# Patient Record
Sex: Male | Born: 1945
Health system: Southern US, Community
[De-identification: ages and names within clinical notes are randomized; demographics above are authoritative.]

## PROBLEM LIST (undated history)

## (undated) DIAGNOSIS — M1990 Unspecified osteoarthritis, unspecified site: Secondary | ICD-10-CM

## (undated) DIAGNOSIS — N183 Chronic kidney disease, stage 3 unspecified: Secondary | ICD-10-CM

## (undated) DIAGNOSIS — G709 Myoneural disorder, unspecified: Secondary | ICD-10-CM

## (undated) DIAGNOSIS — I639 Cerebral infarction, unspecified: Secondary | ICD-10-CM

## (undated) DIAGNOSIS — I2510 Atherosclerotic heart disease of native coronary artery without angina pectoris: Secondary | ICD-10-CM

## (undated) DIAGNOSIS — I219 Acute myocardial infarction, unspecified: Secondary | ICD-10-CM

## (undated) HISTORY — DX: Chronic kidney disease, stage 3 unspecified: N18.30

## (undated) HISTORY — DX: Atherosclerotic heart disease of native coronary artery without angina pectoris: I25.10

## (undated) HISTORY — DX: Cerebral infarction, unspecified: I63.9

---

## 2007-07-15 ENCOUNTER — Emergency Department (HOSPITAL_COMMUNITY): Admission: EM | Admit: 2007-07-15 | Discharge: 2007-07-15 | Payer: Self-pay | Admitting: Emergency Medicine

## 2007-07-16 ENCOUNTER — Emergency Department (HOSPITAL_COMMUNITY): Admission: EM | Admit: 2007-07-16 | Discharge: 2007-07-16 | Payer: Self-pay | Admitting: Emergency Medicine

## 2010-01-15 ENCOUNTER — Emergency Department (HOSPITAL_BASED_OUTPATIENT_CLINIC_OR_DEPARTMENT_OTHER): Admission: EM | Admit: 2010-01-15 | Discharge: 2010-01-15 | Payer: Self-pay | Admitting: Emergency Medicine

## 2011-06-25 ENCOUNTER — Emergency Department (HOSPITAL_COMMUNITY)
Admission: EM | Admit: 2011-06-25 | Discharge: 2011-06-25 | Disposition: A | Discharge status: Home / Self Care | Payer: BC Managed Care – PPO | Attending: Emergency Medicine | Admitting: Emergency Medicine

## 2011-06-25 ENCOUNTER — Emergency Department (HOSPITAL_COMMUNITY): Payer: BC Managed Care – PPO

## 2011-06-25 DIAGNOSIS — R630 Anorexia: Secondary | ICD-10-CM | POA: Insufficient documentation

## 2011-06-25 DIAGNOSIS — R6883 Chills (without fever): Secondary | ICD-10-CM | POA: Insufficient documentation

## 2011-06-25 DIAGNOSIS — N23 Unspecified renal colic: Secondary | ICD-10-CM | POA: Insufficient documentation

## 2011-06-25 DIAGNOSIS — R1032 Left lower quadrant pain: Secondary | ICD-10-CM | POA: Insufficient documentation

## 2011-06-25 DIAGNOSIS — R112 Nausea with vomiting, unspecified: Secondary | ICD-10-CM | POA: Insufficient documentation

## 2011-06-25 DIAGNOSIS — N1330 Unspecified hydronephrosis: Secondary | ICD-10-CM | POA: Insufficient documentation

## 2011-06-25 DIAGNOSIS — M549 Dorsalgia, unspecified: Secondary | ICD-10-CM | POA: Insufficient documentation

## 2011-06-25 LAB — POCT I-STAT, CHEM 8
BUN: 26 mg/dL — ABNORMAL HIGH (ref 6–23)
Glucose, Bld: 121 mg/dL — ABNORMAL HIGH (ref 70–99)
HCT: 44 % (ref 39.0–52.0)
Hemoglobin: 15 g/dL (ref 13.0–17.0)
Potassium: 4.3 mEq/L (ref 3.5–5.1)
Sodium: 137 mEq/L (ref 135–145)

## 2011-06-25 LAB — DIFFERENTIAL
Basophils Absolute: 0 10*3/uL (ref 0.0–0.1)
Eosinophils Absolute: 0 10*3/uL (ref 0.0–0.7)
Lymphs Abs: 1 10*3/uL (ref 0.7–4.0)
Monocytes Absolute: 1.1 10*3/uL — ABNORMAL HIGH (ref 0.1–1.0)
Monocytes Relative: 9 % (ref 3–12)

## 2011-06-25 LAB — CBC
HCT: 41.8 % (ref 39.0–52.0)
Hemoglobin: 14.7 g/dL (ref 13.0–17.0)
MCV: 85.5 fL (ref 78.0–100.0)
Platelets: 164 10*3/uL (ref 150–400)
RDW: 12.5 % (ref 11.5–15.5)
WBC: 11.6 10*3/uL — ABNORMAL HIGH (ref 4.0–10.5)

## 2011-06-25 LAB — URINALYSIS, ROUTINE W REFLEX MICROSCOPIC
Glucose, UA: NEGATIVE mg/dL
Ketones, ur: NEGATIVE mg/dL
Protein, ur: 30 mg/dL — AB
Specific Gravity, Urine: 1.016 (ref 1.005–1.030)
pH: 5.5 (ref 5.0–8.0)

## 2011-06-25 LAB — URINE MICROSCOPIC-ADD ON

## 2011-07-11 LAB — CULTURE, BLOOD (ROUTINE X 2): Culture: NO GROWTH

## 2011-07-11 LAB — DIFFERENTIAL
Basophils Absolute: 0
Basophils Relative: 0
Eosinophils Absolute: 0
Eosinophils Relative: 0
Monocytes Relative: 11
Neutro Abs: 11.1 — ABNORMAL HIGH

## 2011-07-11 LAB — COMPREHENSIVE METABOLIC PANEL
ALT: 19
AST: 19
Albumin: 3.5
CO2: 28
Chloride: 97
Creatinine, Ser: 1.22
GFR calc non Af Amer: >60
Glucose, Bld: 156 — ABNORMAL HIGH

## 2011-07-11 LAB — CBC
HCT: 42.3
MCHC: 34.3
WBC: 13.8 — ABNORMAL HIGH

## 2011-07-12 LAB — DIFFERENTIAL
Basophils Absolute: 0
Basophils Relative: 0
Eosinophils Absolute: 0.1
Eosinophils Relative: 1
Lymphocytes Relative: 7 — ABNORMAL LOW
Lymphs Abs: 0.8
Monocytes Absolute: 1 — ABNORMAL HIGH
Monocytes Relative: 9
Neutrophils Relative %: 84 — ABNORMAL HIGH

## 2011-07-12 LAB — CBC
HCT: 46.4
Hemoglobin: 15.9
RBC: 5.36
RDW: 12.5
WBC: 11.2 — ABNORMAL HIGH

## 2017-09-16 ENCOUNTER — Ambulatory Visit (INDEPENDENT_AMBULATORY_CARE_PROVIDER_SITE_OTHER): Payer: BLUE CROSS/BLUE SHIELD | Admitting: Family Medicine

## 2017-09-16 ENCOUNTER — Encounter: Payer: Self-pay | Admitting: Family Medicine

## 2017-09-16 VITALS — BP 128/80 | Temp 97.7°F | Ht 67.0 in | Wt 174.4 lb

## 2017-09-16 DIAGNOSIS — Z23 Encounter for immunization: Secondary | ICD-10-CM | POA: Diagnosis not present

## 2017-09-16 DIAGNOSIS — G540 Brachial plexus disorders: Secondary | ICD-10-CM

## 2017-09-16 NOTE — Progress Notes (Signed)
Subjective:  Patient ID: Ian Ramos, male    DOB: July 04, 1946  Age: 71 y.o. MRN: [id number]  CC: Establish Care   HPI Ian Ramos presents for 1 mos ho weakness in his left hand. "I cannot tell which way my fingers are going and my hand gets cold. All of my fingers are cold."  Patient feels numbness and tingling in all fingers of his left hand.  Patient is right-hand dominant.  He denies an injury to his neck shoulder arm or hand.  He denies neck or shoulder pain.  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  He does heavy work in both places.  He is accompanied by his girlfriend.  He has not seen a doctor in many years.  Chart review shows that he had a forniceal rupture many years ago that was documented by CT scan.  History of renal lithiasis.  History Jama has no past medical history on Ramos.   He has no past surgical history on Ramos.   His family history is not on Ramos.He reports that  has never smoked. His smokeless tobacco use includes chew. He reports that he does not drink alcohol or use drugs.  No outpatient medications prior to visit.   No facility-administered medications prior to visit.     ROS Review of Systems  Constitutional: Negative for chills, fatigue and fever.  HENT: Negative.   Eyes: Negative for photophobia and visual disturbance.  Respiratory: Negative for cough, chest tightness and shortness of breath.   Cardiovascular: Negative for chest pain and palpitations.  Gastrointestinal: Negative.   Endocrine: Negative for polyphagia and polyuria.  Genitourinary: Negative for decreased urine volume, frequency and hematuria.  Musculoskeletal: Negative for arthralgias, back pain, myalgias, neck pain and neck stiffness.  Skin: Negative.  Negative for rash.  Neurological: Positive for weakness and numbness. Negative for dizziness, facial asymmetry, light-headedness and headaches.    Hematological: Does not bruise/bleed easily.    Objective:  BP 128/80 (BP Location: Left Arm, Patient Position: Sitting, Cuff Size: Normal)   Temp 97.7 F (36.5 C) (Oral)   Ht 5\' 7"  (1.702 m)   Wt 174 lb 6 oz (79.1 kg)   SpO2 100%   BMI 27.31 kg/m   Physical Exam  Constitutional: He is oriented to person, place, and time. He appears well-developed and well-nourished. No distress.  HENT:  Head: Normocephalic and atraumatic.  Right Ear: External ear normal.  Left Ear: External ear normal.  Mouth/Throat: Oropharynx is clear and moist.  Eyes: Conjunctivae and EOM are normal. Pupils are equal, round, and reactive to light. Right eye exhibits no discharge. Left eye exhibits no discharge. No scleral icterus.  Neck: Neck supple. No JVD present. No tracheal deviation present. No thyromegaly present.  Cardiovascular: Normal rate, regular rhythm and normal heart sounds.  Pulses:      Radial pulses are 2+ on the left side.  Pulmonary/Chest: Effort normal and breath sounds normal. No stridor.  Musculoskeletal:       Right shoulder: Normal.       Left shoulder: He exhibits decreased range of motion. He exhibits no tenderness, no bony tenderness, no swelling and no deformity.       Cervical back: He exhibits normal range of motion, no tenderness, no bony tenderness and no deformity.       Arms:      Left hand: Decreased strength  noted. He exhibits finger abduction, thumb/finger opposition and wrist extension trouble.  Lymphadenopathy:    He has no cervical adenopathy.  Neurological: He is alert and oriented to person, place, and time.  Skin: Skin is warm and dry. He is not diaphoretic.  Psychiatric: He has a normal mood and affect. His behavior is normal.      Assessment & Plan:   Ian Ramos for establish care.  Diagnoses and all orders for this visit:  Need for influenza vaccination -     Flu vaccine HIGH DOSE PF  Need for pneumococcal vaccination -     Pneumococcal  polysaccharide vaccine 23-valent greater than or equal to 2yo subcutaneous/IM  Brachial plexopathy -     Ambulatory referral to Sports Medicine   Ian Ramos.  No orders of the defined types were placed in this encounter.    Follow-up: No Follow-up on Ramos.  Ian SaxWilliam Alfred Kremer, MD

## 2017-09-20 ENCOUNTER — Ambulatory Visit (INDEPENDENT_AMBULATORY_CARE_PROVIDER_SITE_OTHER): Payer: BLUE CROSS/BLUE SHIELD | Admitting: Family Medicine

## 2017-09-20 ENCOUNTER — Encounter: Payer: Self-pay | Admitting: Family Medicine

## 2017-09-20 ENCOUNTER — Ambulatory Visit (INDEPENDENT_AMBULATORY_CARE_PROVIDER_SITE_OTHER): Payer: BLUE CROSS/BLUE SHIELD

## 2017-09-20 VITALS — BP 128/72 | HR 66 | Temp 98.3°F | Ht 67.0 in | Wt 174.0 lb

## 2017-09-20 DIAGNOSIS — R29898 Other symptoms and signs involving the musculoskeletal system: Secondary | ICD-10-CM | POA: Diagnosis not present

## 2017-09-20 DIAGNOSIS — M19012 Primary osteoarthritis, left shoulder: Secondary | ICD-10-CM | POA: Diagnosis not present

## 2017-09-20 MED ORDER — PREDNISONE 5 MG PO TABS: ORAL_TABLET | ORAL | 0 refills | Status: DC

## 2017-09-20 NOTE — Assessment & Plan Note (Signed)
Unclear if he has brachial plexus problem or more central location. Doesn't appear to be a ganglion cyst anywhere on [place]. He has no pain and had no injury as a source. Rotator cuff is intact on [place]. Thoracic outlet syndrome would be odd but possible.  - XRay  - prednisone  - nerve conduction study  - if unrevealing then would consider either an MRI of cervical spine or of the brachial plexus  - may need to consider ESR, CRP, HIV, lyme titers, a1c

## 2017-09-20 NOTE — Patient Instructions (Signed)
Please see how the prednisone and let me know how it does for you  I will see what the nerve conduction study shows and we'll go from there.

## 2017-09-20 NOTE — Progress Notes (Signed)
Ian Ramos - 71 y.o. male MRN [id number]  Date of birth: 15-Dec-1945  SUBJECTIVE:  Including CC & ROS.  Chief Complaint  Patient presents with  . Left hand weakness    Ian Ramos is a 71 y.o. male that is presenting with left hand and arm weakness. Has been ongoing for two months. Denies previous injury or surgeries to the area. Admits to decrease range of motion in his arm. Has been having difficulties grasping objects in his hand. His symptoms have seemed to stay the same as of late. 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 There is no pain. His symptoms started with an abnormal sensation in his first finger and thumb where he had weakness with his pinching grasp. Denies any family history of autoimmune disease. Does not take any medications regularly. He cannot abduct or flex his arm. He denies any neck problems. No trouble breathing. No chest surgeries.    Review of Systems  Constitutional: Negative for fever.  Respiratory: Negative for shortness of breath.   Cardiovascular: Negative for chest pain.  Musculoskeletal: Negative for gait problem and myalgias.  Skin: Negative for color change.  Neurological: Positive for weakness and numbness.  Hematological: Negative for adenopathy.  Psychiatric/Behavioral: Negative for agitation.    HISTORY: Past Medical, Surgical, Social, and Family History Reviewed & Updated per EMR.   Pertinent Historical Findings include:  No past medical history on file.  No past surgical history on file.  No Known Allergies  No family history on file.   Social History   Socioeconomic History  . Marital status: Divorced    Spouse name: Not on file  . Number of children: Not on file  . Years of education: Not on file  . Highest education level: Not on file  Social Needs  . Financial resource strain: Not on file  . Food insecurity - worry: Not on file  . Food insecurity - inability: Not on  file  . Transportation needs - medical: Not on file  . Transportation needs - non-medical: Not on file  Occupational History  . Not on file  Tobacco Use  . Smoking status: Never Smoker  . Smokeless tobacco: Current User    Types: Chew  Substance and Sexual Activity  . Alcohol use: No    Frequency: Never  . Drug use: No  . Sexual activity: Not on file  Other Topics Concern  . Not on file  Social History Narrative  . Not on file     PHYSICAL EXAM:  VS: BP 128/72 (BP Location: Right Arm, Patient Position: Sitting, Cuff Size: Normal)   Pulse 66   Temp 98.3 F (36.8 C) (Oral)   Ht _0  (1.702 m)   Wt 174 lb (78.9 kg)   SpO2 100%   BMI 27.25 kg/m  Physical Exam Gen: NAD, alert, cooperative with exam, well-appearing ENT: normal lips, normal nasal mucosa,  Eye: normal EOM, normal conjunctiva and lids CV:  no edema, +2 pedal pulses   Resp: no accessory muscle use, non-labored,  GI: no masses or tenderness, no hernia  Skin: no rashes, no areas of induration  Neuro: normal tone, normal sensation to touch Psych:  normal insight, alert and oriented MSK:  Left shoulder:  Unable to actively abduct or flex the shoulder past 30 degrees Passively able to achieve full abduction and flexion  Normal ER  Weakness with IR and ER to resistance  Unable to  hold his arm at 90 degrees to test rotator cuff  No area that is tender to palpation  Weakness with grip strength and pincer grasp No signs of atrophy  Normal elbow ROM  Normal strength to resistance with elbow flexion and extension   Limited ultrasound: left shoulder:  No signs of a ganglion in the suprascapular notch  No abnormal mass observed in the axilla  Normal supraspinatus  Subscapularis with calcification but no tear   Summary: chronic changes of humeral head but calcification of subscap  Ultrasound and interpretation by Clearance Coots, MD          ASSESSMENT & PLAN:   Left arm weakness Unclear if he has  brachial plexus problem or more central location. Doesn't appear to be a ganglion cyst anywhere on [place]. He has no pain and had no injury as a source. Rotator cuff is intact on [place]. Thoracic outlet syndrome would be odd but possible.  - XRay  - prednisone  - nerve conduction study  - if unrevealing then would consider either an MRI of cervical spine or of the brachial plexus  - may need to consider ESR, CRP, HIV, lyme titers, a1c

## 2017-09-23 ENCOUNTER — Telehealth: Payer: Self-pay | Admitting: Family Medicine

## 2017-09-23 NOTE — Telephone Encounter (Signed)
Left VM for patient. If he calls back please have him speak with a nurse/CMA and inform that his xray results are normal.   If any questions then please take the best time and phone number to call and I will try to call him back.   Myra Rude, MD [facility] 09/23/2017, 8:39 AM

## 2017-10-03 ENCOUNTER — Telehealth: Payer: Self-pay | Admitting: Family Medicine

## 2017-10-03 NOTE — Telephone Encounter (Signed)
Copied from CRM #[id number]. Topic: Referral - Status >> Oct 03, 2017 11:41 AM Stephannie Li, NT wrote: Reason for CRM: Patient called to follow for status of referral ,they waiting for a return call from Diane in regards , please advise, [phone]  >> Oct 03, 2017  1:14 PM Marcha Solders D wrote: Returned call and informed patient referral was sent to [facility] and is being reviewed by there office. Informed they will call patient directly to schedule appointment. I also gave phone number for [facility].

## 2017-10-10 ENCOUNTER — Ambulatory Visit (INDEPENDENT_AMBULATORY_CARE_PROVIDER_SITE_OTHER): Payer: BLUE CROSS/BLUE SHIELD | Admitting: Diagnostic Neuroimaging

## 2017-10-10 ENCOUNTER — Encounter (INDEPENDENT_AMBULATORY_CARE_PROVIDER_SITE_OTHER): Payer: BLUE CROSS/BLUE SHIELD | Admitting: Diagnostic Neuroimaging

## 2017-10-10 DIAGNOSIS — R29898 Other symptoms and signs involving the musculoskeletal system: Secondary | ICD-10-CM

## 2017-10-10 DIAGNOSIS — Z0289 Encounter for other administrative examinations: Secondary | ICD-10-CM

## 2017-10-11 NOTE — Procedures (Signed)
[facility]  NCS (NERVE CONDUCTION STUDY) WITH EMG (ELECTROMYOGRAPHY) REPORT   STUDY DATE: 10/10/17 PATIENT NAME: Ian Ramos DOB: Feb 18, 1946 MRN: [id number]  ORDERING CLINICIAN: Daphene Jaeger, MD  TECHNOLOGIST: Charlesetta Ivory ELECTROMYOGRAPHER: Glenford Bayley. Penumalli, MD  CLINICAL INFORMATION: 72 year old male with left arm weakness and left hand numbness for past 3 months.  Patient notes weakness in raising his arm overhead.  He is also having left grip weakness.  Also numbness and tingling in his fingertips.  Neurologic exam notable for weakness in left deltoid 3/5, left triceps 3/5, left hand finger abduction 3/5, with fasciculations in left triceps.  Upper extremity reflexes are 2+ except 3+ at left triceps.  Lower extremity reflexes 1+.  Sensory exam notable for slightly decreased sensation in left hand.  Evaluate for brachial plexopathy, cervical radiculopathy or peripheral neuropathy.   FINDINGS: NERVE CONDUCTION STUDY: Left median motor response has prolonged distal latency (6.0 ms), decreased amplitude, normal conduction velocity.  Right median motor response has prolonged distal latency (7.2 ms) decreased amplitude, slightly slow conduction velocity.  Right ulnar motor response and F wave latency normal.  Left ulnar motor response notable for normal distal latency, mildly decreased amplitude, slow conduction velocity with stimulation above the elbow (43 m/s) normal conduction velocity with stim elation below the elbow (55 m/s).  Right median sensory response has prolonged peak latency and decreased amplitude.  Left median sensory response could not be obtained.  Bilateral ulnar sensory responses are normal.   NEEDLE ELECTROMYOGRAPHY:  Needle examination of left upper extremity notable for left deltoid and left infraspinatus muscles have no abnormal spontaneous activity at rest and decreased recruitment of large motor units on exertion.  Left cervical  paraspinal muscles, left biceps, left triceps, left flexor carpi radialis, left first dorsal interosseous and left supraspinatus are unremarkable.   IMPRESSION:   Abnormal study demonstrating: 1.  Chronic denervation in left deltoid and left infraspinatus muscles.  Remainder of nerve conduction study and needle EMG did not further localize.  Could be related to underlying cervical radiculopathy (C5, C6) or upper trunk brachial plexopathy.  Consider follow-up imaging with MRI of cervical spine and brachial plexus. 2.  Bilateral median neuropathies at the wrist consistent with bilateral carpal tunnel syndrome.  This is worse on the left compared to right side.    INTERPRETING PHYSICIAN:  Suanne Marker, MD Certified in Neurology, Neurophysiology and Neuroimaging  [facility] [address] [place], [place] [address] [phone]  [facility]    Nerve / Sites Muscle Latency Ref. Amplitude Ref. Rel Amp Segments Distance Velocity Ref. Area    ms ms mV mV %  cm m/s m/s mVms  R Median - APB     Wrist APB 7.2 ?4.4 2.0 ?4.0 100 Wrist - APB 7   8.0     Upper arm APB 11.4  1.9  91 Upper arm - Wrist 20 47 ?49 7.1  L Median - APB     Wrist APB 6.0 ?4.4 2.1 ?4.0 100 Wrist - APB 7   7.6     Upper arm APB 9.9  2.0  94.1 Upper arm - Wrist 20 51 ?49 7.3  R Ulnar - ADM     Wrist ADM 3.0 ?3.3 7.3 ?6.0 100 Wrist - ADM 7   17.5     B.Elbow ADM 5.6  6.7  91.5 B.Elbow - Wrist 16 60 ?49 16.7     A.Elbow ADM 7.9  6.7  100 A.Elbow - B.Elbow  12 52 ?49 17.5         A.Elbow - Wrist      L Ulnar - ADM     Wrist ADM 2.7 ?3.3 5.6 ?6.0 100 Wrist - ADM 7   10.9     B.Elbow ADM 6.0  5.8  104 B.Elbow - Wrist 18 55 ?49 14.7     A.Elbow ADM 8.3  4.9  84.2 A.Elbow - B.Elbow 10 43 ?49 13.6         A.Elbow - Wrist                 SNC    Nerve / Sites Rec. Site Peak Lat Ref.  Amp Ref. Segments Distance    ms ms V V  cm  R Median - Orthodromic (Dig II, Mid palm)     Dig II Wrist 4.8 ?3.4 3  ?10 Dig II - Wrist 13  L Median - Orthodromic (Dig II, Mid palm)     Dig II Wrist NR ?3.4 NR ?10 Dig II - Wrist 13  R Ulnar - Orthodromic, (Dig V, Mid palm)     Dig V Wrist 2.7 ?3.1 6 ?5 Dig V - Wrist 11  L Ulnar - Orthodromic, (Dig V, Mid palm)     Dig V Wrist 2.3 ?3.1 10 ?5 Dig V - Wrist 34              F  Wave    Nerve F Lat Ref.   ms ms  R Ulnar - ADM 28.6 ?32.0  L Ulnar - ADM 30.3 ?32.0         EMG full       EMG Summary Table    Spontaneous MUAP Recruitment  Muscle IA Fib PSW Fasc Other Amp Dur. Poly Pattern  L. Biceps brachii Normal None None None _______ Normal Normal Normal Normal  L. Triceps brachii Normal None None None _______ Normal Normal Normal Normal  L. Flexor carpi radialis Normal None None None _______ Normal Normal Normal Normal  L. First dorsal interosseous Normal None None None _______ Normal Normal Normal Normal  L. Deltoid Normal None None None _______ Increased Normal Normal Reduced  L. Supraspinatus Normal None None None _______ Normal Normal Normal Normal  L. Infraspinatus Normal None None None _______ Increased Normal Normal Reduced  L. Cervical paraspinals Normal None None None _______ Normal Normal Normal Normal

## 2017-10-14 ENCOUNTER — Telehealth: Payer: Self-pay | Admitting: Family Medicine

## 2017-10-14 NOTE — Telephone Encounter (Signed)
Left VM for patient. If he calls back please have him speak with a nurse/CMA and inform that his EMG suggested there could be a problem in his neck or at the brachial plexus. We could get an MRI to help with this.   If any questions then please take the best time and phone number to call and I will try to call him back.   Myra Rude, MD [facility] 10/14/2017, 8:46 AM

## 2017-10-14 NOTE — Telephone Encounter (Signed)
-----   Message from Suanne Marker, MD sent at 10/11/2017  4:53 PM EST ----- Thank you for referring this interesting and pleasant patient.  I have included my electrodiagnostic report and please feel free to contact me with any questions or concerns.  Sincerely,  Suanne Marker, MD 10/11/2017, 4:53 PM Certified in Neurology, Neurophysiology and Neuroimaging  [facility] [address] [place], [place] [address] [phone]

## 2017-10-17 NOTE — Telephone Encounter (Addendum)
Caller name: Melida Quitter Relation to pt: spouse  Call back number: [phone]  Reason for call:  Wife checking on the status of MRI orders, please advise location and when orders are placed.

## 2017-10-22 ENCOUNTER — Other Ambulatory Visit: Payer: Self-pay | Admitting: Family Medicine

## 2017-10-22 NOTE — Telephone Encounter (Signed)
Spoke with patient's wife about EMG results and MRI. Will order a MRI brachial plexus with and without to evaluate for possible thoracic outlet vs brachial plexus plexopathy.   Myra Rude, MD [facility] 10/22/2017, 5:23 PM

## 2017-10-22 NOTE — Telephone Encounter (Signed)
Wife of pt called to check the status of the mri, pt's arm is worsening, contact to advise if needed, call back [phone]

## 2017-10-23 ENCOUNTER — Other Ambulatory Visit: Payer: Self-pay | Admitting: Family Medicine

## 2017-10-23 DIAGNOSIS — G540 Brachial plexus disorders: Secondary | ICD-10-CM

## 2017-11-07 ENCOUNTER — Telehealth: Payer: Self-pay | Admitting: Family Medicine

## 2017-11-07 NOTE — Telephone Encounter (Signed)
Ian Ramos called request to know more information CT chest. She wants to know why CT chest was order when the pt is having shoulder and neck issue. Please call Ian Ramos back. [phone]

## 2017-11-07 NOTE — Telephone Encounter (Signed)
Spoke with patient's wife, notified the reason for the MRI and location. Verbalized understanding.

## 2017-11-14 ENCOUNTER — Telehealth: Payer: Self-pay | Admitting: Family Medicine

## 2017-11-14 ENCOUNTER — Ambulatory Visit
Admission: RE | Admit: 2017-11-14 | Discharge: 2017-11-14 | Disposition: A | Discharge status: Home / Self Care | Payer: BLUE CROSS/BLUE SHIELD | Source: Ambulatory Visit | Attending: Family Medicine | Admitting: Family Medicine

## 2017-11-14 DIAGNOSIS — G540 Brachial plexus disorders: Secondary | ICD-10-CM

## 2017-11-14 NOTE — Telephone Encounter (Signed)
Copied from CRM #[id number]. Topic: Quick Communication - See Telephone Encounter >> Nov 14, 2017  4:39 PM Arlyss Gandy, NT wrote: CRM for notification. See Telephone encounter for: Pt went to have MRI done and was unable to do so do to being so claustrophobic with the thing on his face and he felt as if he couldn't breathe. The MRI imaging place told them to contact Dr. Jordan Likes to see if he could order something for him to take prior to the MRI to help. Please advise. Pt states that the doctor may speak with Kendal Hymen.   11/14/17.

## 2017-11-18 MED ORDER — DIAZEPAM 5 MG PO TABS: ORAL_TABLET | ORAL | 0 refills | Status: DC

## 2017-11-18 NOTE — Telephone Encounter (Signed)
Spoke with patient's wife about his inability to tolerate the MRI. We will try Valium for this. If unable to tolerate with Valium the knee may need to be scheduled in an open MRI.  Myra Rude, MD [facility] 11/18/2017, 10:28 AM

## 2017-11-19 ENCOUNTER — Other Ambulatory Visit: Payer: Self-pay | Admitting: Family Medicine

## 2017-11-19 DIAGNOSIS — G540 Brachial plexus disorders: Secondary | ICD-10-CM

## 2017-11-24 ENCOUNTER — Other Ambulatory Visit: Payer: BLUE CROSS/BLUE SHIELD

## 2017-11-24 ENCOUNTER — Ambulatory Visit
Admission: RE | Admit: 2017-11-24 | Discharge: 2017-11-24 | Disposition: A | Discharge status: Home / Self Care | Payer: BLUE CROSS/BLUE SHIELD | Source: Ambulatory Visit | Attending: Family Medicine | Admitting: Family Medicine

## 2017-11-24 DIAGNOSIS — G540 Brachial plexus disorders: Secondary | ICD-10-CM

## 2017-11-29 ENCOUNTER — Telehealth: Payer: Self-pay | Admitting: *Deleted

## 2017-11-29 DIAGNOSIS — G540 Brachial plexus disorders: Secondary | ICD-10-CM

## 2017-11-29 NOTE — Telephone Encounter (Signed)
Copied from CRM #[id number]. Topic: Quick Communication - See Telephone Encounter >> Nov 14, 2017  4:39 PM Arlyss Gandy, NT wrote: CRM for notification. See Telephone encounter for: Pt went to have MRI done and was unable to do so do to being so claustrophobic with the thing on his face and he felt as if he couldn't breathe. The MRI imaging place told them to contact Dr. Jordan Likes to see if he could order something for him to take prior to the MRI to help. Please advise. Pt states that the doctor may speak with Kendal Hymen.   11/14/17. >> Nov 28, 2017  5:05 PM Floria Raveling A wrote: Pt wife called in and said that pt was give meds to help with MRI but they did not do anything.  Pt was unable to do the MRI again on the 11/24/2017.  She wants to know what is the next step to get this MRI done  Best number [phone]

## 2017-12-02 NOTE — Telephone Encounter (Signed)
Spoke with patient's wife about not being able to complete an MRI. Tried valium and was unsuccessful. Will refer to neurosurgery and they can work up if they believe his symptoms are more neck related or brachial plexus in origin.   Myra Rude, MD [facility] 12/02/2017, 1:44 PM

## 2017-12-06 NOTE — Telephone Encounter (Signed)
°  Pt. Called about referral. Asked to re-send to [facility].  Wife is wanting to get appt. Scheduled.

## 2017-12-09 ENCOUNTER — Telehealth: Payer: Self-pay | Admitting: Family Medicine

## 2017-12-09 NOTE — Telephone Encounter (Signed)
I returned call to [facility]. I was informed per there provider patient needs to have MRI before proceeding with referral. I informed her that patient has tried on several occasions to have MRI done and was unable to have it done even with medication. I was informed patient may need to have it done at [facility] under sedation. I have no knowledge of this being done for a MRI. Please advise.

## 2017-12-09 NOTE — Telephone Encounter (Signed)
Copied from CRM [id number]. Topic: General - Other >> Dec 09, 2017  3:23 PM Gerrianne Scale wrote: Reason for CRM: Rene Kocher from [facility] [phone] calling stating that she need Ian Ramos to give her a call about a referral she states that the Doctor want patient to have a MRI of cervical spine she asked that when you call press 0 and ask for her

## 2017-12-12 NOTE — Telephone Encounter (Signed)
Received phone call from [facility], calling to find out the status about MRI. Dr. Jordan Likes has agreed to see patient, but is requesting patient get MRI done before coming into office. Please advise.

## 2017-12-13 NOTE — Telephone Encounter (Signed)
Spoke with patient's wife about next course of action. Will order MRI cervical spine without contrast and an MRI chest with and without contrast with focus on the brachial plexus. He will need to be sedated for this to be accomplished.   Myra Rude, MD [facility] 12/13/2017, 3:26 PM

## 2017-12-16 ENCOUNTER — Other Ambulatory Visit: Payer: Self-pay | Admitting: *Deleted

## 2017-12-16 ENCOUNTER — Other Ambulatory Visit: Payer: Self-pay | Admitting: Family Medicine

## 2017-12-16 DIAGNOSIS — M5412 Radiculopathy, cervical region: Secondary | ICD-10-CM

## 2017-12-16 DIAGNOSIS — G540 Brachial plexus disorders: Secondary | ICD-10-CM

## 2017-12-17 ENCOUNTER — Telehealth: Payer: Self-pay

## 2017-12-17 NOTE — Telephone Encounter (Signed)
MW-[id number] see message below/thx dmf  Copied from CRM [id number]. Topic: Inquiry >> Dec 17, 2017  3:58 PM Alexander Bergeron B wrote: Reason for CRM: Rene Kocher from [facility] called for [facility] contact [phone] option [phone] ask for [facility]

## 2017-12-18 ENCOUNTER — Telehealth: Payer: Self-pay

## 2017-12-18 NOTE — Telephone Encounter (Signed)
Patient will have to come in and see Dr. Doreene Burke in order for the form to be filled out for his MRI. Please help him to get on the schedule.   Thank you!

## 2017-12-18 NOTE — Telephone Encounter (Signed)
I call and left message on [facility] voicemail to return my call.

## 2017-12-19 NOTE — Telephone Encounter (Signed)
I called and left a generic message on voicemail to call office and schedule appointment with Dr. Doreene Burke per Dr. Doreene Burke.

## 2017-12-19 NOTE — Telephone Encounter (Signed)
Ian Ramos returned my call on 12/18/17. Ian Ramos informed that MRI with sedation has been scheduled and she informed me the appointment to see Dr. Jordan Likes has been moved to a later date so will have the results of the MRI. Patient aware of new appointment date and time per [facility].

## 2017-12-20 ENCOUNTER — Telehealth: Payer: Self-pay

## 2017-12-20 NOTE — Telephone Encounter (Signed)
Copied from CRM [id number]. Topic: Quick Communication - Office Called Patient >> Dec 19, 2017  1:35 PM Marcha Solders D wrote: I called and left a generic message on voicemail to call office and schedule appointment with Dr. Doreene Burke per Dr. Doreene Burke. *PEC - Patient needs to schedule appointment to see Dr. Doreene Burke to complete form received for his MRI that is scheduled. Okay to give patient this information and schedule appointment to see Dr.Kremer.   >> Dec 20, 2017 12:15 PM Alexander Bergeron B wrote: Pt's wife called to speak w/ Mardella Layman, pt is aware of yesterdays message they will call to schedule appt w/ Doreene Burke but are getting other things in order, contact pt to advise if needed, pt is wanting to speak w/ Mardella Layman

## 2017-12-20 NOTE — Telephone Encounter (Signed)
See other phone note. Patient is aware he needs to make an appointment with Dr. Doreene Burke to get clearance form filled out for his MRI. They are trying to get other things in order before making this appointment.

## 2017-12-23 MED ORDER — DIAZEPAM 5 MG PO TABS: ORAL_TABLET | ORAL | 0 refills | Status: DC

## 2017-12-23 NOTE — Telephone Encounter (Signed)
Spoke to pt, due to insurance reasons we are going to cancel the MRI @ [facility] so the pt will not need to schedule an appt with Dr. Doreene Burke.  Pt is going to schedule an appt at [facility].

## 2017-12-23 NOTE — Telephone Encounter (Signed)
Will send in valium to try obtaining an MRI without having to be put to sleep.   Myra Rude, MD [facility] 12/23/2017, 4:39 PM

## 2017-12-23 NOTE — Addendum Note (Signed)
Addended by: Myra Rude on: 12/23/2017 04:42 PM   Modules accepted: Orders

## 2017-12-31 ENCOUNTER — Telehealth: Payer: Self-pay | Admitting: Family Medicine

## 2017-12-31 DIAGNOSIS — G540 Brachial plexus disorders: Secondary | ICD-10-CM

## 2017-12-31 NOTE — Telephone Encounter (Signed)
Copied from CRM [id number]. Topic: Referral - Request >> Dec 30, 2017  2:22 PM Crist Infante wrote: Reason for CRM: Carollee Herter with [facility] requesting the order for the MR CERVICAL SPINE WO CONTRAST Be sent to them.  They received the mr chest , but not this one. Please fax the order to [phone]  Placed by Dr Beecher Mcardle called to check up on order - pt has appt tomorrow.  Please call [phone] ext [phone]

## 2018-01-01 ENCOUNTER — Encounter: Payer: Self-pay | Admitting: Family Medicine

## 2018-01-01 ENCOUNTER — Telehealth: Payer: Self-pay | Admitting: Family Medicine

## 2018-01-01 NOTE — Addendum Note (Signed)
Addended by: Milas Kocher A on: 01/01/2018 02:45 PM   Modules accepted: Orders

## 2018-01-01 NOTE — Telephone Encounter (Signed)
rec'd call from Radiology with a critical report for Dr. Jordan Likes.  Requested to have the Radiologist speak with Dr. Jordan Likes.    Call placed to [facility], spoke with Flow Coordinator.  Was advised that Dr. Ashley Royalty will speak with Radiologist re: critical result.

## 2018-01-01 NOTE — Telephone Encounter (Addendum)
Fax resent to [facility] yesterday.  Spoke with Kirt Boys at [facility] needed new order MRI Cervical W WO order faxed [phone]

## 2018-01-02 ENCOUNTER — Telehealth: Payer: Self-pay | Admitting: Family Medicine

## 2018-01-02 NOTE — Telephone Encounter (Signed)
Spoke with patient's girlfriend. Informed her of the MRI results. They have an appt scheduled next week for neurosurgery.   Myra Rude, MD [facility] Primary Care & Sports Medicine 01/02/2018, 1:38 PM

## 2018-01-07 ENCOUNTER — Ambulatory Visit: Admit: 2018-01-07 | Payer: BLUE CROSS/BLUE SHIELD

## 2018-01-07 ENCOUNTER — Other Ambulatory Visit (HOSPITAL_COMMUNITY): Payer: BLUE CROSS/BLUE SHIELD

## 2018-01-07 ENCOUNTER — Ambulatory Visit (HOSPITAL_COMMUNITY): Payer: BLUE CROSS/BLUE SHIELD

## 2018-01-07 SURGERY — MRI WITH ANESTHESIA
Anesthesia: General

## 2018-05-06 ENCOUNTER — Other Ambulatory Visit: Payer: Self-pay | Admitting: Neurosurgery

## 2018-05-06 DIAGNOSIS — G959 Disease of spinal cord, unspecified: Secondary | ICD-10-CM | POA: Diagnosis not present

## 2018-05-15 NOTE — Pre-Procedure Instructions (Signed)
Ian Ramos  05/15/2018      [id number] [place], [place] - [address] AT [facility] [address] [place] [address] Phone: [phone] Fax: [phone]    Your first procedure is scheduled on Aug. 23, 2019 from 1:20PM-4:45PM              Your second procedure is scheduled on Aug. 26, 2019 from 8:00AM-11:15AM  Report to [facility] Admitting Entrance "A" at 11:30AM on Aug. 23  Call this number if you have problems the morning of surgery:  [phone]   Remember:  Do not eat or drink after midnight on Aug. 22nd    Take these medicines the morning of surgery with A SIP OF WATER: If needed Diazepam (VALIUM)   As of today, stop taking all Other Aspirin Products, Vitamins, Fish oils, and Herbal medications. Also stop all NSAIDS i.e. Advil, Ibuprofen, Motrin, Aleve, Anaprox, Naproxen, BC, Goody Powders, and all Supplements.    Do not wear jewelry.  Do not wear lotions, powders, colognes, or deodorant.  Do not shave 48 hours prior to surgery.  Men may shave face.  Do not bring valuables to the hospital.  [facility] is not responsible for any belongings or valuables.  Contacts, dentures or bridgework may not be worn into surgery.  Leave your suitcase in the car.  After surgery it may be brought to your room.  For patients admitted to the hospital, discharge time will be determined by your treatment team.  Patients discharged the day of surgery will not be allowed to drive home.   Special instructions:   [place]- Preparing For Surgery  Before surgery, you can play an important role. Because skin is not sterile, your skin needs to be as free of germs as possible. You can reduce the number of germs on your skin by washing with CHG (chlorahexidine gluconate) Soap before surgery.  CHG is an antiseptic cleaner which kills germs and bonds with the skin to continue killing germs even after washing.    Oral Hygiene  is also important to reduce your risk of infection.  Remember - BRUSH YOUR TEETH THE MORNING OF SURGERY WITH YOUR REGULAR TOOTHPASTE  Please do not use if you have an allergy to CHG or antibacterial soaps. If your skin becomes reddened/irritated stop using the CHG.  Do not shave (including legs and underarms) for at least 48 hours prior to first CHG shower. It is OK to shave your face.  Please follow these instructions carefully.   1. Shower the NIGHT BEFORE SURGERY and the MORNING OF SURGERY with CHG.   2. If you chose to wash your hair, wash your hair first as usual with your normal shampoo.  3. After you shampoo, rinse your hair and body thoroughly to remove the shampoo.  4. Use CHG as you would any other liquid soap. You can apply CHG directly to the skin and wash gently with a scrungie or a clean washcloth.   5. Apply the CHG Soap to your body ONLY FROM THE NECK DOWN.  Do not use on open wounds or open sores. Avoid contact with your eyes, ears, mouth and genitals (private parts). Wash Face and genitals (private parts)  with your normal soap.  6. Wash thoroughly, paying special attention to the area where your surgery will be performed.  7. Thoroughly rinse your body with warm water from the neck down.  8.  DO NOT shower/wash with your normal soap after using and rinsing off the CHG Soap.  9. Pat yourself dry with a CLEAN TOWEL.  10. Wear CLEAN PAJAMAS to bed the night before surgery, wear comfortable clothes the morning of surgery  11. Place CLEAN SHEETS on your bed the night of your first shower and DO NOT SLEEP WITH PETS.  Day of Surgery:  Do not apply any deodorants/lotions.  Please wear clean clothes to the hospital/surgery center.   Remember to brush your teeth WITH YOUR REGULAR TOOTHPASTE.  Please read over the following fact sheets that you were given. Pain Booklet, Coughing and Deep Breathing, MRSA Information and Surgical Site Infection Prevention

## 2018-05-16 ENCOUNTER — Encounter (HOSPITAL_COMMUNITY): Payer: Self-pay

## 2018-05-16 ENCOUNTER — Inpatient Hospital Stay (HOSPITAL_COMMUNITY)
Admission: RE | Admit: 2018-05-16 | Discharge: 2018-05-16 | Disposition: A | Discharge status: Home / Self Care | Payer: BLUE CROSS/BLUE SHIELD | Source: Ambulatory Visit

## 2018-05-16 ENCOUNTER — Encounter (HOSPITAL_COMMUNITY)
Admission: RE | Admit: 2018-05-16 | Discharge: 2018-05-16 | Disposition: A | Discharge status: Home / Self Care | Payer: BLUE CROSS/BLUE SHIELD | Source: Ambulatory Visit | Attending: Neurosurgery | Admitting: Neurosurgery

## 2018-05-16 DIAGNOSIS — M5412 Radiculopathy, cervical region: Secondary | ICD-10-CM | POA: Diagnosis not present

## 2018-05-16 DIAGNOSIS — Z01818 Encounter for other preprocedural examination: Secondary | ICD-10-CM | POA: Diagnosis not present

## 2018-05-16 HISTORY — DX: Unspecified osteoarthritis, unspecified site: M19.90

## 2018-05-16 HISTORY — DX: Myoneural disorder, unspecified: G70.9

## 2018-05-16 LAB — BASIC METABOLIC PANEL
ANION GAP: 9 (ref 5–15)
BUN: 22 mg/dL (ref 8–23)
CO2: 22 mmol/L (ref 22–32)
Calcium: 9.1 mg/dL (ref 8.9–10.3)
Chloride: 108 mmol/L (ref 98–111)
Creatinine, Ser: 1.65 mg/dL — ABNORMAL HIGH (ref 0.61–1.24)
GFR calc Af Amer: 46 mL/min — ABNORMAL LOW (ref >60)
GFR calc non Af Amer: 40 mL/min — ABNORMAL LOW (ref >60)
GLUCOSE: 110 mg/dL — AB (ref 70–99)
Potassium: 3.8 mmol/L (ref 3.5–5.1)
Sodium: 139 mmol/L (ref 135–145)

## 2018-05-16 LAB — TYPE AND SCREEN
ABO/RH(D): A POS
ANTIBODY SCREEN: NEGATIVE

## 2018-05-16 LAB — CBC WITH DIFFERENTIAL/PLATELET
ABS IMMATURE GRANULOCYTES: 0 10*3/uL (ref 0.0–0.1)
Basophils Absolute: 0 10*3/uL (ref 0.0–0.1)
Basophils Relative: 1 %
Eosinophils Absolute: 0.2 10*3/uL (ref 0.0–0.7)
Eosinophils Relative: 3 %
HCT: 40.9 % (ref 39.0–52.0)
Hemoglobin: 13.6 g/dL (ref 13.0–17.0)
Immature Granulocytes: 0 %
Lymphocytes Relative: 23 %
Lymphs Abs: 1.5 10*3/uL (ref 0.7–4.0)
MCH: 29.8 pg (ref 26.0–34.0)
MCHC: 33.3 g/dL (ref 30.0–36.0)
MCV: 89.5 fL (ref 78.0–100.0)
MONO ABS: 0.5 10*3/uL (ref 0.1–1.0)
MONOS PCT: 8 %
NEUTROS ABS: 4.1 10*3/uL (ref 1.7–7.7)
Neutrophils Relative %: 65 %
Platelets: 164 10*3/uL (ref 150–400)
RBC: 4.57 MIL/uL (ref 4.22–5.81)
RDW: 12.4 % (ref 11.5–15.5)
WBC: 6.4 10*3/uL (ref 4.0–10.5)

## 2018-05-16 LAB — ABO/RH: ABO/RH(D): A POS

## 2018-05-16 LAB — SURGICAL PCR SCREEN
MRSA, PCR: NEGATIVE
Staphylococcus aureus: NEGATIVE

## 2018-05-16 NOTE — Progress Notes (Signed)
PCP: Nadene Rubins, MD  Cardiologist:  EKG:  Stress test: denies  ECHO: denies  Cardiac Cath: denies  Chest x-ray: pt enies past year, no recent respiratory complications

## 2018-05-16 NOTE — Progress Notes (Signed)
Signed          PCP: Nadene Rubins, MD  Cardiologist: pt denies  EKG: pt denies past year  Stress test: denies  ECHO: denies  Cardiac Cath: denies  Chest x-ray: pt denies past year, no recent respiratory complications

## 2018-05-23 ENCOUNTER — Encounter (HOSPITAL_COMMUNITY): Payer: Self-pay | Admitting: *Deleted

## 2018-05-23 ENCOUNTER — Inpatient Hospital Stay (HOSPITAL_COMMUNITY): Payer: BLUE CROSS/BLUE SHIELD | Admitting: Certified Registered Nurse Anesthetist

## 2018-05-23 ENCOUNTER — Inpatient Hospital Stay (HOSPITAL_COMMUNITY): Payer: BLUE CROSS/BLUE SHIELD

## 2018-05-23 ENCOUNTER — Other Ambulatory Visit: Payer: Self-pay

## 2018-05-23 ENCOUNTER — Inpatient Hospital Stay (HOSPITAL_COMMUNITY)
Admission: RE | Disposition: A | Discharge status: Home / Self Care | Payer: Self-pay | Source: Home / Self Care | Attending: Neurosurgery

## 2018-05-23 ENCOUNTER — Inpatient Hospital Stay (HOSPITAL_COMMUNITY)
Admission: RE | Admit: 2018-05-23 | Discharge: 2018-05-25 | DRG: 472 | Disposition: A | Discharge status: Home / Self Care | Payer: BLUE CROSS/BLUE SHIELD | Attending: Neurosurgery | Admitting: Neurosurgery

## 2018-05-23 DIAGNOSIS — F17220 Nicotine dependence, chewing tobacco, uncomplicated: Secondary | ICD-10-CM | POA: Diagnosis present

## 2018-05-23 DIAGNOSIS — M50221 Other cervical disc displacement at C4-C5 level: Secondary | ICD-10-CM | POA: Diagnosis present

## 2018-05-23 DIAGNOSIS — G959 Disease of spinal cord, unspecified: Secondary | ICD-10-CM | POA: Diagnosis not present

## 2018-05-23 DIAGNOSIS — Z981 Arthrodesis status: Secondary | ICD-10-CM

## 2018-05-23 DIAGNOSIS — M4802 Spinal stenosis, cervical region: Secondary | ICD-10-CM | POA: Diagnosis present

## 2018-05-23 DIAGNOSIS — G992 Myelopathy in diseases classified elsewhere: Secondary | ICD-10-CM | POA: Diagnosis present

## 2018-05-23 DIAGNOSIS — R531 Weakness: Secondary | ICD-10-CM | POA: Diagnosis not present

## 2018-05-23 HISTORY — PX: POSTERIOR CERVICAL FUSION/FORAMINOTOMY: SHX5038

## 2018-05-23 SURGERY — POSTERIOR CERVICAL FUSION/FORAMINOTOMY LEVEL 5
Anesthesia: General

## 2018-05-23 MED ORDER — BISACODYL 10 MG RE SUPP: 10.0000 mg | Freq: Every day | RECTAL | Status: DC | PRN

## 2018-05-23 MED ORDER — FLEET ENEMA 7-19 GM/118ML RE ENEM: 1.0000 | ENEMA | Freq: Once | RECTAL | Status: DC | PRN

## 2018-05-23 MED ORDER — PHENOL 1.4 % MT LIQD: 1.0000 | OROMUCOSAL | Status: DC | PRN

## 2018-05-23 MED ORDER — ONDANSETRON HCL 4 MG/2ML IJ SOLN
INTRAMUSCULAR | Status: AC
  Filled 2018-05-23: qty 2

## 2018-05-23 MED ORDER — SODIUM CHLORIDE 0.9 % IV SOLN
INTRAVENOUS | Status: DC | PRN
  Administered 2018-05-23: 20 ug/min via INTRAVENOUS

## 2018-05-23 MED ORDER — 0.9 % SODIUM CHLORIDE (POUR BTL) OPTIME
TOPICAL | Status: DC | PRN
  Administered 2018-05-23: 1000 mL

## 2018-05-23 MED ORDER — POLYETHYLENE GLYCOL 3350 17 G PO PACK: 17.0000 g | PACK | Freq: Every day | ORAL | Status: DC | PRN

## 2018-05-23 MED ORDER — DEXAMETHASONE SODIUM PHOSPHATE 10 MG/ML IJ SOLN
10.0000 mg | INTRAMUSCULAR | Status: AC
  Administered 2018-05-23: 10 mg via INTRAVENOUS

## 2018-05-23 MED ORDER — ONDANSETRON HCL 4 MG/2ML IJ SOLN: 4.0000 mg | Freq: Once | INTRAMUSCULAR | Status: DC | PRN

## 2018-05-23 MED ORDER — FENTANYL CITRATE (PF) 100 MCG/2ML IJ SOLN
25.0000 ug | INTRAMUSCULAR | Status: DC | PRN
  Administered 2018-05-23 (×2): 50 ug via INTRAVENOUS

## 2018-05-23 MED ORDER — MIDAZOLAM HCL 2 MG/2ML IJ SOLN
INTRAMUSCULAR | Status: AC
  Filled 2018-05-23: qty 2

## 2018-05-23 MED ORDER — OXYCODONE HCL 5 MG PO TABS
10.0000 mg | ORAL_TABLET | ORAL | Status: DC | PRN
  Administered 2018-05-23 (×3): 10 mg via ORAL
  Filled 2018-05-23 (×2): qty 2

## 2018-05-23 MED ORDER — CEFAZOLIN SODIUM-DEXTROSE 2-4 GM/100ML-% IV SOLN
INTRAVENOUS | Status: AC
Start: 2018-05-23 — End: 2018-05-23
  Filled 2018-05-23: qty 100

## 2018-05-23 MED ORDER — ACETAMINOPHEN 325 MG PO TABS: 650.0000 mg | ORAL_TABLET | ORAL | Status: DC | PRN

## 2018-05-23 MED ORDER — ONDANSETRON HCL 4 MG PO TABS: 4.0000 mg | ORAL_TABLET | Freq: Four times a day (QID) | ORAL | Status: DC | PRN

## 2018-05-23 MED ORDER — DEXAMETHASONE 4 MG PO TABS
4.0000 mg | ORAL_TABLET | Freq: Four times a day (QID) | ORAL | Status: DC
  Administered 2018-05-23 – 2018-05-25 (×6): 4 mg via ORAL
  Filled 2018-05-23 (×7): qty 1

## 2018-05-23 MED ORDER — VANCOMYCIN HCL 1000 MG IV SOLR
INTRAVENOUS | Status: AC
  Filled 2018-05-23: qty 1000

## 2018-05-23 MED ORDER — PHENYLEPHRINE 40 MCG/ML (10ML) SYRINGE FOR IV PUSH (FOR BLOOD PRESSURE SUPPORT)
PREFILLED_SYRINGE | INTRAVENOUS | Status: AC
  Filled 2018-05-23: qty 10

## 2018-05-23 MED ORDER — BUPIVACAINE HCL (PF) 0.25 % IJ SOLN
INTRAMUSCULAR | Status: DC | PRN
  Administered 2018-05-23: 20 mL

## 2018-05-23 MED ORDER — CEFAZOLIN SODIUM-DEXTROSE 2-4 GM/100ML-% IV SOLN
2.0000 g | INTRAVENOUS | Status: AC
  Administered 2018-05-23: 2 g via INTRAVENOUS

## 2018-05-23 MED ORDER — FENTANYL CITRATE (PF) 250 MCG/5ML IJ SOLN
INTRAMUSCULAR | Status: DC | PRN
  Administered 2018-05-23 (×3): 50 ug via INTRAVENOUS
  Administered 2018-05-23: 100 ug via INTRAVENOUS
  Administered 2018-05-23 (×4): 50 ug via INTRAVENOUS

## 2018-05-23 MED ORDER — FENTANYL CITRATE (PF) 100 MCG/2ML IJ SOLN
INTRAMUSCULAR | Status: AC
  Filled 2018-05-23: qty 2

## 2018-05-23 MED ORDER — HYDROMORPHONE HCL 1 MG/ML IJ SOLN
1.0000 mg | INTRAMUSCULAR | Status: DC | PRN
  Administered 2018-05-23 – 2018-05-24 (×2): 1 mg via INTRAVENOUS
  Filled 2018-05-23 (×2): qty 1

## 2018-05-23 MED ORDER — FENTANYL CITRATE (PF) 250 MCG/5ML IJ SOLN
INTRAMUSCULAR | Status: AC
  Filled 2018-05-23: qty 5

## 2018-05-23 MED ORDER — BUPIVACAINE HCL (PF) 0.25 % IJ SOLN
INTRAMUSCULAR | Status: AC
  Filled 2018-05-23: qty 30

## 2018-05-23 MED ORDER — ROCURONIUM BROMIDE 50 MG/5ML IV SOSY
PREFILLED_SYRINGE | INTRAVENOUS | Status: AC
  Filled 2018-05-23: qty 5

## 2018-05-23 MED ORDER — SODIUM CHLORIDE 0.9 % IV SOLN: 250.0000 mL | INTRAVENOUS | Status: DC

## 2018-05-23 MED ORDER — LIDOCAINE 2% (20 MG/ML) 5 ML SYRINGE
INTRAMUSCULAR | Status: DC | PRN
  Administered 2018-05-23: 80 mg via INTRAVENOUS
  Administered 2018-05-23: 60 mg via INTRAVENOUS

## 2018-05-23 MED ORDER — MENTHOL 3 MG MT LOZG: 1.0000 | LOZENGE | OROMUCOSAL | Status: DC | PRN

## 2018-05-23 MED ORDER — DEXAMETHASONE SODIUM PHOSPHATE 4 MG/ML IJ SOLN
4.0000 mg | Freq: Four times a day (QID) | INTRAMUSCULAR | Status: DC
  Administered 2018-05-23 – 2018-05-24 (×2): 4 mg via INTRAVENOUS
  Filled 2018-05-23 (×2): qty 1

## 2018-05-23 MED ORDER — DIAZEPAM 5 MG PO TABS
5.0000 mg | ORAL_TABLET | Freq: Four times a day (QID) | ORAL | Status: DC | PRN
  Administered 2018-05-23: 10 mg via ORAL
  Filled 2018-05-23: qty 2

## 2018-05-23 MED ORDER — ONDANSETRON HCL 4 MG/2ML IJ SOLN
INTRAMUSCULAR | Status: DC | PRN
  Administered 2018-05-23: 4 mg via INTRAVENOUS

## 2018-05-23 MED ORDER — VANCOMYCIN HCL 1000 MG IV SOLR
INTRAVENOUS | Status: DC | PRN
  Administered 2018-05-23: 1000 mg via TOPICAL

## 2018-05-23 MED ORDER — ROCURONIUM BROMIDE 10 MG/ML (PF) SYRINGE
PREFILLED_SYRINGE | INTRAVENOUS | Status: DC | PRN
  Administered 2018-05-23 (×2): 20 mg via INTRAVENOUS
  Administered 2018-05-23: 30 mg via INTRAVENOUS
  Administered 2018-05-23: 50 mg via INTRAVENOUS

## 2018-05-23 MED ORDER — SODIUM CHLORIDE 0.9% FLUSH: 3.0000 mL | INTRAVENOUS | Status: DC | PRN

## 2018-05-23 MED ORDER — SODIUM CHLORIDE 0.9 % IV SOLN
INTRAVENOUS | Status: DC | PRN
  Administered 2018-05-23: 13:00:00

## 2018-05-23 MED ORDER — LACTATED RINGERS IV SOLN
INTRAVENOUS | Status: DC
  Administered 2018-05-23 (×3): via INTRAVENOUS

## 2018-05-23 MED ORDER — CHLORHEXIDINE GLUCONATE CLOTH 2 % EX PADS: 6.0000 | MEDICATED_PAD | Freq: Once | CUTANEOUS | Status: DC

## 2018-05-23 MED ORDER — OXYCODONE HCL 5 MG/5ML PO SOLN: 5.0000 mg | Freq: Once | ORAL | Status: AC | PRN

## 2018-05-23 MED ORDER — LIDOCAINE 2% (20 MG/ML) 5 ML SYRINGE
INTRAMUSCULAR | Status: AC
  Filled 2018-05-23: qty 5

## 2018-05-23 MED ORDER — CEFAZOLIN SODIUM-DEXTROSE 1-4 GM/50ML-% IV SOLN
1.0000 g | Freq: Three times a day (TID) | INTRAVENOUS | Status: AC
  Administered 2018-05-23 – 2018-05-24 (×2): 1 g via INTRAVENOUS
  Filled 2018-05-23 (×3): qty 50

## 2018-05-23 MED ORDER — ONDANSETRON HCL 4 MG/2ML IJ SOLN: 4.0000 mg | Freq: Four times a day (QID) | INTRAMUSCULAR | Status: DC | PRN

## 2018-05-23 MED ORDER — BACITRACIN ZINC 500 UNIT/GM EX OINT
TOPICAL_OINTMENT | CUTANEOUS | Status: DC | PRN
  Administered 2018-05-23: 1 via TOPICAL

## 2018-05-23 MED ORDER — OXYCODONE HCL 5 MG PO TABS
5.0000 mg | ORAL_TABLET | Freq: Once | ORAL | Status: AC | PRN
  Administered 2018-05-23: 5 mg via ORAL

## 2018-05-23 MED ORDER — DEXAMETHASONE SODIUM PHOSPHATE 10 MG/ML IJ SOLN
INTRAMUSCULAR | Status: AC
  Filled 2018-05-23: qty 1

## 2018-05-23 MED ORDER — SUGAMMADEX SODIUM 200 MG/2ML IV SOLN
INTRAVENOUS | Status: DC | PRN
  Administered 2018-05-23 (×2): 200 mg via INTRAVENOUS

## 2018-05-23 MED ORDER — MIDAZOLAM HCL 2 MG/2ML IJ SOLN
INTRAMUSCULAR | Status: DC | PRN
  Administered 2018-05-23: 2 mg via INTRAVENOUS

## 2018-05-23 MED ORDER — THROMBIN (RECOMBINANT) 20000 UNITS EX SOLR
CUTANEOUS | Status: AC
  Filled 2018-05-23: qty 20000

## 2018-05-23 MED ORDER — ACETAMINOPHEN 650 MG RE SUPP: 650.0000 mg | RECTAL | Status: DC | PRN

## 2018-05-23 MED ORDER — THROMBIN 20000 UNITS EX SOLR
CUTANEOUS | Status: DC | PRN
  Administered 2018-05-23: 13:00:00 via TOPICAL

## 2018-05-23 MED ORDER — BACITRACIN ZINC 500 UNIT/GM EX OINT
TOPICAL_OINTMENT | CUTANEOUS | Status: AC
  Filled 2018-05-23: qty 28.35

## 2018-05-23 MED ORDER — PROPOFOL 10 MG/ML IV BOLUS
INTRAVENOUS | Status: DC | PRN
  Administered 2018-05-23: 180 mg via INTRAVENOUS

## 2018-05-23 MED ORDER — HYDROCODONE-ACETAMINOPHEN 10-325 MG PO TABS
1.0000 | ORAL_TABLET | ORAL | Status: DC | PRN
  Administered 2018-05-24 – 2018-05-25 (×4): 1 via ORAL
  Filled 2018-05-23 (×5): qty 1

## 2018-05-23 MED ORDER — HEMOSTATIC AGENTS (NO CHARGE) OPTIME
TOPICAL | Status: DC | PRN
  Administered 2018-05-23 (×2): 1 via TOPICAL

## 2018-05-23 MED ORDER — SODIUM CHLORIDE 0.9% FLUSH
3.0000 mL | Freq: Two times a day (BID) | INTRAVENOUS | Status: DC
  Administered 2018-05-23 – 2018-05-24 (×2): 3 mL via INTRAVENOUS

## 2018-05-23 MED ORDER — PROPOFOL 10 MG/ML IV BOLUS
INTRAVENOUS | Status: AC
  Filled 2018-05-23: qty 20

## 2018-05-23 MED ORDER — OXYCODONE HCL 5 MG PO TABS
ORAL_TABLET | ORAL | Status: AC
  Filled 2018-05-23: qty 3

## 2018-05-23 MED ORDER — PHENYLEPHRINE 40 MCG/ML (10ML) SYRINGE FOR IV PUSH (FOR BLOOD PRESSURE SUPPORT)
PREFILLED_SYRINGE | INTRAVENOUS | Status: DC | PRN
  Administered 2018-05-23 (×2): 80 ug via INTRAVENOUS

## 2018-05-23 SURGICAL SUPPLY — 61 items
BAG DECANTER FOR FLEXI CONT (MISCELLANEOUS) ×3 IMPLANT
BENZOIN TINCTURE PRP APPL 2/3 (GAUZE/BANDAGES/DRESSINGS) ×3 IMPLANT
BIT DRILL 2.4 (BIT) ×2 IMPLANT
BIT DRILL 2.4MM (BIT) ×1
BLADE CLIPPER SURG (BLADE) ×3 IMPLANT
BUR MATCHSTICK NEURO 3.0 LAGG (BURR) ×3 IMPLANT
CANISTER SUCT 3000ML PPV (MISCELLANEOUS) ×3 IMPLANT
CARTRIDGE OIL MAESTRO DRILL (MISCELLANEOUS) ×1 IMPLANT
CLOSURE WOUND 1/2 X4 (GAUZE/BANDAGES/DRESSINGS) ×1
CONT SPEC 4OZ CLIKSEAL STRL BL (MISCELLANEOUS) ×3 IMPLANT
DERMABOND ADVANCED (GAUZE/BANDAGES/DRESSINGS) ×2
DERMABOND ADVANCED .7 DNX12 (GAUZE/BANDAGES/DRESSINGS) ×1 IMPLANT
DIFFUSER DRILL AIR PNEUMATIC (MISCELLANEOUS) ×3 IMPLANT
DRAPE C-ARM 42X72 X-RAY (DRAPES) ×6 IMPLANT
DRAPE LAPAROTOMY 100X72 PEDS (DRAPES) ×3 IMPLANT
DRSG OPSITE POSTOP 4X8 (GAUZE/BANDAGES/DRESSINGS) ×3 IMPLANT
DURAPREP 26ML APPLICATOR (WOUND CARE) ×3 IMPLANT
ELECT REM PT RETURN 9FT ADLT (ELECTROSURGICAL) ×3
ELECTRODE REM PT RTRN 9FT ADLT (ELECTROSURGICAL) ×1 IMPLANT
EVACUATOR 1/8 PVC DRAIN (DRAIN) ×3 IMPLANT
GAUZE 4X4 16PLY RFD (DISPOSABLE) IMPLANT
GAUZE SPONGE 4X4 12PLY STRL (GAUZE/BANDAGES/DRESSINGS) ×3 IMPLANT
GLOVE ECLIPSE 9.0 STRL (GLOVE) ×3 IMPLANT
GLOVE EXAM NITRILE LRG STRL (GLOVE) IMPLANT
GLOVE EXAM NITRILE XL STR (GLOVE) IMPLANT
GLOVE EXAM NITRILE XS STR PU (GLOVE) IMPLANT
GOWN STRL REUS W/ TWL LRG LVL3 (GOWN DISPOSABLE) ×1 IMPLANT
GOWN STRL REUS W/ TWL XL LVL3 (GOWN DISPOSABLE) ×2 IMPLANT
GOWN STRL REUS W/TWL 2XL LVL3 (GOWN DISPOSABLE) IMPLANT
GOWN STRL REUS W/TWL LRG LVL3 (GOWN DISPOSABLE) ×2
GOWN STRL REUS W/TWL XL LVL3 (GOWN DISPOSABLE) ×4
KIT BASIN OR (CUSTOM PROCEDURE TRAY) ×3 IMPLANT
KIT TURNOVER KIT B (KITS) ×3 IMPLANT
NEEDLE HYPO 22GX1.5 SAFETY (NEEDLE) ×3 IMPLANT
NEEDLE SPNL 22GX3.5 QUINCKE BK (NEEDLE) ×3 IMPLANT
NS IRRIG 1000ML POUR BTL (IV SOLUTION) ×3 IMPLANT
OIL CARTRIDGE MAESTRO DRILL (MISCELLANEOUS) ×3
PACK LAMINECTOMY NEURO (CUSTOM PROCEDURE TRAY) ×3 IMPLANT
PAD ARMBOARD 7.5X6 YLW CONV (MISCELLANEOUS) ×9 IMPLANT
PIN MAYFIELD SKULL DISP (PIN) ×3 IMPLANT
ROD PRE-CUT 3.5X90 (Rod) ×6 IMPLANT
SCREW MULT AX 28X3.5XMA NS LF (Screw) ×1 IMPLANT
SCREW MULTI AXIAL 3.5X16MM (Screw) ×24 IMPLANT
SCREW MULTI AXIAL 3.5X18MM (Screw) ×6 IMPLANT
SCREW MULTI AXIAL 3.5X26 (Screw) ×3 IMPLANT
SCREW MULTI AXIAL 3.5X28 (Screw) ×2 IMPLANT
SCREW SET 3600315 STANDARD (Screw) ×36 IMPLANT
SCREW SET 3600315 STD (Screw) ×12 IMPLANT
SPONGE LAP 4X18 RFD (DISPOSABLE) IMPLANT
SPONGE SURGIFOAM ABS GEL 100 (HEMOSTASIS) ×3 IMPLANT
STRIP CLOSURE SKIN 1/2X4 (GAUZE/BANDAGES/DRESSINGS) ×2 IMPLANT
SURGIFLO W/THROMBIN 8M KIT (HEMOSTASIS) ×3 IMPLANT
SUT VIC AB 0 CT1 18XCR BRD8 (SUTURE) ×2 IMPLANT
SUT VIC AB 0 CT1 8-18 (SUTURE) ×4
SUT VIC AB 2-0 CT1 18 (SUTURE) ×6 IMPLANT
SUT VIC AB 3-0 SH 8-18 (SUTURE) ×6 IMPLANT
TAPE CLOTH 4X10 WHT NS (GAUZE/BANDAGES/DRESSINGS) ×3 IMPLANT
TOWEL GREEN STERILE (TOWEL DISPOSABLE) ×3 IMPLANT
TOWEL GREEN STERILE FF (TOWEL DISPOSABLE) ×3 IMPLANT
TRAY FOLEY MTR SLVR 16FR STAT (SET/KITS/TRAYS/PACK) ×3 IMPLANT
WATER STERILE IRR 1000ML POUR (IV SOLUTION) ×3 IMPLANT

## 2018-05-23 NOTE — Brief Op Note (Signed)
05/23/2018  4:39 PM  PATIENT:  Ian Ramos  72 y.o. male  PRE-OPERATIVE DIAGNOSIS:  Myelopathy  POST-OPERATIVE DIAGNOSIS:  myelopathy  PROCEDURE:  Procedure(s): Posterior Cervical Fusion with lateral mass fixation - C1 - C6 with laminectomy (N/A)  SURGEON:  Surgeon(s) and Role:    * Julio Sicks, MD - Primary    * Donalee Citrin, MD - Assisting  PHYSICIAN ASSISTANT:   ASSISTANTS:    ANESTHESIA:   general  EBL:  300 mL   BLOOD ADMINISTERED:none  DRAINS: (med) Hemovact drain(s) in the epidural space with  Suction Open   LOCAL MEDICATIONS USED:  MARCAINE     SPECIMEN:  No Specimen  DISPOSITION OF SPECIMEN:  N/A  COUNTS:  YES  TOURNIQUET:  * No tourniquets in log *  DICTATION: .Dragon Dictation  PLAN OF CARE: Admit to inpatient   PATIENT DISPOSITION:  PACU - hemodynamically stable.   Delay start of Pharmacological VTE agent (>24hrs) due to surgical blood loss or risk of bleeding: yes

## 2018-05-23 NOTE — Transfer of Care (Signed)
Immediate Anesthesia Transfer of Care Note  Patient: Ian Ramos  Procedure(s) Performed: Posterior Cervical Fusion with lateral mass fixation - C1 - C6 with laminectomy (N/A )  Patient Location: PACU  Anesthesia Type:General  Level of Consciousness: awake, alert  and oriented  Airway & Oxygen Therapy: Patient Spontanous Breathing and Patient connected to face mask oxygen  Post-op Assessment: Report given to RN, Post -op Vital signs reviewed and stable and Patient moving all extremities X 4  Post vital signs: Reviewed and stable  Last Vitals:  Vitals Value Taken Time  BP 147/85 05/23/2018  4:54 PM  Temp    Pulse 77 05/23/2018  4:58 PM  Resp 22 05/23/2018  4:58 PM  SpO2 97 % 05/23/2018  4:58 PM  Vitals shown include unvalidated device data.  Last Pain:  Vitals:   05/23/18 1219  TempSrc:   PainSc: 0-No pain      Patients Stated Pain Goal: 3 (05/23/18 1219)  Complications: No apparent anesthesia complications

## 2018-05-23 NOTE — Op Note (Signed)
Date of procedure: 05/23/2018  Date of dictation: Same  Service: Neurosurgery  Preoperative diagnosis: Severe cervical stenosis with myelopathy  Postoperative diagnosis: Same  Procedure Name: C1-C2-C3 C4-C5-C6 decompressive laminectomy  C1-C6 posterior cervical fusion with segmental lateral mass instrumentation and local autograft.    Surgeon:Mailin Coglianese A.Carleta Woodrow, M.D.  Asst. Surgeon: Wynetta Emery  Anesthesia: General  Indication: 72 year old male with profound bilateral upper extremity numbness paresthesias weakness and spasticity left greater than right.  Work-up demonstrates evidence of critical cervical stenosis starting at the C1-2 level extending down to C6.  Patient presents now for posterior cervical decompression and fusion in hopes of improving his symptoms.  Operative note: After induction of anesthesia, patient carefully turned prone on the bolsters with his head fixed in a neutral head position in the [place] pin headrest.  Patient's posterior cervical region prepped and draped sterilely.  Incision made from his occipital region down to C7.  Subperiosteal dissection then performed bilaterally.  Retractor placed.  Decompressive laminectomies then performed using [facility] a high-speed drill to remove the entire lamina of C1-C2 C3-C4-C5 and C6.  Proximal foraminotomies performed on the left at C4-5.  At the C1-2 level the C2 nerve roots were dissected free.  Working in the axilla of the C2 nerve root I was able to enter the bony pannus behind the body of C2.  I then dissected this free using curettes and nerve hooks and removed a great deal of ventral compressive tissue using micropituitary's without having to retract or compress the thecal sac.  At this point I was happy with the ventral decompression was achieved.  Lateral masses of C1 were dissected free.  Pilot holes were made.  Drill was then passed under fluoroscopic guidance.  The drill hole was probed and found to be  solidly within the bone.  Medtronic 28 mm Infinity screws were placed bilaterally at C1.  Pedicle screws were placed at C2 bilaterally entering the vertebral body of C2.  Lateral mass fixation was then performed at C3-C4-C5 and C6 by first drilling a pilot hole just inferior and medial to the mid position of the articular mass.  The pilot hole was then extended in a cephalad lateral direction.  Each pilot hole was probed and found to be solidly within bone.  49mm Medtronic Infinity lateral mass screws were then placed at C3, C4, C5 and C6.  Fluoroscopic images reveal good position of the hardware at the proper operative levels.  Short segment titanium rod placed over the screw heads from C1-C6.  Locking caps placed of the screws were locking caps and engaged.  Lateral articular masses and facet joints were then decorticated using high-speed drill.  Morselized autograft was packed posterior laterally.  Gelfoam was placed over the laminectomy defect.  A medium Hemovac drain was left in the epidural space.  Wounds and closed in layers with Vicryl sutures.  Steri-Strips and sterile dressing were applied.  No apparent complications.  Patient tolerated the procedure well and he returns to the recovery room postop.

## 2018-05-23 NOTE — Progress Notes (Signed)
Reluctant to take pain meds despite looking very uncomfortable, yet does not outright refuse them. Currently look comfortable, has eyes closed, resting on bed. Is less diaphoretic than earlier .

## 2018-05-23 NOTE — H&P (Signed)
  Ian Ramos is an 72 y.o. male.   Chief Complaint: Weakness HPI: 72 year old male with progressive bilateral upper extremity weakness sensory loss and increasing lower extremity spasticity and weakness.  Work-up demonstrates evidence of critical multilevel cervical stenosis with spinal cord signal change.  Patient with evidence of an arthritic pannus at the C2 level with marked ventral compression of his upper cervical canal.  Patient also with a significant broad-based disc herniation at C4-5 with spinal cord signal abnormality at this level and foraminal stenosis left greater than right.  Patient with other severe areas of stenosis in the cervical spine.  Patient with significant bilateral upper extremity weakness with worsened left shoulder weakness.  Patient with difficulty with fine motor tasks and dexterity in both hands.  Patient with spastic gait.  Still continent of urine and stool.  Past Medical History:  Diagnosis Date  . Arthritis   . Neuromuscular disorder (HCC)     No past surgical history on file.  No family history on file. Social History:  reports that he has never smoked. His smokeless tobacco use includes chew. He reports that he does not drink alcohol or use drugs.  Allergies: No Known Allergies  No medications prior to admission.    No results found for this or any previous visit (from the past 48 hour(s)). No results found.  Pertinent items noted in HPI and remainder of comprehensive ROS otherwise negative.  There were no vitals taken for this visit.  Patient is awake and alert.  He is oriented and appropriate.  Cranial nerve function is intact.  Speech is fluent.  Judgment and insight are intact.  Examination neurologically finds his upper extremity strength to be abnormal with 2/5 left deltoid strength.  3/5 left bicep strength.  4/5 left brachioradialis and wrist extensor strength.  Patient with 4/5 triceps strength bilaterally.  4/5 grip strength  bilaterally.  Intrinsics 3/5 in his left hand 4/5 in his right hand.  Lower extremity strength 4/5 with spasticity.  Gait abnormal.  Examination of his sensory function reveals decreased sensation bilateral upper and lower extremities left worse than right.  Reflexes are hyperactive.  Hoffmann's responses are present in both hands.  Toes are upgoing to plantar stimulation.  Examination of the head ears eyes and throat is unremarkable.  Neck is stiff.  Airway is midline.  Carotid pulses are normal.  Examination of chest and abdomen are benign.  Extremities are free from injury deformity. Assessment/Plan Severe cervical stenosis with myelopathy.  Plan see [id number] decompressive laminectomy with posterior lateral fusion utilizing local autograft and segmental lateral mass screw fixation.  Risks and benefits of been explained.  Patient wishes to proceed.  Originally we would plan to follow this posterior operation with an anterior decompression as well.  However the insurance company has denied coverage of the anterior procedure so this will be not performed during this hospitalization and we will follow the patient over time to see whether this is necessary as well.  Ian Ramos A Ian Ramos 05/23/2018, 10:58 AM

## 2018-05-23 NOTE — Anesthesia Postprocedure Evaluation (Signed)
Anesthesia Post Note  Patient: Waymon T Scogin  Procedure(s) Performed: Posterior Cervical Fusion with lateral mass fixation - C1 - C6 with laminectomy (N/A )     Patient location during evaluation: PACU Anesthesia Type: General Level of consciousness: awake and alert Pain management: pain level controlled Vital Signs Assessment: post-procedure vital signs reviewed and stable Respiratory status: spontaneous breathing, nonlabored ventilation, respiratory function stable and patient connected to nasal cannula oxygen Cardiovascular status: blood pressure returned to baseline and stable Postop Assessment: no apparent nausea or vomiting Anesthetic complications: no    Last Vitals:  Vitals:   05/23/18 1740 05/23/18 1755  BP: (!) 163/71 (!) 173/78  Pulse: (!) 57 61  Resp: 13 (!) 25  Temp:    SpO2: 100% 100%    Last Pain:  Vitals:   05/23/18 1817  TempSrc:   PainSc: 10-Worst pain ever                 Emiliana Blaize COKER

## 2018-05-23 NOTE — Anesthesia Preprocedure Evaluation (Addendum)
Anesthesia Evaluation  Patient identified by MRN, date of birth, ID band Patient awake    Reviewed: Allergy & Precautions, NPO status , Patient's Chart, lab work & pertinent test results  History of Anesthesia Complications Negative for: history of anesthetic complications  Airway Mallampati: II  TM Distance: >3 FB Neck ROM: Limited    Dental  (+) Partial Upper, Partial Lower, Dental Advisory Given, Poor Dentition   Pulmonary neg pulmonary ROS,    breath sounds clear to auscultation       Cardiovascular (-) anginanegative cardio ROS   Rhythm:Regular Rate:Normal     Neuro/Psych  Brachial plexopathy - left arm weakness, right hand numbness   Neuromuscular disease negative psych ROS   GI/Hepatic negative GI ROS, Neg liver ROS,   Endo/Other  negative endocrine ROS  Renal/GU Renal InsufficiencyRenal disease  negative genitourinary   Musculoskeletal  (+) Arthritis ,  Gout    Abdominal   Peds  Hematology negative hematology ROS (+)   Anesthesia Other Findings   Reproductive/Obstetrics                            Anesthesia Physical Anesthesia Plan  ASA: II  Anesthesia Plan: General   Post-op Pain Management:    Induction: Intravenous  PONV Risk Score and Plan: 2 and Treatment may vary due to age or medical condition, Ondansetron and Dexamethasone  Airway Management Planned: Oral ETT and Video Laryngoscope Planned  Additional Equipment: None  Intra-op Plan:   Post-operative Plan: Extubation in OR  Informed Consent: I have reviewed the patients History and Physical, chart, labs and discussed the procedure including the risks, benefits and alternatives for the proposed anesthesia with the patient or authorized representative who has indicated his/her understanding and acceptance.   Dental advisory given  Plan Discussed with: CRNA and Anesthesiologist  Anesthesia Plan Comments:         Anesthesia Quick Evaluation

## 2018-05-23 NOTE — Anesthesia Procedure Notes (Signed)
Procedure Name: Intubation Date/Time: 05/23/2018 12:43 PM Performed by: Samara Deist, CRNA Pre-anesthesia Checklist: Patient identified, Emergency Drugs available, Suction available and Patient being monitored Patient Re-evaluated:Patient Re-evaluated prior to induction Oxygen Delivery Method: Circle System Utilized Preoxygenation: Pre-oxygenation with 100% oxygen Induction Type: IV induction Ventilation: Two handed mask ventilation required and Oral airway inserted - appropriate to patient size Laryngoscope Size: Glidescope and 4 Grade View: Grade I Tube type: Oral Tube size: 7.5 mm Number of attempts: 1 Airway Equipment and Method: Stylet,  Oral airway and Video-laryngoscopy Placement Confirmation: ETT inserted through vocal cords under direct vision,  positive ETCO2 and breath sounds checked- equal and bilateral Secured at: 23 cm Tube secured with: Tape Dental Injury: Teeth and Oropharynx as per pre-operative assessment  Difficulty Due To: Difficult Airway-  due to neck instability

## 2018-05-24 ENCOUNTER — Other Ambulatory Visit: Payer: Self-pay

## 2018-05-24 NOTE — Progress Notes (Signed)
Subjective: The patient is alert and pleasant.  His neck is appropriately sore.  He has not urinated yet.  He has ambulated.  His wife is at the bedside.  Objective: Vital signs in last 24 hours: Temp:  [97.6 F (36.4 C)-99.1 F (37.3 C)] 97.7 F (36.5 C) (08/24 0752) Pulse Rate:  [57-79] 65 (08/24 0752) Resp:  [11-25] 18 (08/24 0752) BP: (121-173)/(67-87) 124/76 (08/24 0752) SpO2:  [95 %-100 %] 98 % (08/24 0346) Weight:  [79.4 kg] 79.4 kg (08/23 1212) Estimated body mass index is 27.41 kg/m as calculated from the following:   Height as of this encounter: 5\' 7"  (1.702 m).   Weight as of this encounter: 79.4 kg.   Intake/Output from previous day: 08/23 0701 - 08/24 0700 In: 2675 [P.O.:200; I.V.:2400] Out: 1095 [Urine:650; Drains:145; Blood:300] Intake/Output this shift: No intake/output data recorded.  Physical exam the patient is alert and pleasant.  He is moving all 4 extremities.  Lab Results: No results for input(s): WBC, HGB, HCT, PLT in the last 72 hours. BMET No results for input(s): NA, K, CL, CO2, GLUCOSE, BUN, CREATININE, CALCIUM in the last 72 hours.  Studies/Results: Dg Cervical Spine 2-3 Views  Result Date: 05/23/2018 CLINICAL DATA:  C1-C6 decompression and fusion. EXAM: CERVICAL SPINE - 2-3 VIEW; DG C-ARM 61-120 MIN COMPARISON:  MRI 01/01/2018 FINDINGS: C-arm images show posterior fusion with lateral mass screws and posterior rods from C1 through C6. No radiographically detectable complication on this limited imaging. IMPRESSION: Posterior fusion from C1 through C6. Electronically Signed   By: Paulina Fusi M.D.   On: 05/23/2018 16:26   Dg C-arm 1-60 Min  Result Date: 05/23/2018 CLINICAL DATA:  C1-C6 decompression and fusion. EXAM: CERVICAL SPINE - 2-3 VIEW; DG C-ARM 61-120 MIN COMPARISON:  MRI 01/01/2018 FINDINGS: C-arm images show posterior fusion with lateral mass screws and posterior rods from C1 through C6. No radiographically detectable complication on this  limited imaging. IMPRESSION: Posterior fusion from C1 through C6. Electronically Signed   By: Paulina Fusi M.D.   On: 05/23/2018 16:26    Assessment/Plan: Postop day #1: We will discontinue his Hemovac.  He may go home tomorrow.  LOS: 1 day     Cristi Loron 05/24/2018, 8:56 AM

## 2018-05-24 NOTE — Progress Notes (Signed)
Foley catheter discontinued.

## 2018-05-24 NOTE — Evaluation (Signed)
Occupational Therapy Evaluation Patient Details Name: Ian Ramos MRN: [id number] DOB: [birth year] Today's Date: 05/24/2018    History of Present Illness C1-C2-C3 C4-C5-C6 decompressive laminectomy and posterior fusion. PHMx: OA and  gout   Clinical Impression   This 72 yo male admitted and underwent above presents to acute OT with decreased balance, decreased use of LUE, decreased sensation in Bil hands all affecting his safety and independence with basic ADLs. He will benefit from acute OT with follow up HHOT.    Follow Up Recommendations  Home health OT;Supervision/Assistance - 24 hour    Equipment Recommendations  None recommended by OT       Precautions / Restrictions Precautions Precautions: Fall;Cervical Required Braces or Orthoses: Cervical Brace Cervical Brace: At all times;Soft collar Restrictions Weight Bearing Restrictions: No      Mobility Bed Mobility Overal bed mobility: Needs Assistance Bed Mobility: Rolling;Sidelying to Sit Rolling: Min guard Sidelying to sit: Min guard       General bed mobility comments: VCs for technique  Transfers Overall transfer level: Needs assistance Equipment used: Rolling walker (2 wheeled) Transfers: Sit to/from Stand Sit to Stand: Min assist              Balance Overall balance assessment: Needs assistance Sitting-balance support: No upper extremity supported;Feet supported Sitting balance-Leahy Scale: Good     Standing balance support: Bilateral upper extremity supported;During functional activity Standing balance-Leahy Scale: Poor                             ADL either performed or assessed with clinical judgement   ADL Overall ADL's : Needs assistance/impaired Eating/Feeding: Set up;Sitting   Grooming: Set up;Supervision/safety;Sitting   Upper Body Bathing: Supervision/ safety;Set up;Sitting   Lower Body Bathing: Minimal assistance;Sit to/from stand   Upper Body Dressing : Minimal  assistance;Sitting   Lower Body Dressing: Minimal assistance;Sit to/from stand   Toilet Transfer: Minimal assistance;Ambulation;RW Toilet Transfer Details (indicate cue type and reason): bed>door>recliner Toileting- Clothing Manipulation and Hygiene: Minimal assistance;Sit to/from stand               Vision Patient Visual Report: No change from baseline              Pertinent Vitals/Pain Pain Assessment: 0-10 Pain Score: 5  Pain Location: neck Pain Descriptors / Indicators: Aching;Sore Pain Intervention(s): Limited activity within patient's tolerance;Monitored during session;Repositioned     Hand Dominance Right   Extremity/Trunk Assessment Upper Extremity Assessment Upper Extremity Assessment: RUE deficits/detail;LUE deficits/detail RUE Deficits / Details: tingling/numbness in digits 4 and 5: "I have to scribble my name", cannot button buttons RUE Coordination: decreased fine motor LUE Deficits / Details: tingling and numbness throught out hand, decreased AROM of shoulder (shoulder hikes and internally rotates when trying shoulder flexion) LUE Coordination: decreased fine motor;decreased gross motor           Communication Communication Communication: No difficulties   Cognition Arousal/Alertness: Awake/alert Behavior During Therapy: WFL for tasks assessed/performed Overall Cognitive Status: Within Functional Limits for tasks assessed                                        Exercises Other Exercises Other Exercises: Educated pt on use of RUE to A LUE with shoulder flexion not higher than 90 degrees (5 times a day 10 reps each)  Home Living Family/patient expects to be discharged to:: Private residence Living Arrangements: Alone Available Help at Discharge: Family;Available PRN/intermittently Type of Home: House Home Access: Stairs to enter [employer] of Steps: 2/1 no rail or 4 rails   Home Layout: One level      Bathroom Shower/Tub: IT trainer: Standard Bathroom Accessibility: No              Prior Functioning/Environment Level of Independence: Independent        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        OT Problem List: Decreased strength;Decreased range of motion;Impaired balance (sitting and/or standing);Pain;Impaired UE functional use      OT Treatment/Interventions: Self-care/ADL training;Balance training;Patient/family education;Therapeutic activities;Therapeutic exercise    OT Goals(Current goals can be found in the care plan section) Acute Rehab OT Goals Patient Stated Goal: to go home OT Goal Formulation: With patient Time For Goal Achievement: 06/07/18 Potential to Achieve Goals: Good  OT Frequency: Min 2X/week   Barriers to D/C: Decreased caregiver support             AM-PAC PT "6 Clicks" Daily Activity     Outcome Measure Help from another person eating meals?: A Little Help from another person taking care of personal grooming?: A Little Help from another person toileting, which includes using toliet, bedpan, or urinal?: A Little Help from another person bathing (including washing, rinsing, drying)?: A Little Help from another person to put on and taking off regular upper body clothing?: A Little Help from another person to put on and taking off regular lower body clothing?: A Little 6 Click Score: 18   End of Session Equipment Utilized During Treatment: Gait belt;Rolling walker;Cervical collar Nurse Communication: (cleared with RN to not have alarm in chair with pt)  Activity Tolerance: Patient tolerated treatment well Patient left: in chair;with call bell/phone within reach  OT Visit Diagnosis: Unsteadiness on feet (R26.81);Other abnormalities of gait and mobility (R26.89);Pain Pain - part of body: (neck)                Time: 8527-7824 OT Time Calculation (min): 41 min Charges:  OT General Charges $OT Visit: 1 Visit OT  Evaluation $OT Eval Moderate Complexity: 1 Mod OT Treatments $Self Care/Home Management : 8-22 mins $Therapeutic Exercise: 8-22 mins  Ignacia Palma, OTR/L [phone] 05/24/2018

## 2018-05-24 NOTE — Evaluation (Signed)
Physical Therapy Evaluation Patient Details Name: Ian Ramos MRN: [id number] DOB: [birth year] Today's Date: 05/24/2018   History of Present Illness  C1-C2-C3 C4-C5-C6 decompressive laminectomy and posterior fusion. PHMx: OA and  gout  Clinical Impression  Patient is s/p above surgery resulting in functional limitations due to the deficits listed below (see PT Problem List). PTA, pt independent at home with stairs to enter. Upon eval pt not wearing brace reports he doesn't like it. reviewed precautions. Ambulating unit min guard, stairs min guard could benefit from more focus on them for safe home entry. Safer with RW at this time, unsteady without UE support currently.  Patient will benefit from skilled PT to increase their independence and safety with mobility to allow discharge to the venue listed below.       Follow Up Recommendations Home health PT    Equipment Recommendations  Rolling walker with 5" wheels    Recommendations for Other Services       Precautions / Restrictions Precautions Precautions: Fall;Cervical Precaution Comments: reviewed precautions  Required Braces or Orthoses: Cervical Brace Cervical Brace: At all times;Soft collar Restrictions Weight Bearing Restrictions: No      Mobility  Bed Mobility Overal bed mobility: Needs Assistance Bed Mobility: Rolling;Sidelying to Sit Rolling: Min guard Sidelying to sit: Min guard          Transfers Overall transfer level: Needs assistance Equipment used: Rolling walker (2 wheeled) Transfers: Sit to/from Stand Sit to Stand: Min guard            Ambulation/Gait Ambulation/Gait assistance: Min Emergency planning/management officer (Feet): 250 Feet Assistive device: None;Rolling walker (2 wheeled) Gait Pattern/deviations: Step-to pattern;Step-through pattern Gait velocity: decreased   General Gait Details: pt ambulating hallway with and without AD, much more safe and stable with RW at this time. pt with  unsteadiness without.   Stairs Stairs: Yes Stairs assistance: Min guard Stair Management: One rail Left;One rail Right Number of Stairs: 6 General stair comments: cued patient to use stronger leg as his R knee is painful, step to pattern. min guard for safety could benefit from further training with stairs  Wheelchair Mobility    Modified Rankin (Stroke Patients Only)       Balance Overall balance assessment: Needs assistance Sitting-balance support: No upper extremity supported;Feet supported Sitting balance-Leahy Scale: Good     Standing balance support: Bilateral upper extremity supported;During functional activity Standing balance-Leahy Scale: Poor                               Pertinent Vitals/Pain Pain Assessment: Faces Faces Pain Scale: Hurts little more Pain Location: neck Pain Descriptors / Indicators: Aching;Sore Pain Intervention(s): Limited activity within patient's tolerance;Monitored during session;Premedicated before session    Home Living Family/patient expects to be discharged to:: Private residence Living Arrangements: Alone Available Help at Discharge: Family;Available PRN/intermittently Type of Home: House Home Access: Stairs to enter Entrance Stairs-Rails: Right;Can reach both Entrance Stairs-Number of Steps: 2/1 no rail or 4 rails Home Layout: One level Home Equipment: Walker - 2 wheels      Prior Function Level of Independence: Independent         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     Hand Dominance   Dominant Hand: Right    Extremity/Trunk Assessment   Upper Extremity Assessment Upper Extremity Assessment: Defer to OT evaluation    Lower Extremity Assessment Lower Extremity Assessment: Overall WFL for tasks assessed  Communication   Communication: No difficulties  Cognition Arousal/Alertness: Awake/alert Behavior During Therapy: WFL for tasks assessed/performed Overall Cognitive Status: Within Functional  Limits for tasks assessed                                        General Comments      Exercises     Assessment/Plan    PT Assessment Patient needs continued PT services  PT Problem List Decreased strength;Decreased activity tolerance;Decreased range of motion;Decreased mobility;Decreased balance;Decreased knowledge of precautions       PT Treatment Interventions DME instruction;Gait training;Stair training;Functional mobility training;Therapeutic activities;Therapeutic exercise;Balance training    PT Goals (Current goals can be found in the Care Plan section)  Acute Rehab PT Goals Patient Stated Goal: to go home PT Goal Formulation: With patient Time For Goal Achievement: 05/31/18 Potential to Achieve Goals: Good    Frequency Min 4X/week   Barriers to discharge        Co-evaluation               AM-PAC PT "6 Clicks" Daily Activity  Outcome Measure Difficulty turning over in bed (including adjusting bedclothes, sheets and blankets)?: A Little Difficulty moving from lying on back to sitting on the side of the bed? : A Little Difficulty sitting down on and standing up from a chair with arms (e.g., wheelchair, bedside commode, etc,.)?: A Little Help needed moving to and from a bed to chair (including a wheelchair)?: A Little Help needed walking in hospital room?: A Little Help needed climbing 3-5 steps with a railing? : A Little 6 Click Score: 18    End of Session Equipment Utilized During Treatment: Gait belt Activity Tolerance: Patient tolerated treatment well Patient left: in bed;with call bell/phone within reach Nurse Communication: Mobility status PT Visit Diagnosis: Unsteadiness on feet (R26.81);Difficulty in walking, not elsewhere classified (R26.2);Other abnormalities of gait and mobility (R26.89)    Time: 9480-1655 PT Time Calculation (min) (ACUTE ONLY): 25 min   Charges:   PT Evaluation $PT Eval Low Complexity: 1 Low PT  Treatments $Gait Training: 8-22 mins        Etta Grandchild, PT, DPT Acute Rehab Services Pager: [phone]   Etta Grandchild 05/24/2018, 5:05 PM

## 2018-05-25 NOTE — Progress Notes (Signed)
Patient ID: Ian Ramos, male   DOB: 24-Feb-1946, 72 y.o.   MRN: [id number] Planes appropriate neck soreness. He has met a lap around the hall. He is 2 out of 5 strength in the left shoulder and 4 minus in the right shoulder but otherwise seems to have fairly good strength except for weakness in the hand grips. Dressing is somewhat saturated. Collar in place. Working with therapy. Like to go home but we'll see how he is doing with therapy today

## 2018-05-25 NOTE — Progress Notes (Signed)
Occupational Therapy Treatment Patient Details Name: Ian Ramos MRN: [id number] DOB: June 23, 1946 Today's Date: 05/25/2018    History of present illness C1-C2-C3 C4-C5-C6 decompressive laminectomy and posterior fusion. PHMx: OA and  gout   OT comments  Patient progressing as expected.  Educated on use of button hook/zipper pull for dressing tasks, initially requires hand over hand support to complete fading to supervision but appears hesitant to use.  Educated on and provided HEP/putty for hand strength/coordination.  Demonstrates transfers and mobility using RW with supervision, continued education on precautions and safety.  Patient will have support of spouse at discharge, educated on supervision for mobility and ADLs; pt and spouse agreeable.  Will continue to follow while admitted, dc plan remains appropriate.      Follow Up Recommendations  Home health OT;Supervision/Assistance - 24 hour    Equipment Recommendations  None recommended by OT    Recommendations for Other Services      Precautions / Restrictions Precautions Precautions: Fall;Cervical Precaution Comments: reviewed precautions  Required Braces or Orthoses: Cervical Brace Cervical Brace: At all times;Soft collar Restrictions Weight Bearing Restrictions: No       Mobility Bed Mobility               General bed mobility comments: seated OOB in recliner   Transfers Overall transfer level: Needs assistance Equipment used: Rolling walker (2 wheeled) Transfers: Sit to/from Stand Sit to Stand: Supervision         General transfer comment: supervision for safety    Balance Overall balance assessment: Needs assistance Sitting-balance support: No upper extremity supported;Feet supported Sitting balance-Leahy Scale: Good     Standing balance support: During functional activity;No upper extremity supported Standing balance-Leahy Scale: Fair                             ADL either  performed or assessed with clinical judgement   ADL Overall ADL's : Needs assistance/impaired     Grooming: Supervision/safety;Standing;Wash/dry hands           Upper Body Dressing : Minimal assistance;Sitting;Cueing for compensatory techniques;With adaptive equipment Upper Body Dressing Details (indicate cue type and reason): educated patient and spouse on button hook and technique for UB dressing with cervical precautions and brace      Toilet Transfer: Supervision/safety;Ambulation;RW(simulated in room) Toilet Transfer Details (indicate cue type and reason): cueing for safety and use of RW          Functional mobility during ADLs: Supervision/safety;Rolling walker General ADL Comments: patient with limited acceptance to modified techniques and use of button hook for dressing, able to complete after education      Vision       Perception     Praxis      Cognition Arousal/Alertness: Awake/alert Behavior During Therapy: Flat affect Overall Cognitive Status: Within Functional Limits for tasks assessed Area of Impairment: Problem solving                             Problem Solving: Slow processing;Requires verbal cues          Exercises Other Exercises Other Exercises: educated and provided HEP/ tan theraputty for exericses: completed gross grasp, rolling, pinch, pinch and pull, flattening    Shoulder Instructions       General Comments spouse present and supportive     Pertinent Vitals/ Pain       Pain Assessment: Faces Faces  Pain Scale: Hurts a little bit Pain Location: neck Pain Descriptors / Indicators: Aching;Sore Pain Intervention(s): Monitored during session  Home Living                                          Prior Functioning/Environment              Frequency  Min 2X/week        Progress Toward Goals  OT Goals(current goals can now be found in the care plan section)  Progress towards OT goals:  Progressing toward goals  Acute Rehab OT Goals Patient Stated Goal: to go home OT Goal Formulation: With patient Time For Goal Achievement: 06/07/18 Potential to Achieve Goals: Good  Plan Discharge plan remains appropriate;Frequency remains appropriate    Co-evaluation                 AM-PAC PT "6 Clicks" Daily Activity     Outcome Measure   Help from another person eating meals?: None Help from another person taking care of personal grooming?: A Little Help from another person toileting, which includes using toliet, bedpan, or urinal?: A Little Help from another person bathing (including washing, rinsing, drying)?: A Little Help from another person to put on and taking off regular upper body clothing?: A Little Help from another person to put on and taking off regular lower body clothing?: A Little 6 Click Score: 19    End of Session Equipment Utilized During Treatment: Rolling walker;Cervical collar  OT Visit Diagnosis: Unsteadiness on feet (R26.81);Other abnormalities of gait and mobility (R26.89);Pain Pain - part of body: (neck)   Activity Tolerance Patient tolerated treatment well   Patient Left in chair;with call bell/phone within reach;with family/visitor present   Nurse Communication Mobility status        Time: 1610-9604 OT Time Calculation (min): 25 min  Charges: OT General Charges $OT Visit: 1 Visit OT Treatments $Self Care/Home Management : 8-22 mins $Therapeutic Exercise: 8-22 mins  Chancy Milroy, OTR/L  Pager [phone]    Chancy Milroy 05/25/2018, 10:46 AM

## 2018-05-25 NOTE — Care Management (Signed)
Spoke with pt and family regarding DME needs.  Pt declines 3n1 but will need RW. Reggie with [facility] to deliver to room prior to d/c.

## 2018-05-25 NOTE — Progress Notes (Signed)
Physical Therapy Treatment Patient Details Name: Ian Ramos MRN: [id number] DOB: 10/05/1945 Today's Date: 05/25/2018    History of Present Illness C1-C2-C3 C4-C5-C6 decompressive laminectomy and posterior fusion. PHMx: OA and  gout    PT Comments    Patient seen for activity progression, mobilizing fairly well overall, no physical assist required this session. Patient ambulated in hall and performed stair negotiation. Educated patient on car transfer and mobility expectations for home. Patient and wife receptive. Anticipate patient will be safe for d/c home with wife.   Follow Up Recommendations  Supervision for mobility/OOB     Equipment Recommendations  Rolling walker with 5" wheels    Recommendations for Other Services       Precautions / Restrictions Precautions Precautions: Fall;Cervical Precaution Comments: reviewed precautions  Required Braces or Orthoses: Cervical Brace Cervical Brace: At all times;Soft collar Restrictions Weight Bearing Restrictions: No    Mobility  Bed Mobility                  Transfers Overall transfer level: Needs assistance Equipment used: Rolling walker (2 wheeled) Transfers: Sit to/from Stand Sit to Stand: Supervision         General transfer comment: no physical assist required  Ambulation/Gait Ambulation/Gait assistance: Supervision Gait Distance (Feet): 310 Feet Assistive device: None;Rolling walker (2 wheeled) Gait Pattern/deviations: Step-to pattern;Step-through pattern Gait velocity: decreased   General Gait Details: ambulated in hall with RW, in room without devices.    Stairs Stairs: Yes Stairs assistance: Supervision Stair Management: One rail Left;One rail Right Number of Stairs: 4 General stair comments: Vcs for negotiation and technique   Wheelchair Mobility    Modified Rankin (Stroke Patients Only)       Balance Overall balance assessment: Needs assistance Sitting-balance support: No  upper extremity supported;Feet supported Sitting balance-Leahy Scale: Good     Standing balance support: During functional activity Standing balance-Leahy Scale: Fair                              Cognition Arousal/Alertness: Awake/alert Behavior During Therapy: Flat affect Overall Cognitive Status: Within Functional Limits for tasks assessed Area of Impairment: Problem solving                             Problem Solving: Slow processing;Requires verbal cues        Exercises Other Exercises Other Exercises: educated on car transfer, precautions an dmobility expectations    General Comments        Pertinent Vitals/Pain Pain Assessment: Faces Faces Pain Scale: Hurts a little bit Pain Location: neck Pain Descriptors / Indicators: Aching;Sore Pain Intervention(s): Monitored during session    Home Living                      Prior Function            PT Goals (current goals can now be found in the care plan section) Acute Rehab PT Goals Patient Stated Goal: to go home PT Goal Formulation: With patient Time For Goal Achievement: 05/31/18 Potential to Achieve Goals: Good Progress towards PT goals: Progressing toward goals    Frequency    Min 4X/week      PT Plan Current plan remains appropriate    Co-evaluation              AM-PAC PT "6 Clicks" Daily Activity  Outcome Measure  Difficulty turning over in bed (including adjusting bedclothes, sheets and blankets)?: A Little Difficulty moving from lying on back to sitting on the side of the bed? : A Little Difficulty sitting down on and standing up from a chair with arms (e.g., wheelchair, bedside commode, etc,.)?: A Little Help needed moving to and from a bed to chair (including a wheelchair)?: A Little Help needed walking in hospital room?: A Little Help needed climbing 3-5 steps with a railing? : A Little 6 Click Score: 18    End of Session Equipment Utilized During  Treatment: Gait belt Activity Tolerance: Patient tolerated treatment well Patient left: in chair;with call bell/phone within reach;with family/visitor present Nurse Communication: Mobility status PT Visit Diagnosis: Unsteadiness on feet (R26.81);Difficulty in walking, not elsewhere classified (R26.2);Other abnormalities of gait and mobility (R26.89)     Time: 6962-9528 PT Time Calculation (min) (ACUTE ONLY): 16 min  Charges:  $Gait Training: 8-22 mins                     Charlotte Crumb, PT DPT  Board Certified Neurologic Specialist [phone]    [name] 05/25/2018, 10:13 AM

## 2018-05-25 NOTE — Discharge Summary (Signed)
Physician Discharge Summary  Patient ID: Ian Ramos MRN: [id number] DOB/AGE: January 16, 1946 72 y.o.  Admit date: 05/23/2018 Discharge date: 05/25/2018  Admission Diagnoses: Severe cervical stenosis with myelopathy     Discharge Diagnoses: same   Discharged Condition:good  Hospital Course: The patient was admitted on 05/23/2018 and taken to the operating room where the patient underwent C1-C6 decompressive laminectomy. The patient tolerated the procedure well and was taken to the recovery room and then to the floor in stable condition. The hospital course was routine. There were no complications. The wound remained clean dry and intact. Pt had appropriate neck soreness. No complaints of arm pain or new N/T/W. The patient remained afebrile with stable vital signs, and tolerated a regular diet. The patient continued to increase activities, and pain was well controlled with oral pain medications.   Consults: none  Significant Diagnostic Studies:  Results for orders placed or performed during the hospital encounter of 05/16/18  Surgical pcr screen  Result Value Ref Range   MRSA, PCR NEGATIVE NEGATIVE   Staphylococcus aureus NEGATIVE NEGATIVE  Basic metabolic panel  Result Value Ref Range   Sodium 139 135 - 145 mmol/L   Potassium 3.8 3.5 - 5.1 mmol/L   Chloride 108 98 - 111 mmol/L   CO2 22 22 - 32 mmol/L   Glucose, Bld 110 (H) 70 - 99 mg/dL   BUN 22 8 - 23 mg/dL   Creatinine, Ser 6.96 (H) 0.61 - 1.24 mg/dL   Calcium 9.1 8.9 - 29.5 mg/dL   GFR calc non Af Amer 40 (L) >60 mL/min   GFR calc Af Amer 46 (L) >60 mL/min   Anion gap 9 5 - 15  CBC WITH DIFFERENTIAL  Result Value Ref Range   WBC 6.4 4.0 - 10.5 K/uL   RBC 4.57 4.22 - 5.81 MIL/uL   Hemoglobin 13.6 13.0 - 17.0 g/dL   HCT 28.4 13.2 - 44.0 %   MCV 89.5 78.0 - 100.0 fL   MCH 29.8 26.0 - 34.0 pg   MCHC 33.3 30.0 - 36.0 g/dL   RDW 10.2 72.5 - 36.6 %   Platelets 164 150 - 400 K/uL   Neutrophils Relative % 65 %   Neutro  Abs 4.1 1.7 - 7.7 K/uL   Lymphocytes Relative 23 %   Lymphs Abs 1.5 0.7 - 4.0 K/uL   Monocytes Relative 8 %   Monocytes Absolute 0.5 0.1 - 1.0 K/uL   Eosinophils Relative 3 %   Eosinophils Absolute 0.2 0.0 - 0.7 K/uL   Basophils Relative 1 %   Basophils Absolute 0.0 0.0 - 0.1 K/uL   Immature Granulocytes 0 %   Abs Immature Granulocytes 0.0 0.0 - 0.1 K/uL  Type and screen [facility]  Result Value Ref Range   ABO/RH(D) A POS    Antibody Screen NEG    Sample Expiration 05/30/2018    Extend sample reason      NO TRANSFUSIONS OR PREGNANCY IN THE PAST 3 MONTHS Performed at [facility], [address], [place], [place] [address]   ABO/Rh  Result Value Ref Range   ABO/RH(D)      A POS Performed at [facility], [address] [place], [place] [address]     Dg Cervical Spine 2-3 Views  Result Date: 05/23/2018 CLINICAL DATA:  C1-C6 decompression and fusion. EXAM: CERVICAL SPINE - 2-3 VIEW; DG C-ARM 61-120 MIN COMPARISON:  MRI 01/01/2018 FINDINGS: C-arm images show posterior fusion with lateral mass  screws and posterior rods from C1 through C6. No radiographically detectable complication on this limited imaging. IMPRESSION: Posterior fusion from C1 through C6. Electronically Signed   By: Paulina Fusi M.D.   On: 05/23/2018 16:26   Dg C-arm 1-60 Min  Result Date: 05/23/2018 CLINICAL DATA:  C1-C6 decompression and fusion. EXAM: CERVICAL SPINE - 2-3 VIEW; DG C-ARM 61-120 MIN COMPARISON:  MRI 01/01/2018 FINDINGS: C-arm images show posterior fusion with lateral mass screws and posterior rods from C1 through C6. No radiographically detectable complication on this limited imaging. IMPRESSION: Posterior fusion from C1 through C6. Electronically Signed   By: Paulina Fusi M.D.   On: 05/23/2018 16:26    Antibiotics:  Anti-infectives (From admission, onward)   Start     Dose/Rate Route Frequency Ordered Stop   05/24/18 0600  ceFAZolin (ANCEF) IVPB 2g/100 mL premix      2 g 200 mL/hr over 30 Minutes Intravenous On call to O.R. 05/23/18 1222 05/23/18 1248   05/23/18 1900  ceFAZolin (ANCEF) IVPB 1 g/50 mL premix     1 g 100 mL/hr over 30 Minutes Intravenous Every 8 hours 05/23/18 1817 05/24/18 0349   05/23/18 1545  vancomycin (VANCOCIN) powder  Status:  Discontinued       As needed 05/23/18 1545 05/23/18 1736   05/23/18 1312  bacitracin 50,000 Units in sodium chloride 0.9 % 500 mL irrigation  Status:  Discontinued       As needed 05/23/18 1312 05/23/18 1736   05/23/18 1225  ceFAZolin (ANCEF) 2-4 GM/100ML-% IVPB    Note to Pharmacy:  [facility] [employer]   : cabinet override      05/23/18 1225 05/23/18 1248      Discharge Exam: Blood pressure (!) 148/67, pulse 75, temperature 98.1 F (36.7 C), temperature source Oral, resp. rate 19, height 5\' 7"  (1.702 m), weight 79.4 kg, SpO2 95 %. Neuro intact with some left deltoid weakness Ambulating and voiding well  Discharge Medications:   Allergies as of 05/25/2018   No Known Allergies     Medication List    STOP taking these medications   ibuprofen 200 MG tablet Commonly known as:  ADVIL,MOTRIN     TAKE these medications   diazepam 5 MG tablet Commonly known as:  VALIUM Take 2 pills 30 minutes prior to procedure. May repeat one time if needed. What changed:    how much to take  how to take this  when to take this  additional instructions            Durable Medical Equipment  (From admission, onward)         Start     Ordered   05/25/18 1356  For home use only DME Dan Humphreys  [facility])  Once    Question:  Patient needs a walker to treat with the following condition  Answer:  H/O cervical spine surgery   05/25/18 1355   05/23/18 1817  DME Walker rolling  Once    Question:  Patient needs a walker to treat with the following condition  Answer:  Cervical myelopathy (HCC)   05/23/18 1817   05/23/18 1817  DME 3 n 1  Once     05/23/18 1817          Disposition: home   Final Dx: C1-C6  decompressive laminectomy  Discharge Instructions    Call MD for:  difficulty breathing, headache or visual disturbances   Complete by:  As directed    Call MD for:  hives  Complete by:  As directed    Call MD for:  persistant dizziness or light-headedness   Complete by:  As directed    Call MD for:  persistant nausea and vomiting   Complete by:  As directed    Call MD for:  redness, tenderness, or signs of infection (pain, swelling, redness, odor or green/yellow discharge around incision site)   Complete by:  As directed    Call MD for:  severe uncontrolled pain   Complete by:  As directed    Call MD for:  temperature >100.4   Complete by:  As directed    Diet - low sodium heart healthy   Complete by:  As directed    Driving Restrictions   Complete by:  As directed    No driving 2 weeks   Increase activity slowly   Complete by:  As directed    Lifting restrictions   Complete by:  As directed    Nothing heavier than 8 lbs   Remove dressing in 48 hours   Complete by:  As directed       Follow-up Information    Julio Sicks, MD. Schedule an appointment as soon as possible for a visit in 2 week(s).   Specialty:  Neurosurgery Contact information: [address] [place] [address] [phone]            Signed: Tiana Loft [facility] 05/25/2018, 1:56 PM

## 2018-05-25 NOTE — Discharge Instructions (Signed)
Laminectomy, Care After °This sheet gives you information about how to care for yourself after your procedure. Your health care provider may also give you more specific instructions. If you have problems or questions, contact your health care provider. °What can I expect after the procedure? °After the procedure, it is common to have: °· Some pain around your incision area. °· Muscle tightening (spasms) across the back. ° °Follow these instructions at home: °Incision care °· Follow instructions from your health care provider about how to take care of your incision area. Make sure you: °? Wash your hands with soap and water before and after you apply medicine to the area or change your bandage (dressing). If soap and water are not available, use hand sanitizer. °? Change your dressing as told by your health care provider. °? Leave stitches (sutures), skin glue, or adhesive strips in place. These skin closures may need to stay in place for 2 weeks or longer. If adhesive strip edges start to loosen and curl up, you may trim the loose edges. Do not remove adhesive strips completely unless your health care provider tells you to do that. °· Check your incision area every day for signs of infection. Check for: °? More redness, swelling, or pain. °? More fluid or blood. °? Warmth. °? Pus or a bad smell. °Medicines °· Take over-the-counter and prescription medicines only as told by your health care provider. °· If you were prescribed an antibiotic medicine, use it as told by your health care provider. Do not stop using the antibiotic even if you start to feel better. °Bathing °· Do not take baths, swim, or use a hot tub for 2 weeks, or until your incision has healed completely. °· If your health care provider approves, you may take showers after your dressing has been removed. °Activity °· Return to your normal activities as told by your health care provider. Ask your health care provider what activities are safe for  you. °· Avoid bending or twisting at your waist. Always bend at your knees. °· Do not sit for more than 20-30 minutes at a time. Lie down or walk between periods of sitting. °· Do not lift anything that is heavier than 10 lb (4.5 kg) or the limit that your health care provider tells you, until he or she says that it is safe. °· Do not drive for 2 weeks after your procedure or for as long as your health care provider tells you. °· Do not drive or use heavy machinery while taking prescription pain medicine. °General instructions °· To prevent or treat constipation while you are taking prescription pain medicine, your health care provider may recommend that you: °? Drink enough fluid to keep your urine clear or pale yellow. °? Take over-the-counter or prescription medicines. °? Eat foods that are high in fiber, such as fresh fruits and vegetables, whole grains, and beans. °? Limit foods that are high in fat and processed sugars, such as fried and sweet foods. °· Do breathing exercises as told. °· Keep all follow-up visits as told by your health care provider. This is important. °Contact a health care provider if: °· You have more redness, swelling, or pain around your incision area. °· Your incision feels warm to the touch. °· You are not able to return to activities or do exercises as told by your health care provider. °Get help right away if: °· You have: °? More fluid or blood coming from your incision area. °? Pus or   a bad smell coming from your incision area. °? Chills or a fever. °? Episodes of dizziness or fainting while standing. °· You develop a rash. °· You develop shortness of breath or you have difficulty breathing. °· You cannot control when you urinate or have a bowel movement. °· You become weak. °· You are not able to use your legs. °Summary °· After the procedure, it is common to have some pain around your incision area. You may also have muscle tightening (spasms) across the back. °· Follow  instructions from your health care provider about how to care for your incision. °· Do not lift anything that is heavier than 10 lb (4.5 kg) or the limit that your health care provider tells you, until he or she says that it is safe. °· Contact your health care provider if you have more redness, swelling, or pain around your incision area or if your incision feels warm to the touch. These can be signs of infection. °This information is not intended to replace advice given to you by your health care provider. Make sure you discuss any questions you have with your health care provider. °Document Released: 04/06/2005 Document Revised: 05/03/2016 Document Reviewed: 03/04/2016 °Elsevier Interactive Patient Education © 2018 [employer] ° °

## 2018-05-25 NOTE — Progress Notes (Signed)
Pt obtained a small skin tear to the right hand while ambulating to the bathroom. Dressing applied. Jorge Ny

## 2018-05-25 NOTE — Progress Notes (Signed)
Nsg Discharge Note  Admit Date:  05/23/2018 Discharge date: 05/25/2018   Jackquline Denmark Pellecchia to be D/C'd Home per MD order.  AVS completed.  Copy for chart, and copy for patient signed, and dated. Patient/caregiver able to verbalize understanding.  Discharge Medication: Allergies as of 05/25/2018   No Known Allergies     Medication List    STOP taking these medications   ibuprofen 200 MG tablet Commonly known as:  ADVIL,MOTRIN     TAKE these medications   diazepam 5 MG tablet Commonly known as:  VALIUM Take 2 pills 30 minutes prior to procedure. May repeat one time if needed. What changed:    how much to take  how to take this  when to take this  additional instructions            Durable Medical Equipment  (From admission, onward)         Start     Ordered   05/25/18 1356  For home use only DME Dan Humphreys  [facility])  Once    Question:  Patient needs a walker to treat with the following condition  Answer:  H/O cervical spine surgery   05/25/18 1355   05/23/18 1817  DME Walker rolling  Once    Question:  Patient needs a walker to treat with the following condition  Answer:  Cervical myelopathy (HCC)   05/23/18 1817   05/23/18 1817  DME 3 n 1  Once     05/23/18 1817          Discharge Assessment: Vitals:   05/25/18 0742 05/25/18 1400  BP: (!) 148/67 130/64  Pulse: 75 63  Resp: 19 18  Temp: 98.1 F (36.7 C) 98.1 F (36.7 C)  SpO2: 95% 99%   Skin clean, dry and intact without evidence of skin break down, no evidence of skin tears noted. IV catheter discontinued intact. Site without signs and symptoms of complications - no redness or edema noted at insertion site, patient denies c/o pain - only slight tenderness at site.  Dressing with slight pressure applied.  D/c Instructions-Education: Discharge instructions given to patient/family with verbalized understanding. D/c education completed with patient/family including follow up instructions, medication  list, d/c activities limitations if indicated, with other d/c instructions as indicated by MD - patient able to verbalize understanding, all questions fully answered. Patient instructed to return to ED, call 911, or call MD for any changes in condition.  Patient escorted via WC, and D/C home via private auto.  Adair Laundry, RN 05/25/2018 3:45 PM

## 2018-05-26 ENCOUNTER — Encounter (HOSPITAL_COMMUNITY): Payer: Self-pay

## 2018-05-26 ENCOUNTER — Encounter (HOSPITAL_COMMUNITY): Payer: Self-pay | Admitting: *Deleted

## 2018-05-26 ENCOUNTER — Emergency Department (HOSPITAL_COMMUNITY)
Admission: EM | Admit: 2018-05-26 | Discharge: 2018-05-26 | Disposition: A | Discharge status: Home / Self Care | Payer: BLUE CROSS/BLUE SHIELD | Attending: Emergency Medicine | Admitting: Emergency Medicine

## 2018-05-26 ENCOUNTER — Emergency Department (HOSPITAL_COMMUNITY): Payer: BLUE CROSS/BLUE SHIELD

## 2018-05-26 ENCOUNTER — Other Ambulatory Visit: Payer: Self-pay

## 2018-05-26 ENCOUNTER — Inpatient Hospital Stay (HOSPITAL_COMMUNITY): Admit: 2018-05-26 | Payer: BLUE CROSS/BLUE SHIELD | Admitting: Neurosurgery

## 2018-05-26 DIAGNOSIS — T8131XA Disruption of external operation (surgical) wound, not elsewhere classified, initial encounter: Secondary | ICD-10-CM | POA: Diagnosis not present

## 2018-05-26 DIAGNOSIS — L24A9 Irritant contact dermatitis due friction or contact with other specified body fluids: Secondary | ICD-10-CM

## 2018-05-26 DIAGNOSIS — Z4801 Encounter for change or removal of surgical wound dressing: Secondary | ICD-10-CM | POA: Diagnosis present

## 2018-05-26 DIAGNOSIS — R05 Cough: Secondary | ICD-10-CM

## 2018-05-26 DIAGNOSIS — T8189XA Other complications of procedures, not elsewhere classified, initial encounter: Secondary | ICD-10-CM | POA: Diagnosis not present

## 2018-05-26 DIAGNOSIS — R531 Weakness: Secondary | ICD-10-CM | POA: Insufficient documentation

## 2018-05-26 DIAGNOSIS — T148XXA Other injury of unspecified body region, initial encounter: Secondary | ICD-10-CM

## 2018-05-26 DIAGNOSIS — R059 Cough, unspecified: Secondary | ICD-10-CM

## 2018-05-26 DIAGNOSIS — Y69 Unspecified misadventure during surgical and medical care: Secondary | ICD-10-CM | POA: Insufficient documentation

## 2018-05-26 LAB — BASIC METABOLIC PANEL
ANION GAP: 9 (ref 5–15)
BUN: 31 mg/dL — ABNORMAL HIGH (ref 8–23)
CHLORIDE: 103 mmol/L (ref 98–111)
CO2: 26 mmol/L (ref 22–32)
CREATININE: 1.47 mg/dL — AB (ref 0.61–1.24)
Calcium: 8.6 mg/dL — ABNORMAL LOW (ref 8.9–10.3)
GFR calc Af Amer: 53 mL/min — ABNORMAL LOW (ref >60)
GFR calc non Af Amer: 46 mL/min — ABNORMAL LOW (ref >60)
GLUCOSE: 123 mg/dL — AB (ref 70–99)
Potassium: 4.4 mmol/L (ref 3.5–5.1)
Sodium: 138 mmol/L (ref 135–145)

## 2018-05-26 LAB — CBC WITH DIFFERENTIAL/PLATELET
Abs Immature Granulocytes: 0.1 10*3/uL (ref 0.0–0.1)
Basophils Absolute: 0 10*3/uL (ref 0.0–0.1)
Basophils Relative: 0 %
EOS ABS: 0 10*3/uL (ref 0.0–0.7)
EOS PCT: 0 %
HEMATOCRIT: 28.5 % — AB (ref 39.0–52.0)
HEMOGLOBIN: 9.3 g/dL — AB (ref 13.0–17.0)
Immature Granulocytes: 1 %
LYMPHS PCT: 9 %
Lymphs Abs: 1 10*3/uL (ref 0.7–4.0)
MCH: 29.7 pg (ref 26.0–34.0)
MCHC: 32.6 g/dL (ref 30.0–36.0)
MCV: 91.1 fL (ref 78.0–100.0)
MONO ABS: 0.8 10*3/uL (ref 0.1–1.0)
MONOS PCT: 7 %
Neutro Abs: 9.7 10*3/uL — ABNORMAL HIGH (ref 1.7–7.7)
Neutrophils Relative %: 83 %
Platelets: 187 10*3/uL (ref 150–400)
RBC: 3.13 MIL/uL — ABNORMAL LOW (ref 4.22–5.81)
RDW: 11.9 % (ref 11.5–15.5)
WBC: 11.6 10*3/uL — ABNORMAL HIGH (ref 4.0–10.5)

## 2018-05-26 LAB — URINALYSIS, ROUTINE W REFLEX MICROSCOPIC
BILIRUBIN URINE: NEGATIVE
GLUCOSE, UA: NEGATIVE mg/dL
HGB URINE DIPSTICK: NEGATIVE
Ketones, ur: NEGATIVE mg/dL
Leukocytes, UA: NEGATIVE
Nitrite: NEGATIVE
PH: 6 (ref 5.0–8.0)
Protein, ur: NEGATIVE mg/dL
SPECIFIC GRAVITY, URINE: 1.013 (ref 1.005–1.030)

## 2018-05-26 SURGERY — ANTERIOR CERVICAL DECOMPRESSION/DISCECTOMY FUSION 4 LEVELS
Anesthesia: General

## 2018-05-26 MED ORDER — HYDROCODONE-ACETAMINOPHEN 5-325 MG PO TABS
1.0000 | ORAL_TABLET | Freq: Once | ORAL | Status: AC
  Administered 2018-05-26: 1 via ORAL
  Filled 2018-05-26: qty 1

## 2018-05-26 MED ORDER — THROMBIN 20000 UNITS EX SOLR
CUTANEOUS | Status: DC | PRN
  Administered 2018-05-26: 16:00:00 via TOPICAL

## 2018-05-26 MED ORDER — HYDROCODONE-ACETAMINOPHEN 5-325 MG PO TABS
1.0000 | ORAL_TABLET | Freq: Once | ORAL | Status: DC
  Filled 2018-05-26: qty 1

## 2018-05-26 MED FILL — Thrombin (Recombinant) For Soln 20000 Unit: CUTANEOUS | Qty: 1 | Status: AC

## 2018-05-26 NOTE — ED Provider Notes (Signed)
[facility] EMERGENCY DEPARTMENT Provider Note   CSN: [id number] Arrival date & time: 05/26/18  9562     History   Chief Complaint Chief Complaint  Patient presents with  . Post-op Problem    HPI Ian Ramos is a 72 y.o. male.  The history is provided by the patient and medical records. No language interpreter was used.  Wound Check  This is a new problem. The current episode started more than 2 days ago. The problem occurs constantly. The problem has not changed since onset.Pertinent negatives include no chest pain, no abdominal pain, no headaches and no shortness of breath. Nothing aggravates the symptoms. Nothing relieves the symptoms. He has tried nothing for the symptoms. The treatment provided no relief.    Past Medical History:  Diagnosis Date  . Arthritis   . Neuromuscular disorder [facility])     Patient Active Problem List   Diagnosis Date Noted  . Status post cervical spinal fusion 05/23/2018  . Cervical myelopathy (HCC) 05/23/2018  . Left arm weakness 09/20/2017  . Brachial plexopathy 09/16/2017  . Need for pneumococcal vaccination 09/16/2017  . Need for influenza vaccination 09/16/2017    History reviewed. No pertinent surgical history.      Home Medications    Prior to Admission medications   Medication Sig Start Date End Date Taking? Authorizing Provider  diazepam (VALIUM) 5 MG tablet Take 2 pills 30 minutes prior to procedure. May repeat one time if needed. Patient taking differently: Take 10 mg by mouth See admin instructions. Take 10 mg by mouth 30 minutes prior to procedure. May repeat one time if needed. 12/23/17   Myra Rude, MD    Family History No family history on file.  Social History Social History   Tobacco Use  . Smoking status: Never Smoker  . Smokeless tobacco: Current User    Types: Chew  Substance Use Topics  . Alcohol use: No    Frequency: Never  . Drug use: No     Allergies   Patient has  no known allergies.   Review of Systems Review of Systems  Constitutional: Positive for fatigue. Negative for chills, diaphoresis and fever.  HENT: Negative for congestion.   Eyes: Negative for photophobia and visual disturbance.  Respiratory: Positive for cough. Negative for chest tightness, shortness of breath and wheezing.   Cardiovascular: Negative for chest pain and palpitations.  Gastrointestinal: Negative for abdominal pain, constipation, diarrhea, nausea and vomiting.  Genitourinary: Negative for flank pain and frequency.  Musculoskeletal: Positive for neck pain (unchanged). Negative for back pain and neck stiffness.  Skin: Positive for wound.  Neurological: Negative for dizziness, seizures, speech difficulty, weakness, light-headedness, numbness and headaches.  Psychiatric/Behavioral: Negative for agitation.  All other systems reviewed and are negative.    Physical Exam Updated Vital Signs BP 118/65 (BP Location: Right Arm)   Pulse (!) 104   Temp 98.5 F (36.9 C) (Oral)   Resp 20   SpO2 99%   Physical Exam  Constitutional: He is oriented to person, place, and time. He appears well-developed and well-nourished. No distress.  HENT:  Head: Normocephalic and atraumatic.  Nose: Nose normal.  Mouth/Throat: Oropharynx is clear and moist. No oropharyngeal exudate.  Eyes: Pupils are equal, round, and reactive to light. Conjunctivae and EOM are normal.  Neck: Neck supple. No spinous process tenderness and no muscular tenderness present. No neck rigidity. Normal range of motion present.    Cardiovascular: Normal rate and regular rhythm.  No  murmur heard. Pulmonary/Chest: Effort normal and breath sounds normal. No respiratory distress. He has no wheezes. He has no rales. He exhibits no tenderness.  Abdominal: Soft. There is no tenderness. There is no guarding.  Musculoskeletal: He exhibits no edema.  Neurological: He is alert and oriented to person, place, and time. No  cranial nerve deficit or sensory deficit. He exhibits normal muscle tone. Coordination normal.  Skin: Skin is warm and dry. Capillary refill takes less than 2 seconds. He is not diaphoretic. No erythema. No pallor.  Psychiatric: He has a normal mood and affect.  Nursing note and vitals reviewed.    ED Treatments / Results  Labs (all labs ordered are listed, but only abnormal results are displayed) Labs Reviewed  URINALYSIS, ROUTINE W REFLEX MICROSCOPIC - Abnormal; Notable for the following components:      Result Value   Color, Urine STRAW (*)    All other components within normal limits  CBC WITH DIFFERENTIAL/PLATELET - Abnormal; Notable for the following components:   WBC 11.6 (*)    RBC 3.13 (*)    Hemoglobin 9.3 (*)    HCT 28.5 (*)    Neutro Abs 9.7 (*)    All other components within normal limits  BASIC METABOLIC PANEL - Abnormal; Notable for the following components:   Glucose, Bld 123 (*)    BUN 31 (*)    Creatinine, Ser 1.47 (*)    Calcium 8.6 (*)    GFR calc non Af Amer 46 (*)    GFR calc Af Amer 53 (*)    All other components within normal limits  URINE CULTURE    EKG None  Radiology Dg Chest 2 View  Result Date: 05/26/2018 CLINICAL DATA:  72 year old male with weakness and cough. Status post recent cervical spine surgery. EXAM: CHEST - 2 VIEW COMPARISON:  None. FINDINGS: Cardiac and mediastinal contours are within normal limits. Atherosclerotic calcifications are present within the transverse aorta. Mild linear opacity in the lingula most consistent with atelectasis. No evidence of pulmonary edema, pleural effusion or pneumothorax. No suspicious pulmonary nodule or mass. Degenerative changes are present in both shoulders including the acromioclavicular joints and glenohumeral joints. High-riding humeral heads bilaterally with decreased subacromial space consistent with chronic rotator cuff injuries. No acute osseous abnormality. IMPRESSION: 1. Linear opacity in the  lingula most consistent with atelectasis. 2.  Aortic Atherosclerosis (ICD10-170.0) 3. Bilateral degenerative changes in the glenohumeral and acromioclavicular joints. Electronically Signed   By: Malachy Moan M.D.   On: 05/26/2018 08:01    Procedures Procedures (including critical care time)  Medications Ordered in ED Medications  HYDROcodone-acetaminophen (NORCO/VICODIN) 5-325 MG per tablet 1 tablet (1 tablet Oral Given 05/26/18 1031)     Initial Impression / Assessment and Plan / ED Course  I have reviewed the triage vital signs and the nursing notes.  Pertinent labs & imaging results that were available during my care of the patient were reviewed by me and considered in my medical decision making (see chart for details).     FITZGERALD DUNNE is a 72 y.o. male with a past medical history significant for arthritis and recent cervical spine surgery who presents with concerns about his wound.  He reports that he was discharged yesterday after having surgery 4 days ago on his cervical spine.  Appears he had C1-C6 fused.  He says that after leaving the hospital he has been doing fairly well with pain taking his hydrocodone.  He says that last evening the wound  started draining.  He says that it was draining a serosanguineous type fluid.  He reports that after waking up this morning it continued to drain and his bed was soaked as well as his wound.  The family expressed concern that the wound might be reopening and brought him to the ED.  Patient does report feeling fatigued, having a dry cough, and feeling general malaise.  He denies headaches, vision changes, nausea, vomiting, or new numbness, tingling, or weakness of extremities.  He denies any other complaints.  Family reports that the patient's wound was not supposed to be changed until tomorrow.  On my initial inspection, the bandage was peeling up on the bottom and was soaked with serosanguineous drainage.  I peeled up the open area and  saw his Steri-Strips were soaked and not holding well.  There did not appear to be purulence but there was serosanguineous drainage coming down his back.  Patient's lungs were clear and chest was nontender.  Patient's abdomen was nontender.  Neck and surgical site were nontender.  Patient had symmetric grip strength, normal sensation in arms and legs.  He denied bowel or bladder incontinence or other complaints.  Clinically I suspect the patient's wound is having mild dehiscence causing his severe sinus drainage.  With his reported cough and malaise will obtain chest x-ray, urinalysis, and screening blood work.  We will touch base with neurosurgery to come have them inspect his wound.  If they feel wound is reassuring, will likely discharge patient for outpatient follow-up.  7:58 AM Spoke with neurosurgery who is going to get back with me.  They say there is going to come evaluate the wound or have [place] redressed that and see him in clinic in several days.  Anticipate following up on their recommendations and his brief work-up to look for postoperative infection.  Given the lack of neck tenderness, fever, or purulence, low suspicion for site infection.  8:13 AM Neurosurgery came to see the patient and felt it was healing well.  They suspect there was postoperative fluid draining out.  They requested the wound redressed and they will see him in clinic.  Patient was pleased with this evaluation report but he does want to stay and get the urinalysis and blood work.  The chest x-ray does not show pneumonia but there is some mild atelectasis.  Anticipate discharge after redressing and diagnostic testing.  Patient's laboratory testing showed worsened anemia from prior, suspect this is due to his recent surgery.  Urinalysis did not show infection.  X-ray did not show pneumonia.  Wound was redressed and patient was given return precautions all instructions.  Patient had no other questions or concerns and was  discharged in good condition with understanding of plan of care.  Final Clinical Impressions(s) / ED Diagnoses   Final diagnoses:  Wound drainage  Cough    ED Discharge Orders    None      Clinical Impression: 1. Wound drainage   2. Cough     Disposition: Discharge  Condition: Good  I have discussed the results, Dx and Tx plan with the pt(& family if present). He/she/they expressed understanding and agree(s) with the plan. Discharge instructions discussed at great length. Strict return precautions discussed and pt &/or family have verbalized understanding of the instructions. No further questions at time of discharge.    Discharge Medication List as of 05/26/2018 10:15 AM      Follow Up: [facility] EMERGENCY DEPARTMENT [address]  [id number] mc [place] [address] [phone]    your neurosurgeon        Tegeler, Canary Brim, MD 05/26/18 [phone]

## 2018-05-26 NOTE — Progress Notes (Signed)
Patient 3 days status post posterior cervical decompression and fusion.  Patient presents today with complaints of wound drainage.  No fever.  No evidence of CSF leakage.  Wound drainage serosanguineous in nature.  Motor and sensory exam are stable with continued left-sided shoulder abduction weakness but overall improvement of his strength in both distal upper extremities and bilateral legs.  I see no evidence of wound infection or more serious problem.  Patient's wound should be redressed.  I would recommend him going home on prophylactic oral antibiotics for 1 week Keflex would be fine.  I will plan on seeing him back next week in the office.

## 2018-05-26 NOTE — ED Notes (Signed)
Dressing to midline cervical area changed; wound edges approximated; steri-strips intact with serosanguinous drainage noted; telfa applied then 4x4s placed; secured with tegraderm  - pt tolerated procedure well

## 2018-05-26 NOTE — Discharge Instructions (Signed)
Your wound was felt to be appropriate after the neurosurgery evaluation.  We will redressed the wound.  Your urinalysis did not show infection and the labs showed anemia likely due to your surgery.  Please follow-up with your primary doctor and your neurosurgeon as directed.  Please watch for infections.  If any symptoms change or worsen, please return to the nearest emergency department.

## 2018-05-26 NOTE — ED Triage Notes (Signed)
Pt had cervical surgery on Friday, reports having surgery on 6 of his 7 nerves in his neck. Was discharged yesterday afternoon. This morning pt reports increased drainage through the bandage that was placed at discharge. Bloody drainage noted through bandange and on pts shirt. Also reports ringing in his ears that has started since this morning

## 2018-05-26 NOTE — ED Notes (Signed)
Pt is unable to provide sample of urine at this time. Pt stated, "I don't know if I can do that." Urinal was left at bedside. Pt is aware that urine sample is needed. Will try again later.

## 2018-05-27 ENCOUNTER — Encounter (HOSPITAL_COMMUNITY): Payer: Self-pay | Admitting: Neurosurgery

## 2018-05-27 LAB — URINE CULTURE: CULTURE: NO GROWTH

## 2018-05-28 ENCOUNTER — Other Ambulatory Visit: Payer: Self-pay

## 2018-05-28 ENCOUNTER — Emergency Department (HOSPITAL_COMMUNITY): Payer: BLUE CROSS/BLUE SHIELD

## 2018-05-28 ENCOUNTER — Encounter (HOSPITAL_COMMUNITY): Payer: Self-pay

## 2018-05-28 ENCOUNTER — Inpatient Hospital Stay (HOSPITAL_COMMUNITY)
Admission: EM | Admit: 2018-05-28 | Discharge: 2018-06-19 | DRG: 286 | Disposition: A | Discharge status: Rehabilitation Facility | Payer: BLUE CROSS/BLUE SHIELD | Attending: Internal Medicine | Admitting: Internal Medicine

## 2018-05-28 DIAGNOSIS — F17220 Nicotine dependence, chewing tobacco, uncomplicated: Secondary | ICD-10-CM | POA: Diagnosis present

## 2018-05-28 DIAGNOSIS — R29810 Facial weakness: Secondary | ICD-10-CM | POA: Diagnosis not present

## 2018-05-28 DIAGNOSIS — G9340 Encephalopathy, unspecified: Secondary | ICD-10-CM

## 2018-05-28 DIAGNOSIS — I10 Essential (primary) hypertension: Secondary | ICD-10-CM

## 2018-05-28 DIAGNOSIS — K5909 Other constipation: Secondary | ICD-10-CM | POA: Diagnosis present

## 2018-05-28 DIAGNOSIS — I2510 Atherosclerotic heart disease of native coronary artery without angina pectoris: Principal | ICD-10-CM | POA: Diagnosis present

## 2018-05-28 DIAGNOSIS — J9601 Acute respiratory failure with hypoxia: Secondary | ICD-10-CM | POA: Diagnosis not present

## 2018-05-28 DIAGNOSIS — E875 Hyperkalemia: Secondary | ICD-10-CM | POA: Diagnosis present

## 2018-05-28 DIAGNOSIS — M25512 Pain in left shoulder: Secondary | ICD-10-CM | POA: Diagnosis present

## 2018-05-28 DIAGNOSIS — R29711 NIHSS score 11: Secondary | ICD-10-CM | POA: Diagnosis not present

## 2018-05-28 DIAGNOSIS — G992 Myelopathy in diseases classified elsewhere: Secondary | ICD-10-CM | POA: Diagnosis present

## 2018-05-28 DIAGNOSIS — K228 Other specified diseases of esophagus: Secondary | ICD-10-CM

## 2018-05-28 DIAGNOSIS — R079 Chest pain, unspecified: Secondary | ICD-10-CM | POA: Diagnosis present

## 2018-05-28 DIAGNOSIS — Z4659 Encounter for fitting and adjustment of other gastrointestinal appliance and device: Secondary | ICD-10-CM

## 2018-05-28 DIAGNOSIS — R0789 Other chest pain: Secondary | ICD-10-CM | POA: Diagnosis not present

## 2018-05-28 DIAGNOSIS — Z72820 Sleep deprivation: Secondary | ICD-10-CM

## 2018-05-28 DIAGNOSIS — I5023 Acute on chronic systolic (congestive) heart failure: Secondary | ICD-10-CM | POA: Diagnosis present

## 2018-05-28 DIAGNOSIS — Z79899 Other long term (current) drug therapy: Secondary | ICD-10-CM

## 2018-05-28 DIAGNOSIS — N183 Chronic kidney disease, stage 3 (moderate): Secondary | ICD-10-CM | POA: Diagnosis present

## 2018-05-28 DIAGNOSIS — D638 Anemia in other chronic diseases classified elsewhere: Secondary | ICD-10-CM | POA: Diagnosis present

## 2018-05-28 DIAGNOSIS — Z9289 Personal history of other medical treatment: Secondary | ICD-10-CM

## 2018-05-28 DIAGNOSIS — Z981 Arthrodesis status: Secondary | ICD-10-CM

## 2018-05-28 DIAGNOSIS — E785 Hyperlipidemia, unspecified: Secondary | ICD-10-CM | POA: Diagnosis present

## 2018-05-28 DIAGNOSIS — G9341 Metabolic encephalopathy: Secondary | ICD-10-CM | POA: Diagnosis not present

## 2018-05-28 DIAGNOSIS — Z452 Encounter for adjustment and management of vascular access device: Secondary | ICD-10-CM

## 2018-05-28 DIAGNOSIS — K209 Esophagitis, unspecified without bleeding: Secondary | ICD-10-CM

## 2018-05-28 DIAGNOSIS — I440 Atrioventricular block, first degree: Secondary | ICD-10-CM | POA: Diagnosis present

## 2018-05-28 DIAGNOSIS — R52 Pain, unspecified: Secondary | ICD-10-CM | POA: Diagnosis present

## 2018-05-28 DIAGNOSIS — R072 Precordial pain: Secondary | ICD-10-CM | POA: Diagnosis not present

## 2018-05-28 DIAGNOSIS — I6389 Other cerebral infarction: Secondary | ICD-10-CM

## 2018-05-28 DIAGNOSIS — I5021 Acute systolic (congestive) heart failure: Secondary | ICD-10-CM

## 2018-05-28 DIAGNOSIS — G959 Disease of spinal cord, unspecified: Secondary | ICD-10-CM | POA: Diagnosis present

## 2018-05-28 DIAGNOSIS — I2583 Coronary atherosclerosis due to lipid rich plaque: Secondary | ICD-10-CM | POA: Diagnosis present

## 2018-05-28 DIAGNOSIS — M4802 Spinal stenosis, cervical region: Secondary | ICD-10-CM | POA: Diagnosis present

## 2018-05-28 DIAGNOSIS — R4701 Aphasia: Secondary | ICD-10-CM | POA: Diagnosis not present

## 2018-05-28 DIAGNOSIS — R451 Restlessness and agitation: Secondary | ICD-10-CM | POA: Diagnosis not present

## 2018-05-28 DIAGNOSIS — Z8249 Family history of ischemic heart disease and other diseases of the circulatory system: Secondary | ICD-10-CM

## 2018-05-28 DIAGNOSIS — R339 Retention of urine, unspecified: Secondary | ICD-10-CM | POA: Diagnosis present

## 2018-05-28 DIAGNOSIS — K224 Dyskinesia of esophagus: Secondary | ICD-10-CM | POA: Diagnosis present

## 2018-05-28 DIAGNOSIS — I63233 Cerebral infarction due to unspecified occlusion or stenosis of bilateral carotid arteries: Secondary | ICD-10-CM | POA: Diagnosis not present

## 2018-05-28 DIAGNOSIS — D649 Anemia, unspecified: Secondary | ICD-10-CM

## 2018-05-28 DIAGNOSIS — I447 Left bundle-branch block, unspecified: Secondary | ICD-10-CM | POA: Diagnosis not present

## 2018-05-28 DIAGNOSIS — N179 Acute kidney failure, unspecified: Secondary | ICD-10-CM | POA: Diagnosis present

## 2018-05-28 DIAGNOSIS — I420 Dilated cardiomyopathy: Secondary | ICD-10-CM | POA: Diagnosis present

## 2018-05-28 DIAGNOSIS — Z9911 Dependence on respirator [ventilator] status: Secondary | ICD-10-CM

## 2018-05-28 DIAGNOSIS — J9600 Acute respiratory failure, unspecified whether with hypoxia or hypercapnia: Secondary | ICD-10-CM

## 2018-05-28 DIAGNOSIS — N1832 Chronic kidney disease, stage 3b: Secondary | ICD-10-CM | POA: Diagnosis present

## 2018-05-28 DIAGNOSIS — K2289 Other specified disease of esophagus: Secondary | ICD-10-CM

## 2018-05-28 DIAGNOSIS — R34 Anuria and oliguria: Secondary | ICD-10-CM | POA: Diagnosis not present

## 2018-05-28 DIAGNOSIS — I130 Hypertensive heart and chronic kidney disease with heart failure and stage 1 through stage 4 chronic kidney disease, or unspecified chronic kidney disease: Secondary | ICD-10-CM | POA: Diagnosis present

## 2018-05-28 DIAGNOSIS — I959 Hypotension, unspecified: Secondary | ICD-10-CM | POA: Diagnosis not present

## 2018-05-28 DIAGNOSIS — E041 Nontoxic single thyroid nodule: Secondary | ICD-10-CM | POA: Diagnosis present

## 2018-05-28 LAB — BASIC METABOLIC PANEL
Anion gap: 9 (ref 5–15)
BUN: 26 mg/dL — ABNORMAL HIGH (ref 8–23)
CHLORIDE: 101 mmol/L (ref 98–111)
CO2: 27 mmol/L (ref 22–32)
Calcium: 8.8 mg/dL — ABNORMAL LOW (ref 8.9–10.3)
Creatinine, Ser: 1.39 mg/dL — ABNORMAL HIGH (ref 0.61–1.24)
GFR calc non Af Amer: 49 mL/min — ABNORMAL LOW (ref >60)
GFR, EST AFRICAN AMERICAN: 56 mL/min — AB (ref >60)
Glucose, Bld: 126 mg/dL — ABNORMAL HIGH (ref 70–99)
POTASSIUM: 4.4 mmol/L (ref 3.5–5.1)
SODIUM: 137 mmol/L (ref 135–145)

## 2018-05-28 LAB — CBC
HEMATOCRIT: 31.4 % — AB (ref 39.0–52.0)
Hemoglobin: 10.3 g/dL — ABNORMAL LOW (ref 13.0–17.0)
MCH: 29.3 pg (ref 26.0–34.0)
MCHC: 32.8 g/dL (ref 30.0–36.0)
MCV: 89.5 fL (ref 78.0–100.0)
Platelets: 224 10*3/uL (ref 150–400)
RBC: 3.51 MIL/uL — AB (ref 4.22–5.81)
RDW: 11.7 % (ref 11.5–15.5)
WBC: 10.5 10*3/uL (ref 4.0–10.5)

## 2018-05-28 LAB — I-STAT TROPONIN, ED
TROPONIN I, POC: 0 ng/mL (ref 0.00–0.08)
Troponin i, poc: 0.04 ng/mL (ref 0.00–0.08)

## 2018-05-28 LAB — TROPONIN I: Troponin I: <0.03 ng/mL (ref <0.03)

## 2018-05-28 MED ORDER — GI COCKTAIL ~~LOC~~: 30.0000 mL | Freq: Three times a day (TID) | ORAL | Status: DC | PRN

## 2018-05-28 MED ORDER — MORPHINE SULFATE (PF) 2 MG/ML IV SOLN: 2.0000 mg | INTRAVENOUS | Status: DC | PRN

## 2018-05-28 MED ORDER — SODIUM CHLORIDE 0.9 % IV SOLN
INTRAVENOUS | Status: AC
  Administered 2018-05-28: 23:00:00 via INTRAVENOUS

## 2018-05-28 MED ORDER — FAMOTIDINE IN NACL 20-0.9 MG/50ML-% IV SOLN
20.0000 mg | Freq: Two times a day (BID) | INTRAVENOUS | Status: DC
  Administered 2018-05-29: 20 mg via INTRAVENOUS
  Filled 2018-05-28: qty 50

## 2018-05-28 MED ORDER — ASPIRIN EC 81 MG PO TBEC
81.0000 mg | DELAYED_RELEASE_TABLET | Freq: Every day | ORAL | Status: DC
  Administered 2018-05-29 – 2018-06-09 (×12): 81 mg via ORAL
  Filled 2018-05-28 (×13): qty 1

## 2018-05-28 MED ORDER — OXYCODONE-ACETAMINOPHEN 5-325 MG PO TABS
1.0000 | ORAL_TABLET | Freq: Four times a day (QID) | ORAL | Status: DC | PRN
  Administered 2018-05-29 – 2018-06-05 (×12): 1 via ORAL
  Filled 2018-05-28 (×14): qty 1

## 2018-05-28 MED ORDER — FAMOTIDINE IN NACL 20-0.9 MG/50ML-% IV SOLN
20.0000 mg | Freq: Once | INTRAVENOUS | Status: AC
  Administered 2018-05-28: 20 mg via INTRAVENOUS
  Filled 2018-05-28: qty 50

## 2018-05-28 MED ORDER — DEXAMETHASONE SODIUM PHOSPHATE 10 MG/ML IJ SOLN
10.0000 mg | Freq: Once | INTRAMUSCULAR | Status: AC
  Administered 2018-05-28: 10 mg via INTRAVENOUS
  Filled 2018-05-28: qty 1

## 2018-05-28 MED ORDER — IOPAMIDOL (ISOVUE-370) INJECTION 76%
INTRAVENOUS | Status: AC
  Filled 2018-05-28: qty 100

## 2018-05-28 MED ORDER — ONDANSETRON HCL 4 MG/2ML IJ SOLN
4.0000 mg | Freq: Four times a day (QID) | INTRAMUSCULAR | Status: DC | PRN
  Administered 2018-06-08: 4 mg via INTRAVENOUS
  Filled 2018-05-28: qty 2

## 2018-05-28 MED ORDER — SENNA 8.6 MG PO TABS
1.0000 | ORAL_TABLET | Freq: Every day | ORAL | Status: DC | PRN
  Administered 2018-05-31 – 2018-06-02 (×3): 8.6 mg via ORAL
  Filled 2018-05-28 (×3): qty 1

## 2018-05-28 MED ORDER — NITROGLYCERIN 0.4 MG SL SUBL: 0.4000 mg | SUBLINGUAL_TABLET | SUBLINGUAL | Status: DC | PRN

## 2018-05-28 MED ORDER — ALPRAZOLAM 0.25 MG PO TABS
0.2500 mg | ORAL_TABLET | Freq: Two times a day (BID) | ORAL | Status: DC | PRN
  Administered 2018-05-31 – 2018-06-08 (×2): 0.25 mg via ORAL
  Filled 2018-05-28 (×2): qty 1

## 2018-05-28 MED ORDER — FENTANYL CITRATE (PF) 100 MCG/2ML IJ SOLN: 50.0000 ug | Freq: Once | INTRAMUSCULAR | Status: DC

## 2018-05-28 MED ORDER — ONDANSETRON HCL 4 MG/2ML IJ SOLN
4.0000 mg | Freq: Once | INTRAMUSCULAR | Status: AC
  Administered 2018-05-28: 4 mg via INTRAVENOUS
  Filled 2018-05-28: qty 2

## 2018-05-28 MED ORDER — ZOLPIDEM TARTRATE 5 MG PO TABS: 5.0000 mg | ORAL_TABLET | Freq: Every evening | ORAL | Status: DC | PRN

## 2018-05-28 MED ORDER — ACETAMINOPHEN 325 MG PO TABS
650.0000 mg | ORAL_TABLET | ORAL | Status: DC | PRN
  Administered 2018-05-31 – 2018-06-07 (×2): 650 mg via ORAL
  Filled 2018-05-28 (×3): qty 2

## 2018-05-28 MED ORDER — FENTANYL CITRATE (PF) 100 MCG/2ML IJ SOLN
100.0000 ug | Freq: Once | INTRAMUSCULAR | Status: AC
  Administered 2018-05-28: 100 ug via INTRAVENOUS
  Filled 2018-05-28: qty 2

## 2018-05-28 MED ORDER — IOPAMIDOL (ISOVUE-370) INJECTION 76%
100.0000 mL | Freq: Once | INTRAVENOUS | Status: AC | PRN
  Administered 2018-05-28: 100 mL via INTRAVENOUS

## 2018-05-28 MED ORDER — MORPHINE SULFATE (PF) 2 MG/ML IV SOLN
2.0000 mg | INTRAVENOUS | Status: DC | PRN
  Administered 2018-05-29 – 2018-06-04 (×8): 2 mg via INTRAVENOUS
  Filled 2018-05-28 (×10): qty 1

## 2018-05-28 MED ORDER — MORPHINE SULFATE (PF) 4 MG/ML IV SOLN
4.0000 mg | Freq: Once | INTRAVENOUS | Status: AC
  Administered 2018-05-28: 4 mg via INTRAVENOUS
  Filled 2018-05-28: qty 1

## 2018-05-28 MED ORDER — BISACODYL 5 MG PO TBEC
5.0000 mg | DELAYED_RELEASE_TABLET | Freq: Every day | ORAL | Status: DC | PRN
  Administered 2018-06-01 – 2018-06-02 (×2): 5 mg via ORAL
  Filled 2018-05-28 (×2): qty 1

## 2018-05-28 MED ORDER — SODIUM CHLORIDE 0.9 % IV BOLUS
1000.0000 mL | Freq: Once | INTRAVENOUS | Status: AC
  Administered 2018-05-28: 1000 mL via INTRAVENOUS

## 2018-05-28 MED ORDER — OXYCODONE-ACETAMINOPHEN 5-325 MG PO TABS
2.0000 | ORAL_TABLET | Freq: Once | ORAL | Status: AC
  Administered 2018-05-28: 2 via ORAL
  Filled 2018-05-28: qty 2

## 2018-05-28 NOTE — ED Notes (Signed)
Attempted to have patient sit up in bed by himself ;pt unable to sit up in bed without assistance ; pt also states " I cant do it without my chest pain getting worse "

## 2018-05-28 NOTE — ED Provider Notes (Signed)
[facility] EMERGENCY DEPARTMENT Provider Note   CSN: [id number] Arrival date & time: 05/28/18  1030     History   Chief Complaint Chief Complaint  Patient presents with  . Chest Pain    HPI Ian Ramos is a 72 y.o. male.  Who presents the emergency department with chief complaint of chest pain.  This is a 72 year old man who is status post C1-C6 posterior cervical fusion surgery for myelopathy/left brachial plexopathy by Dr. Lelon Perla on 05/23/2018.  The patient was seen back in the ER on 8/26 with concern of drainage from his incision site.  Patient also has reported malaise and dry cough.  He was evaluated in the ER and chest x-ray was negative for acute abnormality.  His wound was evaluated by the ER clinician as well as Dr. Lelon Perla and appeared well healing.  Patient has been doing well at home otherwise and taking hydrocodone.  He reports that last night he began having some pain between his shoulder blades in the upper back which was moderate but tolerable.  This morning when he awoke around 4 AM he started having severe pain in the front of his chest.  He took a hydrocodone without relief of his symptoms.  He states that his pain became worse and he was unable to get out of the bed so his sister is been helping care for him called 911 and he was transported.  He denies pain down his arms, diaphoresis, shortness of breath, nausea or vomiting.  He states that this pain is completely different from any of the pain he has had associated with his neck or neck surgery.  He denies difficulty swallowing, fevers or upper extremity weakness.  He has no known past medical history but does not particularly see physicians on a regular basis.  He denies unilateral leg swelling, hemoptysis.  He states that his pain is better when he lies down and on the left side and worse when he tries to sit up.  He has significant difficulty finding a position of comfort.  HPI  Past  Medical History:  Diagnosis Date  . Arthritis   . Neuromuscular disorder [facility])     Patient Active Problem List   Diagnosis Date Noted  . Status post cervical spinal fusion 05/23/2018  . Cervical myelopathy (HCC) 05/23/2018  . Left arm weakness 09/20/2017  . Brachial plexopathy 09/16/2017  . Need for pneumococcal vaccination 09/16/2017  . Need for influenza vaccination 09/16/2017    Past Surgical History:  Procedure Laterality Date  . POSTERIOR CERVICAL FUSION/FORAMINOTOMY N/A 05/23/2018   Procedure: Posterior Cervical Fusion with lateral mass fixation - C1 - C6 with laminectomy;  Surgeon: Julio Sicks, MD;  Location: [facility] OR;  Service: Neurosurgery;  Laterality: N/A;        Home Medications    Prior to Admission medications   Medication Sig Start Date End Date Taking? Authorizing Provider  diazepam (VALIUM) 5 MG tablet Take 2 pills 30 minutes prior to procedure. May repeat one time if needed. Patient not taking: Reported on 05/26/2018 12/23/17   Myra Rude, MD  HYDROcodone-acetaminophen [facility]) 10-325 MG tablet Take 1 tablet by mouth every 6 (six) hours as needed for moderate pain.    [provider]    Family History No family history on file.  Social History Social History   Tobacco Use  . Smoking status: Never Smoker  . Smokeless tobacco: Current User    Types: Chew  Substance Use  Topics  . Alcohol use: No    Frequency: Never  . Drug use: No     Allergies   Patient has no known allergies.   Review of Systems Review of Systems  Ten systems reviewed and are negative for acute change, except as noted in the HPI.   Physical Exam Updated Vital Signs BP (!) 170/96   Pulse (!) 59   Temp 97.8 F (36.6 C) (Oral)   Resp 19   Ht 5\' 7"  (1.702 m)   Wt 79.4 kg   SpO2 98%   BMI 27.41 kg/m   Physical Exam  Constitutional: He is oriented to person, place, and time. He appears well-developed and well-nourished. No distress.  Patient appears  uncomfortable and irritable.  He is squirming around on the bed and rolling back and forth.  HENT:  Head: Normocephalic and atraumatic.  Eyes: Pupils are equal, round, and reactive to light. Conjunctivae and EOM are normal. No scleral icterus.  Neck: Normal range of motion. Neck supple.  Patient's neck examined in soft cervical collar.  There is obvious serosanguineous discharge around the border of the wound.  The cervical soft collar also has some serosanguineous drainage staining the top.  Cardiovascular: Normal rate, regular rhythm and normal heart sounds.  Pulmonary/Chest: Effort normal and breath sounds normal. No respiratory distress. He exhibits no tenderness.  Abdominal: Soft. He exhibits no distension. There is no tenderness.  Musculoskeletal: He exhibits no edema.  Neurological: He is alert and oriented to person, place, and time.  Bilateral equal grip strengths  Skin: Skin is warm and dry. He is not diaphoretic.  Psychiatric: His behavior is normal.  Nursing note and vitals reviewed.    ED Treatments / Results  Labs (all labs ordered are listed, but only abnormal results are displayed) Labs Reviewed - No data to display  EKG EKG Interpretation  Date/Time:  Wednesday May 28 2018 10:30:02 EDT Ventricular Rate:  93 PR Interval:    QRS Duration: 146 QT Interval:  408 QTC Calculation: 508 R Axis:   37 Text Interpretation:  Sinus rhythm Left bundle branch block Artifact Abnormal ekg Confirmed by Gerhard Munch [phone]) on 05/28/2018 10:32:54 AM   Radiology No results found.  Procedures Procedures (including critical care time)  Medications Ordered in ED Medications - No data to display   Initial Impression / Assessment and Plan / ED Course  I have reviewed the triage vital signs and the nursing notes.  Pertinent labs & imaging results that were available during my care of the patient were reviewed by me and considered in my medical decision making (see chart  for details).  Clinical Course as of Jun 01 2243  Wed May 28, 2018  1215 Patient's EKG shows left bundle branch block.  No previous EKG to compare.  Do not feel that we need to call a STEMI currently as his pain is positional and will obtain a troponin.   [AH]  1243 Patient states that currently he is completely pain free both in his chest neck   [AH]  1352 Patient reevaluated by Nurse . Rating his pain at 8/10 but only with movement.   [AH]  1614 Went to CT and had worsening of his chest pain after movement which he now describes as 10 out of 10.  Pain medicine administered.  Repeat EKG again shows left bundle branch block with no changes.  His i-STAT troponin is actually lower than it was earlier today I doubt this is ACS or  dissection.  The patient was just brought back from CT scan of the neck and began complaining again of 10 out of 10 pain in his chest out after the movement associated with getting the CT of his neck and changing positions.  He denies shortness of breath and I have very low suspicion for pulmonary embolus.   [AH]  1716 Patient CT chest negative for acute abnormality except for some esophagitis.  I reviewed the CT scans personally with Dr. Dutch Quint.  We have no significant findings.  Dr. Dutch Quint suggest that her pain control and Decadron.  He may also go home with a course of oral steroids.  I discussed findings with the patient and his sister who is at bedside states that "I cannot take him home like this."  She says that his care has been extremely difficult because he is in significant pain whenever he tries to get up and his can hardly get to the bathroom.  Patient may need skilled nursing facility placement.  I discussed the case with Lincoln Brigham.  The patient will be given oral pain medication.  A dose of IV Decadron.  Will reevaluate to see if things have improved and patient can be discharged successfully.  Otherwise patient may need SNF placement with case management.   [AH]      Clinical Course User Index [AH] Arthor Captain, PA-C      Final Clinical Impressions(s) / ED Diagnoses   Final diagnoses:  Precordial pain    ED Discharge Orders    None       Arthor Captain, PA-C 05/31/18 2244    Gerhard Munch, MD 06/03/18 1911

## 2018-05-28 NOTE — ED Provider Notes (Signed)
Patient care assumed [phone].  Pt here s/p cervical laminectomy with chest pain.    Pain severe in nature, limits movement despite multiple meds.  LBBB on EKG - no prior for comparison.  CT chest neg for PE or dissection but does demonstrate cardiomegaly as well as esophagitis.  Given ongoing pain, unable to control sxs Medicine consulted for admission.     Tilden Fossa, MD 05/29/18 Marlyne Beards

## 2018-05-28 NOTE — ED Triage Notes (Signed)
Pt Biba for c.o centralized non radiating CP that began last night ; pt states that the CP hurts more when he is sitting than when he is laying down ;  pt denies any SOB ; pt recently had spinal surgery last week ; pt received 324 of asa prior to arrival ;152/80 , hr 70 96%RA cbg 138

## 2018-05-28 NOTE — H&P (Signed)
History and Physical    Ian Ramos ZOX:[id number] DOB: [birth year] DOA: 05/28/2018  PCP: Mliss Sax, MD   Patient coming from: Home  Chief Complaint: Chest pain  HPI: Ian Ramos is a 72 y.o. male with medical history significant for chronic kidney disease stage III and cervical stenosis with myelopathy status post cervical laminectomy on 05/23/2018, now presenting to the emergency department for evaluation of chest pain.  Patient reports that he was experiencing pain in the central chest upon waking this morning, much worse when he tried to sit up, worse with any movement, and not associated with shortness of breath, diaphoresis, or nausea.  Pain is been constant and localized to the central chest.  He has not experienced these symptoms previously.  Denies swelling or tenderness in the lower extremities.  Denies fevers or chills.  ED Course: Upon arrival to the ED, patient is found to be afebrile, saturating well on room air, slightly bradycardic, and with stable blood pressure.  EKG features a sinus rhythm with LBBB and no prior available for comparison.  Chest x-ray is negative for acute cardiopulmonary disease.  CTA chest is concerning for circumferential esophageal wall thickening.  Cervical spine CT is notable for postoperative fluid collection.  Chemistry panel features a creatinine 1.39, similar to priors.  CBC is notable for an improved postoperative anemia with hemoglobin 10.3 today.  Troponin is negative x2 and decreasing.  ED physician discussed the CT findings with neurosurgery and there is no intervention required and the current pain is unlikely to be related to the surgery.  Patient was given Decadron, Percocet, morphine, fentanyl, Pepcid, and 1 L of normal saline in the ED.  He continues to complain of severe pain in the central chest at rest and will be observed for ongoing evaluation and management of this.  Review of Systems:  All other systems reviewed and  apart from HPI, are negative.  Past Medical History:  Diagnosis Date  . Arthritis   . Neuromuscular disorder [facility])     Past Surgical History:  Procedure Laterality Date  . POSTERIOR CERVICAL FUSION/FORAMINOTOMY N/A 05/23/2018   Procedure: Posterior Cervical Fusion with lateral mass fixation - C1 - C6 with laminectomy;  Surgeon: Julio Sicks, MD;  Location: [facility] OR;  Service: Neurosurgery;  Laterality: N/A;     reports that he has never smoked. His smokeless tobacco use includes chew. He reports that he does not drink alcohol or use drugs.  No Known Allergies  Family History  Problem Relation Age of Onset  . Hypertension Other      Prior to Admission medications   Medication Sig Start Date End Date Taking? Authorizing Provider  HYDROcodone-acetaminophen (NORCO) 10-325 MG tablet Take 1 tablet by mouth every 6 (six) hours as needed for moderate pain.   Yes [provider]  ibuprofen (ADVIL,MOTRIN) 200 MG tablet Take 400 mg by mouth every 6 (six) hours as needed for moderate pain.   Yes [provider]    Physical Exam: Vitals:   05/28/18 1830 05/28/18 1930 05/28/18 2000 05/28/18 2015  BP: (!) 117/53 114/63 111/77 (!) 116/58  Pulse: 74 (!) 56 (!) 58 60  Resp: 18 16 18 16   Temp:      TempSrc:      SpO2: 97% 94% 92% 100%  Weight:      Height:          Constitutional: NAD, appears uncomfortable Eyes: PERTLA, lids and conjunctivae normal ENMT: Mucous membranes are moist. Posterior pharynx  clear of any exudate or lesions.   Neck: non-tender, no masses  Respiratory: clear to auscultation bilaterally, no wheezing, no crackles. Normal respiratory effort.   Cardiovascular: S1 & S2 heard, regular rate and rhythm. No extremity edema.   Abdomen: No distension, no tenderness, soft. Bowel sounds normal.  Musculoskeletal: no clubbing / cyanosis. No joint deformity upper and lower extremities.    Skin: no significant rashes, lesions, ulcers. Warm, dry,  well-perfused. Neurologic: CN 2-12 grossly intact. Patellar DTR normal. Moving all extremities.  Psychiatric:  Alert and oriented x 3. Calm, cooperative.     Labs on Admission: I have personally reviewed following labs and imaging studies  CBC: Recent Labs  Lab 05/26/18 0808 05/28/18 1037  WBC 11.6* 10.5  NEUTROABS 9.7*  --   HGB 9.3* 10.3*  HCT 28.5* 31.4*  MCV 91.1 89.5  PLT 187 224   Basic Metabolic Panel: Recent Labs  Lab 05/26/18 0808 05/28/18 1037  NA 138 137  K 4.4 4.4  CL 103 101  CO2 26 27  GLUCOSE 123* 126*  BUN 31* 26*  CREATININE 1.47* 1.39*  CALCIUM 8.6* 8.8*   GFR: Estimated Creatinine Clearance: 47.8 mL/min (A) (by C-G formula based on SCr of 1.39 mg/dL (H)). Liver Function Tests: No results for input(s): AST, ALT, ALKPHOS, BILITOT, PROT, ALBUMIN in the last 168 hours. No results for input(s): LIPASE, AMYLASE in the last 168 hours. No results for input(s): AMMONIA in the last 168 hours. Coagulation Profile: No results for input(s): INR, PROTIME in the last 168 hours. Cardiac Enzymes: No results for input(s): CKTOTAL, CKMB, CKMBINDEX, TROPONINI in the last 168 hours. BNP (last 3 results) No results for input(s): PROBNP in the last 8760 hours. HbA1C: No results for input(s): HGBA1C in the last 72 hours. CBG: No results for input(s): GLUCAP in the last 168 hours. Lipid Profile: No results for input(s): CHOL, HDL, LDLCALC, TRIG, CHOLHDL, LDLDIRECT in the last 72 hours. Thyroid Function Tests: No results for input(s): TSH, T4TOTAL, FREET4, T3FREE, THYROIDAB in the last 72 hours. Anemia Panel: No results for input(s): VITAMINB12, FOLATE, FERRITIN, TIBC, IRON, RETICCTPCT in the last 72 hours. Urine analysis:    Component Value Date/Time   COLORURINE STRAW (A) 05/26/2018 0724   APPEARANCEUR CLEAR 05/26/2018 0724   LABSPEC 1.013 05/26/2018 0724   PHURINE 6.0 05/26/2018 0724   GLUCOSEU NEGATIVE 05/26/2018 0724   HGBUR NEGATIVE 05/26/2018 0724    BILIRUBINUR NEGATIVE 05/26/2018 0724   KETONESUR NEGATIVE 05/26/2018 0724   PROTEINUR NEGATIVE 05/26/2018 0724   UROBILINOGEN 0.2 06/25/2011 1027   NITRITE NEGATIVE 05/26/2018 0724   LEUKOCYTESUR NEGATIVE 05/26/2018 0724   Sepsis Labs: @LABRCNTIP (procalcitonin:4,lacticidven:4) ) Recent Results (from the past 240 hour(s))  Urine culture     Status: None   Collection Time: 05/26/18  9:25 AM  Result Value Ref Range Status   Specimen Description URINE, CLEAN CATCH  Final   Special Requests NONE  Final   Culture   Final    NO GROWTH Performed at [facility], [address], [place], [place] [address]    Report Status 05/27/2018 FINAL  Final     Radiological Exams on Admission: Dg Chest 2 View  Result Date: 05/28/2018 CLINICAL DATA:  Chest pain and weakness for a few days. The patient underwent cervical fusion 05/23/2018. EXAM: CHEST - 2 VIEW COMPARISON:  PA and lateral chest 05/26/2018. FINDINGS: Lung volumes are low with minimal atelectasis in the left base. No pneumothorax or pleural effusion. Heart size is normal.  No acute or focal bony abnormality. Degenerative disease about the shoulders noted. IMPRESSION: No acute disease. Electronically Signed   By: Drusilla Kanner M.D.   On: 05/28/2018 11:57   Ct Cervical Spine Wo Contrast  Result Date: 05/28/2018 CLINICAL DATA:  Chest pain.  Recent cervical spine surgery. EXAM: CT CERVICAL SPINE WITHOUT CONTRAST TECHNIQUE: Multidetector CT imaging of the cervical spine was performed without intravenous contrast. Multiplanar CT image reconstructions were also generated. COMPARISON:  MRI cervical spine January 01, 2018 FINDINGS: ALIGNMENT: Straightened cervical lordosis.  No malalignment. SKULL BASE AND VERTEBRAE: Vertebral bodies intact. Interval C1 through C6 laminectomies. Intact well-seated lateral mass screws. The RIGHT C3, C4, C5 and C6 screws are intra-articular. The LEFT C3 through C6 facet screws are intra-articular, the LEFT facet screw  extends into the foramen transversarium. The LEFT C6 screw extends into the cephalad aspect of the foramen transversarium. Intact hardware. Non incorporated posterior bone graft material. Stable moderate to severe disc height loss C3-4 through T1-2 with endplate sclerosis and marginal spurring compatible with degenerative discs. Stable geographic subcentimeter cyst and posterior odontoid process without cortical disruption. 1 cm calcified pannus posterior to the odontoid process seen with CPPD without destructive bony changes. SOFT TISSUES AND SPINAL CANAL: And gas within the surgical bed and ventral epidural space at C1-2 consistent with recent surgery. Small prevertebral effusion is likely postoperative. Mild calcific atherosclerosis aortic arch. DISC LEVELS: Canal obscured by hardware streak artifact. Neural foraminal narrowing all cervical levels varying from moderate to severe. UPPER CHEST: Lung apices are clear. OTHER: None. IMPRESSION: 1. Status post recent C1 through C6 laminectomies and posterior instrumentation. Multilevel intra-articular facet screws, LEFT C2 screw in foramen transversarium. 2. Postoperative fluid collection and gas consistent with recent surgery/seroma, pseudomeningocele not excluded. If there is clinical concern for infection, consider sampling. 3. No fracture deformity or malalignment. Aortic Atherosclerosis (ICD10-I70.0). Electronically Signed   By: Awilda Metro M.D.   On: 05/28/2018 16:32   Ct Angio Chest Aorta W And/or Wo Contrast  Result Date: 05/28/2018 CLINICAL DATA:  Centralized non radiating chest pain beginning last evening. Recent spinal surgery. Evaluate for pulmonary embolism. EXAM: CT ANGIOGRAPHY CHEST WITH CONTRAST TECHNIQUE: Multidetector CT imaging of the chest was performed using the standard protocol during bolus administration of intravenous contrast. Multiplanar CT image reconstructions and MIPs were obtained to evaluate the vascular anatomy. CONTRAST:   ISOVUE-370 IOPAMIDOL (ISOVUE-370) INJECTION 76% COMPARISON:  Chest radiograph - 05/28/2018 FINDINGS: Vascular Findings: There is adequate opacification of the pulmonary arterial system with the main pulmonary artery measuring 1,251 Hounsfield units. There are no discrete filling defects within the pulmonary arterial tree to suggest pulmonary embolism. Enlarged caliber of the main pulmonary artery measuring 35 mm in diameter. Cardiomegaly.  No pericardial effusion. Minimal amount of atherosclerotic plaque within a normal caliber thoracic aorta. Conventional configuration of the aortic arch. The branch vessels of the aortic arch appear patent throughout their imaged course. Review of the MIP images confirms the above findings. ---------------------------------------------------------------------------------- Nonvascular Findings: Mediastinum/Lymph Nodes: Potential mild circumferential wall thickening of the esophagus (representative image 46, series 6), potentially accentuated due to underdistention though conceivably esophagitis could have a similar appearance. No bulky mediastinal, hilar or axillary lymphadenopathy. Lungs/Pleura: Minimal dependent subpleural ground-glass atelectasis. No discrete focal airspace opacities. No pleural effusion or pneumothorax. The central pulmonary airways appear widely patent. No discrete pulmonary nodules. Upper abdomen: Limited early arterial phase evaluation of the upper abdomen demonstrates mild thickening of the left adrenal gland without discrete nodule. Musculoskeletal: Regional soft  tissues appear normal. There is a punctate (approximately 0.6 cm) hypoattenuating nodule within the inferior medial aspect the left lobe of the thyroid (image 23, series 6), of doubtful clinical concern. IMPRESSION: 1. Suspected circumferential esophageal wall thickening, potentially accentuated due to underdistention though potentially and esophagitis could have a similar appearance.  Clinical correlation is advised. 2. Otherwise, no explanation for patient's centralized non radiating chest pain. Specifically, no evidence of pulmonary embolism. 3. Cardiomegaly with enlargement of the caliber the main pulmonary artery, nonspecific though could be seen in the setting of pulmonary arterial hypertension. Further evaluation cardiac echo could be performed as indicated. 4. Aortic Atherosclerosis (ICD10-I70.0). Electronically Signed   By: Simonne Come M.D.   On: 05/28/2018 17:04    EKG: Independently reviewed. Sinus rhythm, LBBB, no priors available.   Assessment/Plan   1. Chest pain; LBBB  - Presents with one day of chest pain at rest, worse with movement, and is found to have LBBB with no prior EKG's available  - Troponin 0.04 -> 0.00  - CTA notable for esophageal thickening  - Treated with ASA 324 prior to arrival and given fentanyl, morphine, and Percocet in ED but continues to complain of pain  - GI-etiology favored based on atypical sxs and CTA findings  - Continue cardiac monitoring, check a third troponin, trial Pepcid and GI cocktail, continue supportive care   2. Cervical stenosis with myelopathy  - Status-post C1-C6 decompressive laminectomy  - CT c-spine with postoperative fluid collection, reviewed by NSG and no intervention needed and unlikely to be source of pain    3. CKD III  - SCr is 1.39 on admission, similar to priors  - Renally-dose medications    4. Normocytic anemia  - Hgb is 10.3 on admission, improved from 9.3 on 8/26  - No active bleeding  - Likely d/t recent surgery and improving    DVT prophylaxis: SCD's  Code Status: Full  Family Communication: Discussed with patient  Consults called: ED physician discussed with neurosurgery Admission status: Observation    Briscoe Deutscher, MD [facility] Pager [phone]  If 7PM-7AM, please contact night-coverage [email or web address] Password [facility]  05/28/2018, 8:32 PM

## 2018-05-29 ENCOUNTER — Ambulatory Visit (HOSPITAL_BASED_OUTPATIENT_CLINIC_OR_DEPARTMENT_OTHER): Payer: BLUE CROSS/BLUE SHIELD

## 2018-05-29 DIAGNOSIS — R9389 Abnormal findings on diagnostic imaging of other specified body structures: Secondary | ICD-10-CM | POA: Diagnosis not present

## 2018-05-29 DIAGNOSIS — R0789 Other chest pain: Secondary | ICD-10-CM

## 2018-05-29 DIAGNOSIS — K228 Other specified diseases of esophagus: Secondary | ICD-10-CM | POA: Diagnosis not present

## 2018-05-29 DIAGNOSIS — R52 Pain, unspecified: Secondary | ICD-10-CM | POA: Diagnosis not present

## 2018-05-29 DIAGNOSIS — K209 Esophagitis, unspecified: Secondary | ICD-10-CM

## 2018-05-29 DIAGNOSIS — I340 Nonrheumatic mitral (valve) insufficiency: Secondary | ICD-10-CM | POA: Diagnosis not present

## 2018-05-29 DIAGNOSIS — G959 Disease of spinal cord, unspecified: Secondary | ICD-10-CM | POA: Diagnosis not present

## 2018-05-29 DIAGNOSIS — R072 Precordial pain: Secondary | ICD-10-CM | POA: Diagnosis not present

## 2018-05-29 DIAGNOSIS — N183 Chronic kidney disease, stage 3 (moderate): Secondary | ICD-10-CM | POA: Diagnosis not present

## 2018-05-29 LAB — BASIC METABOLIC PANEL
Anion gap: 8 (ref 5–15)
BUN: 27 mg/dL — AB (ref 8–23)
CALCIUM: 8.2 mg/dL — AB (ref 8.9–10.3)
CHLORIDE: 104 mmol/L (ref 98–111)
CO2: 24 mmol/L (ref 22–32)
CREATININE: 1.42 mg/dL — AB (ref 0.61–1.24)
GFR calc non Af Amer: 47 mL/min — ABNORMAL LOW (ref >60)
GFR, EST AFRICAN AMERICAN: 55 mL/min — AB (ref >60)
GLUCOSE: 133 mg/dL — AB (ref 70–99)
Potassium: 4.7 mmol/L (ref 3.5–5.1)
Sodium: 136 mmol/L (ref 135–145)

## 2018-05-29 LAB — ECHOCARDIOGRAM COMPLETE
Height: 67.5 in
Weight: 2684.32 oz

## 2018-05-29 LAB — TROPONIN I

## 2018-05-29 MED ORDER — PANTOPRAZOLE SODIUM 40 MG PO TBEC
40.0000 mg | DELAYED_RELEASE_TABLET | Freq: Two times a day (BID) | ORAL | Status: DC
  Administered 2018-05-29 – 2018-06-09 (×23): 40 mg via ORAL
  Filled 2018-05-29 (×24): qty 1

## 2018-05-29 MED ORDER — PERFLUTREN LIPID MICROSPHERE
1.0000 mL | INTRAVENOUS | Status: AC | PRN
  Administered 2018-05-29: 2 mL via INTRAVENOUS
  Filled 2018-05-29: qty 10

## 2018-05-29 NOTE — Plan of Care (Signed)
  Problem: Education: Goal: Knowledge of General Education information will improve Description Including pain rating scale, medication(s)/side effects and non-pharmacologic comfort measures Outcome: Progressing   Problem: Health Behavior/Discharge Planning: Goal: Ability to manage health-related needs will improve Outcome: Progressing   Problem: Clinical Measurements: Goal: Will remain free from infection Outcome: Progressing Goal: Respiratory complications will improve Outcome: Progressing Goal: Cardiovascular complication will be avoided Outcome: Progressing   Problem: Nutrition: Goal: Adequate nutrition will be maintained Outcome: Progressing   

## 2018-05-29 NOTE — Progress Notes (Signed)
PROGRESS NOTE    Ian Ramos  NFA:[id number] DOB: 09/12/1946 DOA: 05/28/2018 PCP: Mliss Sax, MD   Brief Narrative:  Ian Ramos is a 72 y.o. male with medical history significant for chronic kidney disease stage III and cervical stenosis with myelopathy status post cervical laminectomy on 05/23/2018, now presenting to the emergency department for evaluation of chest pain.  the chest pain is substernal, associated with change in the position, worse when sitting up and better laying down. He also reports left shoulder pain and unable to lift the left shoulder from pain.    Assessment & Plan:   Principal Problem:   Intractable pain Active Problems:   Cervical myelopathy (HCC)   Chest pain   CKD (chronic kidney disease), stage III (HCC)   Esophageal thickening   LBBB (left bundle branch block)   Postoperative anemia   Atypical chest pain:  Unclear etiology. Differential include ACS, vs esophageal spasm vs musculoskeletal pain vs pleuritis.   ACS ruled out with negative troponins.  EKG shows LBBB, unclear if this is new or old. No ischemic changes. Overnight telemetry does not show any ischemic changes.  Echocardiogram is ordered and pending.  CTA shows circumferential thickening of the esophagus.  Pt reports using up to 6 tab of ibuprofen before the cervical laminectomy.  GI consulted for recommendations.   Cervical myelopathy:  S/p laminectomy on 8/23 . Has a neck collar.   ED Discussed with Dr Jordan Likes, recommended the chest pain not related to the surgery.    Stage 3 CKD: Outpatient follow up with PCP.   Mild normocytic anemia: Hemoglobin stable around 10.  Continue to monitor.    DVT prophylaxis:scd's Code Status: full code.  Family Communication: sister at bedside.  Disposition Plan: pending clinical improvement.   Consultants:   None.   Procedures:CTA of the chest.  Antimicrobials: none.   Subjective: Pt reports no pain while laying  still and hurts only when standing or sitting up.    Objective: Vitals:   05/28/18 2056 05/28/18 2359 05/29/18 0425 05/29/18 0732  BP: 138/69 138/70 (!) 150/77 133/77  Pulse: (!) 58 (!) 59 65 (!) 53  Resp: 14 16 20 18   Temp: 98.5 F (36.9 C) 99.1 F (37.3 C) 97.9 F (36.6 C) 98.3 F (36.8 C)  TempSrc: Oral Oral Oral Oral  SpO2: 96% 99% 99% 98%  Weight: 76.1 kg     Height: 5' 7.5" (1.715 m)       Intake/Output Summary (Last 24 hours) at 05/29/2018 1057 Last data filed at 05/29/2018 0800 Gross per 24 hour  Intake 1333.27 ml  Output 285 ml  Net 1048.27 ml   Filed Weights   05/28/18 1035 05/28/18 2056  Weight: 79.4 kg 76.1 kg    Examination:  General exam: Appears calm and comfortable  Respiratory system: Clear to auscultation. Respiratory effort normal. Cardiovascular system: S1 & S2 heard, RRR. No JVD, Gastrointestinal system: Abdomen is nondistended, soft and nontender. No organomegaly or masses felt. Normal bowel sounds heard. Central nervous system: Alert and oriented. No focal neurological deficits. Extremities: no pedal edema, reports unable to lift left arm beyond above the shoulder secondary to pain.  Skin: No rashes, lesions or ulcers Psychiatry:Mood & affect appropriate.     Data Reviewed: I have personally reviewed following labs and imaging studies  CBC: Recent Labs  Lab 05/26/18 0808 05/28/18 1037  WBC 11.6* 10.5  NEUTROABS 9.7*  --   HGB 9.3* 10.3*  HCT 28.5* 31.4*  MCV  91.1 89.5  PLT 187 224   Basic Metabolic Panel: Recent Labs  Lab 05/26/18 0808 05/28/18 1037  NA 138 137  K 4.4 4.4  CL 103 101  CO2 26 27  GLUCOSE 123* 126*  BUN 31* 26*  CREATININE 1.47* 1.39*  CALCIUM 8.6* 8.8*   GFR: Estimated Creatinine Clearance: 45.1 mL/min (A) (by C-G formula based on SCr of 1.39 mg/dL (H)). Liver Function Tests: No results for input(s): AST, ALT, ALKPHOS, BILITOT, PROT, ALBUMIN in the last 168 hours. No results for input(s): LIPASE, AMYLASE  in the last 168 hours. No results for input(s): AMMONIA in the last 168 hours. Coagulation Profile: No results for input(s): INR, PROTIME in the last 168 hours. Cardiac Enzymes: Recent Labs  Lab 05/28/18 2207  TROPONINI <0.03   BNP (last 3 results) No results for input(s): PROBNP in the last 8760 hours. HbA1C: No results for input(s): HGBA1C in the last 72 hours. CBG: No results for input(s): GLUCAP in the last 168 hours. Lipid Profile: No results for input(s): CHOL, HDL, LDLCALC, TRIG, CHOLHDL, LDLDIRECT in the last 72 hours. Thyroid Function Tests: No results for input(s): TSH, T4TOTAL, FREET4, T3FREE, THYROIDAB in the last 72 hours. Anemia Panel: No results for input(s): VITAMINB12, FOLATE, FERRITIN, TIBC, IRON, RETICCTPCT in the last 72 hours. Sepsis Labs: No results for input(s): PROCALCITON, LATICACIDVEN in the last 168 hours.  Recent Results (from the past 240 hour(s))  Urine culture     Status: None   Collection Time: 05/26/18  9:25 AM  Result Value Ref Range Status   Specimen Description URINE, CLEAN CATCH  Final   Special Requests NONE  Final   Culture   Final    NO GROWTH Performed at [facility], [address] [place], [place] [address]    Report Status 05/27/2018 FINAL  Final         Radiology Studies: Dg Chest 2 View  Result Date: 05/28/2018 CLINICAL DATA:  Chest pain and weakness for a few days. The patient underwent cervical fusion 05/23/2018. EXAM: CHEST - 2 VIEW COMPARISON:  PA and lateral chest 05/26/2018. FINDINGS: Lung volumes are low with minimal atelectasis in the left base. No pneumothorax or pleural effusion. Heart size is normal. No acute or focal bony abnormality. Degenerative disease about the shoulders noted. IMPRESSION: No acute disease. Electronically Signed   By: Drusilla Kanner M.D.   On: 05/28/2018 11:57   Ct Cervical Spine Wo Contrast  Result Date: 05/28/2018 CLINICAL DATA:  Chest pain.  Recent cervical spine surgery. EXAM:  CT CERVICAL SPINE WITHOUT CONTRAST TECHNIQUE: Multidetector CT imaging of the cervical spine was performed without intravenous contrast. Multiplanar CT image reconstructions were also generated. COMPARISON:  MRI cervical spine January 01, 2018 FINDINGS: ALIGNMENT: Straightened cervical lordosis.  No malalignment. SKULL BASE AND VERTEBRAE: Vertebral bodies intact. Interval C1 through C6 laminectomies. Intact well-seated lateral mass screws. The RIGHT C3, C4, C5 and C6 screws are intra-articular. The LEFT C3 through C6 facet screws are intra-articular, the LEFT facet screw extends into the foramen transversarium. The LEFT C6 screw extends into the cephalad aspect of the foramen transversarium. Intact hardware. Non incorporated posterior bone graft material. Stable moderate to severe disc height loss C3-4 through T1-2 with endplate sclerosis and marginal spurring compatible with degenerative discs. Stable geographic subcentimeter cyst and posterior odontoid process without cortical disruption. 1 cm calcified pannus posterior to the odontoid process seen with CPPD without destructive bony changes. SOFT TISSUES AND SPINAL CANAL: And gas within the  surgical bed and ventral epidural space at C1-2 consistent with recent surgery. Small prevertebral effusion is likely postoperative. Mild calcific atherosclerosis aortic arch. DISC LEVELS: Canal obscured by hardware streak artifact. Neural foraminal narrowing all cervical levels varying from moderate to severe. UPPER CHEST: Lung apices are clear. OTHER: None. IMPRESSION: 1. Status post recent C1 through C6 laminectomies and posterior instrumentation. Multilevel intra-articular facet screws, LEFT C2 screw in foramen transversarium. 2. Postoperative fluid collection and gas consistent with recent surgery/seroma, pseudomeningocele not excluded. If there is clinical concern for infection, consider sampling. 3. No fracture deformity or malalignment. Aortic Atherosclerosis  (ICD10-I70.0). Electronically Signed   By: Awilda Metro M.D.   On: 05/28/2018 16:32   Ct Angio Chest Aorta W And/or Wo Contrast  Result Date: 05/28/2018 CLINICAL DATA:  Centralized non radiating chest pain beginning last evening. Recent spinal surgery. Evaluate for pulmonary embolism. EXAM: CT ANGIOGRAPHY CHEST WITH CONTRAST TECHNIQUE: Multidetector CT imaging of the chest was performed using the standard protocol during bolus administration of intravenous contrast. Multiplanar CT image reconstructions and MIPs were obtained to evaluate the vascular anatomy. CONTRAST:  ISOVUE-370 IOPAMIDOL (ISOVUE-370) INJECTION 76% COMPARISON:  Chest radiograph - 05/28/2018 FINDINGS: Vascular Findings: There is adequate opacification of the pulmonary arterial system with the main pulmonary artery measuring 1,251 Hounsfield units. There are no discrete filling defects within the pulmonary arterial tree to suggest pulmonary embolism. Enlarged caliber of the main pulmonary artery measuring 35 mm in diameter. Cardiomegaly.  No pericardial effusion. Minimal amount of atherosclerotic plaque within a normal caliber thoracic aorta. Conventional configuration of the aortic arch. The branch vessels of the aortic arch appear patent throughout their imaged course. Review of the MIP images confirms the above findings. ---------------------------------------------------------------------------------- Nonvascular Findings: Mediastinum/Lymph Nodes: Potential mild circumferential wall thickening of the esophagus (representative image 46, series 6), potentially accentuated due to underdistention though conceivably esophagitis could have a similar appearance. No bulky mediastinal, hilar or axillary lymphadenopathy. Lungs/Pleura: Minimal dependent subpleural ground-glass atelectasis. No discrete focal airspace opacities. No pleural effusion or pneumothorax. The central pulmonary airways appear widely patent. No discrete pulmonary  nodules. Upper abdomen: Limited early arterial phase evaluation of the upper abdomen demonstrates mild thickening of the left adrenal gland without discrete nodule. Musculoskeletal: Regional soft tissues appear normal. There is a punctate (approximately 0.6 cm) hypoattenuating nodule within the inferior medial aspect the left lobe of the thyroid (image 23, series 6), of doubtful clinical concern. IMPRESSION: 1. Suspected circumferential esophageal wall thickening, potentially accentuated due to underdistention though potentially and esophagitis could have a similar appearance. Clinical correlation is advised. 2. Otherwise, no explanation for patient's centralized non radiating chest pain. Specifically, no evidence of pulmonary embolism. 3. Cardiomegaly with enlargement of the caliber the main pulmonary artery, nonspecific though could be seen in the setting of pulmonary arterial hypertension. Further evaluation cardiac echo could be performed as indicated. 4. Aortic Atherosclerosis (ICD10-I70.0). Electronically Signed   By: Simonne Come M.D.   On: 05/28/2018 17:04        Scheduled Meds: . aspirin EC  81 mg Oral Daily   Continuous Infusions: . famotidine (PEPCID) IV 20 mg (05/29/18 0622)     LOS: 0 days    Time spent: 35 min    Kathlen Mody, MD [facility] Pager [phone]   If 7PM-7AM, please contact night-coverage [email or web address] Password [email or web address] 05/29/2018, 10:57 AM

## 2018-05-29 NOTE — Progress Notes (Signed)
Echocardiogram 2D Echocardiogram has been performed.  05/29/2018 3:44 PM Gertie Fey, MHA, RVT, RDCS, RDMS

## 2018-05-29 NOTE — Progress Notes (Signed)
Pt stats 0/10 pain in chest when lying flat in bed. RN attempted to sit pt up to eat breakfast and chest pain started when pt sitting up at 20 degrees with 7/10 sharp chest pain. VSS. When pt laid flat again pain subsided. Will continue to monitor.  Versie Starks, RN

## 2018-05-29 NOTE — Consult Note (Addendum)
[facility] Consult: 11:43 AM 05/29/2018  LOS: 0 days    Referring Provider: Dr Blake Divine  Primary Care Physician:  Mliss Sax, MD Primary Gastroenterologist:  unassigned    Reason for Consultation:  Esophagitis??, NCCP   HPI: Ian Ramos is a 72 y.o. male.  Hx stage 3 CKD.   No previous EGD or colonoscopy.    Severe C spine stenosis.  On 8/23 pt underwent 6 level cervical spine fusion, laminectomy.  Was loosing function in left arm prior to surgery.  Stopped taking Ibuprofen 1.2 gm daily, on 8/16 as advised by Dr Dutch Quint.   On Monday, started having mid sternal CP, progressed on Tuesday and brought to ED.  Pain triggered by trying to sit up and engaging muscles of chest. Ruled out for ACS, cardiology not consulted. CT angio chest concerning for circumferential esophageal wall thickening.  Cardiomegaly.  Aortic atherosclerosis.  Punctate left thyroid nodule.     Cervical spine CT is notable for postoperative fluid collection.   Pt denies any difficulty swallowing.  Does not have GERD symptoms.  No weight loss.  Chronically constipated.  At best he moves his bowels every other day but sometimes can go more than 7 days without a bowel movement.  Never sees blood, no dark/melenic stools. Hgb 13.3 >> 9.3 >> 10.3. Was 14 to 15 in 2012.  MCV 89.  WBCs and platelets normal.  Family history negative for colon cancer, colitis, anemia, ulcer disease, GERD.     Past Medical History:  Diagnosis Date  . Arthritis   . Neuromuscular disorder [facility])     Past Surgical History:  Procedure Laterality Date  . POSTERIOR CERVICAL FUSION/FORAMINOTOMY N/A 05/23/2018   Procedure: Posterior Cervical Fusion with lateral mass fixation - C1 - C6 with laminectomy;  Surgeon: Julio Sicks, MD;  Location: [facility] OR;  Service:  Neurosurgery;  Laterality: N/A;    Prior to Admission medications   Medication Sig Start Date End Date Taking? Authorizing Provider  HYDROcodone-acetaminophen (NORCO) 10-325 MG tablet Take 1 tablet by mouth every 6 (six) hours as needed for moderate pain.   Yes [provider]  ibuprofen (ADVIL,MOTRIN) 200 MG tablet Take 400 mg by mouth every 6 (six) hours as needed for moderate pain.   Yes [provider]    Scheduled Meds: . aspirin EC  81 mg Oral Daily   Infusions: . famotidine (PEPCID) IV 20 mg (05/29/18 0622)   PRN Meds: acetaminophen, ALPRAZolam, bisacodyl, gi cocktail, morphine injection, nitroGLYCERIN, ondansetron (ZOFRAN) IV, oxyCODONE-acetaminophen, senna, zolpidem   Allergies as of 05/28/2018  . (No Known Allergies)    Family History  Problem Relation Age of Onset  . Hypertension Other     Social History   Socioeconomic History  . Marital status: Divorced    Spouse name: Not on file  . Number of children: Not on file  . Years of education: Not on file  . Highest education level: Not on file  Occupational History  . Not on file  Social Needs  . Financial resource strain: Not on  file  . Food insecurity:    Worry: Not on file    Inability: Not on file  . Transportation needs:    Medical: Not on file    Non-medical: Not on file  Tobacco Use  . Smoking status: Never Smoker  . Smokeless tobacco: Current User    Types: Chew  Substance and Sexual Activity  . Alcohol use: No    Frequency: Never  . Drug use: No  . Sexual activity: Not on file  Lifestyle  . Physical activity:    Days per week: Not on file    Minutes per session: Not on file  . Stress: Not on file  Relationships  . Social connections:    Talks on phone: Not on file    Gets together: Not on file    Attends religious service: Not on file    Active member of club or organization: Not on file    Attends meetings of clubs or organizations: Not on file    Relationship  status: Not on file  . Intimate partner violence:    Fear of current or ex partner: Not on file    Emotionally abused: Not on file    Physically abused: Not on file    Forced sexual activity: Not on file  Other Topics Concern  . Not on file  Social History Narrative  . Not on file    REVIEW OF SYSTEMS: Constitutional: Patient generally has good energy.  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 ENT:  No nose bleeds Pulm: Denies shortness of breath or cough.  Breathing does not trigger the pain CV:  No palpitations, no LE edema.  GU:  No hematuria, no frequency GI:  Per HPI Heme: No unusual bleeding or bruising.  No prior issues with anemia or needing to take iron supplements. Transfusions: No prior transfusions. Neuro:  No headaches or seizures.  He was having spasticity and loss of function in his left arm.  Function of the left arm has improved just a small amount since the surgery last week. Derm:  No itching, no rash or sores.  Endocrine:  No sweats or chills.  No polyuria or dysuria Immunization:  Reviewed.   Travel:  None beyond local counties in last few months.    PHYSICAL EXAM: Vital signs in last 24 hours: Vitals:   05/29/18 0732 05/29/18 1133  BP: 133/77 134/72  Pulse: (!) 53   Resp: 18 (!) 21  Temp: 98.3 F (36.8 C) 97.7 F (36.5 C)  SpO2: 98% 100%   Wt Readings from Last 3 Encounters:  05/28/18 76.1 kg  05/23/18 79.4 kg  05/16/18 79.4 kg    General: Patient appears his stated age.  Comfortable at rest but when he tries to move his torso it triggers the chest pain Head: No facial asymmetry or swelling.  No signs of head trauma peer Eyes: No scleral icterus.  No conjunctival pallor.  EOMI. Ears: Not hard of hearing. Nose: Discharge Mouth: Moist, clear oral mucosa.  Tongue midline. Neck: No JVD, no masses, no thyromegaly.  Soft cervical collar in place, not removed. Lungs: Clear bilaterally.  No  labored breathing or cough. Heart: RRR.  No MRG.  S1, S2 present. Abdomen: Soft.  Not tender or distended.  Active bowel sounds.  No HSM, masses, bruits, hernias..   Rectal: Deferred Musc/Skeltl: No joint swelling, redness or gross deformity other than some minor  arthritic changes in the hands/fingers Extremities: No CCE. Neurologic: Limited movement in his left arm.  Grip long in both hands.  No tremor.  Oriented to place, time, situation. Skin: Tanned, no suspicious lesions.  No sores or rashes. Nodes: No cervical adenopathy Psych: Minimal agitation at times, primarily during discussion with his sister and wife.  Intake/Output from previous day: 08/28 0701 - 08/29 0700 In: 1333.3 [I.V.:283.3; IV Piggyback:1050] Out: 160 [Urine:160] Intake/Output this shift: Total I/O In: -  Out: 125 [Urine:125]  LAB RESULTS: Recent Labs    05/28/18 1037  WBC 10.5  HGB 10.3*  HCT 31.4*  PLT 224   BMET Lab Results  Component Value Date   NA 137 05/28/2018   NA 138 05/26/2018   NA 139 05/16/2018   K 4.4 05/28/2018   K 4.4 05/26/2018   K 3.8 05/16/2018   CL 101 05/28/2018   CL 103 05/26/2018   CL 108 05/16/2018   CO2 27 05/28/2018   CO2 26 05/26/2018   CO2 22 05/16/2018   GLUCOSE 126 (H) 05/28/2018   GLUCOSE 123 (H) 05/26/2018   GLUCOSE 110 (H) 05/16/2018   BUN 26 (H) 05/28/2018   BUN 31 (H) 05/26/2018   BUN 22 05/16/2018   CREATININE 1.39 (H) 05/28/2018   CREATININE 1.47 (H) 05/26/2018   CREATININE 1.65 (H) 05/16/2018   CALCIUM 8.8 (L) 05/28/2018   CALCIUM 8.6 (L) 05/26/2018   CALCIUM 9.1 05/16/2018   LFT No results for input(s): PROT, ALBUMIN, AST, ALT, ALKPHOS, BILITOT, BILIDIR, IBILI in the last 72 hours. PT/INR No results found for: INR, PROTIME Hepatitis Panel No results for input(s): HEPBSAG, HCVAB, HEPAIGM, HEPBIGM in the last 72 hours. C-Diff No components found for: CDIFF Lipase  No results found for: LIPASE  Drugs of Abuse  No results found for: LABOPIA,  COCAINSCRNUR, LABBENZ, AMPHETMU, THCU, LABBARB   RADIOLOGY STUDIES: Dg Chest 2 View  Result Date: 05/28/2018 CLINICAL DATA:  Chest pain and weakness for a few days. The patient underwent cervical fusion 05/23/2018. EXAM: CHEST - 2 VIEW COMPARISON:  PA and lateral chest 05/26/2018. FINDINGS: Lung volumes are low with minimal atelectasis in the left base. No pneumothorax or pleural effusion. Heart size is normal. No acute or focal bony abnormality. Degenerative disease about the shoulders noted. IMPRESSION: No acute disease. Electronically Signed   By: Drusilla Kanner M.D.   On: 05/28/2018 11:57   Ct Cervical Spine Wo Contrast  Result Date: 05/28/2018 CLINICAL DATA:  Chest pain.  Recent cervical spine surgery. EXAM: CT CERVICAL SPINE WITHOUT CONTRAST TECHNIQUE: Multidetector CT imaging of the cervical spine was performed without intravenous contrast. Multiplanar CT image reconstructions were also generated. COMPARISON:  MRI cervical spine January 01, 2018 FINDINGS: ALIGNMENT: Straightened cervical lordosis.  No malalignment. SKULL BASE AND VERTEBRAE: Vertebral bodies intact. Interval C1 through C6 laminectomies. Intact well-seated lateral mass screws. The RIGHT C3, C4, C5 and C6 screws are intra-articular. The LEFT C3 through C6 facet screws are intra-articular, the LEFT facet screw extends into the foramen transversarium. The LEFT C6 screw extends into the cephalad aspect of the foramen transversarium. Intact hardware. Non incorporated posterior bone graft material. Stable moderate to severe disc height loss C3-4 through T1-2 with endplate sclerosis and marginal spurring compatible with degenerative discs. Stable geographic subcentimeter cyst and posterior odontoid process without cortical disruption. 1 cm calcified pannus posterior to the odontoid process seen with CPPD without destructive bony changes. SOFT TISSUES AND SPINAL CANAL: And gas within the surgical bed and ventral epidural  space at C1-2  consistent with recent surgery. Small prevertebral effusion is likely postoperative. Mild calcific atherosclerosis aortic arch. DISC LEVELS: Canal obscured by hardware streak artifact. Neural foraminal narrowing all cervical levels varying from moderate to severe. UPPER CHEST: Lung apices are clear. OTHER: None. IMPRESSION: 1. Status post recent C1 through C6 laminectomies and posterior instrumentation. Multilevel intra-articular facet screws, LEFT C2 screw in foramen transversarium. 2. Postoperative fluid collection and gas consistent with recent surgery/seroma, pseudomeningocele not excluded. If there is clinical concern for infection, consider sampling. 3. No fracture deformity or malalignment. Aortic Atherosclerosis (ICD10-I70.0). Electronically Signed   By: Awilda Metro M.D.   On: 05/28/2018 16:32   Ct Angio Chest Aorta W And/or Wo Contrast  Result Date: 05/28/2018 CLINICAL DATA:  Centralized non radiating chest pain beginning last evening. Recent spinal surgery. Evaluate for pulmonary embolism. EXAM: CT ANGIOGRAPHY CHEST WITH CONTRAST TECHNIQUE: Multidetector CT imaging of the chest was performed using the standard protocol during bolus administration of intravenous contrast. Multiplanar CT image reconstructions and MIPs were obtained to evaluate the vascular anatomy. CONTRAST:  ISOVUE-370 IOPAMIDOL (ISOVUE-370) INJECTION 76% COMPARISON:  Chest radiograph - 05/28/2018 FINDINGS: Vascular Findings: There is adequate opacification of the pulmonary arterial system with the main pulmonary artery measuring 1,251 Hounsfield units. There are no discrete filling defects within the pulmonary arterial tree to suggest pulmonary embolism. Enlarged caliber of the main pulmonary artery measuring 35 mm in diameter. Cardiomegaly.  No pericardial effusion. Minimal amount of atherosclerotic plaque within a normal caliber thoracic aorta. Conventional configuration of the aortic arch. The branch vessels of the  aortic arch appear patent throughout their imaged course. Review of the MIP images confirms the above findings. ---------------------------------------------------------------------------------- Nonvascular Findings: Mediastinum/Lymph Nodes: Potential mild circumferential wall thickening of the esophagus (representative image 46, series 6), potentially accentuated due to underdistention though conceivably esophagitis could have a similar appearance. No bulky mediastinal, hilar or axillary lymphadenopathy. Lungs/Pleura: Minimal dependent subpleural ground-glass atelectasis. No discrete focal airspace opacities. No pleural effusion or pneumothorax. The central pulmonary airways appear widely patent. No discrete pulmonary nodules. Upper abdomen: Limited early arterial phase evaluation of the upper abdomen demonstrates mild thickening of the left adrenal gland without discrete nodule. Musculoskeletal: Regional soft tissues appear normal. There is a punctate (approximately 0.6 cm) hypoattenuating nodule within the inferior medial aspect the left lobe of the thyroid (image 23, series 6), of doubtful clinical concern. IMPRESSION: 1. Suspected circumferential esophageal wall thickening, potentially accentuated due to underdistention though potentially and esophagitis could have a similar appearance. Clinical correlation is advised. 2. Otherwise, no explanation for patient's centralized non radiating chest pain. Specifically, no evidence of pulmonary embolism. 3. Cardiomegaly with enlargement of the caliber the main pulmonary artery, nonspecific though could be seen in the setting of pulmonary arterial hypertension. Further evaluation cardiac echo could be performed as indicated. 4. Aortic Atherosclerosis (ICD10-I70.0). Electronically Signed   By: Simonne Come M.D.   On: 05/28/2018 17:04     IMPRESSION:   *    Noncardiac chest pain. Although the CT of the chest suggests esophageal thickening and therefore esophagitis,  the pattern of pain is not consistent with esophagitis.  The pain is triggered by movement but he is not tender to palpation of his chest wall.  No odyno/dysphagia or reflux symptoms.  *    Recent multilevel screw fixation, laminectomy of cervical spine.  I suspect his pain has something to do with the surgery.  *     Normocytic anemia. His stage 3 CKD  may be contributing to his anemia.  Do not have a sense of the Hgb trend over the last couple of years, the only past Hgb is from 7 years ago.   PLAN:     *   Stopped IV Pepcid, started Protonix 40 mg p.o. twice daily. Given that he just had neck surgery, not sure he is a good candidate for EGD unless absolutely necessary, i.e. upper GI hemorrhage. We can pursue the EGD in several weeks once he has convalesced from his neck surgery  *     Obtain anemia/iron labs in the morning.  Barium esophagram to further assess esophagus.   Since he has had lunch today we will get this study performed in the morning.  N.p.o. after midnight to allow for this study.  Jennye Moccasin  05/29/2018, 11:43 AM Phone [phone]  GI ATTENDING  History, laboratories, x-rays reviewed. Patient personally seen and examined. Sr. In room. Agree with copper hence of consultation note as outlined above. We are asked to see him for chest pain and abnormal CT scan suggesting esophageal thickening, possibly esophagitis. The patient's pain is positional, not esophageal. He has no esophageal symptoms such as heartburn or dysphagia. Had no problems preoperatively. We will perform a barium esophagram to evaluate the esophageal mucosa and rule out marked abnormalities. If unrevealing, no further assessment or treatment from GI standpoint and primary service should continue to assess for non-GI causes for his positional chest pain. Thank you  Wilhemina Bonito. Eda Keys., M.D. [facility]

## 2018-05-30 ENCOUNTER — Observation Stay (HOSPITAL_COMMUNITY): Payer: BLUE CROSS/BLUE SHIELD

## 2018-05-30 DIAGNOSIS — R52 Pain, unspecified: Secondary | ICD-10-CM | POA: Diagnosis not present

## 2018-05-30 DIAGNOSIS — R9389 Abnormal findings on diagnostic imaging of other specified body structures: Secondary | ICD-10-CM | POA: Diagnosis not present

## 2018-05-30 DIAGNOSIS — R0789 Other chest pain: Secondary | ICD-10-CM | POA: Diagnosis not present

## 2018-05-30 DIAGNOSIS — K228 Other specified diseases of esophagus: Secondary | ICD-10-CM | POA: Diagnosis not present

## 2018-05-30 LAB — RETICULOCYTES
RBC.: 3.33 MIL/uL — ABNORMAL LOW (ref 4.22–5.81)
RETIC COUNT ABSOLUTE: 103.2 10*3/uL (ref 19.0–186.0)
Retic Ct Pct: 3.1 % (ref 0.4–3.1)

## 2018-05-30 LAB — CBC
HCT: 29.9 % — ABNORMAL LOW (ref 39.0–52.0)
Hemoglobin: 9.7 g/dL — ABNORMAL LOW (ref 13.0–17.0)
MCH: 29.1 pg (ref 26.0–34.0)
MCHC: 32.4 g/dL (ref 30.0–36.0)
MCV: 89.8 fL (ref 78.0–100.0)
Platelets: 231 10*3/uL (ref 150–400)
RBC: 3.33 MIL/uL — AB (ref 4.22–5.81)
RDW: 11.9 % (ref 11.5–15.5)
WBC: 7.9 10*3/uL (ref 4.0–10.5)

## 2018-05-30 LAB — IRON AND TIBC
IRON: 29 ug/dL — AB (ref 45–182)
Saturation Ratios: 11 % — ABNORMAL LOW (ref 17.9–39.5)
TIBC: 273 ug/dL (ref 250–450)
UIBC: 244 ug/dL

## 2018-05-30 LAB — FOLATE: FOLATE: 8.9 ng/mL (ref >5.9)

## 2018-05-30 LAB — VITAMIN B12: VITAMIN B 12: 203 pg/mL (ref 180–914)

## 2018-05-30 LAB — FERRITIN: Ferritin: 121 ng/mL (ref 24–336)

## 2018-05-30 MED ORDER — METHYLPREDNISOLONE 4 MG PO TBPK
8.0000 mg | ORAL_TABLET | Freq: Every evening | ORAL | Status: AC
  Administered 2018-05-31: 8 mg via ORAL

## 2018-05-30 MED ORDER — METHYLPREDNISOLONE 4 MG PO TBPK
8.0000 mg | ORAL_TABLET | Freq: Every morning | ORAL | Status: AC
  Administered 2018-05-30: 8 mg via ORAL
  Filled 2018-05-30: qty 21

## 2018-05-30 MED ORDER — METHYLPREDNISOLONE 4 MG PO TBPK
4.0000 mg | ORAL_TABLET | ORAL | Status: AC
  Administered 2018-05-30: 4 mg via ORAL

## 2018-05-30 MED ORDER — METHYLPREDNISOLONE 4 MG PO TBPK
4.0000 mg | ORAL_TABLET | Freq: Four times a day (QID) | ORAL | Status: AC
  Administered 2018-06-01 – 2018-06-04 (×10): 4 mg via ORAL

## 2018-05-30 MED ORDER — METHYLPREDNISOLONE 4 MG PO TBPK
4.0000 mg | ORAL_TABLET | Freq: Three times a day (TID) | ORAL | Status: AC
  Administered 2018-05-31 (×3): 4 mg via ORAL

## 2018-05-30 MED ORDER — METHYLPREDNISOLONE 4 MG PO TBPK
8.0000 mg | ORAL_TABLET | Freq: Every evening | ORAL | Status: AC
  Administered 2018-05-30: 8 mg via ORAL

## 2018-05-30 MED ORDER — GABAPENTIN 300 MG PO CAPS
300.0000 mg | ORAL_CAPSULE | Freq: Three times a day (TID) | ORAL | Status: DC
  Administered 2018-05-30 – 2018-06-09 (×31): 300 mg via ORAL
  Filled 2018-05-30 (×31): qty 1

## 2018-05-30 NOTE — Progress Notes (Addendum)
Daily Rounding Note  05/30/2018, 10:50 AM  LOS: 0 days   SUBJECTIVE:   Chief complaint:     Still has the movement related SSCP  OBJECTIVE:         Vital signs in last 24 hours:    Temp:  [97.7 F (36.5 C)-98.7 F (37.1 C)] 97.7 F (36.5 C) (08/30 0734) Pulse Rate:  [54-61] 54 (08/30 0734) Resp:  [13-21] 19 (08/30 0734) BP: (115-134)/(59-72) 127/63 (08/30 0734) SpO2:  [96 %-100 %] 100 % (08/30 0734) Last BM Date: 05/27/18 Filed Weights   05/28/18 1035 05/28/18 2056  Weight: 79.4 kg 76.1 kg   General: looks ok   Did not reexamine pt.   No labored breathing.    Intake/Output from previous day: 08/29 0701 - 08/30 0700 In: 680 [P.O.:680] Out: 450 [Urine:450]  Intake/Output this shift: No intake/output data recorded.  Lab Results: Recent Labs    05/28/18 1037 05/30/18 0319  WBC 10.5 7.9  HGB 10.3* 9.7*  HCT 31.4* 29.9*  PLT 224 231   BMET Recent Labs    05/28/18 1037 05/29/18 1101  NA 137 136  K 4.4 4.7  CL 101 104  CO2 27 24  GLUCOSE 126* 133*  BUN 26* 27*  CREATININE 1.39* 1.42*  CALCIUM 8.8* 8.2*   LFT No results for input(s): PROT, ALBUMIN, AST, ALT, ALKPHOS, BILITOT, BILIDIR, IBILI in the last 72 hours. PT/INR No results for input(s): LABPROT, INR in the last 72 hours. Hepatitis Panel No results for input(s): HEPBSAG, HCVAB, HEPAIGM, HEPBIGM in the last 72 hours.  Studies/Results: Dg Chest 2 View  Result Date: 05/28/2018 CLINICAL DATA:  Chest pain and weakness for a few days. The patient underwent cervical fusion 05/23/2018. EXAM: CHEST - 2 VIEW COMPARISON:  PA and lateral chest 05/26/2018. FINDINGS: Lung volumes are low with minimal atelectasis in the left base. No pneumothorax or pleural effusion. Heart size is normal. No acute or focal bony abnormality. Degenerative disease about the shoulders noted. IMPRESSION: No acute disease. Electronically Signed   By: Drusilla Kanner M.D.    On: 05/28/2018 11:57   Ct Cervical Spine Wo Contrast  Result Date: 05/28/2018 CLINICAL DATA:  Chest pain.  Recent cervical spine surgery. EXAM: CT CERVICAL SPINE WITHOUT CONTRAST TECHNIQUE: Multidetector CT imaging of the cervical spine was performed without intravenous contrast. Multiplanar CT image reconstructions were also generated. COMPARISON:  MRI cervical spine January 01, 2018 FINDINGS: ALIGNMENT: Straightened cervical lordosis.  No malalignment. SKULL BASE AND VERTEBRAE: Vertebral bodies intact. Interval C1 through C6 laminectomies. Intact well-seated lateral mass screws. The RIGHT C3, C4, C5 and C6 screws are intra-articular. The LEFT C3 through C6 facet screws are intra-articular, the LEFT facet screw extends into the foramen transversarium. The LEFT C6 screw extends into the cephalad aspect of the foramen transversarium. Intact hardware. Non incorporated posterior bone graft material. Stable moderate to severe disc height loss C3-4 through T1-2 with endplate sclerosis and marginal spurring compatible with degenerative discs. Stable geographic subcentimeter cyst and posterior odontoid process without cortical disruption. 1 cm calcified pannus posterior to the odontoid process seen with CPPD without destructive bony changes. SOFT TISSUES AND SPINAL CANAL: And gas within the surgical bed and ventral epidural space at C1-2 consistent with recent surgery. Small prevertebral effusion is likely postoperative. Mild calcific atherosclerosis aortic arch. DISC LEVELS: Canal obscured by hardware streak artifact. Neural foraminal narrowing all cervical levels varying from moderate to severe. UPPER CHEST: Lung apices are clear.  OTHER: None. IMPRESSION: 1. Status post recent C1 through C6 laminectomies and posterior instrumentation. Multilevel intra-articular facet screws, LEFT C2 screw in foramen transversarium. 2. Postoperative fluid collection and gas consistent with recent surgery/seroma, pseudomeningocele not  excluded. If there is clinical concern for infection, consider sampling. 3. No fracture deformity or malalignment. Aortic Atherosclerosis (ICD10-I70.0). Electronically Signed   By: Awilda Metro M.D.   On: 05/28/2018 16:32   Dg Esophagus  Result Date: 05/30/2018 CLINICAL DATA:  Esophageal wall thickening on CT EXAM: ESOPHOGRAM/BARIUM SWALLOW TECHNIQUE: Single contrast examination was performed using  thin barium. FLUOROSCOPY TIME:  Fluoroscopy Time:  42 seconds Radiation Exposure Index (if provided by the fluoroscopic device): 12.5 mGy Number of Acquired Spot Images: 8 COMPARISON:  CTA chest dated 05/28/2018 FINDINGS: Limited evaluation due to difficulty with patient positioning and inability to perform double-contrast evaluation. Single contrast evaluation in the recumbent supine position was performed. No mucosal irregularity to suggest esophageal mass. No fixed esophageal narrowing or stricture. Moderate esophageal dysmotility. No evidence of hiatal hernia. Visualized stomach is grossly unremarkable. IMPRESSION: Moderate esophageal dysmotility. Otherwise negative single-contrast esophagram. Electronically Signed   By: Charline Bills M.D.   On: 05/30/2018 09:30   Ct Angio Chest Aorta W And/or Wo Contrast  Result Date: 05/28/2018 CLINICAL DATA:  Centralized non radiating chest pain beginning last evening. Recent spinal surgery. Evaluate for pulmonary embolism. EXAM: CT ANGIOGRAPHY CHEST WITH CONTRAST TECHNIQUE: Multidetector CT imaging of the chest was performed using the standard protocol during bolus administration of intravenous contrast. Multiplanar CT image reconstructions and MIPs were obtained to evaluate the vascular anatomy. CONTRAST:  ISOVUE-370 IOPAMIDOL (ISOVUE-370) INJECTION 76% COMPARISON:  Chest radiograph - 05/28/2018 FINDINGS: Vascular Findings: There is adequate opacification of the pulmonary arterial system with the main pulmonary artery measuring 1,251 Hounsfield units.  There are no discrete filling defects within the pulmonary arterial tree to suggest pulmonary embolism. Enlarged caliber of the main pulmonary artery measuring 35 mm in diameter. Cardiomegaly.  No pericardial effusion. Minimal amount of atherosclerotic plaque within a normal caliber thoracic aorta. Conventional configuration of the aortic arch. The branch vessels of the aortic arch appear patent throughout their imaged course. Review of the MIP images confirms the above findings. ---------------------------------------------------------------------------------- Nonvascular Findings: Mediastinum/Lymph Nodes: Potential mild circumferential wall thickening of the esophagus (representative image 46, series 6), potentially accentuated due to underdistention though conceivably esophagitis could have a similar appearance. No bulky mediastinal, hilar or axillary lymphadenopathy. Lungs/Pleura: Minimal dependent subpleural ground-glass atelectasis. No discrete focal airspace opacities. No pleural effusion or pneumothorax. The central pulmonary airways appear widely patent. No discrete pulmonary nodules. Upper abdomen: Limited early arterial phase evaluation of the upper abdomen demonstrates mild thickening of the left adrenal gland without discrete nodule. Musculoskeletal: Regional soft tissues appear normal. There is a punctate (approximately 0.6 cm) hypoattenuating nodule within the inferior medial aspect the left lobe of the thyroid (image 23, series 6), of doubtful clinical concern. IMPRESSION: 1. Suspected circumferential esophageal wall thickening, potentially accentuated due to underdistention though potentially and esophagitis could have a similar appearance. Clinical correlation is advised. 2. Otherwise, no explanation for patient's centralized non radiating chest pain. Specifically, no evidence of pulmonary embolism. 3. Cardiomegaly with enlargement of the caliber the main pulmonary artery, nonspecific though could  be seen in the setting of pulmonary arterial hypertension. Further evaluation cardiac echo could be performed as indicated. 4. Aortic Atherosclerosis (ICD10-I70.0). Electronically Signed   By: Simonne Come M.D.   On: 05/28/2018 17:04   Scheduled Meds: . aspirin  EC  81 mg Oral Daily  . gabapentin  300 mg Oral TID  . methylPREDNISolone  4 mg Oral PC lunch  . methylPREDNISolone  4 mg Oral PC supper  . [START ON 05/31/2018] methylPREDNISolone  4 mg Oral 3 x daily with food  . [START ON 06/01/2018] methylPREDNISolone  4 mg Oral 4X daily taper  . methylPREDNISolone  8 mg Oral AC breakfast  . methylPREDNISolone  8 mg Oral Nightly  . [START ON 05/31/2018] methylPREDNISolone  8 mg Oral Nightly  . pantoprazole  40 mg Oral BID   Continuous Infusions: PRN Meds:.acetaminophen, ALPRAZolam, bisacodyl, gi cocktail, morphine injection, nitroGLYCERIN, ondansetron (ZOFRAN) IV, oxyCODONE-acetaminophen, senna, zolpidem   ASSESMENT:   *  Asymptomatic esophagitis.  Incidental finding on CT of chest.    Esophageal dysmotility on esophagram.    *  Non-cardiac chest pain in pt s/p multilevel c spine fusion, laminectomy 8/23.   Pain is not c/w esophagitis.   Has been started on Prednisone taper for suspected C 5 radicular irritation per Dr Jordan Likes.    PLAN   *  Will sign off.   *  Not convinced he needs PPI at all, will reduce BID PPI to Q day.  Dr Marina Goodell will advise if PPI needs to continue and for how long (4 weeks).   H2 blocker might be acceptable alternative.    *  Pt should consider screeningcolonoscopy in future, he has never had one.  Alternatively Cologuard stool DNA testing might be appropriate.  Wife aware that Dr Marina Goodell and his staff are available to discuss and make arrangements in the future.    Jennye Moccasin  05/30/2018, 10:50 AM Phone [phone]  GI ATTENDING  Interval history data reviewed. Patient seen and examined. Esophagram negative. Chest pain not felt to be GI. In terms of PPI, would give  him one empirically for 4 weeks as he will be prone to reflux with decreased mobilization. As for comments above regarding colon cancer screening, the patient should discuss this with his PCP. Will sign off.  Wilhemina Bonito. Eda Keys., M.D. [facility] Division of Gastroenterology

## 2018-05-30 NOTE — Progress Notes (Signed)
Called neurosurgery for ordered consult. [phone].

## 2018-05-30 NOTE — Evaluation (Signed)
Physical Therapy Evaluation Patient Details Name: Ian Ramos MRN: [id number] DOB: 1946-07-10 Today's Date: 05/30/2018   History of Present Illness  Pt is a 72 y/o male admitted secondary to intractable chest and L shoulder pain of unclear etiology. Pt with recent cervical fusion. Imaging negative for PE and showed probable esophagitis. Imaging of C spine showed post op fluid collection. PMH includes gout and recent Cervical fusion.   Clinical Impression  Pt admitted secondary to problem above with deficits below. Pt extremely limited by pain this session. Performed sidelying to sit X3 with mod A. Pt unable to tolerate long periods of sitting despite max encouragement. Stated "lady, I can't do this, I have to go back down." Educated about importance of controlled descent and maintaining precautions despite his pain. Educated about importance of mobility. Will continue to follow acutely to maximize functional mobility independence and safety.     Follow Up Recommendations Other (comment);Supervision for mobility/OOB(HHPT vs SNF pending pt progress )    Equipment Recommendations  None recommended by PT    Recommendations for Other Services OT consult     Precautions / Restrictions Precautions Precautions: Fall;Cervical Precaution Booklet Issued: No Precaution Comments: Reviewed cervical precautions as pt unable to remember them.  Required Braces or Orthoses: Cervical Brace Cervical Brace: At all times;Soft collar Restrictions Weight Bearing Restrictions: No      Mobility  Bed Mobility Overal bed mobility: Needs Assistance Bed Mobility: Rolling;Sidelying to Sit;Sit to Sidelying Rolling: Mod assist Sidelying to sit: Mod assist     Sit to sidelying: Mod assist General bed mobility comments: Performed sidelying to sit X3. Each time pt reporting unbearable pain and attempting to throw himself back into supine. Required mod A for controlled descent and multiple verbal cues to  follow precautions throughout. Pt stating multiple times "lady I can't do this, I've got to go back down.   Transfers                 General transfer comment: Pt refusing secondary to increased pain.   Ambulation/Gait                Stairs            Wheelchair Mobility    Modified Rankin (Stroke Patients Only)       Balance Overall balance assessment: Needs assistance Sitting-balance support: No upper extremity supported;Feet supported Sitting balance-Leahy Scale: Poor Sitting balance - Comments: Reliant on min to mod A for sitting balance as pt very resistant to sitting at EOB secondary to pain.                                      Pertinent Vitals/Pain Pain Assessment: Faces Faces Pain Scale: Hurts whole lot Pain Location: L shoulder  Pain Descriptors / Indicators: Moaning;Sharp Pain Intervention(s): Limited activity within patient's tolerance;Monitored during session;Repositioned    Home Living Family/patient expects to be discharged to:: Private residence Living Arrangements: Spouse/significant other Available Help at Discharge: Family;Available PRN/intermittently Type of Home: House Home Access: Stairs to enter Entrance Stairs-Rails: None Entrance Stairs-Number of Steps: 2 Home Layout: One level Home Equipment: Walker - 2 wheels      Prior Function Level of Independence: Independent with assistive device(s)         Comments: Has been using RW since d/c from cervical surgery.      Hand Dominance   Dominant Hand: Right  Extremity/Trunk Assessment   Upper Extremity Assessment Upper Extremity Assessment: LUE deficits/detail LUE Deficits / Details: Very guarded. Sharp pain in back of shoulder which limited ROM.     Lower Extremity Assessment Lower Extremity Assessment: Generalized weakness    Cervical / Trunk Assessment Cervical / Trunk Assessment: Other exceptions Cervical / Trunk Exceptions: recent cervical  surgery   Communication   Communication: No difficulties  Cognition Arousal/Alertness: Awake/alert Behavior During Therapy: Flat affect Overall Cognitive Status: Impaired/Different from baseline Area of Impairment: Problem solving;Safety/judgement                         Safety/Judgement: Decreased awareness of safety;Decreased awareness of deficits   Problem Solving: Slow processing;Requires verbal cues General Comments: Pt very unaware of safety and deficits and required multiple cues not to throw himself into supine to avoid jolting his neck.       General Comments General comments (skin integrity, edema, etc.): Educated about importance of mobility and need to sit upright.     Exercises     Assessment/Plan    PT Assessment Patient needs continued PT services  PT Problem List Decreased strength;Decreased range of motion;Decreased activity tolerance;Decreased balance;Decreased mobility;Decreased cognition;Decreased knowledge of use of DME;Decreased knowledge of precautions;Pain       PT Treatment Interventions DME instruction;Gait training;Therapeutic activities;Stair training;Functional mobility training;Therapeutic exercise;Balance training;Patient/family education    PT Goals (Current goals can be found in the Care Plan section)  Acute Rehab PT Goals Patient Stated Goal: to decrease pain  PT Goal Formulation: With patient Time For Goal Achievement: 06/13/18 Potential to Achieve Goals: Good    Frequency Min 3X/week   Barriers to discharge        Co-evaluation               AM-PAC PT "6 Clicks" Daily Activity  Outcome Measure Difficulty turning over in bed (including adjusting bedclothes, sheets and blankets)?: Unable Difficulty moving from lying on back to sitting on the side of the bed? : Unable Difficulty sitting down on and standing up from a chair with arms (e.g., wheelchair, bedside commode, etc,.)?: Unable Help needed moving to and from a  bed to chair (including a wheelchair)?: A Lot Help needed walking in hospital room?: Total Help needed climbing 3-5 steps with a railing? : Total 6 Click Score: 7    End of Session Equipment Utilized During Treatment: Cervical collar Activity Tolerance: Patient limited by pain Patient left: in bed;with call bell/phone within reach;with bed alarm set;with family/visitor present Nurse Communication: Mobility status PT Visit Diagnosis: Pain;Unsteadiness on feet (R26.81);Muscle weakness (generalized) (M62.81);Difficulty in walking, not elsewhere classified (R26.2) Pain - Right/Left: Left Pain - part of body: Shoulder    Time: 0973-5329 PT Time Calculation (min) (ACUTE ONLY): 19 min   Charges:   PT Evaluation $PT Eval Moderate Complexity: 1 Mod          Gladys Damme, PT, DPT  Acute Rehabilitation Services  Pager: [phone]   Le[facility] Prom 05/30/2018, 3:07 PM

## 2018-05-30 NOTE — Progress Notes (Signed)
PROGRESS NOTE    Ian Ramos  ZOX:[id number] DOB: [birth year] DOA: 05/28/2018 PCP: Mliss Sax, MD   Brief Narrative:  Ian Ramos is a 72 y.o. male with medical history significant for chronic kidney disease stage III and cervical stenosis with myelopathy status post cervical laminectomy on 05/23/2018, now presenting to the emergency department for evaluation of chest pain.  the chest pain is substernal, associated with change in the position, worse when sitting up and better laying down. He also reports left shoulder pain and unable to lift the left shoulder from pain.    Assessment & Plan:   Principal Problem:   Intractable pain Active Problems:   Cervical myelopathy (HCC)   Chest pain   CKD (chronic kidney disease), stage III (HCC)   Esophageal thickening   LBBB (left bundle branch block)   Postoperative anemia   Atypical chest pain:  Unclear etiology. Reports its completely resolved, now the pain is int he left shoulder joint and worse when moving his arm and the shoulder.  Differential include ACS, vs esophageal spasm vs musculoskeletal pain vs pleuritis.   ACS ruled out with negative troponins.  EKG shows LBBB, unclear if this is new or old. No ischemic changes. Overnight telemetry does not show any ischemic changes.  Echocardiogram is ordered , showed LVEF of 35 to 40%, with diffuse hypokinesis, with abnormal left ventricular relaxation, grade 1 diastolic dysfunction. No pericardial effusion. CTA shows circumferential thickening of the esophagus.  Pt reports using up to 6 tab of ibuprofen before the cervical laminectomy.  GI consulted for recommendations, underwent barium swallow today, showed moderate dysmotility.  ppi was added and recommended outpatient follow up with GI for screening colonoscopy.    Cervical myelopathy:  S/p laminectomy on 8/23 . Has a neck collar.   ED Discussed with Dr Jordan Likes, recommended the chest pain not related to the  surgery.  neurontin and brief course of steroids ordered by neurosurgery.    Stage 3 CKD: Outpatient follow up with PCP.   Mild normocytic anemia: Hemoglobin stable around 10.  Continue to monitor.    DVT prophylaxis:scd's Code Status: full code.  Family Communication: none  at bedside.  Disposition Plan: pending clinical improvement and PT evaluation.  Plan for SNF when bed available.   Consultants:   None.   Procedures:CTA of the chest.  Antimicrobials: none.   Subjective: No chest pain or sob. Left shoulder joint pain.    Objective: Vitals:   05/29/18 2003 05/30/18 0035 05/30/18 0310 05/30/18 0734  BP: 127/68 (!) 115/59 123/60 127/63  Pulse: 61 (!) 55 60 (!) 54  Resp: 19 18 13 19   Temp:  97.9 F (36.6 C) 97.7 F (36.5 C) 97.7 F (36.5 C)  TempSrc:  Oral Oral Oral  SpO2: 100% 98% 96% 100%  Weight:      Height:        Intake/Output Summary (Last 24 hours) at 05/30/2018 0956 Last data filed at 05/30/2018 0900 Gross per 24 hour  Intake 680 ml  Output 325 ml  Net 355 ml   Filed Weights   05/28/18 1035 05/28/18 2056  Weight: 79.4 kg 76.1 kg    Examination:  General exam: Appears calm and comfortable , not in distress.  Respiratory system: Clear to auscultation. Respiratory effort normal. No wheezing or rhonchi.  Cardiovascular system: S1 & S2 heard, RRR. No JVD , Gastrointestinal system: Abdomen is soft non tender non distended bowel sounds good.  Central nervous system: Alert and oriented.  Non focal. Extremities: no pedal edema, reports unable to lift left arm beyond above the shoulder secondary to pain.  Skin: No rashes, lesions or ulcers Psychiatry:Mood & affect appropriate.     Data Reviewed: I have personally reviewed following labs and imaging studies  CBC: Recent Labs  Lab 05/26/18 0808 05/28/18 1037 05/30/18 0319  WBC 11.6* 10.5 7.9  NEUTROABS 9.7*  --   --   HGB 9.3* 10.3* 9.7*  HCT 28.5* 31.4* 29.9*  MCV 91.1 89.5 89.8  PLT 187  224 231   Basic Metabolic Panel: Recent Labs  Lab 05/26/18 0808 05/28/18 1037 05/29/18 1101  NA 138 137 136  K 4.4 4.4 4.7  CL 103 101 104  CO2 26 27 24   GLUCOSE 123* 126* 133*  BUN 31* 26* 27*  CREATININE 1.47* 1.39* 1.42*  CALCIUM 8.6* 8.8* 8.2*   GFR: Estimated Creatinine Clearance: 44.1 mL/min (A) (by C-G formula based on SCr of 1.42 mg/dL (H)). Liver Function Tests: No results for input(s): AST, ALT, ALKPHOS, BILITOT, PROT, ALBUMIN in the last 168 hours. No results for input(s): LIPASE, AMYLASE in the last 168 hours. No results for input(s): AMMONIA in the last 168 hours. Coagulation Profile: No results for input(s): INR, PROTIME in the last 168 hours. Cardiac Enzymes: Recent Labs  Lab 05/28/18 2207 05/29/18 1548  TROPONINI <0.03 <0.03   BNP (last 3 results) No results for input(s): PROBNP in the last 8760 hours. HbA1C: No results for input(s): HGBA1C in the last 72 hours. CBG: No results for input(s): GLUCAP in the last 168 hours. Lipid Profile: No results for input(s): CHOL, HDL, LDLCALC, TRIG, CHOLHDL, LDLDIRECT in the last 72 hours. Thyroid Function Tests: No results for input(s): TSH, T4TOTAL, FREET4, T3FREE, THYROIDAB in the last 72 hours. Anemia Panel: Recent Labs    05/30/18 0319  VITAMINB12 203  FOLATE 8.9  FERRITIN 121  TIBC 273  IRON 29*  RETICCTPCT 3.1   Sepsis Labs: No results for input(s): PROCALCITON, LATICACIDVEN in the last 168 hours.  Recent Results (from the past 240 hour(s))  Urine culture     Status: None   Collection Time: 05/26/18  9:25 AM  Result Value Ref Range Status   Specimen Description URINE, CLEAN CATCH  Final   Special Requests NONE  Final   Culture   Final    NO GROWTH Performed at [facility], [address], [place], [place] [address]    Report Status 05/27/2018 FINAL  Final         Radiology Studies: Dg Chest 2 View  Result Date: 05/28/2018 CLINICAL DATA:  Chest pain and weakness for a few  days. The patient underwent cervical fusion 05/23/2018. EXAM: CHEST - 2 VIEW COMPARISON:  PA and lateral chest 05/26/2018. FINDINGS: Lung volumes are low with minimal atelectasis in the left base. No pneumothorax or pleural effusion. Heart size is normal. No acute or focal bony abnormality. Degenerative disease about the shoulders noted. IMPRESSION: No acute disease. Electronically Signed   By: Drusilla Kanner M.D.   On: 05/28/2018 11:57   Ct Cervical Spine Wo Contrast  Result Date: 05/28/2018 CLINICAL DATA:  Chest pain.  Recent cervical spine surgery. EXAM: CT CERVICAL SPINE WITHOUT CONTRAST TECHNIQUE: Multidetector CT imaging of the cervical spine was performed without intravenous contrast. Multiplanar CT image reconstructions were also generated. COMPARISON:  MRI cervical spine January 01, 2018 FINDINGS: ALIGNMENT: Straightened cervical lordosis.  No malalignment. SKULL BASE AND VERTEBRAE: Vertebral bodies intact. Interval C1 through C6 laminectomies.  Intact well-seated lateral mass screws. The RIGHT C3, C4, C5 and C6 screws are intra-articular. The LEFT C3 through C6 facet screws are intra-articular, the LEFT facet screw extends into the foramen transversarium. The LEFT C6 screw extends into the cephalad aspect of the foramen transversarium. Intact hardware. Non incorporated posterior bone graft material. Stable moderate to severe disc height loss C3-4 through T1-2 with endplate sclerosis and marginal spurring compatible with degenerative discs. Stable geographic subcentimeter cyst and posterior odontoid process without cortical disruption. 1 cm calcified pannus posterior to the odontoid process seen with CPPD without destructive bony changes. SOFT TISSUES AND SPINAL CANAL: And gas within the surgical bed and ventral epidural space at C1-2 consistent with recent surgery. Small prevertebral effusion is likely postoperative. Mild calcific atherosclerosis aortic arch. DISC LEVELS: Canal obscured by hardware  streak artifact. Neural foraminal narrowing all cervical levels varying from moderate to severe. UPPER CHEST: Lung apices are clear. OTHER: None. IMPRESSION: 1. Status post recent C1 through C6 laminectomies and posterior instrumentation. Multilevel intra-articular facet screws, LEFT C2 screw in foramen transversarium. 2. Postoperative fluid collection and gas consistent with recent surgery/seroma, pseudomeningocele not excluded. If there is clinical concern for infection, consider sampling. 3. No fracture deformity or malalignment. Aortic Atherosclerosis (ICD10-I70.0). Electronically Signed   By: Awilda Metro M.D.   On: 05/28/2018 16:32   Dg Esophagus  Result Date: 05/30/2018 CLINICAL DATA:  Esophageal wall thickening on CT EXAM: ESOPHOGRAM/BARIUM SWALLOW TECHNIQUE: Single contrast examination was performed using  thin barium. FLUOROSCOPY TIME:  Fluoroscopy Time:  42 seconds Radiation Exposure Index (if provided by the fluoroscopic device): 12.5 mGy Number of Acquired Spot Images: 8 COMPARISON:  CTA chest dated 05/28/2018 FINDINGS: Limited evaluation due to difficulty with patient positioning and inability to perform double-contrast evaluation. Single contrast evaluation in the recumbent supine position was performed. No mucosal irregularity to suggest esophageal mass. No fixed esophageal narrowing or stricture. Moderate esophageal dysmotility. No evidence of hiatal hernia. Visualized stomach is grossly unremarkable. IMPRESSION: Moderate esophageal dysmotility. Otherwise negative single-contrast esophagram. Electronically Signed   By: Charline Bills M.D.   On: 05/30/2018 09:30   Ct Angio Chest Aorta W And/or Wo Contrast  Result Date: 05/28/2018 CLINICAL DATA:  Centralized non radiating chest pain beginning last evening. Recent spinal surgery. Evaluate for pulmonary embolism. EXAM: CT ANGIOGRAPHY CHEST WITH CONTRAST TECHNIQUE: Multidetector CT imaging of the chest was performed using the standard  protocol during bolus administration of intravenous contrast. Multiplanar CT image reconstructions and MIPs were obtained to evaluate the vascular anatomy. CONTRAST:  ISOVUE-370 IOPAMIDOL (ISOVUE-370) INJECTION 76% COMPARISON:  Chest radiograph - 05/28/2018 FINDINGS: Vascular Findings: There is adequate opacification of the pulmonary arterial system with the main pulmonary artery measuring 1,251 Hounsfield units. There are no discrete filling defects within the pulmonary arterial tree to suggest pulmonary embolism. Enlarged caliber of the main pulmonary artery measuring 35 mm in diameter. Cardiomegaly.  No pericardial effusion. Minimal amount of atherosclerotic plaque within a normal caliber thoracic aorta. Conventional configuration of the aortic arch. The branch vessels of the aortic arch appear patent throughout their imaged course. Review of the MIP images confirms the above findings. ---------------------------------------------------------------------------------- Nonvascular Findings: Mediastinum/Lymph Nodes: Potential mild circumferential wall thickening of the esophagus (representative image 46, series 6), potentially accentuated due to underdistention though conceivably esophagitis could have a similar appearance. No bulky mediastinal, hilar or axillary lymphadenopathy. Lungs/Pleura: Minimal dependent subpleural ground-glass atelectasis. No discrete focal airspace opacities. No pleural effusion or pneumothorax. The central pulmonary airways appear widely patent. No  discrete pulmonary nodules. Upper abdomen: Limited early arterial phase evaluation of the upper abdomen demonstrates mild thickening of the left adrenal gland without discrete nodule. Musculoskeletal: Regional soft tissues appear normal. There is a punctate (approximately 0.6 cm) hypoattenuating nodule within the inferior medial aspect the left lobe of the thyroid (image 23, series 6), of doubtful clinical concern. IMPRESSION: 1. Suspected  circumferential esophageal wall thickening, potentially accentuated due to underdistention though potentially and esophagitis could have a similar appearance. Clinical correlation is advised. 2. Otherwise, no explanation for patient's centralized non radiating chest pain. Specifically, no evidence of pulmonary embolism. 3. Cardiomegaly with enlargement of the caliber the main pulmonary artery, nonspecific though could be seen in the setting of pulmonary arterial hypertension. Further evaluation cardiac echo could be performed as indicated. 4. Aortic Atherosclerosis (ICD10-I70.0). Electronically Signed   By: Simonne Come M.D.   On: 05/28/2018 17:04        Scheduled Meds: . aspirin EC  81 mg Oral Daily  . pantoprazole  40 mg Oral BID   Continuous Infusions:    LOS: 0 days    Time spent: 25 min    Kathlen Mody, MD [facility] Pager [phone]   If 7PM-7AM, please contact night-coverage [email or web address] Password [facility] 05/30/2018, 9:56 AM

## 2018-05-30 NOTE — Progress Notes (Signed)
Patient admitted to the hospital for atypical chest pain.  Patient is status post posterior cervical decompression and fusion approximately 1 week ago.  Postop with the patient is doing reasonably well.  He said good improvement of his symptoms of myelopathy and his profound weakness.  The patient reports pain with sitting upright.  The pain has moved from his chest wall and now he feels it more into his left proximal shoulder.  His left shoulder strength is much improved.  His upper extremity strength and sensation are both much improved distally as well.  His wound is healing well.  I reviewed the patient's CT scan which demonstrates good position of his instrumentation and good appearance of his decompression without obvious complication.  I believe this patient is suffering some symptoms of radicular irritation of his left-sided C5 nerve root.  I think he would benefit from a brief course of oral steroids and Neurontin.  He may be mobilized without restriction.  Patient is fine for discharge home from my standpoint once his mobility issues are squared away.

## 2018-05-31 DIAGNOSIS — R52 Pain, unspecified: Secondary | ICD-10-CM

## 2018-05-31 DIAGNOSIS — M5412 Radiculopathy, cervical region: Secondary | ICD-10-CM | POA: Diagnosis not present

## 2018-05-31 DIAGNOSIS — G959 Disease of spinal cord, unspecified: Secondary | ICD-10-CM | POA: Diagnosis not present

## 2018-05-31 DIAGNOSIS — R9389 Abnormal findings on diagnostic imaging of other specified body structures: Secondary | ICD-10-CM | POA: Diagnosis not present

## 2018-05-31 DIAGNOSIS — K228 Other specified diseases of esophagus: Secondary | ICD-10-CM | POA: Diagnosis not present

## 2018-05-31 NOTE — Evaluation (Signed)
Occupational Therapy Evaluation Patient Details Name: Ian Ramos MRN: [id number] DOB: 1945/11/25 Today's Date: 05/31/2018    History of Present Illness Pt is a 72 y/o male admitted secondary to intractable chest and L shoulder pain of unclear etiology. Pt with recent cervical fusion. Imaging negative for PE and showed probable esophagitis. Imaging of C spine showed post op fluid collection. PMH includes gout and recent Cervical fusion.    Clinical Impression   Pt with recent cervical sx and was ambulating with RW; sister was assisting with ADL. Prior to sx Pt was independent in ADL and mobility but has been struggling with fine motor tasks (unable to button) for approx  Year. Pt is currently limited by pain but motivated and very willing to work with therapy. LUE is most affected and has limited FF, grasp strength and presents with decreased sensation and generalized weakness. Pt is currently mod to max A for bed level ADL. Min A to sit EOB this session for approx 2 min this session. (completed recent PT session that included short ambulation). OT will follow acutely and recommending CIR level therapy to maximize safety and independence in ADL and mobility and return Pt to independent PLOF.     Follow Up Recommendations  CIR;Supervision/Assistance - 24 hour    Equipment Recommendations  Other (comment);3 in 1 bedside commode(defer to next venue)    Recommendations for Other Services       Precautions / Restrictions Precautions Precautions: Fall;Cervical Precaution Booklet Issued: No Precaution Comments: Reviewed cervical precautions with Pt and sister Required Braces or Orthoses: Cervical Brace Cervical Brace: Soft collar;At all times Restrictions Weight Bearing Restrictions: No      Mobility Bed Mobility Overal bed mobility: Needs Assistance Bed Mobility: Rolling;Sidelying to Sit;Sit to Sidelying Rolling: Min guard Sidelying to sit: Min assist(trunk elevation)     Sit  to sidelying: Min assist(BLE back into bed) General bed mobility comments: Pt able to sit EOB for approx 1 min for urination  Transfers Overall transfer level: Needs assistance Equipment used: Rolling walker (2 wheeled) Transfers: Sit to/from Stand Sit to Stand: Min assist;+2 safety/equipment         General transfer comment: not attempted without 2nd person to assist    Balance Overall balance assessment: Needs assistance Sitting-balance support: Feet supported;No upper extremity supported Sitting balance-Leahy Scale: Fair Sitting balance - Comments: better able to balance sitting EOB today   Standing balance support: Bilateral upper extremity supported;During functional activity Standing balance-Leahy Scale: Poor Standing balance comment: reliant on RW for support                           ADL either performed or assessed with clinical judgement   ADL Overall ADL's : Needs assistance/impaired Eating/Feeding: Moderate assistance;Bed level Eating/Feeding Details (indicate cue type and reason): sister assisting in the room Grooming: Set up;Bed level   Upper Body Bathing: Moderate assistance;Bed level   Lower Body Bathing: Maximal assistance;Bed level   Upper Body Dressing : Maximal assistance   Lower Body Dressing: Total assistance;Bed level   Toilet Transfer: (unable at this time) Toilet Transfer Details (indicate cue type and reason): min A to sit EOB, unable to urinate in urinal due to pain         Functional mobility during ADLs: (unable this session, unsafe without 2 assist) General ADL Comments: Pt open to continued education for compensatory strategies and maximizing independence in ADL     Vision Patient Visual Report:  No change from baseline       Perception     Praxis      Pertinent Vitals/Pain Pain Assessment: 0-10 Pain Score: 8  Pain Location: cervical neck, right at incision Pain Descriptors / Indicators: Moaning;Sharp Pain  Intervention(s): Limited activity within patient's tolerance;Monitored during session;Repositioned     Hand Dominance Right   Extremity/Trunk Assessment Upper Extremity Assessment Upper Extremity Assessment: RUE deficits/detail;LUE deficits/detail RUE Deficits / Details: tingling/numbness in digits 4 and 5: "I have to scribble my name", cannot button buttons RUE Sensation: decreased light touch(4th and 5th finger) RUE Coordination: decreased fine motor LUE Deficits / Details: FF to 30degrees in supine, full ROM at elbow, decreased grasp "It feels heavy" LUE Sensation: decreased light touch LUE Coordination: decreased fine motor;decreased gross motor   Lower Extremity Assessment Lower Extremity Assessment: Defer to PT evaluation   Cervical / Trunk Assessment Cervical / Trunk Assessment: Other exceptions Cervical / Trunk Exceptions: recent cervical surgery    Communication Communication Communication: No difficulties   Cognition Arousal/Alertness: Awake/alert Behavior During Therapy: WFL for tasks assessed/performed Overall Cognitive Status: Within Functional Limits for tasks assessed                                     General Comments       Exercises     Shoulder Instructions      Home Living Family/patient expects to be discharged to:: Private residence Living Arrangements: Other relatives(Sister) Available Help at Discharge: Family;Available PRN/intermittently Type of Home: House Home Access: Stairs to enter [employer] of Steps: 2 Entrance Stairs-Rails: None Home Layout: One level     Bathroom Shower/Tub: Tub/shower unit;Curtain   Firefighter: Standard Bathroom Accessibility: No   Home Equipment: Walker - 2 wheels          Prior Functioning/Environment Level of Independence: Independent with assistive device(s)        Comments: Has been using RW since d/c from cervical surgery.         OT Problem List: Decreased  strength;Decreased range of motion;Impaired balance (sitting and/or standing);Pain;Impaired UE functional use;Decreased activity tolerance;Decreased knowledge of use of DME or AE;Decreased knowledge of precautions;Impaired sensation      OT Treatment/Interventions: Self-care/ADL training;Balance training;Patient/family education;Therapeutic activities;Therapeutic exercise;DME and/or AE instruction    OT Goals(Current goals can be found in the care plan section) Acute Rehab OT Goals Patient Stated Goal: to be able to sit up and walk again OT Goal Formulation: With patient/family Time For Goal Achievement: 06/14/18 Potential to Achieve Goals: Good ADL Goals Pt Will Perform Grooming: with supervision;standing Pt Will Perform Upper Body Dressing: with set-up;sitting;with adaptive equipment Pt Will Perform Lower Body Dressing: with set-up;sit to/from stand;with adaptive equipment Pt Will Transfer to Toilet: with supervision;ambulating;regular height toilet Pt Will Perform Toileting - Clothing Manipulation and hygiene: with supervision;sit to/from stand Pt Will Perform Tub/Shower Transfer: Tub transfer;ambulating;shower seat Additional ADL Goal #1: Pt will be able to recall cervical precautions and maintain throughout ADL routine at indpendent level  OT Frequency: Min 3X/week   Barriers to D/C:            Co-evaluation              AM-PAC PT "6 Clicks" Daily Activity     Outcome Measure Help from another person eating meals?: A Lot Help from another person taking care of personal grooming?: A Lot Help from another person toileting, which  includes using toliet, bedpan, or urinal?: A Lot Help from another person bathing (including washing, rinsing, drying)?: A Lot Help from another person to put on and taking off regular upper body clothing?: A Lot Help from another person to put on and taking off regular lower body clothing?: A Lot 6 Click Score: 12   End of Session    Activity  Tolerance:   Patient left:    OT Visit Diagnosis: Unsteadiness on feet (R26.81);Other abnormalities of gait and mobility (R26.89);Pain;Muscle weakness (generalized) (M62.81) Pain - part of body: (cervical)                Time: 1610-9604 OT Time Calculation (min): 20 min Charges:  OT General Charges $OT Visit: 1 Visit OT Evaluation $OT Eval Moderate Complexity: 1 Mod  Sherryl Manges OTR/L Acute Rehabilitation Services Pager: [phone] Office: [phone]  Evern Bio Felesha Moncrieffe 05/31/2018, 12:46 PM

## 2018-05-31 NOTE — Progress Notes (Signed)
PROGRESS NOTE    RODERT HINCH  ZOX:[id number] DOB: 1946/09/02 DOA: 05/28/2018 PCP: Mliss Sax, MD   Brief Narrative:  Ian Ramos is a 72 y.o. male with medical history significant for chronic kidney disease stage III and cervical stenosis with myelopathy status post cervical laminectomy on 05/23/2018, now presenting to the emergency department for evaluation of chest pain.  the chest pain is substernal, associated with change in the position, worse when sitting up and better laying down. He also reports left shoulder pain and unable to lift the left shoulder from pain.    Assessment & Plan:   Principal Problem:   Intractable pain Active Problems:   Cervical myelopathy (HCC)   Chest pain   CKD (chronic kidney disease), stage III (HCC)   Esophageal thickening   LBBB (left bundle branch block)   Postoperative anemia   Atypical chest pain:  Unclear etiology. Reports its completely resolved, now the pain is int he left shoulder joint and worse when moving his arm and the shoulder.  Differential include ACS, vs esophageal spasm vs musculoskeletal pain vs pleuritis.   ACS ruled out with negative troponins.  EKG shows LBBB, unclear if this is new or old. No ischemic changes. Overnight telemetry does not show any ischemic changes.  Echocardiogram is ordered , showed LVEF of 35 to 40%, with diffuse hypokinesis, with abnormal left ventricular relaxation, grade 1 diastolic dysfunction. No pericardial effusion. CTA shows circumferential thickening of the esophagus.  Pt reports using up to 6 tab of ibuprofen before the cervical laminectomy.  GI consulted for recommendations, underwent barium swallow today, showed moderate dysmotility.  ppi was added and recommended outpatient follow up with GI for screening colonoscopy.  Pt currently chest pain free.  Awaiting inpatient rehab consult.    Cervical myelopathy:  S/p laminectomy on 8/23 . Has a neck collar.   ED  Discussed with Dr Jordan Likes, recommended the chest pain not related to the surgery.  neurontin and brief course of steroids ordered by neurosurgery.  Working with PT, recommended INPATIENT rehab consult.    Stage 3 CKD: Outpatient follow up with PCP.   Mild normocytic anemia: Hemoglobin stable around 10.  Continue to monitor.    Constipation; stool softeners ordered.   DVT prophylaxis:scd's Code Status: full code.  Family Communication: none  at bedside.  Disposition Plan: pending inpatient rehab consult.  Consultants:   None.   Procedures:CTA of the chest.  Antimicrobials: none.   Subjective: No chest pain or sob. Left shoulder joint pain.    Objective: Vitals:   05/31/18 0500 05/31/18 0819 05/31/18 1200 05/31/18 1716  BP: 138/89 135/67 139/66 (!) 148/48  Pulse:  61 61   Resp: 14 17 19 15   Temp:  (!) 96.6 F (35.9 C) 97.9 F (36.6 C) (!) 97.5 F (36.4 C)  TempSrc:  Axillary Oral Axillary  SpO2: 99% 99% 99% 98%  Weight:      Height:        Intake/Output Summary (Last 24 hours) at 05/31/2018 1816 Last data filed at 05/31/2018 0426 Gross per 24 hour  Intake 222 ml  Output 1000 ml  Net -778 ml   Filed Weights   05/28/18 1035 05/28/18 2056 05/31/18 0425  Weight: 79.4 kg 76.1 kg 73.4 kg    Examination:  General exam: Appears calm and comfortable , not in distress.  Respiratory system: air entry fair. No wheezing .  Cardiovascular system: S1 & S2 heard, RRR. No JVD , Gastrointestinal system: Abdomen is  soft NT ND BS+ Central nervous system: Alert and oriented. Non focal. Extremities: no pedal edema, NO CYANOSIS OR clubbing.  Skin: No rashes, lesions or ulcers Psychiatry:Mood & affect appropriate.     Data Reviewed: I have personally reviewed following labs and imaging studies  CBC: Recent Labs  Lab 05/26/18 0808 05/28/18 1037 05/30/18 0319  WBC 11.6* 10.5 7.9  NEUTROABS 9.7*  --   --   HGB 9.3* 10.3* 9.7*  HCT 28.5* 31.4* 29.9*  MCV 91.1 89.5 89.8   PLT 187 224 231   Basic Metabolic Panel: Recent Labs  Lab 05/26/18 0808 05/28/18 1037 05/29/18 1101  NA 138 137 136  K 4.4 4.4 4.7  CL 103 101 104  CO2 26 27 24   GLUCOSE 123* 126* 133*  BUN 31* 26* 27*  CREATININE 1.47* 1.39* 1.42*  CALCIUM 8.6* 8.8* 8.2*   GFR: Estimated Creatinine Clearance: 44.1 mL/min (A) (by C-G formula based on SCr of 1.42 mg/dL (H)). Liver Function Tests: No results for input(s): AST, ALT, ALKPHOS, BILITOT, PROT, ALBUMIN in the last 168 hours. No results for input(s): LIPASE, AMYLASE in the last 168 hours. No results for input(s): AMMONIA in the last 168 hours. Coagulation Profile: No results for input(s): INR, PROTIME in the last 168 hours. Cardiac Enzymes: Recent Labs  Lab 05/28/18 2207 05/29/18 1548  TROPONINI <0.03 <0.03   BNP (last 3 results) No results for input(s): PROBNP in the last 8760 hours. HbA1C: No results for input(s): HGBA1C in the last 72 hours. CBG: No results for input(s): GLUCAP in the last 168 hours. Lipid Profile: No results for input(s): CHOL, HDL, LDLCALC, TRIG, CHOLHDL, LDLDIRECT in the last 72 hours. Thyroid Function Tests: No results for input(s): TSH, T4TOTAL, FREET4, T3FREE, THYROIDAB in the last 72 hours. Anemia Panel: Recent Labs    05/30/18 0319  VITAMINB12 203  FOLATE 8.9  FERRITIN 121  TIBC 273  IRON 29*  RETICCTPCT 3.1   Sepsis Labs: No results for input(s): PROCALCITON, LATICACIDVEN in the last 168 hours.  Recent Results (from the past 240 hour(s))  Urine culture     Status: None   Collection Time: 05/26/18  9:25 AM  Result Value Ref Range Status   Specimen Description URINE, CLEAN CATCH  Final   Special Requests NONE  Final   Culture   Final    NO GROWTH Performed at [facility], [address], [place], [place] [address]    Report Status 05/27/2018 FINAL  Final         Radiology Studies: Dg Esophagus  Result Date: 05/30/2018 CLINICAL DATA:  Esophageal wall thickening on  CT EXAM: ESOPHOGRAM/BARIUM SWALLOW TECHNIQUE: Single contrast examination was performed using  thin barium. FLUOROSCOPY TIME:  Fluoroscopy Time:  42 seconds Radiation Exposure Index (if provided by the fluoroscopic device): 12.5 mGy Number of Acquired Spot Images: 8 COMPARISON:  CTA chest dated 05/28/2018 FINDINGS: Limited evaluation due to difficulty with patient positioning and inability to perform double-contrast evaluation. Single contrast evaluation in the recumbent supine position was performed. No mucosal irregularity to suggest esophageal mass. No fixed esophageal narrowing or stricture. Moderate esophageal dysmotility. No evidence of hiatal hernia. Visualized stomach is grossly unremarkable. IMPRESSION: Moderate esophageal dysmotility. Otherwise negative single-contrast esophagram. Electronically Signed   By: Charline Bills M.D.   On: 05/30/2018 09:30        Scheduled Meds: . aspirin EC  81 mg Oral Daily  . gabapentin  300 mg Oral TID  . [START ON 06/01/2018] methylPREDNISolone  4 mg Oral 4X daily taper  . methylPREDNISolone  8 mg Oral Nightly  . pantoprazole  40 mg Oral BID   Continuous Infusions:    LOS: 0 days    Time spent: 25 min    Kathlen Mody, MD [facility] Pager [phone]   If 7PM-7AM, please contact night-coverage [email or web address] Password [facility] 05/31/2018, 6:16 PM

## 2018-05-31 NOTE — Consult Note (Signed)
Physical Medicine and Rehabilitation Consult Reason for Consult: left shoulder pain, weakness Referring Physician: Blake Divine   HPI: Ian Ramos is a 72 y.o. male with a history of CKD III and cervical stenosis/myelopathy who underwent cervical decompression and fusion a week ago. He was re-admitted on 05/28/18 with chest pain. NS feels that chest/left shoulder pain is related to persistent C5 radiculopathy. CT revealed potential esophagitis. Esophagram was negative, and GI has signed off. He has been working with therapy, and pain has been a major inhibiting factor. PM&R was consulted to assess for potential inpatient rehab needs.      Review of Systems  Constitutional: Negative for chills.  HENT: Negative for hearing loss.   Eyes: Negative for double vision.  Respiratory: Positive for cough.   Cardiovascular: Positive for chest pain.  Gastrointestinal: Negative for nausea and vomiting.  Genitourinary: Negative for urgency.  Musculoskeletal: Positive for joint pain, myalgias and neck pain.  Skin: Negative for rash.  Neurological: Positive for focal weakness and headaches.  Psychiatric/Behavioral: Negative for depression.   Past Medical History:  Diagnosis Date  . Arthritis   . Neuromuscular disorder [facility])    Past Surgical History:  Procedure Laterality Date  . POSTERIOR CERVICAL FUSION/FORAMINOTOMY N/A 05/23/2018   Procedure: Posterior Cervical Fusion with lateral mass fixation - C1 - C6 with laminectomy;  Surgeon: Julio Sicks, MD;  Location: [facility] OR;  Service: Neurosurgery;  Laterality: N/A;   Family History  Problem Relation Age of Onset  . Hypertension Other    Social History:  reports that he has never smoked. His smokeless tobacco use includes chew. He reports that he does not drink alcohol or use drugs. Allergies: No Known Allergies Medications Prior to Admission  Medication Sig Dispense Refill  . HYDROcodone-acetaminophen (NORCO) 10-325 MG tablet Take 1 tablet  by mouth every 6 (six) hours as needed for moderate pain.    Marland Kitchen ibuprofen (ADVIL,MOTRIN) 200 MG tablet Take 400 mg by mouth every 6 (six) hours as needed for moderate pain.      Home: Home Living Family/patient expects to be discharged to:: Private residence Living Arrangements: Other relatives(Sister) Available Help at Discharge: Family, Available PRN/intermittently Type of Home: House Home Access: Stairs to enter [employer] of Steps: 2 Entrance Stairs-Rails: None Home Layout: One level Bathroom Shower/Tub: Tub/shower unit, Engineer, building services: Standard Bathroom Accessibility: No Home Equipment: Environmental consultant - 2 wheels  Functional History: Prior Function Level of Independence: Independent with assistive device(s) Comments: Has been using RW since d/c from cervical surgery.  Functional Status:  Mobility: Bed Mobility Overal bed mobility: Needs Assistance Bed Mobility: Rolling, Sidelying to Sit, Sit to Sidelying Rolling: Min guard Sidelying to sit: Min assist(trunk elevation) Sit to sidelying: Min assist(BLE back into bed) General bed mobility comments: Pt able to sit EOB for approx 1 min for urination Transfers Overall transfer level: Needs assistance Equipment used: Rolling walker (2 wheeled) Transfers: Sit to/from Stand Sit to Stand: Min assist, +2 safety/equipment General transfer comment: not attempted without 2nd person to assist Ambulation/Gait Ambulation/Gait assistance: Min assist, +2 safety/equipment Gait Distance (Feet): 3 Feet Assistive device: Rolling walker (2 wheeled) Gait Pattern/deviations: Step-to pattern General Gait Details: patient able to take 3 steps forward and 3 steps backward before pain increased to point he could not continue.      ADL: ADL Overall ADL's : Needs assistance/impaired Eating/Feeding: Moderate assistance, Bed level Eating/Feeding Details (indicate cue type and reason): sister assisting in the room Grooming: Set up, Bed  level Upper Body Bathing: Moderate assistance, Bed level Lower Body Bathing: Maximal assistance, Bed level Upper Body Dressing : Maximal assistance Lower Body Dressing: Total assistance, Bed level Toilet Transfer: (unable at this time) Toilet Transfer Details (indicate cue type and reason): min A to sit EOB, unable to urinate in urinal due to pain Functional mobility during ADLs: (unable this session, unsafe without 2 assist) General ADL Comments: Pt open to continued education for compensatory strategies and maximizing independence in ADL  Cognition: Cognition Overall Cognitive Status: Within Functional Limits for tasks assessed Orientation Level: Oriented X4 Cognition Arousal/Alertness: Awake/alert Behavior During Therapy: WFL for tasks assessed/performed Overall Cognitive Status: Within Functional Limits for tasks assessed Area of Impairment: Problem solving, Safety/judgement Safety/Judgement: Decreased awareness of safety, Decreased awareness of deficits Problem Solving: Slow processing, Requires verbal cues General Comments: Pt very unaware of safety and deficits and required multiple cues not to throw himself into supine to avoid jolting his neck.   Blood pressure (!) 149/73, pulse 66, temperature 97.8 F (36.6 C), temperature source Oral, resp. rate 15, height 5' 7.5" (1.715 m), weight 73.4 kg, SpO2 99 %. Physical Exam  No results found for this or any previous visit (from the past 24 hour(s)). No results found.  Assessment/Plan: Diagnosis: Pt with cervical stenosis and myelopathy s/p surgical decompression and fusion over a week ago. Presented with chest pain/left shoulder/neck pain. Pain may be related to C4 or C5 radiculopathy.  1. Does the need for close, 24 hr/day medical supervision in concert with the patient's rehab needs make it unreasonable for this patient to be served in a less intensive setting? Potentially 2. Co-Morbidities requiring supervision/potential  complications: CKD, LBBB 3. Due to bladder management, bowel management, safety, skin/wound care, disease management, medication administration, pain management and patient education, does the patient require 24 hr/day rehab nursing? Potentially 4. Does the patient require coordinated care of a physician, rehab nurse, PT (1-2 hrs/day, 5 days/week) and OT (1-2 hrs/day, 5 days/week) to address physical and functional deficits in the context of the above medical diagnosis(es)? Potentially Addressing deficits in the following areas: balance, endurance, locomotion, strength, transferring, bowel/bladder control, bathing, dressing, feeding, grooming, toileting and psychosocial support 5. Can the patient actively participate in an intensive therapy program of at least 3 hrs of therapy per day at least 5 days per week? Potentially 6. The potential for patient to make measurable gains while on inpatient rehab is fair 7. Anticipated functional outcomes upon discharge from inpatient rehab are modified independent  with PT, modified independent and supervision with OT, n/a with SLP. 8. Estimated rehab length of stay to reach the above functional goals is: TBD 9. Anticipated D/C setting: Home 10. Anticipated post D/C treatments: HH therapy 11. Overall Rehab/Functional Prognosis: excellent  RECOMMENDATIONS: This patient's condition is appropriate for continued rehabilitative care in the following setting: see below Patient has agreed to participate in recommended program. Yes Note that insurance prior authorization may be required for reimbursement for recommended care.  Comment: Pt with chronic left shoulder weakness due to his cervical stenosis, radiculopathy. Was doing well up until Wednesday of last week. Now with increased shoulder and neck pain. Eliminate the pain, and he would otherwise be doing well. Will follow for improvement in pain levels, consider brief CIR admit depending upon his pain and functional  progress.    Ranelle Oyster, MD 06/01/2018

## 2018-05-31 NOTE — Plan of Care (Signed)
  Problem: Education: Goal: Knowledge of General Education information will improve Description Including pain rating scale, medication(s)/side effects and non-pharmacologic comfort measures Outcome: Progressing   Problem: Clinical Measurements: Goal: Ability to maintain clinical measurements within normal limits will improve Outcome: Progressing   Problem: Activity: Goal: Risk for activity intolerance will decrease Outcome: Progressing   

## 2018-05-31 NOTE — Progress Notes (Signed)
Rehab Admissions Coordinator Note:  Per PT recommendation, Patient was screened by [name] for appropriateness for an Inpatient Acute Rehab Consult.  At this time, we are recommending Inpatient Rehab consult. AC will contact MD regarding IP Rehab Consult Order.   [name] 05/31/2018, 11:24 AM  I can be reached at [phone].

## 2018-05-31 NOTE — Progress Notes (Signed)
Physical Therapy Treatment Patient Details Name: Ian Ramos MRN: [id number] DOB: Sep 16, 1946 Today's Date: 05/31/2018    History of Present Illness Pt is a 72 y/o male admitted secondary to intractable chest and L shoulder pain of unclear etiology. Pt with recent cervical fusion. Imaging negative for PE and showed probable esophagitis. Imaging of C spine showed post op fluid collection. PMH includes gout and recent Cervical fusion.     PT Comments    Patient making slow but steady progress with mobility. Major limiting factor is pain.  I feel patient could benefit from inpatient rehab to progress mobility and for pain management to reach max potential.     Follow Up Recommendations  CIR     Equipment Recommendations  None recommended by PT    Recommendations for Other Services       Precautions / Restrictions Precautions Precautions: Fall;Cervical Required Braces or Orthoses: Cervical Brace Cervical Brace: At all times;Soft collar    Mobility  Bed Mobility Overal bed mobility: Needs Assistance Bed Mobility: Rolling;Sidelying to Sit;Sit to Sidelying Rolling: Min guard Sidelying to sit: Min assist     Sit to sidelying: Min assist General bed mobility comments: patient able to get to sitting EOB x 2.  The first time, pain increased to unbearable point and patient quickly laid back down.  2nd attempt, patient able to sit EOB longer and able to stand before needing to return to supine.  Transfers Overall transfer level: Needs assistance Equipment used: Rolling walker (2 wheeled) Transfers: Sit to/from Stand Sit to Stand: Min assist;+2 safety/equipment            Ambulation/Gait Ambulation/Gait assistance: Min assist;+2 safety/equipment Gait Distance (Feet): 3 Feet Assistive device: Rolling walker (2 wheeled) Gait Pattern/deviations: Step-to pattern     General Gait Details: patient able to take 3 steps forward and 3 steps backward before pain increased to  point he could not continue.     Stairs             Wheelchair Mobility    Modified Rankin (Stroke Patients Only)       Balance Overall balance assessment: Needs assistance Sitting-balance support: Feet supported;No upper extremity supported Sitting balance-Leahy Scale: Fair Sitting balance - Comments: better able to balance sitting EOB today   Standing balance support: Bilateral upper extremity supported;During functional activity Standing balance-Leahy Scale: Poor Standing balance comment: reliant on RW for support                            Cognition Arousal/Alertness: Awake/alert Behavior During Therapy: WFL for tasks assessed/performed Overall Cognitive Status: Within Functional Limits for tasks assessed                                        Exercises      General Comments        Pertinent Vitals/Pain Pain Assessment: 0-10 Pain Score: 8 (at worst) Pain Location: cervical neck, right at incision Pain Descriptors / Indicators: Moaning;Sharp Pain Intervention(s): Limited activity within patient's tolerance;Monitored during session;Premedicated before session;Repositioned    Home Living                      Prior Function            PT Goals (current goals can now be found in the care plan section) Progress towards  PT goals: Progressing toward goals    Frequency    Min 3X/week      PT Plan Discharge plan needs to be updated    Co-evaluation              AM-PAC PT "6 Clicks" Daily Activity  Outcome Measure  Difficulty turning over in bed (including adjusting bedclothes, sheets and blankets)?: A Lot Difficulty moving from lying on back to sitting on the side of the bed? : Unable Difficulty sitting down on and standing up from a chair with arms (e.g., wheelchair, bedside commode, etc,.)?: Unable Help needed moving to and from a bed to chair (including a wheelchair)?: A Lot Help needed walking in  hospital room?: A Lot Help needed climbing 3-5 steps with a railing? : Total 6 Click Score: 9    End of Session Equipment Utilized During Treatment: Cervical collar Activity Tolerance: Patient limited by pain Patient left: in bed;with call bell/phone within reach;with bed alarm set;with family/visitor present   PT Visit Diagnosis: Pain;Unsteadiness on feet (R26.81);Muscle weakness (generalized) (M62.81);Difficulty in walking, not elsewhere classified (R26.2) Pain - Right/Left: Left Pain - part of body: Shoulder     Time: 2500-3704 PT Time Calculation (min) (ACUTE ONLY): 28 min  Charges:  $Therapeutic Activity: 8-22 mins   *MD in during session                   05/31/2018 Corlis Hove, PT Acute Rehabilitation Services Pager:  [phone] Office:  [phone]     Olivia Canter 05/31/2018, 11:15 AM

## 2018-06-01 DIAGNOSIS — K228 Other specified diseases of esophagus: Secondary | ICD-10-CM | POA: Diagnosis not present

## 2018-06-01 DIAGNOSIS — R52 Pain, unspecified: Secondary | ICD-10-CM | POA: Diagnosis not present

## 2018-06-01 DIAGNOSIS — G959 Disease of spinal cord, unspecified: Secondary | ICD-10-CM | POA: Diagnosis not present

## 2018-06-01 DIAGNOSIS — N183 Chronic kidney disease, stage 3 (moderate): Secondary | ICD-10-CM | POA: Diagnosis not present

## 2018-06-01 LAB — BASIC METABOLIC PANEL
ANION GAP: 9 (ref 5–15)
BUN: 33 mg/dL — ABNORMAL HIGH (ref 8–23)
CHLORIDE: 103 mmol/L (ref 98–111)
CO2: 23 mmol/L (ref 22–32)
Calcium: 8.3 mg/dL — ABNORMAL LOW (ref 8.9–10.3)
Creatinine, Ser: 1.5 mg/dL — ABNORMAL HIGH (ref 0.61–1.24)
GFR calc non Af Amer: 44 mL/min — ABNORMAL LOW (ref >60)
GFR, EST AFRICAN AMERICAN: 52 mL/min — AB (ref >60)
Glucose, Bld: 155 mg/dL — ABNORMAL HIGH (ref 70–99)
POTASSIUM: 4.9 mmol/L (ref 3.5–5.1)
SODIUM: 135 mmol/L (ref 135–145)

## 2018-06-01 NOTE — Progress Notes (Signed)
Pt sat up on side of bed with assistance. Pt tolerated well. Pt back to bed with call bell within reach.  Ian Ramos

## 2018-06-01 NOTE — Progress Notes (Signed)
PROGRESS NOTE    Ian Ramos  KGM:[id number] DOB: September 11, 1946 DOA: 05/28/2018 PCP: Mliss Sax, MD   Brief Narrative:  Ian Ramos is a 72 y.o. male with medical history significant for chronic kidney disease stage III and cervical stenosis with myelopathy status post cervical laminectomy on 05/23/2018, now presenting to the emergency department for evaluation of chest pain.  the chest pain is substernal, associated with change in the position, worse when sitting up and better laying down. He also reports left shoulder pain and unable to lift the left shoulder from pain.    Assessment & Plan:   Principal Problem:   Intractable pain Active Problems:   Cervical myelopathy (HCC)   Chest pain   CKD (chronic kidney disease), stage III (HCC)   Esophageal thickening   LBBB (left bundle branch block)   Postoperative anemia   Atypical chest pain:  Unclear etiology. Reports its completely resolved, now the pain is int he left shoulder joint and worse when moving his arm and the shoulder.  Differential include ACS, vs esophageal spasm vs musculoskeletal pain vs pleuritis.   ACS ruled out with negative troponins.  EKG shows LBBB, unclear if this is new or old. No ischemic changes. Overnight telemetry does not show any ischemic changes.  Echocardiogram is ordered , showed LVEF of 35 to 40%, with diffuse hypokinesis, with abnormal left ventricular relaxation, grade 1 diastolic dysfunction. No pericardial effusion. CTA shows circumferential thickening of the esophagus.  Pt reports using up to 6 tab of ibuprofen before the cervical laminectomy.  GI consulted for recommendations, underwent barium swallow today, showed moderate dysmotility.  ppi was added and recommended outpatient follow up with GI for screening colonoscopy.  Pt currently chest pain free.  Awaiting inpatient rehab consult.  No change in medications at this time.    Cervical myelopathy:  S/p laminectomy on  8/23 . Has a neck collar.   ED Discussed with Dr Jordan Likes, recommended the chest pain not related to the surgery.  neurontin and brief course of steroids ordered by neurosurgery.  Working with PT, recommended INPATIENT rehab consult.  Rehab consulted and pending recommendations.    Stage 3 CKD: Outpatient follow up with PCP.  Check bmp today.   Mild normocytic anemia: Hemoglobin stable around 10.  Continue to monitor.    Constipation; stool softeners ordered.  No BM yet.   DVT prophylaxis:scd's Code Status: full code.  Family Communication: none  at bedside.  Disposition Plan: pending inpatient rehab consult.  Consultants:   None.   Procedures:CTA of the chest.  Antimicrobials: none.   Subjective: Patient does not complain of any chest pain or shortness of breath, has some neck stiffness and left shoulder pain.   Objective: Vitals:   05/31/18 2040 05/31/18 2351 06/01/18 0354 06/01/18 0808  BP: 137/72 118/72 119/68 (!) 149/73  Pulse: 61 (!) 55 (!) 57 66  Resp: 14 17 15    Temp: 98.7 F (37.1 C) 98.2 F (36.8 C) 97.8 F (36.6 C) 97.8 F (36.6 C)  TempSrc: Oral Oral Oral Oral  SpO2: 99% 97% 98% 99%  Weight:      Height:        Intake/Output Summary (Last 24 hours) at 06/01/2018 1024 Last data filed at 05/31/2018 1700 Gross per 24 hour  Intake 780 ml  Output 200 ml  Net 580 ml   Filed Weights   05/28/18 1035 05/28/18 2056 05/31/18 0425  Weight: 79.4 kg 76.1 kg 73.4 kg    Examination:  General exam: Laying in bed, calm and comfortable. Respiratory system: Entry bilaterally fair, no wheezing or rhonchi Cardiovascular system: S1 & S2 heard, RRR. No JVD , Gastrointestinal system: Abdomen is soft nontender, nondistended with good bowel sounds Central nervous system: Alert and oriented. Non focal. Extremities: no pedal edema, NO CYANOSIS OR clubbing.  Skin: No rashes, lesions or ulcers Psychiatry:Mood & affect appropriate.     Data Reviewed: I have personally  reviewed following labs and imaging studies  CBC: Recent Labs  Lab 05/26/18 0808 05/28/18 1037 05/30/18 0319  WBC 11.6* 10.5 7.9  NEUTROABS 9.7*  --   --   HGB 9.3* 10.3* 9.7*  HCT 28.5* 31.4* 29.9*  MCV 91.1 89.5 89.8  PLT 187 224 231   Basic Metabolic Panel: Recent Labs  Lab 05/26/18 0808 05/28/18 1037 05/29/18 1101  NA 138 137 136  K 4.4 4.4 4.7  CL 103 101 104  CO2 26 27 24   GLUCOSE 123* 126* 133*  BUN 31* 26* 27*  CREATININE 1.47* 1.39* 1.42*  CALCIUM 8.6* 8.8* 8.2*   GFR: Estimated Creatinine Clearance: 44.1 mL/min (A) (by C-G formula based on SCr of 1.42 mg/dL (H)). Liver Function Tests: No results for input(s): AST, ALT, ALKPHOS, BILITOT, PROT, ALBUMIN in the last 168 hours. No results for input(s): LIPASE, AMYLASE in the last 168 hours. No results for input(s): AMMONIA in the last 168 hours. Coagulation Profile: No results for input(s): INR, PROTIME in the last 168 hours. Cardiac Enzymes: Recent Labs  Lab 05/28/18 2207 05/29/18 1548  TROPONINI <0.03 <0.03   BNP (last 3 results) No results for input(s): PROBNP in the last 8760 hours. HbA1C: No results for input(s): HGBA1C in the last 72 hours. CBG: No results for input(s): GLUCAP in the last 168 hours. Lipid Profile: No results for input(s): CHOL, HDL, LDLCALC, TRIG, CHOLHDL, LDLDIRECT in the last 72 hours. Thyroid Function Tests: No results for input(s): TSH, T4TOTAL, FREET4, T3FREE, THYROIDAB in the last 72 hours. Anemia Panel: Recent Labs    05/30/18 0319  VITAMINB12 203  FOLATE 8.9  FERRITIN 121  TIBC 273  IRON 29*  RETICCTPCT 3.1   Sepsis Labs: No results for input(s): PROCALCITON, LATICACIDVEN in the last 168 hours.  Recent Results (from the past 240 hour(s))  Urine culture     Status: None   Collection Time: 05/26/18  9:25 AM  Result Value Ref Range Status   Specimen Description URINE, CLEAN CATCH  Final   Special Requests NONE  Final   Culture   Final    NO GROWTH Performed  at [facility], [address] [place], [place] [address]    Report Status 05/27/2018 FINAL  Final         Radiology Studies: No results found.      Scheduled Meds: . aspirin EC  81 mg Oral Daily  . gabapentin  300 mg Oral TID  . methylPREDNISolone  4 mg Oral 4X daily taper  . pantoprazole  40 mg Oral BID   Continuous Infusions:    LOS: 0 days    Time spent: 25 min    Kathlen Mody, MD [facility] Pager [phone]   If 7PM-7AM, please contact night-coverage [email or web address] Password [facility] 06/01/2018, 10:24 AM

## 2018-06-02 DIAGNOSIS — K228 Other specified diseases of esophagus: Secondary | ICD-10-CM | POA: Diagnosis not present

## 2018-06-02 DIAGNOSIS — R52 Pain, unspecified: Secondary | ICD-10-CM | POA: Diagnosis not present

## 2018-06-02 DIAGNOSIS — R9389 Abnormal findings on diagnostic imaging of other specified body structures: Secondary | ICD-10-CM | POA: Diagnosis not present

## 2018-06-02 MED ORDER — MAGNESIUM HYDROXIDE 400 MG/5ML PO SUSP
30.0000 mL | Freq: Once | ORAL | Status: AC
  Administered 2018-06-02: 30 mL via ORAL
  Filled 2018-06-02: qty 30

## 2018-06-02 NOTE — Progress Notes (Signed)
PROGRESS NOTE    Ian Ramos  PHX:[id number] DOB: Jun 06, 1946 DOA: 05/28/2018 PCP: Mliss Sax, MD   Brief Narrative:  Ian Ramos is a 72 y.o. male with medical history significant for chronic kidney disease stage III and cervical stenosis with myelopathy status post cervical laminectomy on 05/23/2018, now presenting to the emergency department for evaluation of chest pain.  the chest pain is substernal, associated with change in the position, worse when sitting up and better laying down. He also reports left shoulder pain and unable to lift the left shoulder from pain.    Assessment & Plan:   Principal Problem:   Intractable pain Active Problems:   Cervical myelopathy (HCC)   Chest pain   CKD (chronic kidney disease), stage III (HCC)   Esophageal thickening   LBBB (left bundle branch block)   Postoperative anemia   Atypical chest pain:  Unclear etiology. Reports he doesn ot have it right now, but may be at night he had some pain in the chest with some left shoulder pain.  Differential include ACS, vs esophageal spasm vs musculoskeletal pain vs pleuritis.   ACS ruled out with negative troponins.  EKG shows LBBB, unclear if this is new or old. No ischemic changes. Overnight telemetry does not show any ischemic changes.  Echocardiogram is ordered , showed LVEF of 35 to 40%, with diffuse hypokinesis, with abnormal left ventricular relaxation, grade 1 diastolic dysfunction. No pericardial effusion. CTA shows circumferential thickening of the esophagus.  Pt reports using up to 6 tab of ibuprofen before the cervical laminectomy.  GI consulted for recommendations, underwent barium swallow today, showed moderate dysmotility.  ppi was added and recommended outpatient follow up with GI for screening colonoscopy.  Pt currently chest pain free.  Awaiting inpatient rehab consult.  No change in medications at this time.  Meanwhile cardiology consulted for his intermittent  chest pain and left shoulder pain.    Cervical myelopathy:  S/p laminectomy on 8/23 . Has a neck collar.   ED Discussed with Dr Jordan Likes, recommended the chest pain not related to the surgery.  neurontin and brief course of steroids ordered by neurosurgery.  Working with PT, recommended INPATIENT rehab consult.  Rehab consulted and pending recommendations.    Stage 3 CKD: Outpatient follow up with PCP.  Check bmp today.   Mild normocytic anemia: Hemoglobin stable around 10.  Continue to monitor.    Constipation; stool softeners ordered.  No BM yet. mOM ordered.   DVT prophylaxis:scd's Code Status: full code.  Family Communication:sister at bedside.  Disposition Plan: pending inpatient rehab consult.  Consultants:   Cardiology  Neuro surgery.   Inpatient rehabilitation.   Procedures:CTA of the chest.  Echocardiogram.   Antimicrobials: none.   Subjective: Left shoulder pain persistent, pain better with pain meds.    Objective: Vitals:   06/02/18 0407 06/02/18 0907 06/02/18 1125 06/02/18 1631  BP: 114/60 126/64 (!) 156/78 136/81  Pulse: 60 65 66 65  Resp: 19 18 19  (!) 23  Temp: 98.2 F (36.8 C) 97.6 F (36.4 C) 97.9 F (36.6 C) 98.3 F (36.8 C)  TempSrc: Oral Oral Oral Oral  SpO2: 99% 96% 97% 99%  Weight:      Height:        Intake/Output Summary (Last 24 hours) at 06/02/2018 1728 Last data filed at 06/02/2018 1300 Gross per 24 hour  Intake 600 ml  Output 475 ml  Net 125 ml   Filed Weights   05/28/18 1035 05/28/18  2056 05/31/18 0425  Weight: 79.4 kg 76.1 kg 73.4 kg    Examination:  General exam: Laying in bed, calm and comfortable.not on oxygen, with neck  collar.  Respiratory system: air entry fair, no wheezing or rhonchi. No palpable tenderness.  Cardiovascular system: S1 & S2 heard, RRR. No JVD, no murmer.  , Gastrointestinal system: Abdomen is  Soft NT ND BS+ Central nervous system: Alert and oriented. Non focal. Extremities: no pedal edema,     Skin: No rashes, lesions or ulcers Psychiatry:Mood & affect appropriate.     Data Reviewed: I have personally reviewed following labs and imaging studies  CBC: Recent Labs  Lab 05/28/18 1037 05/30/18 0319  WBC 10.5 7.9  HGB 10.3* 9.7*  HCT 31.4* 29.9*  MCV 89.5 89.8  PLT 224 231   Basic Metabolic Panel: Recent Labs  Lab 05/28/18 1037 05/29/18 1101 06/01/18 1057  NA 137 136 135  K 4.4 4.7 4.9  CL 101 104 103  CO2 27 24 23   GLUCOSE 126* 133* 155*  BUN 26* 27* 33*  CREATININE 1.39* 1.42* 1.50*  CALCIUM 8.8* 8.2* 8.3*   GFR: Estimated Creatinine Clearance: 41.8 mL/min (A) (by C-G formula based on SCr of 1.5 mg/dL (H)). Liver Function Tests: No results for input(s): AST, ALT, ALKPHOS, BILITOT, PROT, ALBUMIN in the last 168 hours. No results for input(s): LIPASE, AMYLASE in the last 168 hours. No results for input(s): AMMONIA in the last 168 hours. Coagulation Profile: No results for input(s): INR, PROTIME in the last 168 hours. Cardiac Enzymes: Recent Labs  Lab 05/28/18 2207 05/29/18 1548  TROPONINI <0.03 <0.03   BNP (last 3 results) No results for input(s): PROBNP in the last 8760 hours. HbA1C: No results for input(s): HGBA1C in the last 72 hours. CBG: No results for input(s): GLUCAP in the last 168 hours. Lipid Profile: No results for input(s): CHOL, HDL, LDLCALC, TRIG, CHOLHDL, LDLDIRECT in the last 72 hours. Thyroid Function Tests: No results for input(s): TSH, T4TOTAL, FREET4, T3FREE, THYROIDAB in the last 72 hours. Anemia Panel: No results for input(s): VITAMINB12, FOLATE, FERRITIN, TIBC, IRON, RETICCTPCT in the last 72 hours. Sepsis Labs: No results for input(s): PROCALCITON, LATICACIDVEN in the last 168 hours.  Recent Results (from the past 240 hour(s))  Urine culture     Status: None   Collection Time: 05/26/18  9:25 AM  Result Value Ref Range Status   Specimen Description URINE, CLEAN CATCH  Final   Special Requests NONE  Final   Culture    Final    NO GROWTH Performed at [facility], [address] [place], [place] [address]    Report Status 05/27/2018 FINAL  Final         Radiology Studies: No results found.      Scheduled Meds: . aspirin EC  81 mg Oral Daily  . gabapentin  300 mg Oral TID  . methylPREDNISolone  4 mg Oral 4X daily taper  . pantoprazole  40 mg Oral BID   Continuous Infusions:    LOS: 0 days    Time spent: 25 min    Kathlen Mody, MD [facility] Pager [phone]   If 7PM-7AM, please contact night-coverage [email or web address] Password [facility] 06/02/2018, 5:28 PM

## 2018-06-02 NOTE — Progress Notes (Signed)
Occupational Therapy Treatment Patient Details Name: Ian Ramos MRN: [id number] DOB: 26-Feb-1946 Today's Date: 06/02/2018    History of present illness Pt is a 72 y/o male admitted secondary to intractable chest and L shoulder pain. Neurosurgery feels this is radicular irritation of his left-sided C5 nerve root and started pt on steroids and neurontin. Pt with recent cervical fusion. Imaging negative for PE and showed probable esophagitis. Imaging of C spine showed post op fluid collection. PMH includes gout and recent Cervical fusion.    OT comments  Pt progressing towards established OT goals. Pt performing urination at toilet with Min A for standing balance and safety. Pt requiring Min Guard A-Min A at sink during hand hygiene. Pt requiring increased time and cues during session for processing and problem solving. Pt highly motivated to participate in therapy and return to PLOF. Continue to recommend dc to CIR and will continue to follow acutely as admitted.    Follow Up Recommendations  CIR;Supervision/Assistance - 24 hour    Equipment Recommendations  (Defer to next venue)    Recommendations for Other Services      Precautions / Restrictions Precautions Precautions: Fall;Cervical Precaution Booklet Issued: No Precaution Comments: Pt unable to precall cervical precaution. Reviewed cervical precations verbally with pt and wife. Required Braces or Orthoses: Cervical Brace Cervical Brace: At all times;Soft collar Restrictions Weight Bearing Restrictions: No       Mobility Bed Mobility Overal bed mobility: Needs Assistance Bed Mobility: Rolling;Sidelying to Sit Rolling: Min assist Sidelying to sit: Min assist       General bed mobility comments: Min A to roll towards left and then elevate trunk into sitting  Transfers Overall transfer level: Needs assistance Equipment used: Rolling walker (2 wheeled) Transfers: Sit to/from Stand Sit to Stand: Min assist          General transfer comment: Assist for balance and safety. Verbal cues for hand placement.     Balance Overall balance assessment: Needs assistance Sitting-balance support: No upper extremity supported;Feet supported Sitting balance-Leahy Scale: Fair     Standing balance support: Bilateral upper extremity supported Standing balance-Leahy Scale: Poor Standing balance comment: walker and min guard for static standing                           ADL either performed or assessed with clinical judgement   ADL Overall ADL's : Needs assistance/impaired     Grooming: Wash/dry face;Min guard;Minimal assistance;Standing Grooming Details (indicate cue type and reason): Min Guard-Min A for safety in standing.                  Toilet Transfer: Minimal assistance;Ambulation;RW(Simulated to recliner)   Toileting- Clothing Manipulation and Hygiene: Minimal assistance;Sit to/from stand Toileting - Clothing Manipulation Details (indicate cue type and reason): Min A for managing clothing while urinating standing at toilet.      Functional mobility during ADLs: Minimal assistance;Rolling walker General ADL Comments: Pt presenting with decreased balance, strength, and activity tolerance. Pt motivated to particiapte in therapy and return to PLOF. Wife present and very supportive.     Vision       Perception     Praxis      Cognition Arousal/Alertness: Awake/alert Behavior During Therapy: Flat affect Overall Cognitive Status: Impaired/Different from baseline Area of Impairment: Problem solving;Safety/judgement                         Safety/Judgement: Decreased  awareness of safety   Problem Solving: Requires verbal cues General Comments: Pt presenting with decreased problem solving and requiring increased time and cues throughout session.        Exercises     Shoulder Instructions       General Comments Wife present throughout session    Pertinent  Vitals/ Pain       Pain Assessment: Faces Faces Pain Scale: Hurts whole lot Pain Location: L shoulder; upper back Pain Descriptors / Indicators: Sharp;Aching Pain Intervention(s): Limited activity within patient's tolerance;Monitored during session;Repositioned  Home Living                                          Prior Functioning/Environment              Frequency  Min 3X/week        Progress Toward Goals  OT Goals(current goals can now be found in the care plan section)  Progress towards OT goals: Progressing toward goals  Acute Rehab OT Goals Patient Stated Goal: to be able to sit up and walk again OT Goal Formulation: With patient/family Time For Goal Achievement: 06/14/18 Potential to Achieve Goals: Good ADL Goals Pt Will Perform Grooming: with supervision;standing Pt Will Perform Upper Body Dressing: with set-up;sitting;with adaptive equipment Pt Will Perform Lower Body Dressing: with set-up;sit to/from stand;with adaptive equipment Pt Will Transfer to Toilet: with supervision;ambulating;regular height toilet Pt Will Perform Toileting - Clothing Manipulation and hygiene: with supervision;sit to/from stand Pt Will Perform Tub/Shower Transfer: Tub transfer;ambulating;shower seat Additional ADL Goal #1: Pt will be able to recall cervical precautions and maintain throughout ADL routine at indpendent level  Plan Discharge plan remains appropriate    Co-evaluation                 AM-PAC PT "6 Clicks" Daily Activity     Outcome Measure   Help from another person eating meals?: A Little Help from another person taking care of personal grooming?: A Little Help from another person toileting, which includes using toliet, bedpan, or urinal?: A Little Help from another person bathing (including washing, rinsing, drying)?: A Lot Help from another person to put on and taking off regular upper body clothing?: A Little Help from another person to  put on and taking off regular lower body clothing?: A Lot 6 Click Score: 16    End of Session Equipment Utilized During Treatment: Cervical collar;Rolling walker;Gait belt  OT Visit Diagnosis: Unsteadiness on feet (R26.81);Other abnormalities of gait and mobility (R26.89);Pain;Muscle weakness (generalized) (M62.81) Pain - Right/Left: Left Pain - part of body: Shoulder(Upper back)   Activity Tolerance Patient tolerated treatment well   Patient Left in chair;with call bell/phone within reach;with chair alarm set;with family/visitor present   Nurse Communication Mobility status        Time: 2952-8413 OT Time Calculation (min): 15 min  Charges: OT General Charges $OT Visit: 1 Visit OT Treatments $Self Care/Home Management : 8-22 mins  Kallee Nam MSOT, OTR/L Acute Rehab Pager: [phone] Office: [phone]   Theodoro Grist Genese Quebedeaux 06/02/2018, 5:16 PM

## 2018-06-02 NOTE — Progress Notes (Signed)
Inpatient Rehabilitation-Admissions Coordinator    Met with patient and his significant other at the bedside to discuss team's recommendation for inpatient rehabilitation. Shared booklets, expectations while in CIR, expected length of stay, and anticipated functional level at DC. Pt wanting to pursue CIR at this time. AC will begin insurance authorization process for possible CIR admit.   Will follow up with pt tomorrow.   Jhonnie Garner, OTR/L  Rehab Admissions Coordinator  [phone] 06/02/2018 4:17 PM

## 2018-06-02 NOTE — Progress Notes (Signed)
Physical Therapy Treatment Patient Details Name: Ian Ramos MRN: [id number] DOB: Aug 30, 1946 Today's Date: 06/02/2018    History of Present Illness Pt is a 72 y/o male admitted secondary to intractable chest and L shoulder pain. Neurosurgery feels this is radicular irritation of his left-sided C5 nerve root and started pt on steroids and neurontin. Pt with recent cervical fusion. Imaging negative for PE and showed probable esophagitis. Imaging of C spine showed post op fluid collection. PMH includes gout and recent Cervical fusion.     PT Comments    Pt with improving pain and activity tolerance. Able to amb short distance in hallway but gait unsteady and pain returned to a 9 after ambulation. Continue to recommend CIR.    Follow Up Recommendations  CIR     Equipment Recommendations  None recommended by PT    Recommendations for Other Services       Precautions / Restrictions Precautions Precautions: Fall;Cervical Precaution Booklet Issued: No Required Braces or Orthoses: Cervical Brace Cervical Brace: At all times;Soft collar Restrictions Weight Bearing Restrictions: No    Mobility  Bed Mobility               General bed mobility comments: Pt up in recliner  Transfers Overall transfer level: Needs assistance Equipment used: Rolling walker (2 wheeled) Transfers: Sit to/from Stand Sit to Stand: Min assist         General transfer comment: Assist for balance and safety. Verbal cues for hand placement.   Ambulation/Gait Ambulation/Gait assistance: Min assist Gait Distance (Feet): 120 Feet Assistive device: Rolling walker (2 wheeled) Gait Pattern/deviations: Step-through pattern;Decreased step length - right;Decreased step length - left;Ataxic;Trunk flexed Gait velocity: decr Gait velocity interpretation: 1.31 - 2.62 ft/sec, indicative of limited community ambulator General Gait Details: assist for balance. Pt reports increased pain with  activity   Stairs             Wheelchair Mobility    Modified Rankin (Stroke Patients Only)       Balance Overall balance assessment: Needs assistance Sitting-balance support: No upper extremity supported;Feet supported Sitting balance-Leahy Scale: Fair     Standing balance support: Bilateral upper extremity supported Standing balance-Leahy Scale: Poor Standing balance comment: walker and min guard for static standing                            Cognition Arousal/Alertness: Awake/alert Behavior During Therapy: Flat affect Overall Cognitive Status: Impaired/Different from baseline Area of Impairment: Problem solving;Safety/judgement                         Safety/Judgement: Decreased awareness of safety   Problem Solving: Requires verbal cues General Comments: Pt began to stand unassisted despite cues to wait until I had lines untangled and second gown on      Exercises      General Comments General comments (skin integrity, edema, etc.): wife present for treatment      Pertinent Vitals/Pain Pain Assessment: 0-10 Pain Score: 9  Pain Location: L shoulder  Pain Descriptors / Indicators: Sharp;Aching Pain Intervention(s): Limited activity within patient's tolerance;Monitored during session;Repositioned;Premedicated before session    Home Living                      Prior Function            PT Goals (current goals can now be found in the care plan section) Progress  towards PT goals: Progressing toward goals    Frequency    Min 3X/week      PT Plan Current plan remains appropriate    Co-evaluation              AM-PAC PT "6 Clicks" Daily Activity  Outcome Measure  Difficulty turning over in bed (including adjusting bedclothes, sheets and blankets)?: Unable Difficulty moving from lying on back to sitting on the side of the bed? : Unable Difficulty sitting down on and standing up from a chair with arms (e.g.,  wheelchair, bedside commode, etc,.)?: Unable Help needed moving to and from a bed to chair (including a wheelchair)?: A Little Help needed walking in hospital room?: A Little Help needed climbing 3-5 steps with a railing? : A Lot 6 Click Score: 11    End of Session Equipment Utilized During Treatment: Cervical collar;Gait belt Activity Tolerance: Patient limited by pain Patient left: with call bell/phone within reach;with family/visitor present;in chair Nurse Communication: Mobility status(nurse tech) PT Visit Diagnosis: Pain;Unsteadiness on feet (R26.81);Muscle weakness (generalized) (M62.81);Difficulty in walking, not elsewhere classified (R26.2) Pain - Right/Left: Left Pain - part of body: Shoulder     Time: 9381-8299 PT Time Calculation (min) (ACUTE ONLY): 17 min  Charges:  $Gait Training: 8-22 mins                     [facility] PT Acute Rehabilitation Services Pager [phone] Office [phone]    Angelina Ok [facility] 06/02/2018, 9:41 AM

## 2018-06-03 ENCOUNTER — Encounter (HOSPITAL_COMMUNITY): Payer: Self-pay | Admitting: Medical

## 2018-06-03 DIAGNOSIS — R079 Chest pain, unspecified: Secondary | ICD-10-CM

## 2018-06-03 DIAGNOSIS — G959 Disease of spinal cord, unspecified: Secondary | ICD-10-CM | POA: Diagnosis not present

## 2018-06-03 DIAGNOSIS — I420 Dilated cardiomyopathy: Secondary | ICD-10-CM | POA: Diagnosis not present

## 2018-06-03 DIAGNOSIS — I447 Left bundle-branch block, unspecified: Secondary | ICD-10-CM

## 2018-06-03 DIAGNOSIS — I429 Cardiomyopathy, unspecified: Secondary | ICD-10-CM | POA: Diagnosis not present

## 2018-06-03 MED ORDER — METOPROLOL TARTRATE 12.5 MG HALF TABLET: 12.5000 mg | ORAL_TABLET | Freq: Two times a day (BID) | ORAL | Status: DC

## 2018-06-03 MED ORDER — LOSARTAN POTASSIUM 25 MG PO TABS: 25.0000 mg | ORAL_TABLET | Freq: Every day | ORAL | Status: DC

## 2018-06-03 MED ORDER — HEPARIN SODIUM (PORCINE) 5000 UNIT/ML IJ SOLN
5000.0000 [IU] | Freq: Three times a day (TID) | INTRAMUSCULAR | Status: DC
  Administered 2018-06-03 – 2018-06-05 (×7): 5000 [IU] via SUBCUTANEOUS
  Filled 2018-06-03 (×6): qty 1

## 2018-06-03 MED ORDER — SODIUM CHLORIDE 0.9 % IV SOLN
INTRAVENOUS | Status: DC
  Administered 2018-06-04 – 2018-06-05 (×3): via INTRAVENOUS

## 2018-06-03 MED ORDER — LISINOPRIL 2.5 MG PO TABS
2.5000 mg | ORAL_TABLET | Freq: Every day | ORAL | Status: DC
  Administered 2018-06-03: 2.5 mg via ORAL
  Filled 2018-06-03: qty 1

## 2018-06-03 MED ORDER — POLYETHYLENE GLYCOL 3350 17 G PO PACK
17.0000 g | PACK | Freq: Two times a day (BID) | ORAL | Status: AC
  Administered 2018-06-04: 17 g via ORAL
  Filled 2018-06-03: qty 1

## 2018-06-03 MED ORDER — METOPROLOL TARTRATE 50 MG PO TABS: 50.0000 mg | ORAL_TABLET | Freq: Once | ORAL | Status: DC

## 2018-06-03 MED ORDER — POLYETHYLENE GLYCOL 3350 17 G PO PACK
34.0000 g | PACK | Freq: Once | ORAL | Status: AC
  Administered 2018-06-03: 34 g via ORAL
  Filled 2018-06-03: qty 2

## 2018-06-03 MED ORDER — SENNOSIDES-DOCUSATE SODIUM 8.6-50 MG PO TABS
2.0000 | ORAL_TABLET | Freq: Two times a day (BID) | ORAL | Status: DC
  Administered 2018-06-03 – 2018-06-09 (×11): 2 via ORAL
  Filled 2018-06-03 (×11): qty 2

## 2018-06-03 NOTE — Progress Notes (Signed)
Inpatient Rehabilitation-Admissions Coordinator   [facility] still awaiting insurance decision regarding possible CIR admit.   [name], OTR/L  Rehab Admissions Coordinator  [phone] 06/03/2018 5:42 PM

## 2018-06-03 NOTE — Progress Notes (Signed)
Physical Therapy Treatment Patient Details Name: Ian Ramos MRN: [id number] DOB: 10/06/1945 Today's Date: 06/03/2018    History of Present Illness Pt is a 72 y/o male admitted secondary to intractable chest and L shoulder pain. Neurosurgery feels this is radicular irritation of his left-sided C5 nerve root and started pt on steroids and neurontin. Pt with recent cervical fusion. Imaging negative for PE and showed probable esophagitis. Imaging of C spine showed post op fluid collection. PMH includes gout and recent Cervical fusion.     PT Comments    Pt making slow, steady progress. Continues to have significant lt shoulder/UE pain with upright posture and activity.   Follow Up Recommendations  CIR     Equipment Recommendations  None recommended by PT    Recommendations for Other Services       Precautions / Restrictions Precautions Precautions: Fall;Cervical Precaution Booklet Issued: No Required Braces or Orthoses: Cervical Brace Cervical Brace: At all times;Soft collar Restrictions Weight Bearing Restrictions: No    Mobility  Bed Mobility Overal bed mobility: Needs Assistance Bed Mobility: Rolling;Sidelying to Sit;Sit to Sidelying Rolling: Min assist Sidelying to sit: Min assist     Sit to sidelying: Min assist General bed mobility comments: Assist to bring shoulders over to roll and to elevate trunk into sitting. Assist to bring legs back up into bed. Verbal cues for technique.  Transfers Overall transfer level: Needs assistance Equipment used: Rolling walker (2 wheeled) Transfers: Sit to/from Stand Sit to Stand: Min assist         General transfer comment: Assist for balance and safety. Verbal cues for hand placement.   Ambulation/Gait Ambulation/Gait assistance: Min assist Gait Distance (Feet): 160 Feet Assistive device: Rolling walker (2 wheeled) Gait Pattern/deviations: Step-through pattern;Decreased step length - right;Decreased step length -  left;Trunk flexed;Drifts right/left Gait velocity: decr Gait velocity interpretation: 1.31 - 2.62 ft/sec, indicative of limited community ambulator General Gait Details: Assist for balance and verbal cues for posture and to look up and stand more erect   Stairs             Wheelchair Mobility    Modified Rankin (Stroke Patients Only)       Balance Overall balance assessment: Needs assistance Sitting-balance support: No upper extremity supported;Feet supported Sitting balance-Leahy Scale: Fair     Standing balance support: Bilateral upper extremity supported Standing balance-Leahy Scale: Poor Standing balance comment: walker and min guard for static standing                            Cognition Arousal/Alertness: Awake/alert Behavior During Therapy: Flat affect Overall Cognitive Status: Impaired/Different from baseline Area of Impairment: Problem solving                             Problem Solving: Requires verbal cues General Comments: As pt's pain increases he begins to rush with activity      Exercises      General Comments        Pertinent Vitals/Pain Pain Assessment: 0-10 Faces Pain Scale: Hurts whole lot Pain Location: L shoulder  Pain Descriptors / Indicators: Sharp;Aching Pain Intervention(s): Limited activity within patient's tolerance;Monitored during session;Repositioned    Home Living                      Prior Function            PT Goals (  current goals can now be found in the care plan section) Progress towards PT goals: Progressing toward goals    Frequency    Min 3X/week      PT Plan Current plan remains appropriate    Co-evaluation              AM-PAC PT "6 Clicks" Daily Activity  Outcome Measure  Difficulty turning over in bed (including adjusting bedclothes, sheets and blankets)?: Unable Difficulty moving from lying on back to sitting on the side of the bed? : Unable Difficulty  sitting down on and standing up from a chair with arms (e.g., wheelchair, bedside commode, etc,.)?: Unable Help needed moving to and from a bed to chair (including a wheelchair)?: A Little Help needed walking in hospital room?: A Little Help needed climbing 3-5 steps with a railing? : A Lot 6 Click Score: 11    End of Session Equipment Utilized During Treatment: Cervical collar;Gait belt Activity Tolerance: Patient limited by pain Patient left: with call bell/phone within reach;with family/visitor present;in bed Nurse Communication: Mobility status PT Visit Diagnosis: Pain;Unsteadiness on feet (R26.81);Muscle weakness (generalized) (M62.81);Difficulty in walking, not elsewhere classified (R26.2) Pain - Right/Left: Left Pain - part of body: Shoulder     Time: 9675-9163 PT Time Calculation (min) (ACUTE ONLY): 22 min  Charges:  $Gait Training: 8-22 mins                     [facility] PT Acute Rehabilitation Services Pager [phone] Office [phone]    Angelina Ok [facility] 06/03/2018, 3:45 PM

## 2018-06-03 NOTE — Progress Notes (Signed)
After receiving Milk of Magnesium and a Senokot tablet last night, pt was still unable to have a bowel movement.  Also, pt states he has episodes when he thinks he's standing up or in a different area while in bed. Wonders if it's a reaction to the new medicines he's taking.  Will inform on coming nurse.  Harriet Masson, RN

## 2018-06-03 NOTE — PMR Pre-admission (Signed)
PMR Admission Coordinator Pre-Admission Assessment  Patient: OSMIN WELZ is an 72 y.o., male MRN: [id number] DOB: 04-14-1946 Height: 5' 7.5" (171.5 cm) Weight: 74.8 kg              Insurance Information HMO:     PPO: Yes     PCP:      IPA:      80/20:      OTHER:  PRIMARY: BCBS      Policy#: [id number]      Subscriber: Patient CM Name: Lean      Phone#: [phone]     Fax#: [phone]  Auth provided by Denny Peon at [facility] on 06/18/18 for admit to CIR on 06/19/18. Clinical updates are due 06/24/18 using rehab tool; Per CM, please include statement from acute PT regarding tolerance for CIR during first clinical update. Fax only department.  Pre-Cert#:  [id number]      Employer:  Benefits:  Phone #: NA     Name: [email or web address] Eff. Date: 10/08/10     Deduct: $200 (met $200)      Out of Pocket Max: $1,000 (Met $186.40)      Life Max:  CIR: 80%/20%      SNF: 38%/93% necessity pre-cert with network Outpatient: 100% first day, 80%/20% any treatment after; necessity pre-cert with network   Co-Pay: (see "Outpatient") 20% if treatment after day 1. Home Health: 80%, necessity pre-cert with network      Co-Pay: 20% DME: 80%     Co-Pay: 20% Providers:  SECONDARY: Medicare Part A and B      Policy#: [id number]      Subscriber: Patient CM Name:       Phone#:      Fax#:  Pre-Cert#:       Employer:  Benefits:  Phone #:      Name:  Eff. Date:      Deduct:       Out of Pocket Max:       Life Max:  CIR:       SNF:  Outpatient:      Co-Pay:  Home Health:       Co-Pay:  DME:      Co-Pay:   Medicaid Application Date:       Case Manager:  Disability Application Date:       Case Worker:   Emergency Contact Information Contact Information    Name Relation Home Work [place] Significant other [phone]  [phone]   [facility] Daughter   [phone]   Stephannie Peters [phone]  [phone]     Current Medical History  Patient Admitting Diagnosis: Pt with cervical  stenosis and myelopathy s/p surgical decompression and fusion over a week ago. Presented with chest pain/left shoulder/neck pain. Pain may be related to C4 or C5 radiculopathy.   History of Present Illness:Niki T. Fine is a 72 year old right-handed male with history of CKD stage III and cervical stenosis with myelopathy who underwent cervical decompression and fusion 05/23/2018 by Dr. Annette Stable and discharged home 05/25/2018 ambulating at a supervision level 300 feet with rolling walker. Supportive care of significant other.  Had been using a rolling walker since recent cervical surgery.  One level home with 2 steps to entry.  He was readmitted 05/28/2018 with chest pain and left shoulder pain.  Troponin negative, mildly elevated creatinine 1.39.  Chest x-ray no acute disease.  Neurosurgery follow-up felt that chest and left shoulder pain related to persistent C5 radiculopathy.  CT cervical spine  showed no fracture deformity or malalignment.  Postoperative fluid collection and gas consistent with recent surgery.  CT angiogram of chest showing some potential esophagitis.  Esophagram was negative after GI follow-up and signed off.  Echocardiogram with ejection fraction of 40% diffuse hypokinesis grade 1 diastolic dysfunction.  EKG showed left bundle branch block no ischemic changes.  Cardiology service follow-up for question EKG changes with cardiac Myoview study completed completed 06/04/2018 with findings consistent with ischemia ejection fraction 39%.  Underwent cardiac catheterization 06/05/2018 showing occluded acute marginal, 75% proximal LAD, 75% OM 2 and 30% proximal to mid RCA.  Medical management was recommended and was cleared to begin aspirin and Plavix.  Post cardiac catheterization with lethargy and altered mental status changes.  Rapid response was called.  CT/MRI showed subcentimeter acute early subacute infarctions, 2 in the left superior frontal lobe and 2 in the right posterior lateral parietal lobe.   No associated mass-effect.  MRA with minimal flow related signal in the left cervical ICA and absent flow related signal within the petrous and cavernous segments of the left ICA.  Neurology service is consulted for follow-up.  Carotid Doppler showed right ICA 80 to 90% stenosis left ICA occluded.  On 06/09/2018 patient found unresponsive in his chair blood pressures systolic in the 87F heart rate in the 50s.  A code was called patient received atropine was intubated for respiratory support.  Patient's creatinine did bump up to 1.98 BUN 36 placed on gentle IV fluids.  Vascular surgery/Dr. Oneida Alar consulted for severe ICA disease tentatively planning for transfemoral carotid stent and await plan for procedure.  Presently on a dysphagia #2 honey thick liquid diet.  Therapy evaluations have been completed and ongoing with recommendations of physical medicine rehab consult.  Patient is to be admitted for a comprehensive rehab program on 06/19/18.   Complete NIHSS TOTAL: 6    Past Medical History  Past Medical History:  Diagnosis Date  . Arthritis   . Neuromuscular disorder ([place])     Family History  family history includes CAD in his father; Heart failure in his father and mother; Hypertension in his other.  Prior Rehab/Hospitalizations:  Has the patient had major surgery during 100 days prior to admission? Yes  Current Medications   Current Facility-Administered Medications:  .   stroke: mapping our early stages of recovery book, , Does not apply, Once, Biby, Sharon L, NP .  0.9 %  sodium chloride infusion, 250 mL, Intravenous, PRN, [place], Peter M, MD, Last Rate: 10 mL/hr at 06/09/18 1610 .  0.9 %  sodium chloride infusion, , Intravenous, Continuous, Erick Colace, NP, Last Rate: 75 mL/hr at 06/19/18 0600 .  acetaminophen (TYLENOL) suppository 650 mg, 650 mg, Rectal, Q4H PRN, Elger[place]awy, Silver Huguenin, MD, 650 mg at 06/16/18 2152 .  acetaminophen (TYLENOL) tablet 650 mg, 650 mg, Oral, Q4H PRN,  [place], Peter M, MD, 650 mg at 06/07/18 0910 .  aspirin chewable tablet 162 mg, 162 mg, Per Tube, Daily, Rush Farmer, MD, 162 mg at 06/19/18 0908 .  atorvastatin (LIPITOR) tablet 80 mg, 80 mg, Per Tube, q1800, Erick Colace, NP, 80 mg at 06/18/18 1714 .  carvedilol (COREG) tablet 3.125 mg, 3.125 mg, Oral, BID WC, Satira Sark, MD, 3.125 mg at 06/19/18 0908 .  chlorhexidine gluconate (MEDLINE KIT) (PERIDEX) 0.12 % solution 15 mL, 15 mL, Mouth Rinse, BID, Vance Gather B, MD, 15 mL at 06/18/18 2000 .  cloNIDine (CATAPRES - Dosed in mg/24 hr) patch 0.1  mg, 0.1 mg, Transdermal, Weekly, Rush Farmer, MD, 0.1 mg at 06/12/18 1100 .  clopidogrel (PLAVIX) tablet 75 mg, 75 mg, Per Tube, Daily, Rush Farmer, MD, 75 mg at 06/19/18 0908 .  enoxaparin (LOVENOX) injection 40 mg, 40 mg, Subcutaneous, Q24H, [place], Peter M, MD, 40 mg at 06/19/18 0909 .  famotidine (PEPCID) tablet 20 mg, 20 mg, Oral, Daily, Kris Mouton, RPH, 20 mg at 06/19/18 8676 .  fentaNYL (SUBLIMAZE) injection 50 mcg, 50 mcg, Intravenous, Q2H PRN, Erick Colace, NP, 50 mcg at 06/12/18 0844 .  insulin aspart (novoLOG) injection 0-15 Units, 0-15 Units, Subcutaneous, Q4H, Icard, Bradley L, DO, 3 Units at 06/19/18 0909 .  iopamidol (ISOVUE-370) 76 % injection 100 mL, 100 mL, Intravenous, Once, [place], Peter M, MD .  MEDLINE mouth rinse, 15 mL, Mouth Rinse, BID, Rush Farmer, MD, 15 mL at 06/18/18 2200 .  RESOURCE THICKENUP CLEAR, , Oral, PRN, Rush Farmer, MD .  sodium chloride flush (NS) 0.9 % injection 3 mL, 3 mL, Intravenous, Q12H, [place], Peter M, MD, 3 mL at 06/18/18 2200 .  sodium chloride flush (NS) 0.9 % injection 3 mL, 3 mL, Intravenous, PRN, [place], Peter M, MD  Facility-Administered Medications Ordered in Other Encounters:  .  etomidate (AMIDATE) injection, , , Anesthesia Intra-op, Myna Bright, CRNA, 12 mg at 06/09/18 1445 .  succinylcholine (ANECTINE) injection, , , Anesthesia Intra-op, Myna Bright, CRNA, 100 mg at 06/09/18 1445  Patients Current Diet:  Diet Order            DIET DYS 2 Room service appropriate? Yes; Fluid consistency: Nectar Thick  Diet effective now              Precautions / Restrictions Precautions Precautions: Fall, Cervical Precaution Booklet Issued: No Precaution Comments: reviewed cervical precautions Cervical Brace: Soft collar, At all times Restrictions Weight Bearing Restrictions: No   Has the patient had 2 or more falls or a fall with injury in the past year?Yes  Prior Activity Level Community (5-7x/wk): full time worker; active; has farm   Development worker, international aid / [place] Devices/Equipment: None Home Equipment: Environmental consultant - 2 wheels  Prior Device Use: Indicate devices/aids used by the patient prior to current illness, exacerbation or injury? None of the above  Prior Functional Level Prior Function Level of Independence: Independent with assistive device(s) Comments: Has been using RW since d/c from cervical surgery.   Self Care: Did the patient need help bathing, dressing, using the toilet or eating?  Independent  Indoor Mobility: Did the patient need assistance with walking from room to room (with or without device)? Independent  Stairs: Did the patient need assistance with internal or external stairs (with or without device)? Independent  Functional Cognition: Did the patient need help planning regular tasks such as shopping or remembering to take medications? Independent  Current Functional Level Cognition  Arousal/Alertness: Awake/alert Overall Cognitive Status: Impaired/Different from baseline Current Attention Level: Sustained Orientation Level: Oriented to person, Disoriented to place, Disoriented to time, Disoriented to situation Following Commands: Follows one step commands with increased time, Follows one step commands consistently Safety/Judgement: Decreased awareness of safety, Decreased  awareness of deficits General Comments: Pt requires increased time and cues to initiate and perform all task. Attention: Focused Focused Attention: Impaired Focused Attention Impairment: Verbal basic Memory: Impaired Memory Impairment: Storage deficit, Decreased recall of new information Awareness: Appears intact Problem Solving: Impaired Problem Solving Impairment: Verbal basic Safety/Judgment: Impaired  Extremity Assessment (includes Sensation/Coordination)  Upper Extremity Assessment: Generalized weakness, RUE deficits/detail, LUE deficits/detail RUE Deficits / Details: pt slow to move R UE and needs hand over hand (A) to place hand on stomach RUE Sensation: decreased light touch RUE Coordination: decreased fine motor, decreased gross motor LUE Deficits / Details: poor grasp and decr strength  demonstrating 3 out 5 with bed mobility due to against gravity but not formally tested LUE Sensation: decreased light touch LUE Coordination: decreased fine motor, decreased gross motor  Lower Extremity Assessment: Defer to PT evaluation    ADLs  Overall ADL's : Needs assistance/impaired Eating/Feeding: Total assistance, Bed level Eating/Feeding Details (indicate cue type and reason): RN feeding patient on arriavl Grooming: Total assistance, Sitting Grooming Details (indicate cue type and reason): total (A) to comb hair  Upper Body Bathing: Maximal assistance Lower Body Bathing: Total assistance Upper Body Dressing : Maximal assistance Lower Body Dressing: Total assistance Toilet Transfer: Moderate assistance, Stand-pivot, RW(Simulated to recliner) Toilet Transfer Details (indicate cue type and reason): Mod A for power up and balance to pivot to recliner Toileting- Clothing Manipulation and Hygiene: Minimal assistance(standing) Toileting - Clothing Manipulation Details (indicate cue type and reason): incontinent of bowel on arrival and unaware. pt does not ask for (A) Functional  mobility during ADLs: Moderate assistance, Rolling walker(Stand pivot only) General ADL Comments: Pt requires (A) to elevate from surface and to pivot to chair. pt does not intiate.     Mobility  Overal bed mobility: Needs Assistance Bed Mobility: Rolling, Sidelying to Sit Rolling: Max assist Sidelying to sit: Max assist, Mod assist Supine to sit: Mod assist Sit to sidelying: Min guard General bed mobility comments: Assist to bring shoulders over, elevate trunk into sitting and bring hips to EOB.    Transfers  Overall transfer level: Needs assistance Equipment used: Rolling walker (2 wheeled) Transfer via Lift Equipment: Stedy Transfers: Sit to/from Stand Sit to Stand: Mod assist, +2 safety/equipment Stand pivot transfers: Max assist, +2 physical assistance, +2 safety/equipment, From elevated surface General transfer comment: Assist to bring hips and trunk up. Pt with delay initiating and needs extra time especially to fully extend trunk. Allowed pt to place hands on walker to encourage anterior weight shift.    Ambulation / Gait / Stairs / Wheelchair Mobility  Ambulation/Gait Ambulation/Gait assistance: Mod assist, +2 safety/equipment, Min assist Gait Distance (Feet): 60 Feet Assistive device: Rolling walker (2 wheeled) Gait Pattern/deviations: Step-to pattern, Decreased step length - right, Decreased step length - left, Shuffle, Trunk flexed General Gait Details: Initially required mod assist due to balance.  As distance progressed pt required min assist. Verbal/tactile cues to stand more erect and stay closer to the walker.  Gait velocity: Decreased Gait velocity interpretation: <1.8 ft/sec, indicate of risk for recurrent falls    Posture / Balance Dynamic Sitting Balance Sitting balance - Comments: Sat EOB with supervision x 5-6 minutes Balance Overall balance assessment: Needs assistance Sitting-balance support: Feet supported, No upper extremity supported Sitting  balance-Leahy Scale: Fair Sitting balance - Comments: Sat EOB with supervision x 5-6 minutes Postural control: Posterior lean Standing balance support: Bilateral upper extremity supported, During functional activity Standing balance-Leahy Scale: Poor Standing balance comment: walker and min assist for static standing. Stood x 3-4 minutes to be cleaned due to incontinent of BM.    Special needs/care consideration BiPAP/CPAP: no CPM: no Continuous Drip IV: no Dialysis: no        Days: no Life Vest: no Oxygen: no Special Bed: no Trach Size:  no Wound Vac (area): no      Location: no Skin: abrasion to Right posterior thigh region, blister to left lateral flank, ecchymosis to abdomen, moisture associated damage to bilateral buttocks, right anterior hand skin tear, closed incision on neck.                      Bowel mgmt: last BM: 06/17/18 Bladder mgmt: external catheter in place Diabetic mgmt: no     Previous Home Environment Living Arrangements: Other relatives(Sister)  Lives With: Alone Available Help at Discharge: Family, Available PRN/intermittently Type of Home: House Home Layout: One level Home Access: Stairs to enter Entrance Stairs-Rails: None Entrance Stairs-Number of Steps: 2 Bathroom Shower/Tub: Tub/shower unit, Industrial/product designer: No Home Care Services: No  Discharge Living Setting Plans for Discharge Living Setting: Patient's home(significant other offered to stay with him for 24/7 support) Type of Home at Discharge: House Discharge Home Layout: One level Discharge Home Access: Stairs to enter Entrance Stairs-Rails: None Entrance Stairs-Number of Steps: 2 Discharge Bathroom Shower/Tub: Tub/shower unit Discharge Bathroom Toilet: Standard Discharge Bathroom Accessibility: Yes How Accessible: Accessible via walker Does the patient have any problems obtaining your medications?: No  Social/Family/Support Systems Patient Roles:  Other (Comment)(full time truck Ecologist) Contact Information: significant other is emergency contact Horris Latino) Anticipated Caregiver: Horris Latino  Anticipated Caregiver's Contact Information: Horris Latino: [phone]; [phone] Ability/Limitations of Caregiver: can provide physical assist as needed Caregiver Availability: 24/7 Discharge Plan Discussed with Primary Caregiver: Sande Brothers ) Is Caregiver In Agreement with Plan?: Yes Does Caregiver/Family have Issues with Lodging/Transportation while Pt is in Rehab?: No   Goals/Additional Needs Patient/Family Goal for Rehab: PT: Min A; OT: Min A; SLP: Supervision Expected length of stay: 12-15 days Cultural Considerations: NA Dietary Needs: DYS 2, nectar thick Equipment Needs: TBD Pt/Family Agrees to Admission and willing to participate: Yes Program Orientation Provided & Reviewed with Pt/Caregiver Including Roles  & Responsibilities: Yes(pt and significant other )  Barriers to Discharge: Home environment access/layout  Barriers to Discharge Comments: 2 steps to enter home; nmo hand rails; tub shower    Decrease burden of Care through IP rehab admission: NA  Possible need for SNF placement upon discharge: Not anticipated; pt has good prognosis for further progress with good social support at home   Patient Condition: This patient's medical and functional status has changed since the consult dated: 05/31/18 in which the Rehabilitation Physician determined and documented that the patient's condition is appropriate for intensive rehabilitative care in an inpatient rehabilitation facility. See "History of Present Illness" (above) for medical update. Functional changes are: decline in functional ability since consult as well as decline in overall medical condition since consult. During consult, pt was Min Ax2 for transfers and ambulation up to 3 feet. Currently, pt is Mod A x2 with ambulation up to 60 feet. Patient's medical and functional  status update has been discussed with the Rehabilitation physician and patient remains appropriate for inpatient rehabilitation. Will admit to inpatient rehab today.  Preadmission Screen Completed By:  Jhonnie Garner, 06/19/2018 11:44 AM ______________________________________________________________________   Discussed status with Dr. Letta Pate on 06/19/18 at 2:00PM and received telephone approval for admission today.  Admission Coordinator:  Jhonnie Garner, time 2:00PM Sudie Grumbling 06/19/18.

## 2018-06-03 NOTE — Consult Note (Signed)
Cardiology Consultation:   Patient ID: Ian Ramos; [id number]; [birth year]   Admit date: 05/28/2018 Date of Consult: 06/03/2018  Primary Care Provider: Mliss Sax, MD Primary Cardiologist: New to [facility]; Dr. Mayford Knife Primary Electrophysiologist:  None   Patient Profile:   Ian Ramos is a 72 y.o. male with a PMH of cervical stenosis s/p laminectomy 05/23/18 and CKD stage 3, who is being seen today for the evaluation of acute systolic CHF and LBBB at the request of Dr. Randol Kern.  History of Present Illness:   Mr. Noyola had been recovering from his recent cervical laminectomy (05/23/18) when he began experiencing severe substernal chest pain around 4am on 05/28/18. He attempted to take hydrocodone without relief of symptoms. He reported worsening of pain when moving or trying to sit upright. He denied associated SOB, diaphoresis, dizziness, lightheadedness, syncope, nausea, or vomiting. EMS was activated and he was given ASA en route to the ED. His chest pain resolved shortly after admission but he continues to have left shoulder pain.   He denies prior cardiac history but reports that he does not follow with a doctor routinely. He denies history of HTN, HLD, DM, or abnormal EKG. He has never had an ischemic evaluation. He reports family history of CAD/CHF in his father who underwent CABG in his 51s, and CHF in his mother. He denies smoking history but has used chewing tobacco for 30+ years.   At the time of this evaluation he is chest pain free. He continues to have left shoulder pain when he sits upright or moves around and was told this is an expected feeling after cervical laminectomy. He was fairly active prior to this surgical procedure and denied ever having exertional CP, SOB, or DOE. He denied recent fever, URI, LE edema, orthopnea, or PND.   Hospital course: VSS. Labs notable for electrolytes wnl, Cr 1.5 (baseline), Hgb 9.7 (2/2 post-op anemia;  baseline 13), PLT 231, and trop negative x3. EKG with sinus rhythm and LBBB. CXR without acute findings. CTA Chest without PE but revealed circumferential esophageal wall thickening and cardiomegaly; also noted to have aortic atherosclerosis. CT C-spine with C1-6 laminectomies and post-op fluid collection without acute fracture/deformity. Echo showed EF of 35-40%, G1DD, and diffuse hypokinesis. GI was consulted and he underwent a barium swallow which showed moderate esophageal dysmotility and he was started on a PPI. Cardiology asked to evaluate for acute systolic CHF and LBBB.   Past Medical History:  Diagnosis Date  . Arthritis   . Neuromuscular disorder [facility])     Past Surgical History:  Procedure Laterality Date  . POSTERIOR CERVICAL FUSION/FORAMINOTOMY N/A 05/23/2018   Procedure: Posterior Cervical Fusion with lateral mass fixation - C1 - C6 with laminectomy;  Surgeon: Julio Sicks, MD;  Location: [facility] OR;  Service: Neurosurgery;  Laterality: N/A;     Home Medications:  Prior to Admission medications   Medication Sig Start Date End Date Taking? Authorizing Provider  HYDROcodone-acetaminophen (NORCO) 10-325 MG tablet Take 1 tablet by mouth every 6 (six) hours as needed for moderate pain.   Yes [provider]  ibuprofen (ADVIL,MOTRIN) 200 MG tablet Take 400 mg by mouth every 6 (six) hours as needed for moderate pain.   Yes [provider]    Inpatient Medications: Scheduled Meds: . aspirin EC  81 mg Oral Daily  . gabapentin  300 mg Oral TID  . heparin injection (subcutaneous)  5,000 Units Subcutaneous Q8H  . [START ON 06/04/2018] losartan  25 mg  Oral Daily  . methylPREDNISolone  4 mg Oral 4X daily taper  . [START ON 06/04/2018] metoprolol tartrate  50 mg Oral Once  . pantoprazole  40 mg Oral BID  . polyethylene glycol  17 g Oral BID  . senna-docusate  2 tablet Oral BID   Continuous Infusions: . [START ON 06/04/2018] sodium chloride     PRN Meds: acetaminophen,  ALPRAZolam, bisacodyl, gi cocktail, morphine injection, nitroGLYCERIN, ondansetron (ZOFRAN) IV, oxyCODONE-acetaminophen, senna, zolpidem  Allergies:   No Known Allergies  Social History:   Social History   Socioeconomic History  . Marital status: Divorced    Spouse name: Not on file  . Number of children: Not on file  . Years of education: Not on file  . Highest education level: Not on file  Occupational History  . Not on file  Social Needs  . Financial resource strain: Not on file  . Food insecurity:    Worry: Not on file    Inability: Not on file  . Transportation needs:    Medical: Not on file    Non-medical: Not on file  Tobacco Use  . Smoking status: Never Smoker  . Smokeless tobacco: Current User    Types: Chew  Substance and Sexual Activity  . Alcohol use: No    Frequency: Never  . Drug use: No  . Sexual activity: Not on file  Lifestyle  . Physical activity:    Days per week: Not on file    Minutes per session: Not on file  . Stress: Not on file  Relationships  . Social connections:    Talks on phone: Not on file    Gets together: Not on file    Attends religious service: Not on file    Active member of club or organization: Not on file    Attends meetings of clubs or organizations: Not on file    Relationship status: Not on file  . Intimate partner violence:    Fear of current or ex partner: Not on file    Emotionally abused: Not on file    Physically abused: Not on file    Forced sexual activity: Not on file  Other Topics Concern  . Not on file  Social History Narrative  . Not on file    Family History:    Family History  Problem Relation Age of Onset  . Hypertension Other   . Heart failure Mother   . CAD Father        s/p CABG in his 12s  . Heart failure Father      ROS:  Please see the history of present illness.   All other ROS reviewed and negative.     Physical Exam/Data:   Vitals:   06/02/18 2350 06/03/18 0418 06/03/18 0815  06/03/18 1340  BP: 132/66 137/77 123/73 117/62  Pulse: 60  68 64  Resp: 19 14 16 19   Temp: 97.9 F (36.6 C) 98.2 F (36.8 C) (!) 97.3 F (36.3 C) 98.1 F (36.7 C)  TempSrc: Oral Oral Oral Oral  SpO2: 95% 98% 96% 97%  Weight:      Height:        Intake/Output Summary (Last 24 hours) at 06/03/2018 1522 Last data filed at 06/03/2018 1300 Gross per 24 hour  Intake 942 ml  Output 800 ml  Net 142 ml   Filed Weights   05/28/18 1035 05/28/18 2056 05/31/18 0425  Weight: 79.4 kg 76.1 kg 73.4 kg   Body mass index  is 24.97 kg/m.  General:  Well nourished, well developed, laying in bed in no acute distress HEENT: sclera anicteric  Neck: unable to assess JVD due to C-spine collar Vascular: No carotid bruits; distal pulses 2+ bilaterally Cardiac:  normal S1, S2; RRR; no murmurs, rubs, or gallops Lungs:  clear to auscultation bilaterally, no wheezing, rhonchi or rales  Abd: NABS, soft, nontender, no hepatomegaly Ext: no edema Musculoskeletal:  No deformities, LUE weakness due to cervical spine stenosis/laminectomy Skin: warm and dry  Neuro:  CNs 2-12 intact, no focal abnormalities noted Psych:  Normal affect   EKG:  The EKG was personally reviewed and demonstrates:  Sinus rhythm/ sinus bradycardia with 1st degree AV block and LBBB Telemetry:  Telemetry was personally reviewed and demonstrates:  Sinus rhythm with LBBB  Relevant CV Studies: Echocardiogram 05/29/18: Study Conclusions  - Left ventricle: The cavity size was normal. Systolic function was   moderately reduced. The estimated ejection fraction was in the   range of 35% to 40%. Diffuse hypokinesis. Doppler parameters are   consistent with abnormal left ventricular relaxation (grade 1   diastolic dysfunction). Doppler parameters are consistent with   elevated ventricular end-diastolic filling pressure. - Aortic valve: A bicuspid morphology cannot be excluded; severely   thickened, severely calcified leaflets. There was  trivial   regurgitation. - Mitral valve: There was mild regurgitation. - Left atrium: The atrium was normal in size. - Right atrium: The atrium was normal in size. - Tricuspid valve: There was trivial regurgitation. - Pulmonary arteries: Systolic pressure was within the normal   range. - Inferior vena cava: The vessel was dilated. The respirophasic   diameter changes were blunted (< 50%), consistent with elevated   central venous pressure. - Pericardium, extracardiac: There was no pericardial effusion.   Laboratory Data:  Chemistry Recent Labs  Lab 05/28/18 1037 05/29/18 1101 06/01/18 1057  NA 137 136 135  K 4.4 4.7 4.9  CL 101 104 103  CO2 27 24 23   GLUCOSE 126* 133* 155*  BUN 26* 27* 33*  CREATININE 1.39* 1.42* 1.50*  CALCIUM 8.8* 8.2* 8.3*  GFRNONAA 49* 47* 44*  GFRAA 56* 55* 52*  ANIONGAP 9 8 9     No results for input(s): PROT, ALBUMIN, AST, ALT, ALKPHOS, BILITOT in the last 168 hours. Hematology Recent Labs  Lab 05/28/18 1037 05/30/18 0319  WBC 10.5 7.9  RBC 3.51* 3.33*  3.33*  HGB 10.3* 9.7*  HCT 31.4* 29.9*  MCV 89.5 89.8  MCH 29.3 29.1  MCHC 32.8 32.4  RDW 11.7 11.9  PLT 224 231   Cardiac Enzymes Recent Labs  Lab 05/28/18 2207 05/29/18 1548  TROPONINI <0.03 <0.03    Recent Labs  Lab 05/28/18 1111 05/28/18 1450  TROPIPOC 0.04 0.00    BNPNo results for input(s): BNP, PROBNP in the last 168 hours.  DDimer No results for input(s): DDIMER in the last 168 hours.  Radiology/Studies:  No results found.  Assessment and Plan:   1. Acute systolic CHF: patient presented with atypical chest pain on 05/28/18. Echo this admission with EF 35-40%, G1DD, and diffuse hypokinesis (no comparison). No volume overload complaints and appears euvolemic on exam. Unclear cause at this point given stable BP's and no evidence of tachyarrhythmias on telemetry. Cannot exclude ischemic etiology at this time.  - Will check a TSH  - Will plan for coronary CTA to  further evaluate ischemic cause - Will transition from ACEi to ARB to make transition to entresto easier if necessary.  -  Patient is intermittently bradycardic and noted to have 1st degree AV block so BBlocker not initiated.   2. LBBB: patient presented with atypical chest pain felt to be 2/2 GI etiology. Found to have a LBBB on EKG this admission; unfortunately no comparison EKG is available for review so difficult to assess acuity  - Will plan for coronary CTA to further evaluate ischemic cause  3. Atypical chest pain: patient reported chest pain which was worsened by movement or sitting upright. Felt to be GI related due to negative trops and Chest CTA which showed circumferential thickening of the esophagus. Patient reported frequent ibuprofen use prior to cervical laminectomy. GI was consulted and patient underwent a barium swallow which showed moderate dysmotility and he was recommended for PPI.  - Will plan for coronary CTA to further evaluate ischemic cause  4. Cervical laminectomy: patient underwent surgery on 05/23/18.  - Continue management per primary team  5. CKD stage 3: Cr 1.5 today which appears to be about baseline.  - Will hydrate overnight given plans for coronary CTA tomorrow.  - Continue to monitor closely with titration of medications.    For questions or updates, please contact [facility] Please consult [email or web address] for contact info under Cardiology/STEMI.   Signed, Beatriz Stallion, PA-C  06/03/2018 3:22 PM [phone]

## 2018-06-03 NOTE — Progress Notes (Signed)
PROGRESS NOTE    Ian Ramos  UYE:[id number] DOB: 1946/06/10 DOA: 05/28/2018 PCP: Mliss Sax, MD   Brief Narrative:  Ian Ramos is a 72 y.o. male with medical history significant for chronic kidney disease stage III and cervical stenosis with myelopathy status post cervical laminectomy on 05/23/2018, now presenting to the emergency department for evaluation of chest pain.  the chest pain is substernal, associated with change in the position, worse when sitting up and better laying down. He also reports left shoulder pain and unable to lift the left shoulder from pain.  Work-up significant for left bundle branch block, no old EKG to compare, and 2D echo with EF 35% with global hypokinesis.   Assessment & Plan:   Principal Problem:   Intractable pain Active Problems:   Cervical myelopathy (HCC)   Chest pain   CKD (chronic kidney disease), stage III (HCC)   Esophageal thickening   LBBB (left bundle branch block)   Postoperative anemia   Atypical chest pain:  -Chest pain appears to be nontypical, most likely due to esophageal spasm, had negative troponins, please see discussion below regarding abnormal EKG and echo. -CTA shows circumferential thickening of the esophagus. This is most likely in the setting of esophagitis/esophageal motility.  Cardiomyopathy with a EF of 35% -She denies any cardiac history or work-up in the past, on presentation his EKG showing left bundle branch block, no old EKG to compare, 2D echo was obtained, showing EF 35 to 40%, with diffuse hypokinesis, with abnormal left ventricular relaxation. -We will start the low-dose lisinopril 2.5 mg oral daily, his heart rate in the low 60s, I will hold on initiating any beta-blockers until seen by cardiology. -Cardiology has been consulted to evaluate today.  Esophagitis/esophageal dysmotility - CTA shows circumferential thickening of the esophagus. This is most likely in the setting of  esophagitis/esophageal motility. - GI consulted for recommendations, underwent barium swallow today, showed moderate dysmotility.  - ppi was added and recommended outpatient follow up with GI for screening colonoscopy.   Cervical myelopathy:  - S/p laminectomy on 8/23 . Has a neck collar.  - ED Discussed with Dr Jordan Likes, recommended the chest pain not related to the surgery.  - neurontin and brief course of steroids ordered by neurosurgery.  - Working with PT, recommended INPATIENT rehab consult.  - Rehab consulted and pending recommendations.    Stage 3 CKD: Outpatient follow up with PCP.  Check bmp today.   Mild normocytic anemia: Hemoglobin stable around 10.  Continue to monitor.    Constipation; -Reports last bowel movement was 11 days ago, he started stool softener, and MiraLAX  DVT prophylaxis:scd's, will start on subcu heparin Code Status: full code.  Family Communication: Fianc at bedside at bedside.  Disposition Plan: Plan for CIR, pending bed availability and insurance approval Consultants:   Cardiology  Neuro surgery.   Inpatient rehabilitation.   Procedures:CTA of the chest.  Echocardiogram.   Antimicrobials: none.   Subjective: Reports left shoulder pain, it is improving, he denies any chest pain, reports last bowel movement was 11 days ago   Objective: Vitals:   06/02/18 1942 06/02/18 2350 06/03/18 0418 06/03/18 0815  BP: 131/64 132/66 137/77 123/73  Pulse: 62 60  68  Resp: 14 19 14 16   Temp: 98.3 F (36.8 C) 97.9 F (36.6 C) 98.2 F (36.8 C) (!) 97.3 F (36.3 C)  TempSrc: Oral Oral Oral Oral  SpO2: 96% 95% 98% 96%  Weight:  Height:        Intake/Output Summary (Last 24 hours) at 06/03/2018 1241 Last data filed at 06/03/2018 0815 Gross per 24 hour  Intake 822 ml  Output 800 ml  Net 22 ml   Filed Weights   05/28/18 1035 05/28/18 2056 05/31/18 0425  Weight: 79.4 kg 76.1 kg 73.4 kg    Examination:  Awake Alert, Oriented X 3, No new  F.N deficits, Normal affect Wearing soft neck collar, surgical wound in the back looks clean with no drainage Symmetrical Chest wall movement, Good air movement bilaterally, CTAB RRR,No Gallops,Rubs or new Murmurs, No Parasternal Heave +ve B.Sounds, Abd Soft, No tenderness, No rebound - guarding or rigidity. No Cyanosis, Clubbing or edema, No new Rash or bruise       Data Reviewed: I have personally reviewed following labs and imaging studies  CBC: Recent Labs  Lab 05/28/18 1037 05/30/18 0319  WBC 10.5 7.9  HGB 10.3* 9.7*  HCT 31.4* 29.9*  MCV 89.5 89.8  PLT 224 231   Basic Metabolic Panel: Recent Labs  Lab 05/28/18 1037 05/29/18 1101 06/01/18 1057  NA 137 136 135  K 4.4 4.7 4.9  CL 101 104 103  CO2 27 24 23   GLUCOSE 126* 133* 155*  BUN 26* 27* 33*  CREATININE 1.39* 1.42* 1.50*  CALCIUM 8.8* 8.2* 8.3*   GFR: Estimated Creatinine Clearance: 41.8 mL/min (A) (by C-G formula based on SCr of 1.5 mg/dL (H)). Liver Function Tests: No results for input(s): AST, ALT, ALKPHOS, BILITOT, PROT, ALBUMIN in the last 168 hours. No results for input(s): LIPASE, AMYLASE in the last 168 hours. No results for input(s): AMMONIA in the last 168 hours. Coagulation Profile: No results for input(s): INR, PROTIME in the last 168 hours. Cardiac Enzymes: Recent Labs  Lab 05/28/18 2207 05/29/18 1548  TROPONINI <0.03 <0.03   BNP (last 3 results) No results for input(s): PROBNP in the last 8760 hours. HbA1C: No results for input(s): HGBA1C in the last 72 hours. CBG: No results for input(s): GLUCAP in the last 168 hours. Lipid Profile: No results for input(s): CHOL, HDL, LDLCALC, TRIG, CHOLHDL, LDLDIRECT in the last 72 hours. Thyroid Function Tests: No results for input(s): TSH, T4TOTAL, FREET4, T3FREE, THYROIDAB in the last 72 hours. Anemia Panel: No results for input(s): VITAMINB12, FOLATE, FERRITIN, TIBC, IRON, RETICCTPCT in the last 72 hours. Sepsis Labs: No results for  input(s): PROCALCITON, LATICACIDVEN in the last 168 hours.  Recent Results (from the past 240 hour(s))  Urine culture     Status: None   Collection Time: 05/26/18  9:25 AM  Result Value Ref Range Status   Specimen Description URINE, CLEAN CATCH  Final   Special Requests NONE  Final   Culture   Final    NO GROWTH Performed at [facility], [address] [place], [place] [address]    Report Status 05/27/2018 FINAL  Final         Radiology Studies: No results found.      Scheduled Meds: . aspirin EC  81 mg Oral Daily  . gabapentin  300 mg Oral TID  . lisinopril  2.5 mg Oral Daily  . methylPREDNISolone  4 mg Oral 4X daily taper  . pantoprazole  40 mg Oral BID  . senna-docusate  2 tablet Oral BID   Continuous Infusions:    LOS: 0 days    Huey Bienenstock, MD [facility] Pager [phone]  If 7PM-7AM, please contact night-coverage [email or web address] Password [email or web address] 06/03/2018, 12:41 PM

## 2018-06-04 ENCOUNTER — Inpatient Hospital Stay (HOSPITAL_COMMUNITY): Payer: BLUE CROSS/BLUE SHIELD

## 2018-06-04 DIAGNOSIS — R079 Chest pain, unspecified: Secondary | ICD-10-CM

## 2018-06-04 DIAGNOSIS — I6521 Occlusion and stenosis of right carotid artery: Secondary | ICD-10-CM | POA: Diagnosis not present

## 2018-06-04 DIAGNOSIS — Z23 Encounter for immunization: Secondary | ICD-10-CM | POA: Diagnosis not present

## 2018-06-04 DIAGNOSIS — R1310 Dysphagia, unspecified: Secondary | ICD-10-CM | POA: Diagnosis present

## 2018-06-04 DIAGNOSIS — K5909 Other constipation: Secondary | ICD-10-CM | POA: Diagnosis present

## 2018-06-04 DIAGNOSIS — I6522 Occlusion and stenosis of left carotid artery: Secondary | ICD-10-CM | POA: Diagnosis not present

## 2018-06-04 DIAGNOSIS — I5023 Acute on chronic systolic (congestive) heart failure: Secondary | ICD-10-CM | POA: Diagnosis present

## 2018-06-04 DIAGNOSIS — I447 Left bundle-branch block, unspecified: Secondary | ICD-10-CM | POA: Diagnosis present

## 2018-06-04 DIAGNOSIS — I420 Dilated cardiomyopathy: Secondary | ICD-10-CM | POA: Diagnosis present

## 2018-06-04 DIAGNOSIS — K209 Esophagitis, unspecified: Secondary | ICD-10-CM | POA: Diagnosis present

## 2018-06-04 DIAGNOSIS — I69398 Other sequelae of cerebral infarction: Secondary | ICD-10-CM | POA: Diagnosis not present

## 2018-06-04 DIAGNOSIS — N179 Acute kidney failure, unspecified: Secondary | ICD-10-CM | POA: Diagnosis present

## 2018-06-04 DIAGNOSIS — I200 Unstable angina: Secondary | ICD-10-CM | POA: Diagnosis not present

## 2018-06-04 DIAGNOSIS — E875 Hyperkalemia: Secondary | ICD-10-CM | POA: Diagnosis present

## 2018-06-04 DIAGNOSIS — M4802 Spinal stenosis, cervical region: Secondary | ICD-10-CM | POA: Diagnosis present

## 2018-06-04 DIAGNOSIS — Z8249 Family history of ischemic heart disease and other diseases of the circulatory system: Secondary | ICD-10-CM | POA: Diagnosis not present

## 2018-06-04 DIAGNOSIS — I429 Cardiomyopathy, unspecified: Secondary | ICD-10-CM | POA: Diagnosis not present

## 2018-06-04 DIAGNOSIS — Z981 Arthrodesis status: Secondary | ICD-10-CM | POA: Diagnosis not present

## 2018-06-04 DIAGNOSIS — R269 Unspecified abnormalities of gait and mobility: Secondary | ICD-10-CM | POA: Diagnosis not present

## 2018-06-04 DIAGNOSIS — E041 Nontoxic single thyroid nodule: Secondary | ICD-10-CM | POA: Diagnosis present

## 2018-06-04 DIAGNOSIS — M5412 Radiculopathy, cervical region: Secondary | ICD-10-CM | POA: Diagnosis present

## 2018-06-04 DIAGNOSIS — I63233 Cerebral infarction due to unspecified occlusion or stenosis of bilateral carotid arteries: Secondary | ICD-10-CM | POA: Diagnosis not present

## 2018-06-04 DIAGNOSIS — G959 Disease of spinal cord, unspecified: Secondary | ICD-10-CM | POA: Diagnosis not present

## 2018-06-04 DIAGNOSIS — I5021 Acute systolic (congestive) heart failure: Secondary | ICD-10-CM | POA: Diagnosis not present

## 2018-06-04 DIAGNOSIS — I10 Essential (primary) hypertension: Secondary | ICD-10-CM | POA: Diagnosis not present

## 2018-06-04 DIAGNOSIS — R52 Pain, unspecified: Secondary | ICD-10-CM | POA: Diagnosis not present

## 2018-06-04 DIAGNOSIS — I6523 Occlusion and stenosis of bilateral carotid arteries: Secondary | ICD-10-CM | POA: Diagnosis not present

## 2018-06-04 DIAGNOSIS — J9601 Acute respiratory failure with hypoxia: Secondary | ICD-10-CM | POA: Diagnosis not present

## 2018-06-04 DIAGNOSIS — D649 Anemia, unspecified: Secondary | ICD-10-CM | POA: Diagnosis present

## 2018-06-04 DIAGNOSIS — R4701 Aphasia: Secondary | ICD-10-CM | POA: Diagnosis not present

## 2018-06-04 DIAGNOSIS — Z79899 Other long term (current) drug therapy: Secondary | ICD-10-CM | POA: Diagnosis not present

## 2018-06-04 DIAGNOSIS — R29711 NIHSS score 11: Secondary | ICD-10-CM | POA: Diagnosis not present

## 2018-06-04 DIAGNOSIS — I6389 Other cerebral infarction: Secondary | ICD-10-CM | POA: Diagnosis not present

## 2018-06-04 DIAGNOSIS — I25110 Atherosclerotic heart disease of native coronary artery with unstable angina pectoris: Secondary | ICD-10-CM | POA: Diagnosis not present

## 2018-06-04 DIAGNOSIS — M25512 Pain in left shoulder: Secondary | ICD-10-CM | POA: Diagnosis present

## 2018-06-04 DIAGNOSIS — I129 Hypertensive chronic kidney disease with stage 1 through stage 4 chronic kidney disease, or unspecified chronic kidney disease: Secondary | ICD-10-CM | POA: Diagnosis present

## 2018-06-04 DIAGNOSIS — I2510 Atherosclerotic heart disease of native coronary artery without angina pectoris: Secondary | ICD-10-CM | POA: Diagnosis present

## 2018-06-04 DIAGNOSIS — I130 Hypertensive heart and chronic kidney disease with heart failure and stage 1 through stage 4 chronic kidney disease, or unspecified chronic kidney disease: Secondary | ICD-10-CM | POA: Diagnosis present

## 2018-06-04 DIAGNOSIS — E785 Hyperlipidemia, unspecified: Secondary | ICD-10-CM | POA: Diagnosis present

## 2018-06-04 DIAGNOSIS — F17220 Nicotine dependence, chewing tobacco, uncomplicated: Secondary | ICD-10-CM | POA: Diagnosis present

## 2018-06-04 DIAGNOSIS — I25119 Atherosclerotic heart disease of native coronary artery with unspecified angina pectoris: Secondary | ICD-10-CM | POA: Diagnosis not present

## 2018-06-04 DIAGNOSIS — R2689 Other abnormalities of gait and mobility: Secondary | ICD-10-CM | POA: Diagnosis present

## 2018-06-04 DIAGNOSIS — G9341 Metabolic encephalopathy: Secondary | ICD-10-CM | POA: Diagnosis not present

## 2018-06-04 DIAGNOSIS — R4182 Altered mental status, unspecified: Secondary | ICD-10-CM | POA: Diagnosis not present

## 2018-06-04 DIAGNOSIS — I440 Atrioventricular block, first degree: Secondary | ICD-10-CM | POA: Diagnosis present

## 2018-06-04 DIAGNOSIS — G9340 Encephalopathy, unspecified: Secondary | ICD-10-CM | POA: Diagnosis not present

## 2018-06-04 DIAGNOSIS — N170 Acute kidney failure with tubular necrosis: Secondary | ICD-10-CM | POA: Diagnosis not present

## 2018-06-04 DIAGNOSIS — G992 Myelopathy in diseases classified elsewhere: Secondary | ICD-10-CM | POA: Diagnosis present

## 2018-06-04 DIAGNOSIS — M4712 Other spondylosis with myelopathy, cervical region: Secondary | ICD-10-CM | POA: Diagnosis not present

## 2018-06-04 DIAGNOSIS — R4189 Other symptoms and signs involving cognitive functions and awareness: Secondary | ICD-10-CM | POA: Diagnosis present

## 2018-06-04 DIAGNOSIS — N183 Chronic kidney disease, stage 3 (moderate): Secondary | ICD-10-CM | POA: Diagnosis present

## 2018-06-04 DIAGNOSIS — R072 Precordial pain: Secondary | ICD-10-CM | POA: Diagnosis not present

## 2018-06-04 LAB — CBC
HEMATOCRIT: 32.5 % — AB (ref 39.0–52.0)
Hemoglobin: 10.6 g/dL — ABNORMAL LOW (ref 13.0–17.0)
MCH: 29.8 pg (ref 26.0–34.0)
MCHC: 32.6 g/dL (ref 30.0–36.0)
MCV: 91.3 fL (ref 78.0–100.0)
PLATELETS: 239 10*3/uL (ref 150–400)
RBC: 3.56 MIL/uL — ABNORMAL LOW (ref 4.22–5.81)
RDW: 12 % (ref 11.5–15.5)
WBC: 11.7 10*3/uL — AB (ref 4.0–10.5)

## 2018-06-04 LAB — BASIC METABOLIC PANEL
ANION GAP: 7 (ref 5–15)
BUN: 36 mg/dL — ABNORMAL HIGH (ref 8–23)
CALCIUM: 8.3 mg/dL — AB (ref 8.9–10.3)
CO2: 26 mmol/L (ref 22–32)
CREATININE: 1.83 mg/dL — AB (ref 0.61–1.24)
Chloride: 102 mmol/L (ref 98–111)
GFR, EST AFRICAN AMERICAN: 41 mL/min — AB (ref >60)
GFR, EST NON AFRICAN AMERICAN: 35 mL/min — AB (ref >60)
GLUCOSE: 160 mg/dL — AB (ref 70–99)
Potassium: 5.2 mmol/L — ABNORMAL HIGH (ref 3.5–5.1)
Sodium: 135 mmol/L (ref 135–145)

## 2018-06-04 LAB — TSH: TSH: 3.204 u[IU]/mL (ref 0.350–4.500)

## 2018-06-04 LAB — NM MYOCAR MULTI W/SPECT W/WALL MOTION / EF
CHL CUP MPHR: 147 {beats}/min
CHL CUP RESTING HR STRESS: 55 {beats}/min
CSEPED: 5 min
CSEPEW: 1 METS
Exercise duration (sec): 17 s
Peak HR: 81 {beats}/min
Percent HR: 55 %

## 2018-06-04 MED ORDER — IOPAMIDOL (ISOVUE-370) INJECTION 76%: 100.0000 mL | Freq: Once | INTRAVENOUS | Status: DC

## 2018-06-04 MED ORDER — PATIROMER SORBITEX CALCIUM 8.4 G PO PACK
16.8000 g | PACK | Freq: Every day | ORAL | Status: DC
  Administered 2018-06-04: 16.8 g via ORAL
  Filled 2018-06-04 (×3): qty 2

## 2018-06-04 MED ORDER — TECHNETIUM TC 99M TETROFOSMIN IV KIT
30.0000 | PACK | Freq: Once | INTRAVENOUS | Status: AC | PRN
  Administered 2018-06-04: 30 via INTRAVENOUS

## 2018-06-04 MED ORDER — TECHNETIUM TC 99M TETROFOSMIN IV KIT
10.0000 | PACK | Freq: Once | INTRAVENOUS | Status: AC | PRN
  Administered 2018-06-04: 10 via INTRAVENOUS

## 2018-06-04 MED ORDER — REGADENOSON 0.4 MG/5ML IV SOLN
INTRAVENOUS | Status: AC
  Filled 2018-06-04: qty 5

## 2018-06-04 MED ORDER — REGADENOSON 0.4 MG/5ML IV SOLN
0.4000 mg | Freq: Once | INTRAVENOUS | Status: AC
  Administered 2018-06-04: 0.4 mg via INTRAVENOUS

## 2018-06-04 NOTE — Progress Notes (Signed)
Occupational Therapy Treatment Patient Details Name: Ian Ramos MRN: [id number] DOB: Apr 01, 1946 Today's Date: 06/04/2018    History of present illness Pt is a 72 y/o male admitted secondary to intractable chest and L shoulder pain. Neurosurgery feels this is radicular irritation of his left-sided C5 nerve root and started pt on steroids and neurontin. Pt with recent cervical fusion. Imaging negative for PE and showed probable esophagitis. Imaging of C spine showed post op fluid collection. PMH includes gout and recent Cervical fusion.    OT comments  Pt frustrated being NPO and awaiting another test. Also asking when his cervical dressing will be changed. RN aware. Pt ambulated with min to min guard assist and performed UB dressing, toileting and one grooming activity standing at the sink.   Follow Up Recommendations  CIR;Supervision/Assistance - 24 hour    Equipment Recommendations  3 in 1 bedside commode    Recommendations for Other Services      Precautions / Restrictions Precautions Precautions: Fall;Cervical Precaution Comments: reviewed cervical precautions Required Braces or Orthoses: Cervical Brace Cervical Brace: Soft collar;At all times Restrictions Weight Bearing Restrictions: No       Mobility Bed Mobility Overal bed mobility: Needs Assistance Bed Mobility: Rolling;Sidelying to Sit Rolling: Min assist Sidelying to sit: Min assist       General bed mobility comments: cues for technique, use of pad to assist with rolling, assist to raise trunk  Transfers   Equipment used: Rolling walker (2 wheeled) Transfers: Sit to/from Stand Sit to Stand: Min guard         General transfer comment: for safety    Balance Overall balance assessment: Needs assistance   Sitting balance-Leahy Scale: Fair       Standing balance-Leahy Scale: Poor Standing balance comment: walker and min guard for static standing                           ADL either  performed or assessed with clinical judgement   ADL Overall ADL's : Needs assistance/impaired     Grooming: Min guard;Standing;Wash/dry hands           Upper Body Dressing : Moderate assistance;Sitting       Toilet Transfer: Minimal assistance;Ambulation;RW Toilet Transfer Details (indicate cue type and reason): stood to urinate Toileting- Clothing Manipulation and Hygiene: Minimal assistance(standing) Toileting - Clothing Manipulation Details (indicate cue type and reason): Min A for managing clothing while urinating standing at toilet.      Functional mobility during ADLs: Min guard;Rolling walker;Cueing for safety(min without walker to walk around bed)       Vision       Perception     Praxis      Cognition Arousal/Alertness: Awake/alert Behavior During Therapy: Flat affect Overall Cognitive Status: Impaired/Different from baseline Area of Impairment: Problem solving                         Safety/Judgement: Decreased awareness of safety   Problem Solving: Difficulty sequencing;Requires verbal cues General Comments: pt voicing frustration about the mulitple tests and not being able to eat today        Exercises     Shoulder Instructions       General Comments      Pertinent Vitals/ Pain       Pain Assessment: Faces Faces Pain Scale: Hurts little more Pain Location: L shoulder  Pain Descriptors / Indicators: Grimacing Pain Intervention(s): Ice  applied;Repositioned;Monitored during session  Home Living                                          Prior Functioning/Environment              Frequency  Min 3X/week        Progress Toward Goals  OT Goals(current goals can now be found in the care plan section)  Progress towards OT goals: Progressing toward goals  Acute Rehab OT Goals Patient Stated Goal: to go home OT Goal Formulation: With patient/family Time For Goal Achievement: 06/14/18 Potential to Achieve  Goals: Good  Plan Discharge plan remains appropriate    Co-evaluation                 AM-PAC PT "6 Clicks" Daily Activity     Outcome Measure   Help from another person eating meals?: A Little Help from another person taking care of personal grooming?: A Little Help from another person toileting, which includes using toliet, bedpan, or urinal?: A Little Help from another person bathing (including washing, rinsing, drying)?: A Lot Help from another person to put on and taking off regular upper body clothing?: A Little Help from another person to put on and taking off regular lower body clothing?: A Lot 6 Click Score: 16    End of Session Equipment Utilized During Treatment: Cervical collar;Rolling walker;Gait belt  OT Visit Diagnosis: Unsteadiness on feet (R26.81);Other abnormalities of gait and mobility (R26.89);Pain;Muscle weakness (generalized) (M62.81)   Activity Tolerance Patient tolerated treatment well   Patient Left in chair;with call bell/phone within reach;with chair alarm set;with family/visitor present   Nurse Communication Other (comment)(wants cervical bandage changed)        Time: 1610-9604 OT Time Calculation (min): 34 min  Charges: OT General Charges $OT Visit: 1 Visit OT Treatments $Self Care/Home Management : 23-37 mins  06/04/2018 Martie Round, OTR/L Pager: [phone]   Iran Planas [place] Bailiff 06/04/2018, 9:58 AM

## 2018-06-04 NOTE — Progress Notes (Signed)
Progress Note  Patient Name: Ian Ramos Date of Encounter: 06/04/2018  Primary Cardiologist: No primary care provider on file.   Subjective   Denies any chest pain or shoulder pain  Inpatient Medications    Scheduled Meds: . aspirin EC  81 mg Oral Daily  . gabapentin  300 mg Oral TID  . heparin injection (subcutaneous)  5,000 Units Subcutaneous Q8H  . iopamidol  100 mL Intravenous Once  . losartan  25 mg Oral Daily  . methylPREDNISolone  4 mg Oral 4X daily taper  . metoprolol tartrate  50 mg Oral Once  . pantoprazole  40 mg Oral BID  . patiromer  16.8 g Oral Daily  . polyethylene glycol  17 g Oral BID  . senna-docusate  2 tablet Oral BID   Continuous Infusions: . sodium chloride 75 mL/hr at 06/04/18 0400   PRN Meds: acetaminophen, ALPRAZolam, bisacodyl, gi cocktail, morphine injection, nitroGLYCERIN, ondansetron (ZOFRAN) IV, oxyCODONE-acetaminophen, senna, zolpidem   Vital Signs    Vitals:   06/03/18 1602 06/03/18 2017 06/03/18 2355 06/04/18 0350  BP: 125/71 135/73 115/74 123/62  Pulse: 67  64 60  Resp: 13 16 18 13   Temp: (!) 97.3 F (36.3 C) 98.3 F (36.8 C) 98 F (36.7 C) 98 F (36.7 C)  TempSrc: Oral Oral Oral Oral  SpO2: 96% 97% 96% 98%  Weight:      Height:        Intake/Output Summary (Last 24 hours) at 06/04/2018 0922 Last data filed at 06/04/2018 0400 Gross per 24 hour  Intake 1095.07 ml  Output 1200 ml  Net -104.93 ml   Filed Weights   05/28/18 1035 05/28/18 2056 05/31/18 0425  Weight: 79.4 kg 76.1 kg 73.4 kg    Telemetry    NSR - Personally Reviewed  ECG    No new EKG to review - Personally Reviewed  Physical Exam   GEN: No acute distress.   Neck: No JVD Cardiac: RRR, no murmurs, rubs, or gallops.  Respiratory: Clear to auscultation bilaterally. GI: Soft, nontender, non-distended  MS: No edema; No deformity. Neuro:  Nonfocal  Psych: Normal affect   Labs    Chemistry Recent Labs  Lab 05/29/18 1101 06/01/18 1057  06/04/18 0340  NA 136 135 135  K 4.7 4.9 5.2*  CL 104 103 102  CO2 24 23 26   GLUCOSE 133* 155* 160*  BUN 27* 33* 36*  CREATININE 1.42* 1.50* 1.83*  CALCIUM 8.2* 8.3* 8.3*  GFRNONAA 47* 44* 35*  GFRAA 55* 52* 41*  ANIONGAP 8 9 7      Hematology Recent Labs  Lab 05/28/18 1037 05/30/18 0319 06/04/18 0340  WBC 10.5 7.9 11.7*  RBC 3.51* 3.33*  3.33* 3.56*  HGB 10.3* 9.7* 10.6*  HCT 31.4* 29.9* 32.5*  MCV 89.5 89.8 91.3  MCH 29.3 29.1 29.8  MCHC 32.8 32.4 32.6  RDW 11.7 11.9 12.0  PLT 224 231 239    Cardiac Enzymes Recent Labs  Lab 05/28/18 2207 05/29/18 1548  TROPONINI <0.03 <0.03    Recent Labs  Lab 05/28/18 1111 05/28/18 1450  TROPIPOC 0.04 0.00     BNPNo results for input(s): BNP, PROBNP in the last 168 hours.   DDimer No results for input(s): DDIMER in the last 168 hours.   Radiology    No results found.  Cardiac Studies   2D echo 05/29/2018 Study Conclusions  - Left ventricle: The cavity size was normal. Systolic function was   moderately reduced. The estimated ejection fraction  was in the   range of 35% to 40%. Diffuse hypokinesis. Doppler parameters are   consistent with abnormal left ventricular relaxation (grade 1   diastolic dysfunction). Doppler parameters are consistent with   elevated ventricular end-diastolic filling pressure. - Aortic valve: A bicuspid morphology cannot be excluded; severely   thickened, severely calcified leaflets. There was trivial   regurgitation. - Mitral valve: There was mild regurgitation. - Left atrium: The atrium was normal in size. - Right atrium: The atrium was normal in size. - Tricuspid valve: There was trivial regurgitation. - Pulmonary arteries: Systolic pressure was within the normal   range. - Inferior vena cava: The vessel was dilated. The respirophasic   diameter changes were blunted (< 50%), consistent with elevated   central venous pressure. - Pericardium, extracardiac: There was no pericardial  effusion.  Patient Profile     72 y.o. male with a history of chronic kidney disease stage III and cervical disc disease who underwent recent cervical laminectomy on 05/23/2018 without any complications.  On 05/28/2018 he awakened at 4 AM with severe substernal chest pain and tried to sit up the pain became more severe.  There were no associated symptoms of shortness of breath, diaphoresis or nausea.  He called EMS and was given aspirin and chest pain resolved but he continued to have left shoulder pain although he says that he has intermittent shoulder pain chronically.  Assessment & Plan    1.  Chest pain along with left shoulder pain.  Difficult to determine whether this was due to recent cervical spine surgery versus GERD and esophageal spasm or due to coronary ischemia. -He has a left bundle branch block on EKG but no old EKG to compare to -Troponin less than 0.032 and POC troponin 0 -2D echo showed reduced LV function with EF 35% and diffuse hypokinesis -Would avoid anticoagulation given recent cervical spine surgery -initially plan was  for coronary CTA with FFR today to define coronary anatomy but creatinine bumped to 1.83 so will cancel and get [employer] -If he has significant ischemia with high risk scan, he will need cardiac catheterization and will need to get neurosurgery clearance for anticoagulation  2.  Dilated cardiomyopathy -2D echocardiogram shows new moderate LV dysfunction with EF 35% and diffuse hypokinesis of unknown etiology -stop ARB due to worsening renal function with creatinine 1.83 -No beta-blocker due to intermittent bradycardia on telemetry -He has no evidence of volume overload on exam  3.  Left bundle branch block -No old EKG to compare -Work-up as stated above with coronary CTA     For questions or updates, please contact [facility] Please consult [email or web address] for contact info under Cardiology/STEMI.      Signed, Armanda Magic, MD    06/04/2018, 9:22 AM

## 2018-06-04 NOTE — Progress Notes (Signed)
   Ian Ramos presented for a nuclear stress test today.  No immediate complications.  Stress imaging is pending at this time.  Preliminary EKG findings may be listed in the chart, but the stress test result will not be finalized until perfusion imaging is complete.  1 day study, [facility] to read.  Theodore Demark, PA-C 06/04/2018, 12:34 PM

## 2018-06-04 NOTE — Progress Notes (Signed)
Orthopedic Tech Progress Note Patient Details:  Ian Ramos 08-27-1946 [id number]  Ortho Devices Type of Ortho Device: Soft collar Ortho Device/Splint Location: neck Ortho Device/Splint Interventions: Application   Post Interventions Patient Tolerated: Well Instructions Provided: Care of device   Nikki Dom 06/04/2018, 6:13 PM

## 2018-06-04 NOTE — Progress Notes (Signed)
PROGRESS NOTE  SUCCESS DALLEY  EFE:[id number] DOB: 03-04-1946 DOA: 05/28/2018 PCP: Mliss Sax, MD   Brief Narrative: [name] medical history significant forchronic kidney disease stage III and cervical stenosis with myelopathy status post cervical laminectomy on 05/23/2018, now presenting to the emergency department for evaluation of chest pain. the chest pain is substernal, associated with change in the position, worse when sitting up and better laying down. He also reports left shoulder pain and unable to lift the left shoulder from pain.  Work-up significant for left bundle branch block, no old EKG to compare, and 2D echo with EF 35% with global hypokinesis.  Assessment & Plan: Principal Problem:   Intractable pain Active Problems:   Cervical myelopathy (HCC)   Chest pain   CKD (chronic kidney disease), stage III (HCC)   Esophageal thickening   LBBB (left bundle branch block)   Postoperative anemia   DCM (dilated cardiomyopathy) (HCC)  Atypical chest pain: With left shoulder pain, possibly related to esophageal reflux/spasm and/or MSK with recent cervical spine surgery vs. cardiac ischemia with LBBB and unknown chronicity. Troponins negative, echo w/EF 35-40%, diffuse hypokinesis, also has evidence of esophageal dysmotility on barium study.  - Cardiology to read stress testing, decide on further work up.   Dilated cardiomyopathy: Clinically euvolemic.  - Would benefit from guideline-directed therapy, but Cr up so ARB stopped (just transitioned to losartan). Bradycardia limiting beta blocker therapy.  - No need for diuretic at this time.  Esophagitis/esophageal dysmotility: CTA shows circumferential thickening of the esophagus. GI consulted for recommendations, underwent barium swallow study showing moderate dysmotility.  - PPI per GI, will need outpatient GI f/u for screening colo   Cervical myelopathy: s/p laminectomy on 8/23. -  Continue cervical collar.  - Plan dispo to CIR.  - Neurosurgery ordered neurontin and steroid course.  Stage 3 CKD: - Outpatient follow up with PCP.  - Monitor BMP  Hyperkalemia: Most likely due to ARB initiation.  - Stop ARB - Give patiromer - Continue telemetry - Recheck in AM.  Mild normocytic anemia: Hemoglobin stable around 10.  - Monitor  Constipation: Relieved 9/3 - Continue bowel program  DVT prophylaxis: Heparin subcutaneous. If needs full anticoagulation, would need to discuss with neurosurgery. Code Status: Full Family Communication: Wife at bedside Disposition Plan: CIR once work up completed.  Consultants:   PM&R  Cardiology  Procedures:  2D echo 05/29/2018 Study Conclusions  - Left ventricle: The cavity size was normal. Systolic function was moderately reduced. The estimated ejection fraction was in the range of 35% to 40%. Diffuse hypokinesis. Doppler parameters are consistent with abnormal left ventricular relaxation (grade 1 diastolic dysfunction). Doppler parameters are consistent with elevated ventricular end-diastolic filling pressure. - Aortic valve: A bicuspid morphology cannot be excluded; severely thickened, severely calcified leaflets. There was trivial regurgitation. - Mitral valve: There was mild regurgitation. - Left atrium: The atrium was normal in size. - Right atrium: The atrium was normal in size. - Tricuspid valve: There was trivial regurgitation. - Pulmonary arteries: Systolic pressure was within the normal range. - Inferior vena cava: The vessel was dilated. The respirophasic diameter changes were blunted (<50%), consistent with elevated central venous pressure. - Pericardium, extracardiac: There was no pericardial effusion.  Antimicrobials:  None   Subjective: Left shoulder pain remains, no dyspnea or leg swelling or orthopnea. Frustrated with duration of hospital stay. No new complaints. 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   Objective: Vitals:  06/03/18 2355 06/04/18 0350 06/04/18 1210 06/04/18 1211  BP: 115/74 123/62 (!) 142/68 134/69  Pulse: 64 60 81 (!) 121  Resp: 18 13    Temp: 98 F (36.7 C) 98 F (36.7 C)    TempSrc: Oral Oral    SpO2: 96% 98%    Weight:      Height:        Intake/Output Summary (Last 24 hours) at 06/04/2018 1317 Last data filed at 06/04/2018 0400 Gross per 24 hour  Intake 855.07 ml  Output 1200 ml  Net -344.93 ml   Filed Weights   05/28/18 1035 05/28/18 2056 05/31/18 0425  Weight: 79.4 kg 76.1 kg 73.4 kg    Gen: 72 y.o. male in no distress Neck: With cervical collar in place. Posterior wound without exudate. Pulm: Non-labored breathing. Clear to auscultation bilaterally.  CV: Regular rate and rhythm. No murmur, rub, or gallop. No JVD, no pedal edema. GI: Abdomen soft, non-tender, non-distended, with normoactive bowel sounds. No organomegaly or masses felt. Ext: Warm, no deformities Skin: No rashes, lesions ulcers Neuro: Alert and oriented. No new focal neurological deficits. Neck and Left UE ROM limited by pain.  Psych: Judgement and insight appear normal. Mood & affect appropriate.   Data Reviewed: I have personally reviewed following labs and imaging studies  CBC: Recent Labs  Lab 05/30/18 0319 06/04/18 0340  WBC 7.9 11.7*  HGB 9.7* 10.6*  HCT 29.9* 32.5*  MCV 89.8 91.3  PLT 231 239   Basic Metabolic Panel: Recent Labs  Lab 05/29/18 1101 06/01/18 1057 06/04/18 0340  NA 136 135 135  K 4.7 4.9 5.2*  CL 104 103 102  CO2 24 23 26   GLUCOSE 133* 155* 160*  BUN 27* 33* 36*  CREATININE 1.42* 1.50* 1.83*  CALCIUM 8.2* 8.3* 8.3*   GFR: Estimated Creatinine Clearance: 34.2 mL/min (A) (by C-G formula based on SCr of 1.83 mg/dL (H)). Liver Function Tests: No results for input(s): AST, ALT, ALKPHOS, BILITOT, PROT, ALBUMIN in the last 168 hours. No results for input(s): LIPASE, AMYLASE in the last 168  hours. No results for input(s): AMMONIA in the last 168 hours. Coagulation Profile: No results for input(s): INR, PROTIME in the last 168 hours. Cardiac Enzymes: Recent Labs  Lab 05/28/18 2207 05/29/18 1548  TROPONINI <0.03 <0.03   BNP (last 3 results) No results for input(s): PROBNP in the last 8760 hours. HbA1C: No results for input(s): HGBA1C in the last 72 hours. CBG: No results for input(s): GLUCAP in the last 168 hours. Lipid Profile: No results for input(s): CHOL, HDL, LDLCALC, TRIG, CHOLHDL, LDLDIRECT in the last 72 hours. Thyroid Function Tests: Recent Labs    06/04/18 0340  TSH 3.204   Anemia Panel: No results for input(s): VITAMINB12, FOLATE, FERRITIN, TIBC, IRON, RETICCTPCT in the last 72 hours. Urine analysis:    Component Value Date/Time   COLORURINE STRAW (A) 05/26/2018 0724   APPEARANCEUR CLEAR 05/26/2018 0724   LABSPEC 1.013 05/26/2018 0724   PHURINE 6.0 05/26/2018 0724   GLUCOSEU NEGATIVE 05/26/2018 0724   HGBUR NEGATIVE 05/26/2018 0724   BILIRUBINUR NEGATIVE 05/26/2018 0724   KETONESUR NEGATIVE 05/26/2018 0724   PROTEINUR NEGATIVE 05/26/2018 0724   UROBILINOGEN 0.2 06/25/2011 1027   NITRITE NEGATIVE 05/26/2018 0724   LEUKOCYTESUR NEGATIVE 05/26/2018 0724   Recent Results (from the past 240 hour(s))  Urine culture     Status: None   Collection Time: 05/26/18  9:25 AM  Result Value Ref Range Status   Specimen Description URINE,  CLEAN CATCH  Final   Special Requests NONE  Final   Culture   Final    NO GROWTH Performed at [facility], [address] [place], [place] [address]    Report Status 05/27/2018 FINAL  Final      Radiology Studies: No results found.  Scheduled Meds: . aspirin EC  81 mg Oral Daily  . gabapentin  300 mg Oral TID  . heparin injection (subcutaneous)  5,000 Units Subcutaneous Q8H  . iopamidol  100 mL Intravenous Once  . methylPREDNISolone  4 mg Oral 4X daily taper  . pantoprazole  40 mg Oral BID  . patiromer   16.8 g Oral Daily  . polyethylene glycol  17 g Oral BID  . regadenoson      . senna-docusate  2 tablet Oral BID   Continuous Infusions: . sodium chloride 75 mL/hr at 06/04/18 0400     LOS: 0 days   Time spent: 25 minutes.  Tyrone Nine, MD [facility] [email or web address] Password [email or web address] 06/04/2018, 1:17 PM

## 2018-06-05 ENCOUNTER — Encounter (HOSPITAL_COMMUNITY)
Admission: EM | Disposition: A | Discharge status: Rehabilitation Facility | Payer: Self-pay | Source: Home / Self Care | Attending: Family Medicine

## 2018-06-05 DIAGNOSIS — I2510 Atherosclerotic heart disease of native coronary artery without angina pectoris: Principal | ICD-10-CM

## 2018-06-05 HISTORY — PX: LEFT HEART CATH AND CORONARY ANGIOGRAPHY: CATH118249

## 2018-06-05 LAB — BASIC METABOLIC PANEL
ANION GAP: 7 (ref 5–15)
BUN: 37 mg/dL — ABNORMAL HIGH (ref 8–23)
CALCIUM: 8 mg/dL — AB (ref 8.9–10.3)
CHLORIDE: 104 mmol/L (ref 98–111)
CO2: 24 mmol/L (ref 22–32)
Creatinine, Ser: 1.72 mg/dL — ABNORMAL HIGH (ref 0.61–1.24)
GFR calc non Af Amer: 38 mL/min — ABNORMAL LOW (ref >60)
GFR, EST AFRICAN AMERICAN: 44 mL/min — AB (ref >60)
Glucose, Bld: 135 mg/dL — ABNORMAL HIGH (ref 70–99)
Potassium: 4.9 mmol/L (ref 3.5–5.1)
Sodium: 135 mmol/L (ref 135–145)

## 2018-06-05 SURGERY — LEFT HEART CATH AND CORONARY ANGIOGRAPHY
Anesthesia: LOCAL

## 2018-06-05 MED ORDER — HEPARIN (PORCINE) IN NACL 1000-0.9 UT/500ML-% IV SOLN
INTRAVENOUS | Status: AC
  Filled 2018-06-05: qty 1000

## 2018-06-05 MED ORDER — LIDOCAINE HCL (PF) 1 % IJ SOLN
INTRAMUSCULAR | Status: DC | PRN
  Administered 2018-06-05: 2 mL via INTRADERMAL

## 2018-06-05 MED ORDER — VERAPAMIL HCL 2.5 MG/ML IV SOLN
INTRAVENOUS | Status: DC | PRN
  Administered 2018-06-05: 18:00:00 via INTRA_ARTERIAL

## 2018-06-05 MED ORDER — FENTANYL CITRATE (PF) 100 MCG/2ML IJ SOLN
INTRAMUSCULAR | Status: DC | PRN
  Administered 2018-06-05: 25 ug via INTRAVENOUS

## 2018-06-05 MED ORDER — FENTANYL CITRATE (PF) 100 MCG/2ML IJ SOLN
INTRAMUSCULAR | Status: AC
  Filled 2018-06-05: qty 2

## 2018-06-05 MED ORDER — LIDOCAINE HCL (PF) 1 % IJ SOLN
INTRAMUSCULAR | Status: AC
  Filled 2018-06-05: qty 30

## 2018-06-05 MED ORDER — SODIUM CHLORIDE 0.9 % WEIGHT BASED INFUSION
1.0000 mL/kg/h | INTRAVENOUS | Status: AC
  Administered 2018-06-05: 1 mL/kg/h via INTRAVENOUS

## 2018-06-05 MED ORDER — ENOXAPARIN SODIUM 40 MG/0.4ML ~~LOC~~ SOLN
40.0000 mg | SUBCUTANEOUS | Status: DC
  Administered 2018-06-06 – 2018-06-19 (×14): 40 mg via SUBCUTANEOUS
  Filled 2018-06-05 (×14): qty 0.4

## 2018-06-05 MED ORDER — SODIUM CHLORIDE 0.9% FLUSH: 3.0000 mL | INTRAVENOUS | Status: DC | PRN

## 2018-06-05 MED ORDER — SODIUM CHLORIDE 0.9% FLUSH
3.0000 mL | Freq: Two times a day (BID) | INTRAVENOUS | Status: DC
  Administered 2018-06-06 – 2018-06-18 (×14): 3 mL via INTRAVENOUS

## 2018-06-05 MED ORDER — ISOSORBIDE MONONITRATE ER 30 MG PO TB24
15.0000 mg | ORAL_TABLET | Freq: Every day | ORAL | Status: DC
  Administered 2018-06-06 – 2018-06-08 (×3): 15 mg via ORAL
  Filled 2018-06-05 (×3): qty 1

## 2018-06-05 MED ORDER — HEPARIN SODIUM (PORCINE) 1000 UNIT/ML IJ SOLN
INTRAMUSCULAR | Status: DC | PRN
  Administered 2018-06-05: 3500 [IU] via INTRAVENOUS

## 2018-06-05 MED ORDER — MIDAZOLAM HCL 2 MG/2ML IJ SOLN
INTRAMUSCULAR | Status: AC
  Filled 2018-06-05: qty 2

## 2018-06-05 MED ORDER — HYDRALAZINE HCL 10 MG PO TABS
10.0000 mg | ORAL_TABLET | Freq: Three times a day (TID) | ORAL | Status: DC
  Administered 2018-06-05 – 2018-06-06 (×3): 10 mg via ORAL
  Filled 2018-06-05 (×3): qty 1

## 2018-06-05 MED ORDER — HEPARIN SODIUM (PORCINE) 1000 UNIT/ML IJ SOLN
INTRAMUSCULAR | Status: AC
  Filled 2018-06-05: qty 1

## 2018-06-05 MED ORDER — IOHEXOL 350 MG/ML SOLN
INTRAVENOUS | Status: DC | PRN
  Administered 2018-06-05: 30 mL via INTRACARDIAC

## 2018-06-05 MED ORDER — SODIUM CHLORIDE 0.9 % IV SOLN
250.0000 mL | INTRAVENOUS | Status: DC | PRN
  Administered 2018-06-09: 1 mL via INTRAVENOUS

## 2018-06-05 MED ORDER — VERAPAMIL HCL 2.5 MG/ML IV SOLN
INTRAVENOUS | Status: AC
  Filled 2018-06-05: qty 2

## 2018-06-05 MED ORDER — HEPARIN (PORCINE) IN NACL 1000-0.9 UT/500ML-% IV SOLN
INTRAVENOUS | Status: DC | PRN
  Administered 2018-06-05 (×2): 500 mL

## 2018-06-05 MED ORDER — MIDAZOLAM HCL 2 MG/2ML IJ SOLN
INTRAMUSCULAR | Status: DC | PRN
  Administered 2018-06-05: 1 mg via INTRAVENOUS

## 2018-06-05 SURGICAL SUPPLY — 9 items
CATH 5FR JL3.5 JR4 ANG PIG MP (CATHETERS) ×2 IMPLANT
DEVICE RAD COMP TR BAND LRG (VASCULAR PRODUCTS) ×2 IMPLANT
GLIDESHEATH SLEND SS 6F .021 (SHEATH) ×2 IMPLANT
GUIDEWIRE INQWIRE 1.5J.035X260 (WIRE) ×1 IMPLANT
INQWIRE 1.5J .035X260CM (WIRE) ×2
KIT HEART LEFT (KITS) ×2 IMPLANT
PACK CARDIAC CATHETERIZATION (CUSTOM PROCEDURE TRAY) ×2 IMPLANT
TRANSDUCER W/STOPCOCK (MISCELLANEOUS) ×2 IMPLANT
TUBING CIL FLEX 10 FLL-RA (TUBING) ×2 IMPLANT

## 2018-06-05 NOTE — Progress Notes (Signed)
Inpatient Rehabilitation-Admissions Coordinator   [facility] has received insurance approval for admit to CIR; However, noted pt has plans for cardiac cath today.   Will continue to follow for medical readiness and bed availability.   Please call if questions.   [name], OTR/L  Rehab Admissions Coordinator  [phone] 06/05/2018 1:31 PM

## 2018-06-05 NOTE — H&P (View-Only) (Signed)
 Progress Note  Patient Name: Ian Ramos Date of Encounter: 06/05/2018  Primary Cardiologist: No primary care provider on file.   Subjective   Still having left shoulder pain  Inpatient Medications    Scheduled Meds: . aspirin EC  81 mg Oral Daily  . gabapentin  300 mg Oral TID  . heparin injection (subcutaneous)  5,000 Units Subcutaneous Q8H  . iopamidol  100 mL Intravenous Once  . pantoprazole  40 mg Oral BID  . patiromer  16.8 g Oral Daily  . senna-docusate  2 tablet Oral BID   Continuous Infusions: . sodium chloride 75 mL/hr at 06/05/18 0611   PRN Meds: acetaminophen, ALPRAZolam, bisacodyl, gi cocktail, morphine injection, nitroGLYCERIN, ondansetron (ZOFRAN) IV, oxyCODONE-acetaminophen, senna, zolpidem   Vital Signs    Vitals:   06/04/18 2322 06/05/18 0354 06/05/18 0909 06/05/18 1250  BP: 103/60 117/64 (!) 146/63 (!) 155/61  Pulse: 62 68 65 (!) 55  Resp:  13 18 11  Temp: 98.8 F (37.1 C) (!) 97.4 F (36.3 C) 98 F (36.7 C) 97.8 F (36.6 C)  TempSrc: Oral Oral Oral Oral  SpO2:  98% 98% 98%  Weight:      Height:        Intake/Output Summary (Last 24 hours) at 06/05/2018 1311 Last data filed at 06/05/2018 1251 Gross per 24 hour  Intake 1683.88 ml  Output 1475 ml  Net 208.88 ml   Filed Weights   05/28/18 1035 05/28/18 2056 05/31/18 0425  Weight: 79.4 kg 76.1 kg 73.4 kg    Telemetry    NSR - Personally Reviewed  ECG    No new EKG to review - Personally Reviewed  Physical Exam   GEN: No acute distress.   Neck: No JVD.  Neck brace on  Cardiac: RRR, no murmurs, rubs, or gallops.  Respiratory: Clear to auscultation bilaterally. GI: Soft, nontender, non-distended  MS: No edema; No deformity. Neuro:  Nonfocal  Psych: Normal affect   Labs    Chemistry Recent Labs  Lab 06/01/18 1057 06/04/18 0340 06/05/18 0349  NA 135 135 135  K 4.9 5.2* 4.9  CL 103 102 104  CO2 23 26 24  GLUCOSE 155* 160* 135*  BUN 33* 36* 37*  CREATININE 1.50*  1.83* 1.72*  CALCIUM 8.3* 8.3* 8.0*  GFRNONAA 44* 35* 38*  GFRAA 52* 41* 44*  ANIONGAP 9 7 7     Hematology Recent Labs  Lab 05/30/18 0319 06/04/18 0340  WBC 7.9 11.7*  RBC 3.33*  3.33* 3.56*  HGB 9.7* 10.6*  HCT 29.9* 32.5*  MCV 89.8 91.3  MCH 29.1 29.8  MCHC 32.4 32.6  RDW 11.9 12.0  PLT 231 239    Cardiac Enzymes Recent Labs  Lab 05/29/18 1548  TROPONINI <0.03   No results for input(s): TROPIPOC in the last 168 hours.   BNPNo results for input(s): BNP, PROBNP in the last 168 hours.   DDimer No results for input(s): DDIMER in the last 168 hours.   Radiology    Nm Myocar Multi W/spect W/wall Motion / Ef  Result Date: 06/04/2018  There was no ST segment deviation noted during stress.  Defect 1: There is a medium defect of moderate severity present in the basal anteroseptal and mid anteroseptal location.  Defect 2: There is a medium defect of severe severity present in the basal inferior and mid inferior location.  This is an intermediate risk study.  Findings consistent with ischemia.  Nuclear stress EF: 39%.  The left   ventricular ejection fraction is moderately decreased (30-44%).  Abnormal, intermediate risk stress nuclear study with predominantly fixed anteroseptal defect possibly secondary to left bundle branch block; mild to moderate inferior ischemia; gated ejection fraction 39% with hypokinesis of the septum, mild left ventricular enlargement.    Cardiac Studies   2D echo 05/29/2018 Study Conclusions  - Left ventricle: The cavity size was normal. Systolic function was moderately reduced. The estimated ejection fraction was in the range of 35% to 40%. Diffuse hypokinesis. Doppler parameters are consistent with abnormal left ventricular relaxation (grade 1 diastolic dysfunction). Doppler parameters are consistent with elevated ventricular end-diastolic filling pressure. - Aortic valve: A bicuspid morphology cannot be excluded;  severely thickened, severely calcified leaflets. There was trivial regurgitation. - Mitral valve: There was mild regurgitation. - Left atrium: The atrium was normal in size. - Right atrium: The atrium was normal in size. - Tricuspid valve: There was trivial regurgitation. - Pulmonary arteries: Systolic pressure was within the normal range. - Inferior vena cava: The vessel was dilated. The respirophasic diameter changes were blunted (<50%), consistent with elevated central venous pressure. - Pericardium, extracardiac: There was no pericardial effusion.  Nuclear stress test 06/04/2018  There was no ST segment deviation noted during stress.  Defect 1: There is a medium defect of moderate severity present in the basal anteroseptal and mid anteroseptal location.  Defect 2: There is a medium defect of severe severity present in the basal inferior and mid inferior location.  This is an intermediate risk study.  Findings consistent with ischemia.  Nuclear stress EF: 39%.  The left ventricular ejection fraction is moderately decreased (30-44%).   Abnormal, intermediate risk stress nuclear study with predominantly fixed anteroseptal defect possibly secondary to left bundle branch block; mild to moderate inferior ischemia; gated ejection fraction 39% with hypokinesis of the septum, mild left ventricular enlargement.  Patient Profile     72 y.o. male with a history of chronic kidney disease stage III and cervical disc disease who underwent recent cervical laminectomy on 05/23/2018 without any complications. On 05/28/2018 he awakened at 4 AM with severe substernal chest pain and tried to sit up the pain became more severe. There were no associated symptoms of shortness of breath, diaphoresis or nausea. He called EMS and was given aspirin and chest pain resolved but he continued to have left shoulder pain although he says that he has intermittent shoulder pain  chronically.   Assessment & Plan    1.  Chest pain along with left shoulder pain. Difficult to determine whether this was due to recent cervical spine surgery versus GERD and esophageal spasm or due to coronary ischemia. -He has a left bundle branch block on EKG but no old EKG to compare to -Troponin less than 0.032 and POC troponin 0 -2D echo showed reduced LV function with EF 35% and diffuse hypokinesis -Lexiscan myoview moderate risk with mild to moderate inferior wall ischemia and HK of the septum with fixed anteroseptal defect likely related to LBBB -plan for cardiac cath today - cors only due to increased creatinine -neurosurgery has cleared for DAPT if PCI needed  2. Dilated cardiomyopathy -2D echocardiogram shows new moderate LV dysfunction with EF 35% and diffuse hypokinesis of unknown etiology -ARB stopped due to worsening renal function with creatinine 1.83 yesterday and 1.72 today -No beta-blocker due to intermittent bradycardia on telemetry -He has no evidence of volume overload on exam -add Hydralazine 10mg TID and Imdur 15mg daily for LV dysfunction  3. Left bundle branch   block -No old EKG to compare -Work-up as stated above with coronary CTA       For questions or updates, please contact [facility] Please consult [email or web address] for contact info under Cardiology/STEMI.      Signed, Traci Turner, MD  06/05/2018, 1:11 PM   

## 2018-06-05 NOTE — Progress Notes (Signed)
PROGRESS NOTE  ISAI GOTTLIEB  WJX:[id number] DOB: 07/06/1946 DOA: 05/28/2018 PCP: Mliss Sax, MD   Brief Narrative: [name] medical history significant forchronic kidney disease stage III and cervical stenosis with myelopathy status post cervical laminectomy on 05/23/2018, now presenting to the emergency department for evaluation of chest pain. the chest pain is substernal, associated with change in the position, worse when sitting up and better laying down. He also reports left shoulder pain and unable to lift the left shoulder from pain.  Work-up significant for left bundle branch block, no old EKG to compare, and 2D echo with EF 35% with global hypokinesis. Subsequent stress testing showed intermediate risk, so cardiac catheterization is planned.   Assessment & Plan: Principal Problem:   Intractable pain Active Problems:   Cervical myelopathy (HCC)   Chest pain   CKD (chronic kidney disease), stage III (HCC)   Esophageal thickening   LBBB (left bundle branch block)   Postoperative anemia   DCM (dilated cardiomyopathy) (HCC)  Atypical chest pain: With left shoulder pain, possibly related to esophageal reflux/spasm and/or MSK with recent cervical spine surgery vs. cardiac ischemia with LBBB and unknown chronicity. Troponins negative, echo w/EF 35-40%, diffuse hypokinesis, also has evidence of esophageal dysmotility on barium study. Stress test 9/4 was intermediate risk.  - Catheterization per cardiology today. Cors only to minimize contrast exposure.  Dilated cardiomyopathy: Clinically euvolemic.  - Would benefit from guideline-directed therapy, but Cr up so ARB stopped (just transitioned to losartan). Bradycardia limiting beta blocker therapy.  - No need for diuretic at this time.  Esophagitis/esophageal dysmotility: CTA shows circumferential thickening of the esophagus. GI consulted for recommendations, underwent barium swallow study  showing moderate dysmotility.  - PPI per GI, will need outpatient GI f/u for screening colo   Cervical myelopathy: s/p laminectomy on 8/23. - Continue cervical collar.  - Plan dispo to CIR.  - Neurosurgery ordered neurontin and steroid course (has been completed).  AKI on stage 3 CKD: - Continue IV hydration post contrast exposure. Creatinine improved modestly, will monitor daily. - Stopped ARB - Outpatient follow up with PCP. - Monitor BMP  Hyperkalemia: Most likely due to ARB initiation.  - Stopped ARB - Gave patiromer with improvement, will repeat dose today given still high normal  - Continue telemetry - Recheck in AM.  Mild normocytic anemia: Hemoglobin stable around 10.  - Monitor  Constipation: Relieved 9/3 - Continue bowel program  DVT prophylaxis: Heparin subcutaneous. Code Status: Full Family Communication: None at bedside Disposition Plan: CIR once work up completed. Pending results of catheterization may be clear tomorrow.  Consultants:   PM&R  Cardiology  Procedures:  2D echo 05/29/2018 Study Conclusions  - Left ventricle: The cavity size was normal. Systolic function was moderately reduced. The estimated ejection fraction was in the range of 35% to 40%. Diffuse hypokinesis. Doppler parameters are consistent with abnormal left ventricular relaxation (grade 1 diastolic dysfunction). Doppler parameters are consistent with elevated ventricular end-diastolic filling pressure. - Aortic valve: A bicuspid morphology cannot be excluded; severely thickened, severely calcified leaflets. There was trivial regurgitation. - Mitral valve: There was mild regurgitation. - Left atrium: The atrium was normal in size. - Right atrium: The atrium was normal in size. - Tricuspid valve: There was trivial regurgitation. - Pulmonary arteries: Systolic pressure was within the normal range. - Inferior vena cava: The vessel was dilated. The  respirophasic diameter changes were blunted (<50%), consistent with elevated central venous pressure. - Pericardium, extracardiac:  There was no pericardial effusion.  Antimicrobials:  None   Subjective: Unchanged left shoulder pain. No weakness or numbness. Denies chest pain, dyspnea.  Objective: Vitals:   06/04/18 2322 06/05/18 0354 06/05/18 0909 06/05/18 1250  BP: 103/60 117/64 (!) 146/63 (!) 155/61  Pulse: 62 68 65 (!) 55  Resp:  13 18 11   Temp: 98.8 F (37.1 C) (!) 97.4 F (36.3 C) 98 F (36.7 C) 97.8 F (36.6 C)  TempSrc: Oral Oral Oral Oral  SpO2:  98% 98% 98%  Weight:      Height:        Intake/Output Summary (Last 24 hours) at 06/05/2018 1351 Last data filed at 06/05/2018 1251 Gross per 24 hour  Intake 1683.88 ml  Output 1475 ml  Net 208.88 ml   Filed Weights   05/28/18 1035 05/28/18 2056 05/31/18 0425  Weight: 79.4 kg 76.1 kg 73.4 kg   Gen: 72 y.o. male in no distress Neck: Cervical collar Pulm: Nonlabored breathing room air. Clear. CV: Regular rate and rhythm. No murmur, rub, or gallop. No JVD, no dependent edema. GI: Abdomen soft, non-tender, non-distended, with normoactive bowel sounds.  Ext: Warm, no deformities Skin: No rashes, lesions or ulcers on visualized skin.  Neuro: Alert and oriented. No new focal neurological deficits. Psych: Judgement and insight appear fair. Mood euthymic & affect congruent. Behavior is appropriate.    Data Reviewed: I have personally reviewed following labs and imaging studies  CBC: Recent Labs  Lab 05/30/18 0319 06/04/18 0340  WBC 7.9 11.7*  HGB 9.7* 10.6*  HCT 29.9* 32.5*  MCV 89.8 91.3  PLT 231 239   Basic Metabolic Panel: Recent Labs  Lab 06/01/18 1057 06/04/18 0340 06/05/18 0349  NA 135 135 135  K 4.9 5.2* 4.9  CL 103 102 104  CO2 23 26 24   GLUCOSE 155* 160* 135*  BUN 33* 36* 37*  CREATININE 1.50* 1.83* 1.72*  CALCIUM 8.3* 8.3* 8.0*   GFR: Estimated Creatinine Clearance: 36.4 mL/min (A) (by  C-G formula based on SCr of 1.72 mg/dL (H)). Liver Function Tests: No results for input(s): AST, ALT, ALKPHOS, BILITOT, PROT, ALBUMIN in the last 168 hours. No results for input(s): LIPASE, AMYLASE in the last 168 hours. No results for input(s): AMMONIA in the last 168 hours. Coagulation Profile: No results for input(s): INR, PROTIME in the last 168 hours. Cardiac Enzymes: Recent Labs  Lab 05/29/18 1548  TROPONINI <0.03   BNP (last 3 results) No results for input(s): PROBNP in the last 8760 hours. HbA1C: No results for input(s): HGBA1C in the last 72 hours. CBG: No results for input(s): GLUCAP in the last 168 hours. Lipid Profile: No results for input(s): CHOL, HDL, LDLCALC, TRIG, CHOLHDL, LDLDIRECT in the last 72 hours. Thyroid Function Tests: Recent Labs    06/04/18 0340  TSH 3.204   Anemia Panel: No results for input(s): VITAMINB12, FOLATE, FERRITIN, TIBC, IRON, RETICCTPCT in the last 72 hours. Urine analysis:    Component Value Date/Time   COLORURINE STRAW (A) 05/26/2018 0724   APPEARANCEUR CLEAR 05/26/2018 0724   LABSPEC 1.013 05/26/2018 0724   PHURINE 6.0 05/26/2018 0724   GLUCOSEU NEGATIVE 05/26/2018 0724   HGBUR NEGATIVE 05/26/2018 0724   BILIRUBINUR NEGATIVE 05/26/2018 0724   KETONESUR NEGATIVE 05/26/2018 0724   PROTEINUR NEGATIVE 05/26/2018 0724   UROBILINOGEN 0.2 06/25/2011 1027   NITRITE NEGATIVE 05/26/2018 0724   LEUKOCYTESUR NEGATIVE 05/26/2018 0724   No results found for this or any previous visit (from the past 240 hour(s)).  Radiology Studies: Nm Myocar Multi W/spect W/wall Motion / Ef  Result Date: 06/04/2018  There was no ST segment deviation noted during stress.  Defect 1: There is a medium defect of moderate severity present in the basal anteroseptal and mid anteroseptal location.  Defect 2: There is a medium defect of severe severity present in the basal inferior and mid inferior location.  This is an intermediate risk study.  Findings  consistent with ischemia.  Nuclear stress EF: 39%.  The left ventricular ejection fraction is moderately decreased (30-44%).  Abnormal, intermediate risk stress nuclear study with predominantly fixed anteroseptal defect possibly secondary to left bundle branch block; mild to moderate inferior ischemia; gated ejection fraction 39% with hypokinesis of the septum, mild left ventricular enlargement.    Scheduled Meds: . aspirin EC  81 mg Oral Daily  . gabapentin  300 mg Oral TID  . heparin injection (subcutaneous)  5,000 Units Subcutaneous Q8H  . hydrALAZINE  10 mg Oral Q8H  . iopamidol  100 mL Intravenous Once  . isosorbide mononitrate  15 mg Oral Daily  . pantoprazole  40 mg Oral BID  . patiromer  16.8 g Oral Daily  . senna-docusate  2 tablet Oral BID   Continuous Infusions: . sodium chloride 75 mL/hr at 06/05/18 0611     LOS: 1 day   Time spent: 25 minutes.  Tyrone Nine, MD [facility] [email or web address] Password [email or web address] 06/05/2018, 1:51 PM

## 2018-06-05 NOTE — Progress Notes (Signed)
Physical Therapy Treatment Patient Details Name: Ian Ramos MRN: [id number] DOB: 05-01-1946 Today's Date: 06/05/2018    History of Present Illness Pt is a 72 y/o male admitted secondary to intractable chest and L shoulder pain. Neurosurgery feels this is radicular irritation of his left-sided C5 nerve root and started pt on steroids and neurontin. Pt with recent cervical fusion (05/23/18). Imaging negative for PE and showed probable esophagitis. Imaging of C spine showed post op fluid collection. PMH includes gout and recent Cervical fusion.    PT Comments    Pt slowly progressing with mobility. Initiated gait training without use of RW this session, as pt not using DME to amb at baseline. Pt reliant on intermittent HHA and consistent minA to maintain balance amb short distance; demonstrates significant fall risk with poor balance strategies and postural reactions. Remains limited by c/o significant L shoulder pain, although demonstrates poor insight to use RUE instead of LUE (I.e. Reaching to furniture for support). Also with poor awareness of current physical deficits and unsafe to return home in current condition. Continue to recommend intensive CIR-level therapies to maximize functional mobility and return to indep PLOF.    Follow Up Recommendations  CIR;Supervision for mobility/OOB     Equipment Recommendations  None recommended by PT    Recommendations for Other Services       Precautions / Restrictions Precautions Precautions: Fall;Cervical Required Braces or Orthoses: Cervical Brace Cervical Brace: Soft collar;At all times Restrictions Weight Bearing Restrictions: No    Mobility  Bed Mobility Overal bed mobility: Needs Assistance Bed Mobility: Rolling;Sidelying to Sit;Sit to Sidelying Rolling: Min guard Sidelying to sit: Mod assist     Sit to sidelying: Min guard General bed mobility comments: Cues for technique, reliance on bed rail; pt attempting to come into  sitting unsuccessfully multiple times, eventually requiring modA for trunk elevation. C/o L shoulder pain but poor insight to try rolling onto R-side instead  Transfers Overall transfer level: Needs assistance Equipment used: None;1 person hand held assist Transfers: Sit to/from Stand Sit to Stand: Min assist         General transfer comment: MinA to stand without RW requiring HHA to maintain balance  Ambulation/Gait Ambulation/Gait assistance: Min assist   Assistive device: None;1 person hand held assist Gait Pattern/deviations: Step-to pattern;Shuffle Gait velocity: Decreased Gait velocity interpretation: <1.8 ft/sec, indicate of risk for recurrent falls General Gait Details: Attempted ambulation without RW requiring intermittent HHA and minA to prevent LOB. Pt reaching for furniture support with LUE instead of using RUE HHA from therapist although c/o significant LUE pain; poor problem solving to use RUE instead requiring cues to correct. Very unsteady amb pattern   Stairs             Wheelchair Mobility    Modified Rankin (Stroke Patients Only)       Balance Overall balance assessment: Needs assistance Sitting-balance support: No upper extremity supported;Feet supported Sitting balance-Leahy Scale: Fair Sitting balance - Comments: Poor trunk control with 2x return to sidelying requiring modA to correct   Standing balance support: Single extremity supported Standing balance-Leahy Scale: Poor Standing balance comment: Reliant on UE support; unable to tolerate challenge                            Cognition Arousal/Alertness: Awake/alert Behavior During Therapy: Flat affect Overall Cognitive Status: Impaired/Different from baseline Area of Impairment: Problem solving;Awareness;Safety/judgement  Safety/Judgement: Decreased awareness of safety;Decreased awareness of deficits Awareness: Emergent Problem Solving:  Difficulty sequencing;Requires verbal cues General Comments: Pt easily distracted by pain. Poor insight into current condition. When asked about walking, pt states "I can't walk..." then at end of session when asked if safe to go home in current condition, pt stating, "I guess so"      Exercises      General Comments General comments (skin integrity, edema, etc.): HR up to 106 with amb      Pertinent Vitals/Pain Pain Assessment: Faces Faces Pain Scale: Hurts even more Pain Location: L shoulder  Pain Descriptors / Indicators: Grimacing;Guarding Pain Intervention(s): Monitored during session;Limited activity within patient's tolerance    Home Living                      Prior Function            PT Goals (current goals can now be found in the care plan section) Acute Rehab PT Goals Patient Stated Goal: to go home PT Goal Formulation: With patient Time For Goal Achievement: 06/13/18 Potential to Achieve Goals: Good Progress towards PT goals: Progressing toward goals    Frequency    Min 3X/week      PT Plan Current plan remains appropriate    Co-evaluation              AM-PAC PT "6 Clicks" Daily Activity  Outcome Measure  Difficulty turning over in bed (including adjusting bedclothes, sheets and blankets)?: A Little Difficulty moving from lying on back to sitting on the side of the bed? : Unable Difficulty sitting down on and standing up from a chair with arms (e.g., wheelchair, bedside commode, etc,.)?: Unable Help needed moving to and from a bed to chair (including a wheelchair)?: A Little Help needed walking in hospital room?: A Little Help needed climbing 3-5 steps with a railing? : A Lot 6 Click Score: 13    End of Session Equipment Utilized During Treatment: Gait belt;Cervical collar Activity Tolerance: Patient limited by pain Patient left: in bed;with call bell/phone within reach;with bed alarm set Nurse Communication: Mobility status PT  Visit Diagnosis: Pain;Unsteadiness on feet (R26.81);Muscle weakness (generalized) (M62.81);Difficulty in walking, not elsewhere classified (R26.2) Pain - Right/Left: Left Pain - part of body: Shoulder     Time: 3557-3220 PT Time Calculation (min) (ACUTE ONLY): 15 min  Charges:  $Gait Training: 8-22 mins                     Ina Homes, PT, DPT Acute Rehabilitation Services  Pager [phone] Office [phone]  Ian Ramos 06/05/2018, 8:51 AM

## 2018-06-05 NOTE — Interval H&P Note (Signed)
History and Physical Interval Note:  06/05/2018 5:20 PM  Ian Ramos  has presented today for surgery, with the diagnosis of cp  The various methods of treatment have been discussed with the patient and family. After consideration of risks, benefits and other options for treatment, the patient has consented to  Procedure(s): LEFT HEART CATH AND CORONARY ANGIOGRAPHY (N/A) as a surgical intervention .  The patient's history has been reviewed, patient examined, no change in status, stable for surgery.  I have reviewed the patient's chart and labs.  Questions were answered to the patient's satisfaction.   Cath Lab Visit (complete for each Cath Lab visit)  Clinical Evaluation Leading to the Procedure:   ACS: Yes.    Non-ACS:    Anginal Classification: CCS III  Anti-ischemic medical therapy: No Therapy  Non-Invasive Test Results: Intermediate-risk stress test findings: cardiac mortality 1-3%/year  Prior CABG: No previous CABG        Theron Arista [facility] 06/05/2018 5:20 PM

## 2018-06-05 NOTE — Progress Notes (Signed)
Spoke with Dr. Jordan Likes regarding cardiac catheterization planned for today. Anticoagulation ok with neurosurgery if needed.   Ian Chard NP-C [facility] Pager: [phone]

## 2018-06-05 NOTE — Progress Notes (Signed)
Progress Note  Patient Name: Ian Ramos Date of Encounter: 06/05/2018  Primary Cardiologist: No primary care provider on file.   Subjective   Still having left shoulder pain  Inpatient Medications    Scheduled Meds: . aspirin EC  81 mg Oral Daily  . gabapentin  300 mg Oral TID  . heparin injection (subcutaneous)  5,000 Units Subcutaneous Q8H  . iopamidol  100 mL Intravenous Once  . pantoprazole  40 mg Oral BID  . patiromer  16.8 g Oral Daily  . senna-docusate  2 tablet Oral BID   Continuous Infusions: . sodium chloride 75 mL/hr at 06/05/18 0611   PRN Meds: acetaminophen, ALPRAZolam, bisacodyl, gi cocktail, morphine injection, nitroGLYCERIN, ondansetron (ZOFRAN) IV, oxyCODONE-acetaminophen, senna, zolpidem   Vital Signs    Vitals:   06/04/18 2322 06/05/18 0354 06/05/18 0909 06/05/18 1250  BP: 103/60 117/64 (!) 146/63 (!) 155/61  Pulse: 62 68 65 (!) 55  Resp:  13 18 11   Temp: 98.8 F (37.1 C) (!) 97.4 F (36.3 C) 98 F (36.7 C) 97.8 F (36.6 C)  TempSrc: Oral Oral Oral Oral  SpO2:  98% 98% 98%  Weight:      Height:        Intake/Output Summary (Last 24 hours) at 06/05/2018 1311 Last data filed at 06/05/2018 1251 Gross per 24 hour  Intake 1683.88 ml  Output 1475 ml  Net 208.88 ml   Filed Weights   05/28/18 1035 05/28/18 2056 05/31/18 0425  Weight: 79.4 kg 76.1 kg 73.4 kg    Telemetry    NSR - Personally Reviewed  ECG    No new EKG to review - Personally Reviewed  Physical Exam   GEN: No acute distress.   Neck: No JVD.  Neck brace on  Cardiac: RRR, no murmurs, rubs, or gallops.  Respiratory: Clear to auscultation bilaterally. GI: Soft, nontender, non-distended  MS: No edema; No deformity. Neuro:  Nonfocal  Psych: Normal affect   Labs    Chemistry Recent Labs  Lab 06/01/18 1057 06/04/18 0340 06/05/18 0349  NA 135 135 135  K 4.9 5.2* 4.9  CL 103 102 104  CO2 23 26 24   GLUCOSE 155* 160* 135*  BUN 33* 36* 37*  CREATININE 1.50*  1.83* 1.72*  CALCIUM 8.3* 8.3* 8.0*  GFRNONAA 44* 35* 38*  GFRAA 52* 41* 44*  ANIONGAP 9 7 7      Hematology Recent Labs  Lab 05/30/18 0319 06/04/18 0340  WBC 7.9 11.7*  RBC 3.33*  3.33* 3.56*  HGB 9.7* 10.6*  HCT 29.9* 32.5*  MCV 89.8 91.3  MCH 29.1 29.8  MCHC 32.4 32.6  RDW 11.9 12.0  PLT 231 239    Cardiac Enzymes Recent Labs  Lab 05/29/18 1548  TROPONINI <0.03   No results for input(s): TROPIPOC in the last 168 hours.   BNPNo results for input(s): BNP, PROBNP in the last 168 hours.   DDimer No results for input(s): DDIMER in the last 168 hours.   Radiology    Nm Myocar Multi W/spect W/wall Motion / Ef  Result Date: 06/04/2018  There was no ST segment deviation noted during stress.  Defect 1: There is a medium defect of moderate severity present in the basal anteroseptal and mid anteroseptal location.  Defect 2: There is a medium defect of severe severity present in the basal inferior and mid inferior location.  This is an intermediate risk study.  Findings consistent with ischemia.  Nuclear stress EF: 39%.  The left  ventricular ejection fraction is moderately decreased (30-44%).  Abnormal, intermediate risk stress nuclear study with predominantly fixed anteroseptal defect possibly secondary to left bundle branch block; mild to moderate inferior ischemia; gated ejection fraction 39% with hypokinesis of the septum, mild left ventricular enlargement.    Cardiac Studies   2D echo 05/29/2018 Study Conclusions  - Left ventricle: The cavity size was normal. Systolic function was moderately reduced. The estimated ejection fraction was in the range of 35% to 40%. Diffuse hypokinesis. Doppler parameters are consistent with abnormal left ventricular relaxation (grade 1 diastolic dysfunction). Doppler parameters are consistent with elevated ventricular end-diastolic filling pressure. - Aortic valve: A bicuspid morphology cannot be excluded;  severely thickened, severely calcified leaflets. There was trivial regurgitation. - Mitral valve: There was mild regurgitation. - Left atrium: The atrium was normal in size. - Right atrium: The atrium was normal in size. - Tricuspid valve: There was trivial regurgitation. - Pulmonary arteries: Systolic pressure was within the normal range. - Inferior vena cava: The vessel was dilated. The respirophasic diameter changes were blunted (<50%), consistent with elevated central venous pressure. - Pericardium, extracardiac: There was no pericardial effusion.  Nuclear stress test 06/04/2018  There was no ST segment deviation noted during stress.  Defect 1: There is a medium defect of moderate severity present in the basal anteroseptal and mid anteroseptal location.  Defect 2: There is a medium defect of severe severity present in the basal inferior and mid inferior location.  This is an intermediate risk study.  Findings consistent with ischemia.  Nuclear stress EF: 39%.  The left ventricular ejection fraction is moderately decreased (30-44%).   Abnormal, intermediate risk stress nuclear study with predominantly fixed anteroseptal defect possibly secondary to left bundle branch block; mild to moderate inferior ischemia; gated ejection fraction 39% with hypokinesis of the septum, mild left ventricular enlargement.  Patient Profile     72 y.o. male with a history of chronic kidney disease stage III and cervical disc disease who underwent recent cervical laminectomy on 05/23/2018 without any complications. On 05/28/2018 he awakened at 4 AM with severe substernal chest pain and tried to sit up the pain became more severe. There were no associated symptoms of shortness of breath, diaphoresis or nausea. He called EMS and was given aspirin and chest pain resolved but he continued to have left shoulder pain although he says that he has intermittent shoulder pain  chronically.   Assessment & Plan    1.  Chest pain along with left shoulder pain. Difficult to determine whether this was due to recent cervical spine surgery versus GERD and esophageal spasm or due to coronary ischemia. -He has a left bundle branch block on EKG but no old EKG to compare to -Troponin less than 0.032 and POC troponin 0 -2D echo showed reduced LV function with EF 35% and diffuse hypokinesis -Lexiscan myoview moderate risk with mild to moderate inferior wall ischemia and HK of the septum with fixed anteroseptal defect likely related to LBBB -plan for cardiac cath today - cors only due to increased creatinine -neurosurgery has cleared for DAPT if PCI needed  2. Dilated cardiomyopathy -2D echocardiogram shows new moderate LV dysfunction with EF 35% and diffuse hypokinesis of unknown etiology -ARB stopped due to worsening renal function with creatinine 1.83 yesterday and 1.72 today -No beta-blocker due to intermittent bradycardia on telemetry -He has no evidence of volume overload on exam -add Hydralazine 10mg  TID and Imdur 15mg  daily for LV dysfunction  3. Left bundle branch  block -No old EKG to compare -Work-up as stated above with coronary CTA       For questions or updates, please contact [facility] Please consult [email or web address] for contact info under Cardiology/STEMI.      Signed, Armanda Magic, MD  06/05/2018, 1:11 PM

## 2018-06-05 NOTE — Progress Notes (Signed)
Overall patient looks good.  Minimal neck pain.  Still with some left shoulder pain.  Left shoulder strength remains much improved from both preop and immediately postop.  Much better strength sensation in both hands.  Ambulating without difficulty.  Wound healing well.  Overall progressing reasonably well following multilevel posterior cervical decompression and fusion.  Continue efforts at mobilization.  Okay for home discharge once cardiac situation finalized.

## 2018-06-06 ENCOUNTER — Inpatient Hospital Stay (HOSPITAL_COMMUNITY): Payer: BLUE CROSS/BLUE SHIELD

## 2018-06-06 ENCOUNTER — Encounter (HOSPITAL_COMMUNITY): Payer: Self-pay | Admitting: Cardiology

## 2018-06-06 DIAGNOSIS — I200 Unstable angina: Secondary | ICD-10-CM

## 2018-06-06 DIAGNOSIS — R4182 Altered mental status, unspecified: Secondary | ICD-10-CM

## 2018-06-06 DIAGNOSIS — I2510 Atherosclerotic heart disease of native coronary artery without angina pectoris: Secondary | ICD-10-CM

## 2018-06-06 DIAGNOSIS — I6523 Occlusion and stenosis of bilateral carotid arteries: Secondary | ICD-10-CM

## 2018-06-06 DIAGNOSIS — I5021 Acute systolic (congestive) heart failure: Secondary | ICD-10-CM

## 2018-06-06 DIAGNOSIS — I2583 Coronary atherosclerosis due to lipid rich plaque: Secondary | ICD-10-CM

## 2018-06-06 LAB — CBC
HCT: 32.9 % — ABNORMAL LOW (ref 39.0–52.0)
Hemoglobin: 10.5 g/dL — ABNORMAL LOW (ref 13.0–17.0)
MCH: 29.7 pg (ref 26.0–34.0)
MCHC: 31.9 g/dL (ref 30.0–36.0)
MCV: 92.9 fL (ref 78.0–100.0)
Platelets: 199 10*3/uL (ref 150–400)
RBC: 3.54 MIL/uL — ABNORMAL LOW (ref 4.22–5.81)
RDW: 12.3 % (ref 11.5–15.5)
WBC: 11.8 10*3/uL — ABNORMAL HIGH (ref 4.0–10.5)

## 2018-06-06 LAB — HEMOGLOBIN A1C
Hgb A1c MFr Bld: 6.1 % — ABNORMAL HIGH (ref 4.8–5.6)
Mean Plasma Glucose: 128.37 mg/dL

## 2018-06-06 LAB — BASIC METABOLIC PANEL
Anion gap: 6 (ref 5–15)
BUN: 28 mg/dL — AB (ref 8–23)
CHLORIDE: 107 mmol/L (ref 98–111)
CO2: 26 mmol/L (ref 22–32)
Calcium: 8.7 mg/dL — ABNORMAL LOW (ref 8.9–10.3)
Creatinine, Ser: 1.59 mg/dL — ABNORMAL HIGH (ref 0.61–1.24)
GFR calc Af Amer: 48 mL/min — ABNORMAL LOW (ref >60)
GFR, EST NON AFRICAN AMERICAN: 41 mL/min — AB (ref >60)
GLUCOSE: 95 mg/dL (ref 70–99)
POTASSIUM: 5.7 mmol/L — AB (ref 3.5–5.1)
Sodium: 139 mmol/L (ref 135–145)

## 2018-06-06 LAB — GLUCOSE, CAPILLARY: Glucose-Capillary: 92 mg/dL (ref 70–99)

## 2018-06-06 MED ORDER — STROKE: EARLY STAGES OF RECOVERY BOOK
Freq: Once | Status: DC
  Filled 2018-06-06: qty 1

## 2018-06-06 MED ORDER — SODIUM CHLORIDE 0.9 % IV SOLN
INTRAVENOUS | Status: DC
  Administered 2018-06-06 – 2018-06-07 (×4): via INTRAVENOUS

## 2018-06-06 MED ORDER — ATORVASTATIN CALCIUM 80 MG PO TABS
80.0000 mg | ORAL_TABLET | Freq: Every day | ORAL | Status: DC
  Administered 2018-06-06 – 2018-06-08 (×3): 80 mg via ORAL
  Filled 2018-06-06 (×3): qty 1

## 2018-06-06 MED ORDER — PATIROMER SORBITEX CALCIUM 8.4 G PO PACK
16.8000 g | PACK | Freq: Every day | ORAL | Status: DC
  Administered 2018-06-06 – 2018-06-09 (×3): 16.8 g via ORAL
  Filled 2018-06-06 (×6): qty 2

## 2018-06-06 NOTE — Progress Notes (Addendum)
Preliminary notes--Bilateral carotid duplex exam completed. Right ICA 80-99% stenosis, Left ICA occluded. Antegrade flow bilateral vertebral arteries. Result called Dr. Pearlean Brownie and he is aware of this critical condition.  Ian Ramos (RDMS RVT) 06/06/18 3:07 PM

## 2018-06-06 NOTE — Progress Notes (Addendum)
PROGRESS NOTE  Ian Ramos  ZOX:[id number] DOB: Jul 28, 1946 DOA: 05/28/2018 PCP: Mliss Sax, MD   Brief Narrative: Ian Denmark Crutchfieldis a 72 y.o.malewith medical history significant forchronic kidney disease stage III and cervical stenosis with myelopathy status post cervical laminectomy on 05/23/2018, now presenting to the emergency department for evaluation of chest pain. the chest pain is substernal, associated with change in the position, worse when sitting up and better laying down. He also reports left shoulder pain and unable to lift the left shoulder from pain.  Work-up significant for left bundle branch block, no old EKG to compare, and 2D echo with EF 35% with global hypokinesis. Subsequent stress testing showed intermediate risk, so cardiac catheterization was performed 9/5 showing LAD and OM2 stenosis with no intervention. The patient developed altered mental status and aphasia shortly thereafter and was found to have a left temporal lobe infarct. Neurology was consulted. Carotid U/S demonstrated significant bilateral ICA stenoses for which vascular surgery is consulted.  Assessment & Plan: Principal Problem:   Intractable pain Active Problems:   Cervical myelopathy (HCC)   Chest pain   CKD (chronic kidney disease), stage III (HCC)   Esophageal thickening   LBBB (left bundle branch block)   Postoperative anemia   DCM (dilated cardiomyopathy) (HCC)   Acute systolic CHF (congestive heart failure) (HCC)   Coronary artery disease due to lipid rich plaque  Atypical chest pain: With left shoulder pain, possibly related to esophageal reflux/spasm and/or MSK with recent cervical spine surgery vs. cardiac ischemia with LBBB and unknown chronicity. Troponins negative, echo w/EF 35-40%, diffuse hypokinesis, also has evidence of esophageal dysmotility on barium study. Stress test 9/4 was intermediate risk, LHC 9/5 showed 75% stenosis to proximal LAD, 75% to OM2 with  medical management recommended, consider PCI if angina is refractory to optimum medical therapy.  - Statin, aspirin, not on BB due to bradycardia.  Acute left temporal infarct: Most likely embolic following cardiac catheterization 9/5.  - MRI pending (ok'd per neurosurgery) - Carotid U/S pending - On antiplatelet per neurology, statin - Lipid panel pending.  - HbA1c 6.1%, will need PCP follow up. Consider metformin based on CrCl.   Bilateral carotid artery stenosis: Right ICA severely occluded (80-99%) and left ICA said to be completely occluded.  - Discussed with neurology. Will DC hydralazine in hopes to keep BP up at least above , may need to stop imdur as well.  - Vascular surgery consulted. [name] is in emergency surgery currently and will evaluate the patient later.  Dilated cardiomyopathy: Clinically euvolemic.  - Would benefit from guideline-directed therapy, but Cr up so ARB stopped (just transitioned to losartan). Bradycardia limiting beta blocker therapy.  - No need for diuretic at this time.  Esophagitis/esophageal dysmotility: CTA shows circumferential thickening of the esophagus. GI consulted for recommendations, underwent barium swallow study showing moderate dysmotility.  - PPI per GI, will need outpatient GI f/u for screening colo   Cervical myelopathy: s/p laminectomy on 8/23. - Continue cervical collar.  - Plan dispo to CIR once stroke work up complete.  - Neurosurgery ordered neurontin and steroid course (has been completed).  AKI on stage 3 CKD: - Continue IV hydration post contrast exposure. Creatinine improved modestly, will monitor daily. - Stopped ARB - Outpatient follow up with PCP. - Monitor BMP  Hyperkalemia: Most likely due to ARB initiation, though continues despite improvement in creatinine. K 5.7, no hemolysis noted.  - Continue patiromer (not given yesterday or today) - Stopped ARB -  Continue telemetry - Recheck in AM. Check  PVR.  Mild normocytic anemia: Hemoglobin stable around 10.  - Monitor  Constipation: Relieved 9/3 - Continue bowel program  DVT prophylaxis: Heparin subcutaneous. Code Status: Full Family Communication: Significant other at bedside Disposition Plan: CIR once stroke work up completed.  Consultants:   PM&R  Cardiology  Neurology  Procedures:  2D echo 05/29/2018 Study Conclusions  - Left ventricle: The cavity size was normal. Systolic function was moderately reduced. The estimated ejection fraction was in the range of 35% to 40%. Diffuse hypokinesis. Doppler parameters are consistent with abnormal left ventricular relaxation (grade 1 diastolic dysfunction). Doppler parameters are consistent with elevated ventricular end-diastolic filling pressure. - Aortic valve: A bicuspid morphology cannot be excluded; severely thickened, severely calcified leaflets. There was trivial regurgitation. - Mitral valve: There was mild regurgitation. - Left atrium: The atrium was normal in size. - Right atrium: The atrium was normal in size. - Tricuspid valve: There was trivial regurgitation. - Pulmonary arteries: Systolic pressure was within the normal range. - Inferior vena cava: The vessel was dilated. The respirophasic diameter changes were blunted (<50%), consistent with elevated central venous pressure. - Pericardium, extracardiac: There was no pericardial effusion.  Antimicrobials:  None   Subjective: More alert now after all the commotion this morning, still not quite himself, speech is more sparse. Denies any weakness or numbness. No chest pain.  Objective: Vitals:   06/06/18 0605 06/06/18 0700 06/06/18 0800 06/06/18 1000  BP: (!) 118/56 (!) 112/56 126/62   Pulse:      Resp:      Temp:    98.1 F (36.7 C)  TempSrc:      SpO2:  99% 100%   Weight: 74.7 kg     Height:        Intake/Output Summary (Last 24 hours) at 06/06/2018 1429 Last data filed  at 06/06/2018 0811 Gross per 24 hour  Intake 2427.96 ml  Output 950 ml  Net 1477.96 ml   Filed Weights   05/31/18 0425 06/06/18 0432 06/06/18 0605  Weight: 73.4 kg 73.4 kg 74.7 kg   Gen: 72 y.o. male in no distress Pulm: Nonlabored breathing room air. Clear. CV: Regular rate and rhythm. No murmur, rub, or gallop. No JVD, no dependent edema. GI: Abdomen soft, non-tender, non-distended, with normoactive bowel sounds.  Ext: Warm, no deformities Skin: No rashes, lesions or ulcers on visualized skin.  Neuro: Alert, thought he was in therapy and it was October 2015. No dysphasia, but sparse speech. No focal neurological deficits in cranial or peripheral nerves. Psych: Judgement and insight appear fair. Mood euthymic & affect congruent. Behavior is appropriate.    Data Reviewed: I have personally reviewed following labs and imaging studies  CBC: Recent Labs  Lab 06/04/18 0340 06/06/18 0905  WBC 11.7* 11.8*  HGB 10.6* 10.5*  HCT 32.5* 32.9*  MCV 91.3 92.9  PLT 239 199   Basic Metabolic Panel: Recent Labs  Lab 06/01/18 1057 06/04/18 0340 06/05/18 0349 06/06/18 0807  NA 135 135 135 139  K 4.9 5.2* 4.9 5.7*  CL 103 102 104 107  CO2 23 26 24 26   GLUCOSE 155* 160* 135* 95  BUN 33* 36* 37* 28*  CREATININE 1.50* 1.83* 1.72* 1.59*  CALCIUM 8.3* 8.3* 8.0* 8.7*   GFR: Estimated Creatinine Clearance: 39.4 mL/min (A) (by C-G formula based on SCr of 1.59 mg/dL (H)). Liver Function Tests: No results for input(s): AST, ALT, ALKPHOS, BILITOT, PROT, ALBUMIN in the last 168 hours.  No results for input(s): LIPASE, AMYLASE in the last 168 hours. No results for input(s): AMMONIA in the last 168 hours. Coagulation Profile: No results for input(s): INR, PROTIME in the last 168 hours. Cardiac Enzymes: No results for input(s): CKTOTAL, CKMB, CKMBINDEX, TROPONINI in the last 168 hours. BNP (last 3 results) No results for input(s): PROBNP in the last 8760 hours. HbA1C: Recent Labs     06/06/18 0858  HGBA1C 6.1*   CBG: Recent Labs  Lab 06/06/18 0508  GLUCAP 92   Lipid Profile: No results for input(s): CHOL, HDL, LDLCALC, TRIG, CHOLHDL, LDLDIRECT in the last 72 hours. Thyroid Function Tests: Recent Labs    06/04/18 0340  TSH 3.204   Anemia Panel: No results for input(s): VITAMINB12, FOLATE, FERRITIN, TIBC, IRON, RETICCTPCT in the last 72 hours. Urine analysis:    Component Value Date/Time   COLORURINE STRAW (A) 05/26/2018 0724   APPEARANCEUR CLEAR 05/26/2018 0724   LABSPEC 1.013 05/26/2018 0724   PHURINE 6.0 05/26/2018 0724   GLUCOSEU NEGATIVE 05/26/2018 0724   HGBUR NEGATIVE 05/26/2018 0724   BILIRUBINUR NEGATIVE 05/26/2018 0724   KETONESUR NEGATIVE 05/26/2018 0724   PROTEINUR NEGATIVE 05/26/2018 0724   UROBILINOGEN 0.2 06/25/2011 1027   NITRITE NEGATIVE 05/26/2018 0724   LEUKOCYTESUR NEGATIVE 05/26/2018 0724   No results found for this or any previous visit (from the past 240 hour(s)).    Radiology Studies: Nm Myocar Multi W/spect W/wall Motion / Ef  Result Date: 06/04/2018  There was no ST segment deviation noted during stress.  Defect 1: There is a medium defect of moderate severity present in the basal anteroseptal and mid anteroseptal location.  Defect 2: There is a medium defect of severe severity present in the basal inferior and mid inferior location.  This is an intermediate risk study.  Findings consistent with ischemia.  Nuclear stress EF: 39%.  The left ventricular ejection fraction is moderately decreased (30-44%).  Abnormal, intermediate risk stress nuclear study with predominantly fixed anteroseptal defect possibly secondary to left bundle branch block; mild to moderate inferior ischemia; gated ejection fraction 39% with hypokinesis of the septum, mild left ventricular enlargement.   Ct Head Code Stroke Wo Contrast  Result Date: 06/06/2018 CLINICAL DATA:  Code stroke. Initial evaluation for acute altered mental status, right-sided  weakness. EXAM: CT HEAD WITHOUT CONTRAST TECHNIQUE: Contiguous axial images were obtained from the base of the skull through the vertex without intravenous contrast. COMPARISON:  Prior CT from 07/16/2007. FINDINGS: Brain: Cerebral volume within normal limits for age. Patchy hypodensity within the periventricular and deep white matter both cerebral hemispheres, nonspecific, but most like related chronic small vessel ischemic disease. No acute intracranial hemorrhage. There is subtle loss of gray-white matter differentiation involving the anterior/mid left temporal lobe, suspicious for possible developing acute ischemic left MCA territory infarct (series 3, image 15 on axial view, series 6, image 52 on sagittal sequence. This corresponds with M2 region. Gray-white matter differentiation otherwise grossly maintained. No mass lesion, midline shift or mass effect. No hydrocephalus. No extra-axial fluid collection. Vascular: No hyperdense vessel. Scattered vascular calcifications noted within the carotid siphons. Skull: Scalp soft tissues and calvarium within normal limits. Postoperative changes from prior posterior decompression and laminectomy noted at the upper cervical spine with probable associated postoperative seroma. Sinuses/Orbits: Globes and orbital soft tissues within normal limits. Chronic mucosal thickening within the right maxillary sinus. Paranasal sinuses are otherwise largely clear. No mastoid effusion. Other: None. ASPECTS [facility] Stroke Program Early CT Score) - Ganglionic level infarction (caudate,  lentiform nuclei, internal capsule, insula, M1-M3 cortex): 6 - Supraganglionic infarction (M4-M6 cortex): 3 Total score (0-10 with 10 being normal): 9 IMPRESSION: 1. Subtle hypodensity involving the anterior/mid left temporal lobe, suspicious for evolving acute ischemic left MCA territory infarct. No associated hemorrhage. 2. ASPECTS is 9. 3. Chronic microvascular ischemic disease. These results were  communicated to Dr. Otelia Limes At 5:48 amon 9/6/2019by text page via the [facility] messaging system. Electronically Signed   By: Rise Mu M.D.   On: 06/06/2018 05:51    Scheduled Meds: .  stroke: mapping our early stages of recovery book   Does not apply Once  . aspirin EC  81 mg Oral Daily  . atorvastatin  80 mg Oral q1800  . enoxaparin (LOVENOX) injection  40 mg Subcutaneous Q24H  . gabapentin  300 mg Oral TID  . hydrALAZINE  10 mg Oral Q8H  . iopamidol  100 mL Intravenous Once  . isosorbide mononitrate  15 mg Oral Daily  . pantoprazole  40 mg Oral BID  . patiromer  16.8 g Oral Daily  . senna-docusate  2 tablet Oral BID  . sodium chloride flush  3 mL Intravenous Q12H   Continuous Infusions: . sodium chloride Stopped (06/06/18 0521)  . sodium chloride    . sodium chloride 100 mL/hr at 06/06/18 0951     LOS: 2 days   Time spent: 25 minutes.  Tyrone Nine, MD [facility] [email or web address] Password [email or web address] 06/06/2018, 2:29 PM

## 2018-06-06 NOTE — Progress Notes (Signed)
Shift event note: Notified by RR RN that Code Stroke had been called on pt s/p RN finding pt significantly altered from his baseline. RR RN (Puja) was paged and assessed at bedside resulting in an NIH score of 11. Family at bedside agreed pt "wasn't right". Pt was transported to Ct. This NP met pt in scanner. He is noted to be alert and oriented to self. Dr Cheral Marker arrived to scanner and evaluated pt who by now has improved (agreed by RR RN and pt's nurse). Dr Cheral Marker decided pt not a candidate for TPA given his exam and questions Delirium vs TIA. Code Stroke was d/c'd. This NP placed order for neuro checks q2h x 12 hours. Please see neurology note regarding assessment and further recommendations. Will continue to monitor closely on telemetry.   Jeryl Columbia. NP-C [facility] Pager [phone]  CRITICAL CARE Performed by: Jeryl Columbia   Total critical care time: 30 minutes  Critical care time was exclusive of separately billable procedures and treating other patients.  Critical care was necessary to treat or prevent imminent or life-threatening deterioration.  Critical care was time spent personally by me on the following activities: development of treatment plan with patient and/or surrogate as well as nursing, discussions with consultants, evaluation of patient's response to treatment, examination of patient, obtaining history from patient or surrogate, ordering and performing treatments and interventions, ordering and review of laboratory studies, ordering and review of radiographic studies, pulse oximetry and re-evaluation of patient's condition.

## 2018-06-06 NOTE — Progress Notes (Signed)
Upon morning rounds noticed patient not as verbal and alert as he was the start of shift. I asked the patient if he was in pain and he moaned a response. He was acutely confused this morning more than the  beginning of the shift. LKN was 4:50 AM. Pt was disoriented to person, place and time, He could not tell me his name, birthday or where he was located.Fiance at the bedside noted that he seemed to not be himself as well. Pt had some prior L side weakness due to Neck surgery 8-23. Paged rapid response to assess patient as well.COde stroke called,Katherine, NP paged and met in radiology for CT scan. Neurology consulted and based off of assessment concluded AMS and to cancel code stroke and place patient on TIA alert with q2h neuro checks.  Pt back in bed, given a bath, A&0.Resting in bed with call bell in reach. Will continue to monitor.

## 2018-06-06 NOTE — Progress Notes (Signed)
Inpatient Rehabilitation-Admissions Coordinator   Noted acute events this morning with stroke work up still in progress.   AC will follow up with pt on Monday to determine need for CIR.   Please call if questions.   [name], OTR/L  Rehab Admissions Coordinator  [phone] 06/06/2018 2:57 PM

## 2018-06-06 NOTE — Consult Note (Signed)
NEURO HOSPITALIST CONSULT NOTE   Requestig physician: Dr. Jarvis Newcomer  Reason for Consult: Acute onset of confusion  History obtained from:  RN and Chart     HPI:                                                                                                                                          Ian Ramos is an 72 y.o. male who is status post posterior cervical fusion with foraminotomy in late August, representing with chest pain more recently, who was noted by his nurse to be acutely confused this morning. LKN was 4:50 AM. On initial assessment by rapid response he was confused, perseverating and with RUE drift and right visual field cut.   STAT CT shows no acute abnormality.   Past Medical History:  Diagnosis Date  . Arthritis   . Neuromuscular disorder [facility])     Past Surgical History:  Procedure Laterality Date  . POSTERIOR CERVICAL FUSION/FORAMINOTOMY N/A 05/23/2018   Procedure: Posterior Cervical Fusion with lateral mass fixation - C1 - C6 with laminectomy;  Surgeon: Julio Sicks, MD;  Location: [facility] OR;  Service: Neurosurgery;  Laterality: N/A;    Family History  Problem Relation Age of Onset  . Hypertension Other   . Heart failure Mother   . CAD Father        s/p CABG in his 67s  . Heart failure Father               Social History:  reports that he has never smoked. His smokeless tobacco use includes chew. He reports that he does not drink alcohol or use drugs.  No Known Allergies  MEDICATIONS:                                                                                                                     Scheduled: . aspirin EC  81 mg Oral Daily  . enoxaparin (LOVENOX) injection  40 mg Subcutaneous Q24H  . gabapentin  300 mg Oral TID  . hydrALAZINE  10 mg Oral Q8H  . iopamidol  100 mL Intravenous Once  . isosorbide mononitrate  15 mg Oral Daily  . pantoprazole  40 mg Oral BID  . patiromer  16.8 g Oral Daily  . senna-docusate  2 tablet  Oral BID  . sodium chloride flush  3 mL Intravenous Q12H   Continuous: . sodium chloride 75 mL/hr at 06/05/18 1955  . sodium chloride    . sodium chloride       ROS:                                                                                                                                       The patient denies any symptoms at time of Neurological assessment.    Blood pressure (!) 101/54, pulse (!) 58, temperature 98.9 F (37.2 C), temperature source Oral, resp. rate 18, height 5' 7.5" (1.715 m), weight 73.4 kg, SpO2 95 %.   General Examination:                                                                                                       Physical Exam  HEENT-  [place]/AT. Has soft collar on.    Lungs- Respirations unlabored .   Extremities- Warm and well perfused    Neurological Examination Mental Status: Awake and alert. Speech is sparse but fluent with intact comprehension for basic commands. Able to state the day, month, year and location. Able to name his thumb and a penlight. Cranial Nerves: II: Visual fields intact to confrontation. PERRL III,IV, VI: EOMI without nystagmus. No ptosis.  V,VII: Grimaces symmetrically. Temp sensation equal bilaterally VIII: hearing intact to voice IX,X: Palate rises symmetrically XI: Head at midline XII: midline tongue extension Motor: BUE 4+/5 bilaterally proximal and distal BLE: 5/5 bilaterally proximal and distal Sensory: Temp and light touch intact throughout, bilaterally. No extinction. Deep Tendon Reflexes: 1+ bilateral upper extremities. 4+ patellar reflexes with crossed adductor responses. 1+ achilles bilaterally.  Plantars: Tonically upgoing bilaterally.  Cerebellar: Ataxic FNF on right. Normal/slow on left.  Gait: Deferred   Lab Results: Basic Metabolic Panel: Recent Labs  Lab 06/01/18 1057 06/04/18 0340 06/05/18 0349  NA 135 135 135  K 4.9 5.2* 4.9  CL 103 102 104  CO2 23 26 24   GLUCOSE 155* 160* 135*   BUN 33* 36* 37*  CREATININE 1.50* 1.83* 1.72*  CALCIUM 8.3* 8.3* 8.0*    CBC: Recent Labs  Lab 06/04/18 0340  WBC 11.7*  HGB 10.6*  HCT 32.5*  MCV 91.3  PLT 239    Cardiac Enzymes: No results for input(s): CKTOTAL, CKMB, CKMBINDEX, TROPONINI in the last 168 hours.  Lipid Panel: No results for input(s): CHOL, TRIG, HDL, CHOLHDL, VLDL, LDLCALC in  the last 168 hours.  Imaging: Nm Myocar Multi W/spect W/wall Motion / Ef  Result Date: 06/04/2018  There was no ST segment deviation noted during stress.  Defect 1: There is a medium defect of moderate severity present in the basal anteroseptal and mid anteroseptal location.  Defect 2: There is a medium defect of severe severity present in the basal inferior and mid inferior location.  This is an intermediate risk study.  Findings consistent with ischemia.  Nuclear stress EF: 39%.  The left ventricular ejection fraction is moderately decreased (30-44%).  Abnormal, intermediate risk stress nuclear study with predominantly fixed anteroseptal defect possibly secondary to left bundle branch block; mild to moderate inferior ischemia; gated ejection fraction 39% with hypokinesis of the septum, mild left ventricular enlargement.    Assessment: 72 year old male status post cervical fusion, representing with chest pain. Code Stroke called for sudden onset of confusion  1. Rapidly resolving symptoms and signs on exam 2. Benefits of tPA significantly outweighed by risks given rapid improvement and significant probability of etiologies for his presentation other than stroke, including hospital delirium and sedative effects of medications. 3. Not an endovascular candidate due to low NIHSS  Recommendations: 1. TIA alert with frequent neuro checks 2. Obtain MRI brain if cleared by neurosurgery given recent cervical fusion 3. Continue ASA   Electronically signed: Dr. Caryl Pina 06/06/2018, 5:45 AM

## 2018-06-06 NOTE — Progress Notes (Signed)
Progress Note  Patient Name: Ian Ramos Date of Encounter: 06/06/2018  Primary Cardiologist: No primary care provider on file.   Subjective   Had cath yesterday which showed occluded acute marginal, 75% proximal LAD, 75% OM 2 and 30% proximal to mid RCA.  Medical management was recommended.  This morning patient noted to be more lethargic with only moaning responses and very confused.  Code stroke was called and neurology assessed and is now on TIA alert with every 2 hour neurochecks.  Inpatient Medications    Scheduled Meds: . aspirin EC  81 mg Oral Daily  . enoxaparin (LOVENOX) injection  40 mg Subcutaneous Q24H  . gabapentin  300 mg Oral TID  . hydrALAZINE  10 mg Oral Q8H  . iopamidol  100 mL Intravenous Once  . isosorbide mononitrate  15 mg Oral Daily  . pantoprazole  40 mg Oral BID  . patiromer  16.8 g Oral Daily  . senna-docusate  2 tablet Oral BID  . sodium chloride flush  3 mL Intravenous Q12H   Continuous Infusions: . sodium chloride Stopped (06/06/18 0521)  . sodium chloride    . sodium chloride 100 mL/hr at 06/06/18 1610   PRN Meds: sodium chloride, acetaminophen, ALPRAZolam, bisacodyl, gi cocktail, morphine injection, nitroGLYCERIN, ondansetron (ZOFRAN) IV, oxyCODONE-acetaminophen, senna, sodium chloride flush, zolpidem   Vital Signs    Vitals:   06/06/18 0521 06/06/18 0605 06/06/18 0700 06/06/18 0800  BP: 121/64 (!) 118/56 (!) 112/56 126/62  Pulse:      Resp:      Temp:      TempSrc:      SpO2: 97%  99% 100%  Weight:  74.7 kg    Height:        Intake/Output Summary (Last 24 hours) at 06/06/2018 0814 Last data filed at 06/06/2018 0811 Gross per 24 hour  Intake 2427.96 ml  Output 1250 ml  Net 1177.96 ml   Filed Weights   05/31/18 0425 06/06/18 0432 06/06/18 0605  Weight: 73.4 kg 73.4 kg 74.7 kg    Telemetry    Normal sinus rhythm- Personally Reviewed  ECG    No new EKG to review- Personally Reviewed  Physical Exam   GEN: No acute  distress.   Neck: No JVD Cardiac: RRR, no murmurs, rubs, or gallops.  Respiratory: Clear to auscultation bilaterally. GI: Soft, nontender, non-distended  MS: No edema; No deformity. Neuro:  Nonfocal  Psych: Normal affect   Labs    Chemistry Recent Labs  Lab 06/01/18 1057 06/04/18 0340 06/05/18 0349  NA 135 135 135  K 4.9 5.2* 4.9  CL 103 102 104  CO2 23 26 24   GLUCOSE 155* 160* 135*  BUN 33* 36* 37*  CREATININE 1.50* 1.83* 1.72*  CALCIUM 8.3* 8.3* 8.0*  GFRNONAA 44* 35* 38*  GFRAA 52* 41* 44*  ANIONGAP 9 7 7      Hematology Recent Labs  Lab 06/04/18 0340  WBC 11.7*  RBC 3.56*  HGB 10.6*  HCT 32.5*  MCV 91.3  MCH 29.8  MCHC 32.6  RDW 12.0  PLT 239    Cardiac EnzymesNo results for input(s): TROPONINI in the last 168 hours. No results for input(s): TROPIPOC in the last 168 hours.   BNPNo results for input(s): BNP, PROBNP in the last 168 hours.   DDimer No results for input(s): DDIMER in the last 168 hours.   Radiology    Nm Myocar Multi W/spect W/wall Motion / Ef  Result Date: 06/04/2018  There was  no ST segment deviation noted during stress.  Defect 1: There is a medium defect of moderate severity present in the basal anteroseptal and mid anteroseptal location.  Defect 2: There is a medium defect of severe severity present in the basal inferior and mid inferior location.  This is an intermediate risk study.  Findings consistent with ischemia.  Nuclear stress EF: 39%.  The left ventricular ejection fraction is moderately decreased (30-44%).  Abnormal, intermediate risk stress nuclear study with predominantly fixed anteroseptal defect possibly secondary to left bundle branch block; mild to moderate inferior ischemia; gated ejection fraction 39% with hypokinesis of the septum, mild left ventricular enlargement.   Ct Head Code Stroke Wo Contrast  Result Date: 06/06/2018 CLINICAL DATA:  Code stroke. Initial evaluation for acute altered mental status,  right-sided weakness. EXAM: CT HEAD WITHOUT CONTRAST TECHNIQUE: Contiguous axial images were obtained from the base of the skull through the vertex without intravenous contrast. COMPARISON:  Prior CT from 07/16/2007. FINDINGS: Brain: Cerebral volume within normal limits for age. Patchy hypodensity within the periventricular and deep white matter both cerebral hemispheres, nonspecific, but most like related chronic small vessel ischemic disease. No acute intracranial hemorrhage. There is subtle loss of gray-white matter differentiation involving the anterior/mid left temporal lobe, suspicious for possible developing acute ischemic left MCA territory infarct (series 3, image 15 on axial view, series 6, image 52 on sagittal sequence. This corresponds with M2 region. Gray-white matter differentiation otherwise grossly maintained. No mass lesion, midline shift or mass effect. No hydrocephalus. No extra-axial fluid collection. Vascular: No hyperdense vessel. Scattered vascular calcifications noted within the carotid siphons. Skull: Scalp soft tissues and calvarium within normal limits. Postoperative changes from prior posterior decompression and laminectomy noted at the upper cervical spine with probable associated postoperative seroma. Sinuses/Orbits: Globes and orbital soft tissues within normal limits. Chronic mucosal thickening within the right maxillary sinus. Paranasal sinuses are otherwise largely clear. No mastoid effusion. Other: None. ASPECTS [facility] Stroke Program Early CT Score) - Ganglionic level infarction (caudate, lentiform nuclei, internal capsule, insula, M1-M3 cortex): 6 - Supraganglionic infarction (M4-M6 cortex): 3 Total score (0-10 with 10 being normal): 9 IMPRESSION: 1. Subtle hypodensity involving the anterior/mid left temporal lobe, suspicious for evolving acute ischemic left MCA territory infarct. No associated hemorrhage. 2. ASPECTS is 9. 3. Chronic microvascular ischemic disease. These results  were communicated to Dr. Otelia Limes At 5:48 amon 9/6/2019by text page via the [facility] messaging system. Electronically Signed   By: Rise Mu M.D.   On: 06/06/2018 05:51    Cardiac Studies   2D echo 05/29/2018 Study Conclusions  - Left ventricle: The cavity size was normal. Systolic function was moderately reduced. The estimated ejection fraction was in the range of 35% to 40%. Diffuse hypokinesis. Doppler parameters are consistent with abnormal left ventricular relaxation (grade 1 diastolic dysfunction). Doppler parameters are consistent with elevated ventricular end-diastolic filling pressure. - Aortic valve: A bicuspid morphology cannot be excluded; severely thickened, severely calcified leaflets. There was trivial regurgitation. - Mitral valve: There was mild regurgitation. - Left atrium: The atrium was normal in size. - Right atrium: The atrium was normal in size. - Tricuspid valve: There was trivial regurgitation. - Pulmonary arteries: Systolic pressure was within the normal range. - Inferior vena cava: The vessel was dilated. The respirophasic diameter changes were blunted (<50%), consistent with elevated central venous pressure. - Pericardium, extracardiac: There was no pericardial effusion.  Nuclear stress test 06/04/2018  There was no ST segment deviation noted during stress.  Defect  1: There is a medium defect of moderate severity present in the basal anteroseptal and mid anteroseptal location.  Defect 2: There is a medium defect of severe severity present in the basal inferior and mid inferior location.  This is an intermediate risk study.  Findings consistent with ischemia.  Nuclear stress EF: 39%.  The left ventricular ejection fraction is moderately decreased (30-44%).  Abnormal, intermediate risk stress nuclear study with predominantly fixed anteroseptal defect possibly secondary to left bundle branch block; mild to moderate  inferior ischemia; gated ejection fraction 39% with hypokinesis of the septum, mild left ventricular enlargement.  Cardiac Cath 06/05/2018  Prox RCA to Mid RCA lesion is 30% stenosed.  Acute Mrg lesion is 100% stenosed.  Prox LAD lesion is 75% stenosed.  Ost 2nd Mrg to 2nd Mrg lesion is 75% stenosed.  LV end diastolic pressure is normal.    1. Moderate obstructive CAD    - Eccentric 75% proximal LAD    - 75% OM2    - Occluded RV marginal branch with left to right collaterals. 2. Normal LVEDP  Plan: Patient does not have critical CAD. I would recommend medical management. If he has refractory angina despite optimal medical therapy would consider PCI of LAD. The OM lesion would be more difficult to treat since it involves bifurcation of a large OM1.   Recommend Aspirin 81mg  daily for moderate CAD.  Patient Profile     72 y.o. male with a history of chronic kidney disease stage III and cervical disc disease who underwent recent cervical laminectomy on 05/23/2018 without any complications. On 05/28/2018 he awakened at 4 AM with severe substernal chest pain and tried to sit up the pain became more severe. There were no associated symptoms of shortness of breath, diaphoresis or nausea. He called EMS and was given aspirin and chest pain resolved but he continued to have left shoulder pain although he says that he has intermittent shoulder pain chronically  Assessment & Plan    1.Chest pain/ASCAD. Difficult to determine whether this was due to recent cervical spine surgery versus GERD and esophageal spasm or due to coronary ischemia. -He has a left bundle branch block on EKG but no old EKG to compare to -Troponin less than 0.032 and POC troponin 0 -2D echo showed reduced LV function with EF 35% anddiffusehypokinesis -Lexiscan myoview moderate risk with mild to moderate inferior wall ischemia and HK of the septum with fixed anteroseptal defect likely related to LBBB -Cath yesterday  showed fluid acute marginal, 75% proximal LAD and 75% OM 2 with normal LVEDP.  Medical management recommended and if he has refractory angina despite optimal medical therapy then could consider PCI of the LAD.  OM lesion was felt to be difficult as it involves a bifurcation of a large OM1. -continue ASA and Imdur 15mg  daily. -no BB due to intermittent bradycardia -start high dose statin with Lipitor 80mg  daily -check FLP in am -will need FLP and ALT in 6 weeks  2. Dilated cardiomyopathy -2D echocardiogram shows new moderate LV dysfunction with EF 35% and diffuse hypokinesis likely ischemic in origin -ARB stopped due to worsening renal function with creatinine 1.83 .  -creatinine improved yesterday to 1.72 and BMET pending this am.  -No beta-blocker due to intermittent bradycardia on telemetry -He has no evidence of volume overload on exam -continue Hydralazine 10mg  TID and Imdur 15mg  daily for LV dysfunction  3. Left bundle branch block -No old EKG to compare -cath with CAD as above  4.  AMS -possible TIA with decreased alertness and confusion -Neuro on board with TIA precautions      For questions or updates, please contact [facility] Please consult [email or web address] for contact info under Cardiology/STEMI.      Signed, Armanda Magic, MD  06/06/2018, 8:14 AM

## 2018-06-06 NOTE — Consult Note (Addendum)
REASON FOR CONSULT:    Bilateral carotid disease.  The consult is requested by Dr. Jarvis Newcomer.  HPI:   Ian Ramos is a pleasant 72 y.o. male, who was admitted on 05/28/2018 with chest pain.  Patient had a stress test which was intermediate risk and therefore cardiac catheterization was recommended and was performed yesterday.  This showed moderate disease.  Subsequently the patient developed altered mental status and some a aphasia and was found to have a left temporal lobe infarct.  Carotid duplex scan shows occluded left carotid artery with a greater than 80% right carotid stenosis.  Vascular surgery is consulted.  Of note approximately 2 weeks ago he had laminectomy of C1-C6 and is in a soft cervical collar which she tells me he is to wear for another 2 weeks.  He had been having some numbness and weakness in the left arm which improved after surgery but he still has some mild residual symptoms.  Patient denies any previous history of stroke, TIAs, expressive or receptive aphasia, or amaurosis fugax.  He tells me that he was not on aspirin, Plavix, or a statin prior to admission.  He has not been to the doctor much according to his sister.  He is somewhat of a poor historian himself.  He is right-handed.  He is unaware of the symptoms that are described however his sister noted that he has had some slurred speech for most of the day although currently she states that he has pretty much fully recovered.  He is unaware of the symptoms but again is not a great historian.   Past Medical History:  Diagnosis Date  . Arthritis   . Neuromuscular disorder (HCC)     Family History  Problem Relation Age of Onset  . Hypertension Other   . Heart failure Mother   . CAD Father        s/p CABG in his 94s  . Heart failure Father   He denies any family history of premature cardiovascular disease.  SOCIAL HISTORY: He is not a smoker. Social History   Socioeconomic History  . Marital status:  Divorced    Spouse name: Not on file  . Number of children: Not on file  . Years of education: Not on file  . Highest education level: Not on file  Occupational History  . Not on file  Social Needs  . Financial resource strain: Not on file  . Food insecurity:    Worry: Not on file    Inability: Not on file  . Transportation needs:    Medical: Not on file    Non-medical: Not on file  Tobacco Use  . Smoking status: Never Smoker  . Smokeless tobacco: Current User    Types: Chew  Substance and Sexual Activity  . Alcohol use: No    Frequency: Never  . Drug use: No  . Sexual activity: Not on file  Lifestyle  . Physical activity:    Days per week: Not on file    Minutes per session: Not on file  . Stress: Not on file  Relationships  . Social connections:    Talks on phone: Not on file    Gets together: Not on file    Attends religious service: Not on file    Active member of club or organization: Not on file    Attends meetings of clubs or organizations: Not on file    Relationship status: Not on file  . Intimate partner violence:  Fear of current or ex partner: Not on file    Emotionally abused: Not on file    Physically abused: Not on file    Forced sexual activity: Not on file  Other Topics Concern  . Not on file  Social History Narrative  . Not on file    No Known Allergies  Current Facility-Administered Medications  Medication Dose Route Frequency Provider Last Rate Last Dose  .  stroke: mapping our early stages of recovery book   Does not apply Once Annie Main L, NP      . 0.9 %  sodium chloride infusion   Intravenous Continuous [place], Peter M, MD   Stopped at 06/06/18 [phone]  . 0.9 %  sodium chloride infusion  250 mL Intravenous PRN [place], Peter M, MD      . 0.9 %  sodium chloride infusion   Intravenous Continuous Tyrone Nine, MD 100 mL/hr at 06/06/18 0951    . acetaminophen (TYLENOL) tablet 650 mg  650 mg Oral Q4H PRN [place], Peter M, MD   650 mg at  05/31/18 4782  . ALPRAZolam Prudy Feeler) tablet 0.25 mg  0.25 mg Oral BID PRN [place], Peter M, MD   0.25 mg at 05/31/18 2131  . aspirin EC tablet 81 mg  81 mg Oral Daily [place], Peter M, MD   81 mg at 06/06/18 1248  . atorvastatin (LIPITOR) tablet 80 mg  80 mg Oral q1800 Turner, Traci R, MD      . bisacodyl (DULCOLAX) EC tablet 5 mg  5 mg Oral Daily PRN [place], Peter M, MD   5 mg at 06/02/18 1222  . enoxaparin (LOVENOX) injection 40 mg  40 mg Subcutaneous Q24H [place], Peter M, MD   40 mg at 06/06/18 0955  . gabapentin (NEURONTIN) capsule 300 mg  300 mg Oral TID [place], Peter M, MD   300 mg at 06/06/18 1247  . gi cocktail (Maalox,Lidocaine,Donnatal)  30 mL Oral TID PRN [place], Peter M, MD      . iopamidol (ISOVUE-370) 76 % injection 100 mL  100 mL Intravenous Once [place], Peter M, MD      . isosorbide mononitrate (IMDUR) 24 hr tablet 15 mg  15 mg Oral Daily [place], Peter M, MD   15 mg at 06/06/18 1247  . morphine 2 MG/ML injection 2-4 mg  2-4 mg Intravenous Q4H PRN [place], Peter M, MD   2 mg at 06/04/18 1816  . nitroGLYCERIN (NITROSTAT) SL tablet 0.4 mg  0.4 mg Sublingual Q5 Min x 3 PRN [place], Peter M, MD      . ondansetron [facility]) injection 4 mg  4 mg Intravenous Q6H PRN [place], Peter M, MD      . oxyCODONE-acetaminophen (PERCOCET/ROXICET) 5-325 MG per tablet 1 tablet  1 tablet Oral Q6H PRN [place], Peter M, MD   1 tablet at 06/05/18 1954  . pantoprazole (PROTONIX) EC tablet 40 mg  40 mg Oral BID [place], Peter M, MD   40 mg at 06/06/18 1248  . patiromer Lelon Perla) packet 16.8 g  16.8 g Oral Daily Tyrone Nine, MD      . senna [facility]) tablet 8.6 mg  1 tablet Oral Daily PRN [place], Peter M, MD   8.6 mg at 06/02/18 2145  . senna-docusate (Senokot-S) tablet 2 tablet  2 tablet Oral BID [place], Peter M, MD   2 tablet at 06/06/18 1247  . sodium chloride flush (NS) 0.9 % injection 3 mL  3 mL Intravenous Q12H [place], Peter  M, MD      . sodium chloride flush (NS) 0.9 % injection 3 mL  3 mL Intravenous PRN  [place], Peter M, MD      . zolpidem [facility]) tablet 5 mg  5 mg Oral QHS PRN [place], Peter M, MD        REVIEW OF SYSTEMS:  [X]  denotes positive finding, [ ]  denotes negative finding Cardiac  Comments:  Chest pain or chest pressure: x  he had chest pain on admission but this has resolved.  Shortness of breath upon exertion:    Short of breath when lying flat:    Irregular heart rhythm:        Vascular    Pain in calf, thigh, or hip brought on by ambulation:    Pain in feet at night that wakes you up from your sleep:     Blood clot in your veins:    Leg swelling:         Pulmonary    Oxygen at home:    Productive cough:     Wheezing:         Neurologic    Sudden weakness in arms or legs:  x  he describes some weakness in the left arm related to his cervical disc disease.  Sudden numbness in arms or legs:  x  likewise he had some paresthesias in his left arm related to his cervical disc disease.  Sudden onset of difficulty speaking or slurred speech:    Temporary loss of vision in one eye:     Problems with dizziness:         Gastrointestinal    Blood in stool:     Vomited blood:         Genitourinary    Burning when urinating:     Blood in urine:        Psychiatric    Major depression:         Hematologic    Bleeding problems:    Problems with blood clotting too easily:        Skin    Rashes or ulcers:        Constitutional    Fever or chills:     PHYSICAL EXAM:   Vitals:   06/06/18 0605 06/06/18 0700 06/06/18 0800 06/06/18 1000  BP: (!) 118/56 (!) 112/56 126/62   Pulse:      Resp:      Temp:    98.1 F (36.7 C)  TempSrc:      SpO2:  99% 100%   Weight: 74.7 kg     Height:        GENERAL: The patient is a well-nourished male, in no acute distress. The vital signs are documented above. CARDIAC: There is a regular rate and rhythm.  VASCULAR: He has a right carotid bruit.  I cannot detect a left carotid bruit. On the right side he has a palpable femoral,  popliteal, and dorsalis pedis pulse. On the left side he has a palpable femoral, popliteal pulse.  I cannot palpate pedal pulses.  Both feet are warm and well-perfused.  He has no significant lower extremity swelling. PULMONARY: There is good air exchange bilaterally without wheezing or rales. ABDOMEN: Soft and non-tender with normal pitched bowel sounds.  MUSCULOSKELETAL: There are no major deformities or cyanosis. NEUROLOGIC: On my exam he currently has good strength in the upper extremities and lower extremities bilaterally and no paresthesias.  He still seems to have some slight expressive  a aphasia. SKIN: There are no ulcers or rashes noted. PSYCHIATRIC: The patient has a normal affect.  DATA:    CT CERVICAL SPINE: I reviewed her CT of the cervical spine was done on 05/28/2018.  The patient has had recent C1-C6 laminectomies.  There is reportedly a small postoperative fluid collection.  HEART CATH: The patient had moderate obstructive coronary artery disease.  There was a 75% stenosis in the proximal LAD.  CAROTID DUPLEX: I reviewed the carotid duplex scan which shows a greater than 80% right carotid stenosis.  The left carotid is occluded.  CT HEAD: CT the head showed is a subtle hypodensity involving the anterior mid left temporal lobe suspicious for an evolving acute ischemic left middle cerebral artery infarct.  MRI OF THE HEAD: His MRI shows small acute and early subacute infarcts: 2 in the left superior frontal lobe and 2 in the right posterior lateral parietal lobe.  MRA HEAD:   ASSESSMENT & PLAN:   BILATERAL CAROTID DISEASE: This patient has symptomatic carotid disease.  He has an occluded left carotid and a greater than 80% right carotid.  His MRI that was just completed shows small infarcts bilaterally.  There are 2 in the left superior frontal lobe and 2 in the right posterior lateral parietal lobe.  I will wait for neurology's input on these findings.  I think it would be  reasonable to consider addressing the greater than 80% right carotid stenosis which may potentially be symptomatic.  It is also possible that this is embolic event given his recent cardiac catheterization.  I do not think he is currently a candidate for right carotid endarterectomy given his recent cervical laminectomy (C1-C6).  I can discuss with my partners and see if he might potentially be a candidate for carotid stenting.  He is currently on aspirin and a statin that I believe was started this admission.  I would recommend starting him on Plavix given that he may very well require carotid stenting and if this has to be delayed I think this might also help maximize his medical management.  I have explained this all to the patient and his sister.  He does not appear to have a good understanding of all of this.  We will follow.   Waverly Ferrari [facility] [phone]

## 2018-06-06 NOTE — Progress Notes (Addendum)
STROKE TEAM PROGRESS NOTE   INTERVAL HISTORY His wife and RN are at the bedside.  Per RN, pt did not have speech difficulties upon returning post cath yesterday. He was at baseline when she left at 7p. New onset speech probs during the night. CT showed stroke, will means it likely occurred hours prior to identification. Carotid ultrasound  Prelim report shows likely left carotid occlusion and high-grade right carotid stenosis. Vitals:   06/06/18 0521 06/06/18 0605 06/06/18 0700 06/06/18 0800  BP: 121/64 (!) 118/56 (!) 112/56 126/62  Pulse:      Resp:      Temp:      TempSrc:      SpO2: 97%  99% 100%  Weight:  74.7 kg    Height:        CBC:  Recent Labs  Lab 06/04/18 0340 06/06/18 0905  WBC 11.7* 11.8*  HGB 10.6* 10.5*  HCT 32.5* 32.9*  MCV 91.3 92.9  PLT 239 199    Basic Metabolic Panel:  Recent Labs  Lab 06/04/18 0340 06/05/18 0349  NA 135 135  K 5.2* 4.9  CL 102 104  CO2 26 24  GLUCOSE 160* 135*  BUN 36* 37*  CREATININE 1.83* 1.72*  CALCIUM 8.3* 8.0*   Lipid Panel: No results found for: CHOL, TRIG, HDL, CHOLHDL, VLDL, LDLCALC HgbA1c:  Lab Results  Component Value Date   HGBA1C 6.1 (H) 06/06/2018   Urine Drug Screen: No results found for: LABOPIA, COCAINSCRNUR, LABBENZ, AMPHETMU, THCU, LABBARB  Alcohol Level No results found for: ETH  IMAGING Nm Myocar Multi W/spect W/wall Motion / Ef  Result Date: 06/04/2018  There was no ST segment deviation noted during stress.  Defect 1: There is a medium defect of moderate severity present in the basal anteroseptal and mid anteroseptal location.  Defect 2: There is a medium defect of severe severity present in the basal inferior and mid inferior location.  This is an intermediate risk study.  Findings consistent with ischemia.  Nuclear stress EF: 39%.  The left ventricular ejection fraction is moderately decreased (30-44%).  Abnormal, intermediate risk stress nuclear study with predominantly fixed anteroseptal defect  possibly secondary to left bundle branch block; mild to moderate inferior ischemia; gated ejection fraction 39% with hypokinesis of the septum, mild left ventricular enlargement.   Ct Head Code Stroke Wo Contrast  Result Date: 06/06/2018 CLINICAL DATA:  Code stroke. Initial evaluation for acute altered mental status, right-sided weakness. EXAM: CT HEAD WITHOUT CONTRAST TECHNIQUE: Contiguous axial images were obtained from the base of the skull through the vertex without intravenous contrast. COMPARISON:  Prior CT from 07/16/2007. FINDINGS: Brain: Cerebral volume within normal limits for age. Patchy hypodensity within the periventricular and deep white matter both cerebral hemispheres, nonspecific, but most like related chronic small vessel ischemic disease. No acute intracranial hemorrhage. There is subtle loss of gray-white matter differentiation involving the anterior/mid left temporal lobe, suspicious for possible developing acute ischemic left MCA territory infarct (series 3, image 15 on axial view, series 6, image 52 on sagittal sequence. This corresponds with M2 region. Gray-white matter differentiation otherwise grossly maintained. No mass lesion, midline shift or mass effect. No hydrocephalus. No extra-axial fluid collection. Vascular: No hyperdense vessel. Scattered vascular calcifications noted within the carotid siphons. Skull: Scalp soft tissues and calvarium within normal limits. Postoperative changes from prior posterior decompression and laminectomy noted at the upper cervical spine with probable associated postoperative seroma. Sinuses/Orbits: Globes and orbital soft tissues within normal limits. Chronic mucosal thickening  within the right maxillary sinus. Paranasal sinuses are otherwise largely clear. No mastoid effusion. Other: None. ASPECTS [facility] Stroke Program Early CT Score) - Ganglionic level infarction (caudate, lentiform nuclei, internal capsule, insula, M1-M3 cortex): 6 -  Supraganglionic infarction (M4-M6 cortex): 3 Total score (0-10 with 10 being normal): 9 IMPRESSION: 1. Subtle hypodensity involving the anterior/mid left temporal lobe, suspicious for evolving acute ischemic left MCA territory infarct. No associated hemorrhage. 2. ASPECTS is 9. 3. Chronic microvascular ischemic disease. These results were communicated to Dr. Otelia Limes At 5:48 amon 9/6/2019by text page via the [facility] messaging system. Electronically Signed   By: Rise Mu M.D.   On: 06/06/2018 05:51   2D Echocardiogram  - Left ventricle: The cavity size was normal. Systolic function was moderately reduced. The estimated ejection fraction was in the range of 35% to 40%. Diffuse hypokinesis. Doppler parameters are consistent with abnormal left ventricular relaxation (grade 1 diastolic dysfunction). Doppler parameters are consistent with elevated ventricular end-diastolic filling pressure. - Aortic valve: A bicuspid morphology cannot be excluded; severely thickened, severely calcified leaflets. There was trivial regurgitation. - Mitral valve: There was mild regurgitation. - Left atrium: The atrium was normal in size. - Right atrium: The atrium was normal in size. - Tricuspid valve: There was trivial regurgitation. - Pulmonary arteries: Systolic pressure was within the normal range. - Inferior vena cava: The vessel was dilated. The respirophasic diameter changes were blunted (< 50%), consistent with elevated central venous pressure. - Pericardium, extracardiac: There was no pericardial effusion.   PHYSICAL EXAM Pleasant elderly Caucasian male currently not in distress. . Afebrile. Head is nontraumatic. Neck is supple with soft bilateral carotidt bruit.    Cardiac exam shows harsh pansystolic murmur. no gallop. Lungs are clear to auscultation. Distal pulses are well felt. Neurological Exam :  Awake alert oriented 2 only. Nonfluent speech with some word hesitation and paraphasic errors. Slight  difficulty with naming and repetition. Able to follow only one-step commands.extraocular movements are full range without nystagmus. Blinks to threat bilaterally. Face is symmetric. Tongue midline. Motor system exam symmetric upper and lower extremity strength without drift or focal weakness. Deep tendon reflexes are symmetric. Sensation is intact. Plantars downgoing. Gait not tested. ASSESSMENT/PLAN Ian Ramos is a 72 y.o. male with history of cervical fusion May 23, 2018, arthritis and neuromuscular disorder who was admitted 2 days ago with CP, catheterization yesterday who developed confusion, perseveration and RUE drip with R facial droop.    Stroke:   L temporal lobe infarct, likely embolic post cardiac cath.underlying severe carotid disease with left ICA occlusion and right high-grade stenosis  Code Stroke CT head L temporal lobe infarct. Small vessel disease. ASPECTS 9    MRI  pending (Dr. Pearlean Brownie ok'd with Dr. Dutch Quint that it was safe to do. Remove collar for MRI only, then replace when completed)  MRA  pending   MR Cervical spine w/w/o pending  MR chest w/w/o pending   Carotid Doppler  Prelim LICA occlusion and severe RICA stenosis2D Echo  EF 35-40%. Thickened AV. No source of embolus   LDL pending   HgbA1c 6.1  Lovenox 40 mg sq daily for VTE prophylaxis  No antithrombotic prior to admission, now on aspirin 81 mg daily. Continue aspirin for secondary stroke prevention  Therapy recommendations:  CIR. Would hold planned transfer there today given planned stroke workup. Agree with CIR once workup completed  Disposition:  pending   Hyperlipidemia  Home meds:  No statin  Now on lipitor 80  LDL pending, goal < 70  Continue statin at discharge  Other Stroke Risk Factors  Advanced age  Dilated cardiomyopathy, EF 35%  Other Active Problems  Cervical myelopathy s/p OR in Aug  Chest pain/ASCAD - ? D/t recent cervical spine surgery vs GERD and esophageal spasm  or d/t coronary ischemia  L BBB  CKD stage 3 Cr 1.72  Esophageal thickening  Postoperative normocytic anemia  Hospital day # 2  Annie Main, MSN, APRN, ANVP-BC, AGPCNP-BC Advanced Practice Stroke Nurse [facility] See Amion for Schedule & Pager information 06/06/2018 1:16 PM  I have personally examined this patient, reviewed notes, independently viewed imaging studies, participated in medical decision making and plan of care.ROS completed by me personally and pertinent positives fully documented  I have made any additions or clarifications directly to the above note. Agree with note above.  He has presented with sudden onset of mental status change likely aphasia secondary to left MCA branch infarct probably caused by cardiac cath but carotid ultrasound does show significant bilateral occlusive disease. Recommend IV hydration and keep systolic blood pressure greater than 120 at least and avoid hypotension. Check MRI scan of the brain and have spoken to neurosurgeon Dr. Dutch Quint to reprogram his VP shunt prior to and after the MRI.Vascular surgery consult for considering right carotid revascularization. Long discussion the patient and Dr. Hazeline Junker and answered questions. Greater than 50% time during this 35 minute visit was spent on counseling and coordination of care about his stroke  Delia Heady, MD Medical Director [facility] Pager: [phone] 06/06/2018 3:11 PM  To contact Stroke Continuity provider, please refer to [email or web address]. After hours, contact General Neurology

## 2018-06-06 NOTE — Code Documentation (Addendum)
I was called to evaluate the patient at 501 for acute mental status. Per RN, patient was unable to any questions. LSN 450.   Upon arrival, patient was alert, + soft cervical collar s/p cervical surgery. NIH was 11 (see flowsheet). I paged K. Schorr at [phone] and I activated a  Code Stroke at 517. Paged out at 521. I did speak with K. Schorr as well, she met [place] in CT. While in CT, Neuro MD came, Head CT was just completed, MD assessed patient, exam had improved, no longer had weakness or visual deficits, was still altered. Code Stroke cancelled at 542 and TIA Alert was started. NIH 4 after CT  Plan:  TIA neuro checks/VS ordered.   Call Time 501 Arrival Time 503 End Time 549

## 2018-06-07 LAB — LIPID PANEL
CHOL/HDL RATIO: 5 ratio
CHOLESTEROL: 155 mg/dL (ref 0–200)
HDL: 31 mg/dL — ABNORMAL LOW (ref >40)
LDL Cholesterol: 93 mg/dL (ref 0–99)
Triglycerides: 154 mg/dL — ABNORMAL HIGH (ref <150)
VLDL: 31 mg/dL (ref 0–40)

## 2018-06-07 LAB — BASIC METABOLIC PANEL
Anion gap: 8 (ref 5–15)
BUN: 29 mg/dL — ABNORMAL HIGH (ref 8–23)
CHLORIDE: 104 mmol/L (ref 98–111)
CO2: 24 mmol/L (ref 22–32)
CREATININE: 1.7 mg/dL — AB (ref 0.61–1.24)
Calcium: 8.3 mg/dL — ABNORMAL LOW (ref 8.9–10.3)
GFR calc Af Amer: 44 mL/min — ABNORMAL LOW (ref >60)
GFR calc non Af Amer: 38 mL/min — ABNORMAL LOW (ref >60)
Glucose, Bld: 116 mg/dL — ABNORMAL HIGH (ref 70–99)
POTASSIUM: 4.4 mmol/L (ref 3.5–5.1)
SODIUM: 136 mmol/L (ref 135–145)

## 2018-06-07 LAB — CBC
HEMATOCRIT: 30 % — AB (ref 39.0–52.0)
Hemoglobin: 9.5 g/dL — ABNORMAL LOW (ref 13.0–17.0)
MCH: 29.1 pg (ref 26.0–34.0)
MCHC: 31.7 g/dL (ref 30.0–36.0)
MCV: 92 fL (ref 78.0–100.0)
Platelets: 176 10*3/uL (ref 150–400)
RBC: 3.26 MIL/uL — ABNORMAL LOW (ref 4.22–5.81)
RDW: 12.1 % (ref 11.5–15.5)
WBC: 9.1 10*3/uL (ref 4.0–10.5)

## 2018-06-07 MED ORDER — CLOPIDOGREL BISULFATE 75 MG PO TABS
75.0000 mg | ORAL_TABLET | Freq: Every day | ORAL | Status: DC
  Administered 2018-06-07 – 2018-06-09 (×3): 75 mg via ORAL
  Filled 2018-06-07 (×3): qty 1

## 2018-06-07 NOTE — Progress Notes (Signed)
STROKE TEAM PROGRESS NOTE   INTERVAL HISTORY His sister is at the bedside. Pt has been stable neurologically. Still has left UE weakness from the myelopathy s/p sx. MRI showed left M1/M2 and right MCA/PCA watershed and right parietal punctate infarcts, consistent with hypoperfusion from right ICA high grade stenosis and left ICA occlusion. VVS plan for CAS.   Vitals:   06/06/18 1600 06/06/18 2018 06/06/18 2347 06/07/18 0323  BP: (!) 120/58 108/61 (!) 119/57 (!) 163/57  Pulse:  69 68 74  Resp: 19 16 (!) 39 (!) 22  Temp:  98.6 F (37 C) 98.3 F (36.8 C) 98.4 F (36.9 C)  TempSrc:  Oral Oral Oral  SpO2: 99% 98% 98% 100%  Weight:      Height:        CBC:  Recent Labs  Lab 06/06/18 0905 06/07/18 0226  WBC 11.8* 9.1  HGB 10.5* 9.5*  HCT 32.9* 30.0*  MCV 92.9 92.0  PLT 199 176    Basic Metabolic Panel:  Recent Labs  Lab 06/06/18 0807 06/07/18 0226  NA 139 136  K 5.7* 4.4  CL 107 104  CO2 26 24  GLUCOSE 95 116*  BUN 28* 29*  CREATININE 1.59* 1.70*  CALCIUM 8.7* 8.3*   Lipid Panel:     Component Value Date/Time   CHOL 155 06/07/2018 0226   TRIG 154 (H) 06/07/2018 0226   HDL 31 (L) 06/07/2018 0226   CHOLHDL 5.0 06/07/2018 0226   VLDL 31 06/07/2018 0226   LDLCALC 93 06/07/2018 0226   HgbA1c:  Lab Results  Component Value Date   HGBA1C 6.1 (H) 06/06/2018   Urine Drug Screen: No results found for: LABOPIA, COCAINSCRNUR, LABBENZ, AMPHETMU, THCU, LABBARB  Alcohol Level No results found for: [facility]  IMAGING [name]  Result Date: 06/06/2018 CLINICAL DATA:  72 y/o M; acute altered mental status and right-sided weakness. Stroke evaluation. EXAM: MRI HEAD WITHOUT CONTRAST MRA HEAD WITHOUT CONTRAST TECHNIQUE: Axial DWI, coronal DWI, axial T2 FLAIR sequences of the head were acquired. Angiographic images of the head were obtained using MRA technique without contrast. The patient was unable to continue and additional sequences were not acquired. COMPARISON:   06/06/2018 CT head.  05/28/2018 CT cervical spine. FINDINGS: MRI HEAD FINDINGS Brain: Subcentimeter foci of reduced diffusion compatible with acute/early subacute infarction are present, 2 in the left superior frontal lobe, and 2 in the right posterior lateral parietal lobe. No signal abnormality in the left anterior temporal lobe, findings on CT likely related to artifact. Several nonspecific T2 FLAIR hyperintensities in subcortical and periventricular white matter are compatible with mild chronic microvascular ischemic changes for age. Mild volume loss of the brain. No hydrocephalus, extra-axial collection, or herniation. Vascular: As below. Skull and upper cervical spine: Partially visualized cervical spinal fusion and laminectomy. Large fluid collection within the postoperative bed as seen on prior CT of the cervical spine. No reduced diffusion of the visible portions of the fluid collection to indicate infection. Sinuses/Orbits: Negative. Other: None. MRA HEAD FINDINGS Motion degraded study. Anterior circulation: Minimal flow related signal within the left cervical internal carotid artery and no appreciable flow related signal of the left petrous and cavernous segments of the left internal carotid artery. Faint flow related signal within the terminal left ICA, probably retrograde. Right internal carotid artery is patent, moderate to severe stenosis of the right cavernous segment. Normal flow related signal within the right MCA and bilateral ACA distributions. Large anterior communicating artery largely perfusing the left  MCA distribution which is patent. Asymmetry in the MCA distribution signal, left less than right, is likely due to signal loss from delayed transit via A-comm collateralization. Posterior circulation: Faint flow related signal in the left posterior cerebral artery which appears to be a fetal PCA is likely due to signal loss from delayed transit due to A-comm collateralization from the right  ICA. Normal flow related signal within the vertebral arteries, basilar artery, and right PCA. IMPRESSION: MRI head: 1. Diffusion and T2 FLAIR weighted sequences were acquired. The patient was unable to continue and additional sequences were not acquired. 2. Subcentimeter acute/early subacute infarctions, 2 in the left superior frontal lobe, and 2 in the right posterolateral parietal lobe. No associated mass effect. 3. Mild chronic microvascular ischemic changes and volume loss of the brain. MRA head: 1. Extensive motion artifact. 2. Minimal flow related signal in the left cervical ICA and absent flow related signal within the petrous and cavernous segments of left ICA. Findings may represent occlusion or proximal high-grade stenosis. Age indeterminate. 3. Large anterior communicating artery providing collateral circulation to the left MCA and left PCA distributions. Decreased signal in left MCA and left PCA distribution from increased transit time. CTA of the head and neck can better assess vessel stenosis and patency. If clinically indicated consider CT perfusion of the head to assess for brain oligemia. These results will be called to the ordering clinician or representative by the Radiologist Assistant, and communication documented in the PACS or zVision Dashboard. Electronically Signed   By: Mitzi Hansen M.D.   On: 06/06/2018 18:14   Mr Ian Ramos Head Wo Contrast  Result Date: 06/06/2018 CLINICAL DATA:  72 y/o M; acute altered mental status and right-sided weakness. Stroke evaluation. EXAM: MRI HEAD WITHOUT CONTRAST MRA HEAD WITHOUT CONTRAST TECHNIQUE: Axial DWI, coronal DWI, axial T2 FLAIR sequences of the head were acquired. Angiographic images of the head were obtained using MRA technique without contrast. The patient was unable to continue and additional sequences were not acquired. COMPARISON:  06/06/2018 CT head.  05/28/2018 CT cervical spine. FINDINGS: MRI HEAD FINDINGS Brain: Subcentimeter foci  of reduced diffusion compatible with acute/early subacute infarction are present, 2 in the left superior frontal lobe, and 2 in the right posterior lateral parietal lobe. No signal abnormality in the left anterior temporal lobe, findings on CT likely related to artifact. Several nonspecific T2 FLAIR hyperintensities in subcortical and periventricular white matter are compatible with mild chronic microvascular ischemic changes for age. Mild volume loss of the brain. No hydrocephalus, extra-axial collection, or herniation. Vascular: As below. Skull and upper cervical spine: Partially visualized cervical spinal fusion and laminectomy. Large fluid collection within the postoperative bed as seen on prior CT of the cervical spine. No reduced diffusion of the visible portions of the fluid collection to indicate infection. Sinuses/Orbits: Negative. Other: None. MRA HEAD FINDINGS Motion degraded study. Anterior circulation: Minimal flow related signal within the left cervical internal carotid artery and no appreciable flow related signal of the left petrous and cavernous segments of the left internal carotid artery. Faint flow related signal within the terminal left ICA, probably retrograde. Right internal carotid artery is patent, moderate to severe stenosis of the right cavernous segment. Normal flow related signal within the right MCA and bilateral ACA distributions. Large anterior communicating artery largely perfusing the left MCA distribution which is patent. Asymmetry in the MCA distribution signal, left less than right, is likely due to signal loss from delayed transit via A-comm collateralization. Posterior circulation: Faint  flow related signal in the left posterior cerebral artery which appears to be a fetal PCA is likely due to signal loss from delayed transit due to A-comm collateralization from the right ICA. Normal flow related signal within the vertebral arteries, basilar artery, and right PCA. IMPRESSION:  MRI head: 1. Diffusion and T2 FLAIR weighted sequences were acquired. The patient was unable to continue and additional sequences were not acquired. 2. Subcentimeter acute/early subacute infarctions, 2 in the left superior frontal lobe, and 2 in the right posterolateral parietal lobe. No associated mass effect. 3. Mild chronic microvascular ischemic changes and volume loss of the brain. MRA head: 1. Extensive motion artifact. 2. Minimal flow related signal in the left cervical ICA and absent flow related signal within the petrous and cavernous segments of left ICA. Findings may represent occlusion or proximal high-grade stenosis. Age indeterminate. 3. Large anterior communicating artery providing collateral circulation to the left MCA and left PCA distributions. Decreased signal in left MCA and left PCA distribution from increased transit time. CTA of the head and neck can better assess vessel stenosis and patency. If clinically indicated consider CT perfusion of the head to assess for brain oligemia. These results will be called to the ordering clinician or representative by the Radiologist Assistant, and communication documented in the PACS or zVision Dashboard. Electronically Signed   By: Mitzi Hansen M.D.   On: 06/06/2018 18:14   Ct Head Code Stroke Wo Contrast  Result Date: 06/06/2018 CLINICAL DATA:  Code stroke. Initial evaluation for acute altered mental status, right-sided weakness. EXAM: CT HEAD WITHOUT CONTRAST TECHNIQUE: Contiguous axial images were obtained from the base of the skull through the vertex without intravenous contrast. COMPARISON:  Prior CT from 07/16/2007. FINDINGS: Brain: Cerebral volume within normal limits for age. Patchy hypodensity within the periventricular and deep white matter both cerebral hemispheres, nonspecific, but most like related chronic small vessel ischemic disease. No acute intracranial hemorrhage. There is subtle loss of gray-white matter differentiation  involving the anterior/mid left temporal lobe, suspicious for possible developing acute ischemic left MCA territory infarct (series 3, image 15 on axial view, series 6, image 52 on sagittal sequence. This corresponds with M2 region. Gray-white matter differentiation otherwise grossly maintained. No mass lesion, midline shift or mass effect. No hydrocephalus. No extra-axial fluid collection. Vascular: No hyperdense vessel. Scattered vascular calcifications noted within the carotid siphons. Skull: Scalp soft tissues and calvarium within normal limits. Postoperative changes from prior posterior decompression and laminectomy noted at the upper cervical spine with probable associated postoperative seroma. Sinuses/Orbits: Globes and orbital soft tissues within normal limits. Chronic mucosal thickening within the right maxillary sinus. Paranasal sinuses are otherwise largely clear. No mastoid effusion. Other: None. ASPECTS [facility] Stroke Program Early CT Score) - Ganglionic level infarction (caudate, lentiform nuclei, internal capsule, insula, M1-M3 cortex): 6 - Supraganglionic infarction (M4-M6 cortex): 3 Total score (0-10 with 10 being normal): 9 IMPRESSION: 1. Subtle hypodensity involving the anterior/mid left temporal lobe, suspicious for evolving acute ischemic left MCA territory infarct. No associated hemorrhage. 2. ASPECTS is 9. 3. Chronic microvascular ischemic disease. These results were communicated to Dr. Otelia Limes At 5:48 amon 9/6/2019by text page via the [facility] messaging system. Electronically Signed   By: Rise Mu M.D.   On: 06/06/2018 05:51   2D Echocardiogram  - Left ventricle: The cavity size was normal. Systolic function was moderately reduced. The estimated ejection fraction was in the range of 35% to 40%. Diffuse hypokinesis. Doppler parameters are consistent with abnormal left ventricular  relaxation (grade 1 diastolic dysfunction). Doppler parameters are consistent with elevated  ventricular end-diastolic filling pressure. - Aortic valve: A bicuspid morphology cannot be excluded; severely thickened, severely calcified leaflets. There was trivial regurgitation. - Mitral valve: There was mild regurgitation. - Left atrium: The atrium was normal in size. - Right atrium: The atrium was normal in size. - Tricuspid valve: There was trivial regurgitation. - Pulmonary arteries: Systolic pressure was within the normal range. - Inferior vena cava: The vessel was dilated. The respirophasic diameter changes were blunted (< 50%), consistent with elevated central venous pressure. - Pericardium, extracardiac: There was no pericardial effusion.  CUS - Right ICA 80-99% stenosis, Left ICA occluded. Antegrade flow bilateral vertebral arteries.   PHYSICAL EXAM Pleasant elderly Caucasian male currently not in distress. Afebrile. Head is nontraumatic. Neck is with soft C-collar.    Cardiac exam shows harsh pansystolic murmur. no gallop. Lungs are clear to auscultation. Distal pulses are well felt. Neurological Exam :  Awake alert oriented 3. Speech intact. Normal with naming and repetition. Able to follow only one-step commands. extraocular movements are full range without nystagmus. Blinks to threat bilaterally. Face is symmetric. Tongue midline. Motor system exam showed left deltoid 3/5 and tricep 3-/5, bicep 4+/5. Other limbs 5/5 strength. Deep tendon reflexes are decreased on the LUE , otherwise symmetric. Sensation is intact. Plantars downgoing. Coordination intact RUE. Gait not tested.  ASSESSMENT/PLAN Mr. Ian Ramos is a 72 y.o. male with history of cervical fusion May 23, 2018, arthritis and neuromuscular disorder who was admitted 2 days ago with CP, catheterization yesterday who developed confusion, perseveration and RUE drip with R facial droop.    Stroke:   L periventricular M1/M2 watershed, right MCA/PCA watershed, and right parietal punctate infarcts, likely due to  hypoperfusion in the setting of high grade stenosis right ICA with left ICA occlusion and hypotension  Code Stroke CT head concerning for L temporal lobe infarct.     MRI L periventricular M1/M2 watershed, right MCA/PCA watershed, and right parietal punctate infarcts,  MRA motion artifact with likely decreased left MCA and PCA flow   Carotid Doppler  LICA occlusion and severe RICA stenosis 80-99%  2D Echo  EF 35-40%  LDL 93   HgbA1c 6.1  Lovenox 40 mg sq daily for VTE prophylaxis  No antithrombotic prior to admission, now on aspirin 81 mg daily and plavix 75mg . Continue DAPT for 3 weeks and then ASA alone.    Therapy recommendations:  CIR   Disposition:  pending   ICA stenosis/occlusion  CUS LICA occlusion and severe RICA stenosis 80-99%  MRA showed left MCA and PCA received collateral flow from large ACOM  Pt stroke likely due to hypoperfusion  VVS on board and considering CAS on the right  Recommend CAS done within 2 weeks according to guideline.   Avoid hypotension  BP goal 130-150  CAD   S/p cardiac cath  Moderate CAD recommend medical treatment  On DAPT now  Cardiology on board  On imdur  Hold off hydralazine so far  Hyperlipidemia  Home meds:  No statin  Now on lipitor 80  LDL 93, goal < 70  Continue statin at discharge  Other Stroke Risk Factors  Advanced age  Dilated cardiomyopathy, EF 35-40%  Other Active Problems  Cervical myelopathy s/p Sx in Aug - residue left UE weakness  L BBB  CKD stage III Cr 1.72->1.59->1.70  Esophageal thickening  Postoperative normocytic anemia  Hospital day # 3   I spent  35 minutes in total face-to-face time with the patient, more than 50% of which was spent in counseling and coordination of care, reviewing test results, images and medication, and discussing the diagnosis of stroke, carotid stenosis, CAD, myelopathy, treatment plan and potential prognosis. This patient's care requiresreview of  multiple databases, neurological assessment, discussion with family, other specialists and medical decision making of high complexity. I had long discussion with pt and sister at bedside, updated pt current condition, treatment plan and potential prognosis. They expressed understanding and appreciation. I also discussed with Dr. Jarvis Newcomer.   Neurology will sign off. Please call with questions. Pt will follow up with stroke clinic NP at [facility] in about 4 weeks. Thanks for the consult.   Marvel Plan, MD PhD Stroke Neurology 06/07/2018 11:16 AM   To contact Stroke Continuity provider, please refer to [email or web address]. After hours, contact General Neurology

## 2018-06-07 NOTE — Progress Notes (Signed)
PROGRESS NOTE  Ian Ramos  WUJ:[id number] DOB: [birth year] DOA: 05/28/2018 PCP: Mliss Sax, MD   Brief Narrative: Ian Denmark Crutchfieldis a 72 y.o.malewith medical history significant forchronic kidney disease stage III and cervical stenosis with myelopathy status post cervical laminectomy on 05/23/2018, now presenting to the emergency department for evaluation of chest pain. the chest pain is substernal, associated with change in the position, worse when sitting up and better laying down. He also reports left shoulder pain and unable to lift the left shoulder from pain.  Work-up significant for left bundle branch block, no old EKG to compare, and 2D echo with EF 35% with global hypokinesis. Subsequent stress testing showed intermediate risk, so cardiac catheterization was performed 9/5 showing LAD and OM2 stenosis with no intervention. The patient developed altered mental status and aphasia shortly thereafter and was found to have a left temporal lobe infarct. Neurology was consulted. Carotid U/S demonstrated significant bilateral ICA stenoses (80-99% on right, complete occlusion on left) for which vascular surgery was consulted. Plan is for consideration of carotid artery stenting on the right. Aspirin and plavix in addition to statin have been started. Continues on imdur for antianginal reasons, though further therapy hampered by bradycardia, renal insufficiency, and need to avoid hypotension with ICA stenosis.   Assessment & Plan: Principal Problem:   Intractable pain Active Problems:   Cervical myelopathy (HCC)   Chest pain   CKD (chronic kidney disease), stage III (HCC)   Esophageal thickening   LBBB (left bundle branch block)   Postoperative anemia   DCM (dilated cardiomyopathy) (HCC)   Acute systolic CHF (congestive heart failure) (HCC)   Coronary artery disease due to lipid rich plaque  Atypical chest pain, CAD: With left shoulder pain, possibly related to  esophageal reflux/spasm and/or MSK with recent cervical spine surgery vs. cardiac ischemia with LBBB and unknown chronicity. Troponins negative, echo w/EF 35-40%, diffuse hypokinesis, also has evidence of esophageal dysmotility on barium study. Stress test 9/4 was intermediate risk, LHC 9/5 showed 75% stenosis to proximal LAD, 75% to OM2 with medical management recommended, consider PCI if angina is refractory to optimum medical therapy.  - Statin, aspirin, now plavix, not on BB due to bradycardia. - Imdur started at low dose. Will continue cautiously, may have to hold if hypotension returns.  Acute bilateral CVA: Initially felt to be embolic, though with critical bilateral carotid stenosis and hypotension as well as watershed distribution of most infarcts, ICA stenosis is felt to be etiology. Locations include periventricular left M1/M2 distribution, right MCA/PCA distribution, and right temporoparietal cortex with resultant confusion and aphasia.   - On antiplatelet per neurology, statin - HbA1c 6.1%, will need PCP follow up. Consider metformin based on CrCl.   Bilateral carotid artery stenosis: Right ICA severely occluded (80-99%) and left ICA said to be completely occluded.  - Discussed with neurology. Will DC hydralazine in hopes to keep BP up at least above (ideal 130-137mmHg SBP). Currently at goal. - Vascular surgery consulted. [name] feels recent cervical fusion makes the patient a poor CEA candidate, but will discuss with partners regarding stenting of right ICA. Per neurology, this should occur within 2 weeks.  - ASA, plavix, statin  Dilated cardiomyopathy: Clinically euvolemic.  - Would benefit from guideline-directed therapy, but Cr up so ARB stopped (just transitioned to losartan). Bradycardia limiting beta blocker therapy.  - No need for diuretic at this time. Will stop IVF  Hyperlipidemia: LDL 93.  - Start statin for secondary prevention.  Esophagitis/esophageal  dysmotility: CTA shows circumferential thickening of the esophagus. GI consulted for recommendations, underwent barium swallow study showing moderate dysmotility.  - PPI per GI, will need outpatient GI f/u for screening colo   Cervical myelopathy: s/p laminectomy on 8/23. - Continue cervical collar.  - Plan dispo to CIR once work up and management completed. - Neurosurgery ordered neurontin and steroid course (has been completed).  AKI on stage 3 CKD: - Will stop IVF given cardiomyopathy and restart of diet. - Stopped ARB - Outpatient follow up with PCP. - Monitor BMP  Hyperkalemia: Most likely due to ARB initiation. Resolved. - Continue patiromer daily - Stopped ARB - Continue telemetry - Monitor K, Cr daily  Mild normocytic anemia: Hemoglobin stable around 10.  - Monitor  Constipation: Resolved - Continue bowel program  DVT prophylaxis: Heparin subcutaneous. Code Status: Full Family Communication: Sister at bedside Disposition Plan: CIR remains the planned disposition. Will need to discuss with vascular surgery regarding timing of carotid stenting.  Consultants:   PM&R  GI  Cardiology  Neurology  Vascular surgery  Procedures:  2D echo 05/29/2018 Study Conclusions  - Left ventricle: The cavity size was normal. Systolic function was moderately reduced. The estimated ejection fraction was in the range of 35% to 40%. Diffuse hypokinesis. Doppler parameters are consistent with abnormal left ventricular relaxation (grade 1 diastolic dysfunction). Doppler parameters are consistent with elevated ventricular end-diastolic filling pressure. - Aortic valve: A bicuspid morphology cannot be excluded; severely thickened, severely calcified leaflets. There was trivial regurgitation. - Mitral valve: There was mild regurgitation. - Left atrium: The atrium was normal in size. - Right atrium: The atrium was normal in size. - Tricuspid valve: There was  trivial regurgitation. - Pulmonary arteries: Systolic pressure was within the normal range. - Inferior vena cava: The vessel was dilated. The respirophasic diameter changes were blunted (<50%), consistent with elevated central venous pressure. - Pericardium, extracardiac: There was no pericardial effusion.  Antimicrobials:  None   Subjective: No complaints. Right shoulder pain has actually significantly improved. More alert, and started diet. Is not aware of any weakness, numbness, speech difficulties. Sister at bedside says he's still mildly confused. No dyspnea or chest pain.  Objective: Vitals:   06/06/18 2018 06/06/18 2347 06/07/18 0323 06/07/18 0813  BP: 108/61 (!) 119/57 (!) 163/57 134/65  Pulse: 69 68 74 94  Resp: 16 (!) 39 (!) 22 19  Temp: 98.6 F (37 C) 98.3 F (36.8 C) 98.4 F (36.9 C) 98 F (36.7 C)  TempSrc: Oral Oral Oral Oral  SpO2: 98% 98% 100% 96%  Weight:      Height:        Intake/Output Summary (Last 24 hours) at 06/07/2018 1145 Last data filed at 06/07/2018 0815 Gross per 24 hour  Intake 460 ml  Output 950 ml  Net -490 ml   Filed Weights   05/31/18 0425 06/06/18 0432 06/06/18 0605  Weight: 73.4 kg 73.4 kg 74.7 kg   Gen: 72 y.o. male in no distress Pulm: Nonlabored breathing room air. Clear. CV: Regular rate and rhythm. No murmur, rub, or gallop. No JVD, no dependent edema. GI: Abdomen soft, non-tender, non-distended, with normoactive bowel sounds.  Ext: Warm, no deformities Skin: No rashes, lesions or ulcers on visualized skin.  Neuro: Alert, oriented to year, month, and city, state (not hospital or day). No focal neurological deficits. Psych: Judgement and insight appear impaired. Mood euthymic & affect congruent. Behavior is appropriate.    Data Reviewed: I have personally reviewed  following labs and imaging studies  CBC: Recent Labs  Lab 06/04/18 0340 06/06/18 0905 06/07/18 0226  WBC 11.7* 11.8* 9.1  HGB 10.6* 10.5* 9.5*  HCT  32.5* 32.9* 30.0*  MCV 91.3 92.9 92.0  PLT 239 199 176   Basic Metabolic Panel: Recent Labs  Lab 06/01/18 1057 06/04/18 0340 06/05/18 0349 06/06/18 0807 06/07/18 0226  NA 135 135 135 139 136  K 4.9 5.2* 4.9 5.7* 4.4  CL 103 102 104 107 104  CO2 23 26 24 26 24   GLUCOSE 155* 160* 135* 95 116*  BUN 33* 36* 37* 28* 29*  CREATININE 1.50* 1.83* 1.72* 1.59* 1.70*  CALCIUM 8.3* 8.3* 8.0* 8.7* 8.3*   GFR: Estimated Creatinine Clearance: 36.8 mL/min (A) (by C-G formula based on SCr of 1.7 mg/dL (H)). Liver Function Tests: No results for input(s): AST, ALT, ALKPHOS, BILITOT, PROT, ALBUMIN in the last 168 hours. No results for input(s): LIPASE, AMYLASE in the last 168 hours. No results for input(s): AMMONIA in the last 168 hours. Coagulation Profile: No results for input(s): INR, PROTIME in the last 168 hours. Cardiac Enzymes: No results for input(s): CKTOTAL, CKMB, CKMBINDEX, TROPONINI in the last 168 hours. BNP (last 3 results) No results for input(s): PROBNP in the last 8760 hours. HbA1C: Recent Labs    06/06/18 0858  HGBA1C 6.1*   CBG: Recent Labs  Lab 06/06/18 0508  GLUCAP 92   Lipid Profile: Recent Labs    06/07/18 0226  CHOL 155  HDL 31*  LDLCALC 93  TRIG 448*  CHOLHDL 5.0   Thyroid Function Tests: No results for input(s): TSH, T4TOTAL, FREET4, T3FREE, THYROIDAB in the last 72 hours. Anemia Panel: No results for input(s): VITAMINB12, FOLATE, FERRITIN, TIBC, IRON, RETICCTPCT in the last 72 hours. Urine analysis:    Component Value Date/Time   COLORURINE STRAW (A) 05/26/2018 0724   APPEARANCEUR CLEAR 05/26/2018 0724   LABSPEC 1.013 05/26/2018 0724   PHURINE 6.0 05/26/2018 0724   GLUCOSEU NEGATIVE 05/26/2018 0724   HGBUR NEGATIVE 05/26/2018 0724   BILIRUBINUR NEGATIVE 05/26/2018 0724   KETONESUR NEGATIVE 05/26/2018 0724   PROTEINUR NEGATIVE 05/26/2018 0724   UROBILINOGEN 0.2 06/25/2011 1027   NITRITE NEGATIVE 05/26/2018 0724   LEUKOCYTESUR NEGATIVE  05/26/2018 0724   No results found for this or any previous visit (from the past 240 hour(s)).    Radiology Studies: Mr Brain 97 Contrast  Result Date: 06/06/2018 CLINICAL DATA:  72 y/o M; acute altered mental status and right-sided weakness. Stroke evaluation. EXAM: MRI HEAD WITHOUT CONTRAST MRA HEAD WITHOUT CONTRAST TECHNIQUE: Axial DWI, coronal DWI, axial T2 FLAIR sequences of the head were acquired. Angiographic images of the head were obtained using MRA technique without contrast. The patient was unable to continue and additional sequences were not acquired. COMPARISON:  06/06/2018 CT head.  05/28/2018 CT cervical spine. FINDINGS: MRI HEAD FINDINGS Brain: Subcentimeter foci of reduced diffusion compatible with acute/early subacute infarction are present, 2 in the left superior frontal lobe, and 2 in the right posterior lateral parietal lobe. No signal abnormality in the left anterior temporal lobe, findings on CT likely related to artifact. Several nonspecific T2 FLAIR hyperintensities in subcortical and periventricular white matter are compatible with mild chronic microvascular ischemic changes for age. Mild volume loss of the brain. No hydrocephalus, extra-axial collection, or herniation. Vascular: As below. Skull and upper cervical spine: Partially visualized cervical spinal fusion and laminectomy. Large fluid collection within the postoperative bed as seen on prior CT of the cervical spine. No  reduced diffusion of the visible portions of the fluid collection to indicate infection. Sinuses/Orbits: Negative. Other: None. MRA HEAD FINDINGS Motion degraded study. Anterior circulation: Minimal flow related signal within the left cervical internal carotid artery and no appreciable flow related signal of the left petrous and cavernous segments of the left internal carotid artery. Faint flow related signal within the terminal left ICA, probably retrograde. Right internal carotid artery is patent, moderate to  severe stenosis of the right cavernous segment. Normal flow related signal within the right MCA and bilateral ACA distributions. Large anterior communicating artery largely perfusing the left MCA distribution which is patent. Asymmetry in the MCA distribution signal, left less than right, is likely due to signal loss from delayed transit via A-comm collateralization. Posterior circulation: Faint flow related signal in the left posterior cerebral artery which appears to be a fetal PCA is likely due to signal loss from delayed transit due to A-comm collateralization from the right ICA. Normal flow related signal within the vertebral arteries, basilar artery, and right PCA. IMPRESSION: MRI head: 1. Diffusion and T2 FLAIR weighted sequences were acquired. The patient was unable to continue and additional sequences were not acquired. 2. Subcentimeter acute/early subacute infarctions, 2 in the left superior frontal lobe, and 2 in the right posterolateral parietal lobe. No associated mass effect. 3. Mild chronic microvascular ischemic changes and volume loss of the brain. MRA head: 1. Extensive motion artifact. 2. Minimal flow related signal in the left cervical ICA and absent flow related signal within the petrous and cavernous segments of left ICA. Findings may represent occlusion or proximal high-grade stenosis. Age indeterminate. 3. Large anterior communicating artery providing collateral circulation to the left MCA and left PCA distributions. Decreased signal in left MCA and left PCA distribution from increased transit time. CTA of the head and neck can better assess vessel stenosis and patency. If clinically indicated consider CT perfusion of the head to assess for brain oligemia. These results will be called to the ordering clinician or representative by the Radiologist Assistant, and communication documented in the PACS or zVision Dashboard. Electronically Signed   By: Mitzi Hansen M.D.   On: 06/06/2018  18:14   [name]  Result Date: 06/06/2018 CLINICAL DATA:  72 y/o M; acute altered mental status and right-sided weakness. Stroke evaluation. EXAM: MRI HEAD WITHOUT CONTRAST MRA HEAD WITHOUT CONTRAST TECHNIQUE: Axial DWI, coronal DWI, axial T2 FLAIR sequences of the head were acquired. Angiographic images of the head were obtained using MRA technique without contrast. The patient was unable to continue and additional sequences were not acquired. COMPARISON:  06/06/2018 CT head.  05/28/2018 CT cervical spine. FINDINGS: MRI HEAD FINDINGS Brain: Subcentimeter foci of reduced diffusion compatible with acute/early subacute infarction are present, 2 in the left superior frontal lobe, and 2 in the right posterior lateral parietal lobe. No signal abnormality in the left anterior temporal lobe, findings on CT likely related to artifact. Several nonspecific T2 FLAIR hyperintensities in subcortical and periventricular white matter are compatible with mild chronic microvascular ischemic changes for age. Mild volume loss of the brain. No hydrocephalus, extra-axial collection, or herniation. Vascular: As below. Skull and upper cervical spine: Partially visualized cervical spinal fusion and laminectomy. Large fluid collection within the postoperative bed as seen on prior CT of the cervical spine. No reduced diffusion of the visible portions of the fluid collection to indicate infection. Sinuses/Orbits: Negative. Other: None. MRA HEAD FINDINGS Motion degraded study. Anterior circulation: Minimal flow related signal within  the left cervical internal carotid artery and no appreciable flow related signal of the left petrous and cavernous segments of the left internal carotid artery. Faint flow related signal within the terminal left ICA, probably retrograde. Right internal carotid artery is patent, moderate to severe stenosis of the right cavernous segment. Normal flow related signal within the right MCA and bilateral  ACA distributions. Large anterior communicating artery largely perfusing the left MCA distribution which is patent. Asymmetry in the MCA distribution signal, left less than right, is likely due to signal loss from delayed transit via A-comm collateralization. Posterior circulation: Faint flow related signal in the left posterior cerebral artery which appears to be a fetal PCA is likely due to signal loss from delayed transit due to A-comm collateralization from the right ICA. Normal flow related signal within the vertebral arteries, basilar artery, and right PCA. IMPRESSION: MRI head: 1. Diffusion and T2 FLAIR weighted sequences were acquired. The patient was unable to continue and additional sequences were not acquired. 2. Subcentimeter acute/early subacute infarctions, 2 in the left superior frontal lobe, and 2 in the right posterolateral parietal lobe. No associated mass effect. 3. Mild chronic microvascular ischemic changes and volume loss of the brain. MRA head: 1. Extensive motion artifact. 2. Minimal flow related signal in the left cervical ICA and absent flow related signal within the petrous and cavernous segments of left ICA. Findings may represent occlusion or proximal high-grade stenosis. Age indeterminate. 3. Large anterior communicating artery providing collateral circulation to the left MCA and left PCA distributions. Decreased signal in left MCA and left PCA distribution from increased transit time. CTA of the head and neck can better assess vessel stenosis and patency. If clinically indicated consider CT perfusion of the head to assess for brain oligemia. These results will be called to the ordering clinician or representative by the Radiologist Assistant, and communication documented in the PACS or zVision Dashboard. Electronically Signed   By: Mitzi Hansen M.D.   On: 06/06/2018 18:14   Ct Head Code Stroke Wo Contrast  Result Date: 06/06/2018 CLINICAL DATA:  Code stroke. Initial  evaluation for acute altered mental status, right-sided weakness. EXAM: CT HEAD WITHOUT CONTRAST TECHNIQUE: Contiguous axial images were obtained from the base of the skull through the vertex without intravenous contrast. COMPARISON:  Prior CT from 07/16/2007. FINDINGS: Brain: Cerebral volume within normal limits for age. Patchy hypodensity within the periventricular and deep white matter both cerebral hemispheres, nonspecific, but most like related chronic small vessel ischemic disease. No acute intracranial hemorrhage. There is subtle loss of gray-white matter differentiation involving the anterior/mid left temporal lobe, suspicious for possible developing acute ischemic left MCA territory infarct (series 3, image 15 on axial view, series 6, image 52 on sagittal sequence. This corresponds with M2 region. Gray-white matter differentiation otherwise grossly maintained. No mass lesion, midline shift or mass effect. No hydrocephalus. No extra-axial fluid collection. Vascular: No hyperdense vessel. Scattered vascular calcifications noted within the carotid siphons. Skull: Scalp soft tissues and calvarium within normal limits. Postoperative changes from prior posterior decompression and laminectomy noted at the upper cervical spine with probable associated postoperative seroma. Sinuses/Orbits: Globes and orbital soft tissues within normal limits. Chronic mucosal thickening within the right maxillary sinus. Paranasal sinuses are otherwise largely clear. No mastoid effusion. Other: None. ASPECTS [facility] Stroke Program Early CT Score) - Ganglionic level infarction (caudate, lentiform nuclei, internal capsule, insula, M1-M3 cortex): 6 - Supraganglionic infarction (M4-M6 cortex): 3 Total score (0-10 with 10 being normal): 9 IMPRESSION: 1.  Subtle hypodensity involving the anterior/mid left temporal lobe, suspicious for evolving acute ischemic left MCA territory infarct. No associated hemorrhage. 2. ASPECTS is 9. 3. Chronic  microvascular ischemic disease. These results were communicated to Dr. Otelia Limes At 5:48 amon 9/6/2019by text page via the [facility] messaging system. Electronically Signed   By: Rise Mu M.D.   On: 06/06/2018 05:51    Scheduled Meds: .  stroke: mapping our early stages of recovery book   Does not apply Once  . aspirin EC  81 mg Oral Daily  . atorvastatin  80 mg Oral q1800  . clopidogrel  75 mg Oral Daily  . enoxaparin (LOVENOX) injection  40 mg Subcutaneous Q24H  . gabapentin  300 mg Oral TID  . iopamidol  100 mL Intravenous Once  . isosorbide mononitrate  15 mg Oral Daily  . pantoprazole  40 mg Oral BID  . patiromer  16.8 g Oral Daily  . senna-docusate  2 tablet Oral BID  . sodium chloride flush  3 mL Intravenous Q12H   Continuous Infusions: . sodium chloride Stopped (06/06/18 0521)  . sodium chloride    . sodium chloride 100 mL/hr at 06/07/18 0624     LOS: 3 days   Time spent: 25 minutes.  Tyrone Nine, MD [facility] [email or web address] Password [email or web address] 06/07/2018, 11:45 AM

## 2018-06-07 NOTE — Progress Notes (Signed)
   VASCULAR SURGERY ASSESSMENT & PLAN:   BILATERAL CAROTID DISEASE: This patient has an occluded left carotid with a greater than 80% right carotid stenosis.  MRI shows small infarcts bilaterally.  His symptoms have been confusion and some expressive aphasia.  He just recently had cervical disc laminectomy (C1-C6).  For this reason I do not think he is a candidate for right carotid endarterectomy.  I think he could be considered for carotid stenting via a femoral approach.  He did have a CT of the chest which does allow me to look at the arch and this does not appear to be a problem with respect to carotid stenting.  I will discuss the case with Dr. Fabienne Bruns or Dr. Durene Cal about the potential for a right carotid stent.  I have started him on Plavix.  SUBJECTIVE:   No specific complaints but continues to have some slight expressive a aphasia.  PHYSICAL EXAM:   Vitals:   06/06/18 1600 06/06/18 2018 06/06/18 2347 06/07/18 0323  BP: (!) 120/58 108/61 (!) 119/57 (!) 163/57  Pulse:  69 68 74  Resp: 19 16 (!) 39 (!) 22  Temp:  98.6 F (37 C) 98.3 F (36.8 C) 98.4 F (36.9 C)  TempSrc:  Oral Oral Oral  SpO2: 99% 98% 98% 100%  Weight:      Height:       Slight expressive aphasia. No focal weakness noted.  LABS:   Lab Results  Component Value Date   WBC 9.1 06/07/2018   HGB 9.5 (L) 06/07/2018   HCT 30.0 (L) 06/07/2018   MCV 92.0 06/07/2018   PLT 176 06/07/2018   Lab Results  Component Value Date   CREATININE 1.70 (H) 06/07/2018   No results found for: INR, PROTIME CBG (last 3)  Recent Labs    06/06/18 0508  GLUCAP 92    PROBLEM LIST:    Principal Problem:   Intractable pain Active Problems:   Cervical myelopathy (HCC)   Chest pain   CKD (chronic kidney disease), stage III (HCC)   Esophageal thickening   LBBB (left bundle branch block)   Postoperative anemia   DCM (dilated cardiomyopathy) (HCC)   Acute systolic CHF (congestive heart failure) (HCC)  Coronary artery disease due to lipid rich plaque   CURRENT MEDS:   .  stroke: mapping our early stages of recovery book   Does not apply Once  . aspirin EC  81 mg Oral Daily  . atorvastatin  80 mg Oral q1800  . enoxaparin (LOVENOX) injection  40 mg Subcutaneous Q24H  . gabapentin  300 mg Oral TID  . iopamidol  100 mL Intravenous Once  . isosorbide mononitrate  15 mg Oral Daily  . pantoprazole  40 mg Oral BID  . patiromer  16.8 g Oral Daily  . senna-docusate  2 tablet Oral BID  . sodium chloride flush  3 mL Intravenous Q12H    Waverly Ferrari Beeper: [phone] Office: [phone] 06/07/2018

## 2018-06-07 NOTE — Progress Notes (Signed)
Progress Note  Patient Name: Ian Ramos Date of Encounter: 06/07/2018  Primary Cardiologist:   No primary care provider on file.   Subjective   Breathing OK.  No chest pain.   Inpatient Medications    Scheduled Meds: .  stroke: mapping our early stages of recovery book   Does not apply Once  . aspirin EC  81 mg Oral Daily  . atorvastatin  80 mg Oral q1800  . clopidogrel  75 mg Oral Daily  . enoxaparin (LOVENOX) injection  40 mg Subcutaneous Q24H  . gabapentin  300 mg Oral TID  . iopamidol  100 mL Intravenous Once  . isosorbide mononitrate  15 mg Oral Daily  . pantoprazole  40 mg Oral BID  . patiromer  16.8 g Oral Daily  . senna-docusate  2 tablet Oral BID  . sodium chloride flush  3 mL Intravenous Q12H   Continuous Infusions: . sodium chloride Stopped (06/06/18 0521)  . sodium chloride    . sodium chloride 100 mL/hr at 06/07/18 0624   PRN Meds: sodium chloride, acetaminophen, ALPRAZolam, bisacodyl, gi cocktail, morphine injection, nitroGLYCERIN, ondansetron (ZOFRAN) IV, oxyCODONE-acetaminophen, senna, sodium chloride flush, zolpidem   Vital Signs    Vitals:   06/06/18 2018 06/06/18 2347 06/07/18 0323 06/07/18 0813  BP: 108/61 (!) 119/57 (!) 163/57 134/65  Pulse: 69 68 74 94  Resp: 16 (!) 39 (!) 22 19  Temp: 98.6 F (37 C) 98.3 F (36.8 C) 98.4 F (36.9 C) 98 F (36.7 C)  TempSrc: Oral Oral Oral Oral  SpO2: 98% 98% 100% 96%  Weight:      Height:        Intake/Output Summary (Last 24 hours) at 06/07/2018 1048 Last data filed at 06/07/2018 0815 Gross per 24 hour  Intake 460 ml  Output 950 ml  Net -490 ml   Filed Weights   05/31/18 0425 06/06/18 0432 06/06/18 0605  Weight: 73.4 kg 73.4 kg 74.7 kg    Telemetry    NSR - Personally Reviewed  ECG    NA - Personally Reviewed  Physical Exam   GEN: No acute distress.   Neck: No  JVD Cardiac: RRR, no murmurs, rubs, or gallops.  Respiratory: Clear  to auscultation bilaterally. GI: Soft,  nontender, non-distended  MS: No  edema; No deformity. Neuro:  Nonfocal  Psych: Normal affect   Labs    Chemistry Recent Labs  Lab 06/05/18 0349 06/06/18 0807 06/07/18 0226  NA 135 139 136  K 4.9 5.7* 4.4  CL 104 107 104  CO2 24 26 24   GLUCOSE 135* 95 116*  BUN 37* 28* 29*  CREATININE 1.72* 1.59* 1.70*  CALCIUM 8.0* 8.7* 8.3*  GFRNONAA 38* 41* 38*  GFRAA 44* 48* 44*  ANIONGAP 7 6 8      Hematology Recent Labs  Lab 06/04/18 0340 06/06/18 0905 06/07/18 0226  WBC 11.7* 11.8* 9.1  RBC 3.56* 3.54* 3.26*  HGB 10.6* 10.5* 9.5*  HCT 32.5* 32.9* 30.0*  MCV 91.3 92.9 92.0  MCH 29.8 29.7 29.1  MCHC 32.6 31.9 31.7  RDW 12.0 12.3 12.1  PLT 239 199 176    Cardiac EnzymesNo results for input(s): TROPONINI in the last 168 hours. No results for input(s): TROPIPOC in the last 168 hours.   BNPNo results for input(s): BNP, PROBNP in the last 168 hours.   DDimer No results for input(s): DDIMER in the last 168 hours.   Radiology    [name]  Result Date:  06/06/2018 CLINICAL DATA:  72 y/o M; acute altered mental status and right-sided weakness. Stroke evaluation. EXAM: MRI HEAD WITHOUT CONTRAST MRA HEAD WITHOUT CONTRAST TECHNIQUE: Axial DWI, coronal DWI, axial T2 FLAIR sequences of the head were acquired. Angiographic images of the head were obtained using MRA technique without contrast. The patient was unable to continue and additional sequences were not acquired. COMPARISON:  06/06/2018 CT head.  05/28/2018 CT cervical spine. FINDINGS: MRI HEAD FINDINGS Brain: Subcentimeter foci of reduced diffusion compatible with acute/early subacute infarction are present, 2 in the left superior frontal lobe, and 2 in the right posterior lateral parietal lobe. No signal abnormality in the left anterior temporal lobe, findings on CT likely related to artifact. Several nonspecific T2 FLAIR hyperintensities in subcortical and periventricular white matter are compatible with mild chronic  microvascular ischemic changes for age. Mild volume loss of the brain. No hydrocephalus, extra-axial collection, or herniation. Vascular: As below. Skull and upper cervical spine: Partially visualized cervical spinal fusion and laminectomy. Large fluid collection within the postoperative bed as seen on prior CT of the cervical spine. No reduced diffusion of the visible portions of the fluid collection to indicate infection. Sinuses/Orbits: Negative. Other: None. MRA HEAD FINDINGS Motion degraded study. Anterior circulation: Minimal flow related signal within the left cervical internal carotid artery and no appreciable flow related signal of the left petrous and cavernous segments of the left internal carotid artery. Faint flow related signal within the terminal left ICA, probably retrograde. Right internal carotid artery is patent, moderate to severe stenosis of the right cavernous segment. Normal flow related signal within the right MCA and bilateral ACA distributions. Large anterior communicating artery largely perfusing the left MCA distribution which is patent. Asymmetry in the MCA distribution signal, left less than right, is likely due to signal loss from delayed transit via A-comm collateralization. Posterior circulation: Faint flow related signal in the left posterior cerebral artery which appears to be a fetal PCA is likely due to signal loss from delayed transit due to A-comm collateralization from the right ICA. Normal flow related signal within the vertebral arteries, basilar artery, and right PCA. IMPRESSION: MRI head: 1. Diffusion and T2 FLAIR weighted sequences were acquired. The patient was unable to continue and additional sequences were not acquired. 2. Subcentimeter acute/early subacute infarctions, 2 in the left superior frontal lobe, and 2 in the right posterolateral parietal lobe. No associated mass effect. 3. Mild chronic microvascular ischemic changes and volume loss of the brain. MRA head:  1. Extensive motion artifact. 2. Minimal flow related signal in the left cervical ICA and absent flow related signal within the petrous and cavernous segments of left ICA. Findings may represent occlusion or proximal high-grade stenosis. Age indeterminate. 3. Large anterior communicating artery providing collateral circulation to the left MCA and left PCA distributions. Decreased signal in left MCA and left PCA distribution from increased transit time. CTA of the head and neck can better assess vessel stenosis and patency. If clinically indicated consider CT perfusion of the head to assess for brain oligemia. These results will be called to the ordering clinician or representative by the Radiologist Assistant, and communication documented in the PACS or zVision Dashboard. Electronically Signed   By: Mitzi Hansen M.D.   On: 06/06/2018 18:14   [name]  Result Date: 06/06/2018 CLINICAL DATA:  72 y/o M; acute altered mental status and right-sided weakness. Stroke evaluation. EXAM: MRI HEAD WITHOUT CONTRAST MRA HEAD WITHOUT CONTRAST TECHNIQUE: Axial DWI, coronal DWI,  axial T2 FLAIR sequences of the head were acquired. Angiographic images of the head were obtained using MRA technique without contrast. The patient was unable to continue and additional sequences were not acquired. COMPARISON:  06/06/2018 CT head.  05/28/2018 CT cervical spine. FINDINGS: MRI HEAD FINDINGS Brain: Subcentimeter foci of reduced diffusion compatible with acute/early subacute infarction are present, 2 in the left superior frontal lobe, and 2 in the right posterior lateral parietal lobe. No signal abnormality in the left anterior temporal lobe, findings on CT likely related to artifact. Several nonspecific T2 FLAIR hyperintensities in subcortical and periventricular white matter are compatible with mild chronic microvascular ischemic changes for age. Mild volume loss of the brain. No hydrocephalus, extra-axial  collection, or herniation. Vascular: As below. Skull and upper cervical spine: Partially visualized cervical spinal fusion and laminectomy. Large fluid collection within the postoperative bed as seen on prior CT of the cervical spine. No reduced diffusion of the visible portions of the fluid collection to indicate infection. Sinuses/Orbits: Negative. Other: None. MRA HEAD FINDINGS Motion degraded study. Anterior circulation: Minimal flow related signal within the left cervical internal carotid artery and no appreciable flow related signal of the left petrous and cavernous segments of the left internal carotid artery. Faint flow related signal within the terminal left ICA, probably retrograde. Right internal carotid artery is patent, moderate to severe stenosis of the right cavernous segment. Normal flow related signal within the right MCA and bilateral ACA distributions. Large anterior communicating artery largely perfusing the left MCA distribution which is patent. Asymmetry in the MCA distribution signal, left less than right, is likely due to signal loss from delayed transit via A-comm collateralization. Posterior circulation: Faint flow related signal in the left posterior cerebral artery which appears to be a fetal PCA is likely due to signal loss from delayed transit due to A-comm collateralization from the right ICA. Normal flow related signal within the vertebral arteries, basilar artery, and right PCA. IMPRESSION: MRI head: 1. Diffusion and T2 FLAIR weighted sequences were acquired. The patient was unable to continue and additional sequences were not acquired. 2. Subcentimeter acute/early subacute infarctions, 2 in the left superior frontal lobe, and 2 in the right posterolateral parietal lobe. No associated mass effect. 3. Mild chronic microvascular ischemic changes and volume loss of the brain. MRA head: 1. Extensive motion artifact. 2. Minimal flow related signal in the left cervical ICA and absent flow  related signal within the petrous and cavernous segments of left ICA. Findings may represent occlusion or proximal high-grade stenosis. Age indeterminate. 3. Large anterior communicating artery providing collateral circulation to the left MCA and left PCA distributions. Decreased signal in left MCA and left PCA distribution from increased transit time. CTA of the head and neck can better assess vessel stenosis and patency. If clinically indicated consider CT perfusion of the head to assess for brain oligemia. These results will be called to the ordering clinician or representative by the Radiologist Assistant, and communication documented in the PACS or zVision Dashboard. Electronically Signed   By: Mitzi Hansen M.D.   On: 06/06/2018 18:14   Ct Head Code Stroke Wo Contrast  Result Date: 06/06/2018 CLINICAL DATA:  Code stroke. Initial evaluation for acute altered mental status, right-sided weakness. EXAM: CT HEAD WITHOUT CONTRAST TECHNIQUE: Contiguous axial images were obtained from the base of the skull through the vertex without intravenous contrast. COMPARISON:  Prior CT from 07/16/2007. FINDINGS: Brain: Cerebral volume within normal limits for age. Patchy hypodensity within the periventricular and  deep white matter both cerebral hemispheres, nonspecific, but most like related chronic small vessel ischemic disease. No acute intracranial hemorrhage. There is subtle loss of gray-white matter differentiation involving the anterior/mid left temporal lobe, suspicious for possible developing acute ischemic left MCA territory infarct (series 3, image 15 on axial view, series 6, image 52 on sagittal sequence. This corresponds with M2 region. Gray-white matter differentiation otherwise grossly maintained. No mass lesion, midline shift or mass effect. No hydrocephalus. No extra-axial fluid collection. Vascular: No hyperdense vessel. Scattered vascular calcifications noted within the carotid siphons. Skull:  Scalp soft tissues and calvarium within normal limits. Postoperative changes from prior posterior decompression and laminectomy noted at the upper cervical spine with probable associated postoperative seroma. Sinuses/Orbits: Globes and orbital soft tissues within normal limits. Chronic mucosal thickening within the right maxillary sinus. Paranasal sinuses are otherwise largely clear. No mastoid effusion. Other: None. ASPECTS [facility] Stroke Program Early CT Score) - Ganglionic level infarction (caudate, lentiform nuclei, internal capsule, insula, M1-M3 cortex): 6 - Supraganglionic infarction (M4-M6 cortex): 3 Total score (0-10 with 10 being normal): 9 IMPRESSION: 1. Subtle hypodensity involving the anterior/mid left temporal lobe, suspicious for evolving acute ischemic left MCA territory infarct. No associated hemorrhage. 2. ASPECTS is 9. 3. Chronic microvascular ischemic disease. These results were communicated to Dr. Otelia Limes At 5:48 amon 9/6/2019by text page via the [facility] messaging system. Electronically Signed   By: Rise Mu M.D.   On: 06/06/2018 05:51    Cardiac Studies   Cardiac cath:  Left Anterior Descending  Prox LAD lesion 75% stenosed  Prox LAD lesion is 75% stenosed. The lesion is eccentric.  Second Septal Branch  Left Circumflex  Second Obtuse Marginal Branch  Ost 2nd Mrg to 2nd Mrg lesion 75% stenosed  Ost 2nd Mrg to 2nd Mrg lesion is 75% stenosed.  Right Coronary Artery  Prox RCA to Mid RCA lesion 30% stenosed  Prox RCA to Mid RCA lesion is 30% stenosed.  Acute Marginal Branch  Collaterals  Acute Mrg filled by collaterals from 2nd Sept.    Acute Mrg lesion 100% stenosed  Acute Mrg lesion is 100% stenosed. The lesion is chronically occluded with left-to-right collateral flow.     Patient Profile     72 y.o. male with a history of chronic kidney disease stage III and cervical disc disease who underwent recent cervical laminectomy on 05/23/2018 without any  complications. On 05/28/2018 he awakened at 4 AM with severe substernal chest pain and tried to sit up the pain became more severe. There were no associated symptoms of shortness of breath, diaphoresis or nausea. He called EMS and was given aspirin and chest pain resolved but he continued to have left shoulder pain although he says that he has intermittent shoulder pain chronically  Assessment & Plan    CHEST PAIN:   CAD as above with plans for optimal medical management with PCI if clear anginal refractory symptoms.   No change in therapy.  We will see again on Monday if there are no acute problems over the weekend.    DILATED CM:   Therapy is limited because he hs had bradycardia and because he has CKD.  ARB held.  Continue hydralazine, nitrates and diuretic.      For questions or updates, please contact [facility] Please consult [email or web address] for contact info under Cardiology/STEMI.   Signed, Rollene Rotunda, MD  06/07/2018, 10:48 AM

## 2018-06-08 LAB — BASIC METABOLIC PANEL
ANION GAP: 10 (ref 5–15)
BUN: 23 mg/dL (ref 8–23)
CALCIUM: 8.7 mg/dL — AB (ref 8.9–10.3)
CO2: 22 mmol/L (ref 22–32)
CREATININE: 1.46 mg/dL — AB (ref 0.61–1.24)
Chloride: 104 mmol/L (ref 98–111)
GFR calc Af Amer: 53 mL/min — ABNORMAL LOW (ref >60)
GFR, EST NON AFRICAN AMERICAN: 46 mL/min — AB (ref >60)
GLUCOSE: 111 mg/dL — AB (ref 70–99)
Potassium: 4.4 mmol/L (ref 3.5–5.1)
Sodium: 136 mmol/L (ref 135–145)

## 2018-06-08 MED ORDER — ISOSORBIDE MONONITRATE ER 30 MG PO TB24
30.0000 mg | ORAL_TABLET | Freq: Every day | ORAL | Status: DC
  Administered 2018-06-09: 30 mg via ORAL
  Filled 2018-06-08: qty 1

## 2018-06-08 NOTE — Progress Notes (Signed)
   VASCULAR SURGERY ASSESSMENT & PLAN:   BILATERAL CAROTID DISEASE WITH BILATERAL STROKES: This patient has an occluded left carotid artery and a greater than 80% right carotid stenosis.  He has some expressive a aphasia and some weakness bilaterally.  His MRI showed 2 small infarcts on the left in the superior frontal lobe, and 2 small infarcts on the right in the posterior lateral parietal lobe.  Given his recent neck surgery with laminectomy at C1-C6, he is not a candidate for carotid endarterectomy.  In addition, he has coronary artery disease.  This reason I am going to have Dr. Darrick Penna or Dr. Myra Gianotti evaluate him for a right carotid stent.  I have started him on Plavix.  Neurology would like this to be done within a 2-week window.   CORONARY ARTERY DISEASE: He had a 75% stenosis in his proximal LAD.  Certainly we would want cardiology's input before we proceed with any intervention.  STAGE III CHRONIC KIDNEY DISEASE: His GFR is 46.  Creatinine is 1.46.  Would continue gentle hydration in anticipation of carotid stenting in the next 2 weeks.  SUBJECTIVE:   Seems more alert this morning.  PHYSICAL EXAM:   Vitals:   06/08/18 0000 06/08/18 0558 06/08/18 0600 06/08/18 0838  BP: 128/65 (!) 146/60 132/60 (!) 152/72  Pulse:  62  73  Resp:  19 18 16   Temp:  98.3 F (36.8 C)  97.8 F (36.6 C)  TempSrc:  Oral  Oral  SpO2:  97%  95%  Weight:      Height:       He still has bilateral upper extremity weakness and has not been out of bed yet ambulating.  Some of his weakness and paresthesias in the left arm is related to his cervical disc disease.  LABS:   Lab Results  Component Value Date   WBC 9.1 06/07/2018   HGB 9.5 (L) 06/07/2018   HCT 30.0 (L) 06/07/2018   MCV 92.0 06/07/2018   PLT 176 06/07/2018   Lab Results  Component Value Date   CREATININE 1.46 (H) 06/08/2018   CBG (last 3)  Recent Labs    06/06/18 0508  GLUCAP 92    PROBLEM LIST:    Principal Problem:  Intractable pain Active Problems:   Cervical myelopathy (HCC)   Chest pain   CKD (chronic kidney disease), stage III (HCC)   Esophageal thickening   LBBB (left bundle branch block)   Postoperative anemia   DCM (dilated cardiomyopathy) (HCC)   Acute systolic CHF (congestive heart failure) (HCC)   Coronary artery disease due to lipid rich plaque   CURRENT MEDS:   .  stroke: mapping our early stages of recovery book   Does not apply Once  . aspirin EC  81 mg Oral Daily  . atorvastatin  80 mg Oral q1800  . clopidogrel  75 mg Oral Daily  . enoxaparin (LOVENOX) injection  40 mg Subcutaneous Q24H  . gabapentin  300 mg Oral TID  . iopamidol  100 mL Intravenous Once  . isosorbide mononitrate  15 mg Oral Daily  . pantoprazole  40 mg Oral BID  . patiromer  16.8 g Oral Daily  . senna-docusate  2 tablet Oral BID  . sodium chloride flush  3 mL Intravenous Q12H    Waverly Ferrari Beeper: [phone] Office: [phone] 06/08/2018

## 2018-06-08 NOTE — Evaluation (Signed)
Speech Language Pathology Evaluation Patient Details Name: Ian Ramos MRN: [id number] DOB: 1946/08/03 Today's Date: 06/08/2018 Time: 4098-1191 SLP Time Calculation (min) (ACUTE ONLY): 26 min  Problem List:  Patient Active Problem List   Diagnosis Date Noted  . Acute systolic CHF (congestive heart failure) (HCC)   . Coronary artery disease due to lipid rich plaque   . DCM (dilated cardiomyopathy) (HCC)   . Intractable pain 05/28/2018  . Chest pain 05/28/2018  . CKD (chronic kidney disease), stage III (HCC) 05/28/2018  . Esophageal thickening 05/28/2018  . LBBB (left bundle branch block) 05/28/2018  . Postoperative anemia 05/28/2018  . Status post cervical spinal fusion 05/23/2018  . Cervical myelopathy (HCC) 05/23/2018  . Left arm weakness 09/20/2017  . Brachial plexopathy 09/16/2017  . Need for pneumococcal vaccination 09/16/2017  . Need for influenza vaccination 09/16/2017   Past Medical History:  Past Medical History:  Diagnosis Date  . Arthritis   . Neuromuscular disorder [facility])    Past Surgical History:  Past Surgical History:  Procedure Laterality Date  . LEFT HEART CATH AND CORONARY ANGIOGRAPHY N/A 06/05/2018   Procedure: LEFT HEART CATH AND CORONARY ANGIOGRAPHY;  Surgeon: [place], Peter M, MD;  Location: [facility] INVASIVE CV LAB;  Service: Cardiovascular;  Laterality: N/A;  . POSTERIOR CERVICAL FUSION/FORAMINOTOMY N/A 05/23/2018   Procedure: Posterior Cervical Fusion with lateral mass fixation - C1 - C6 with laminectomy;  Surgeon: Julio Sicks, MD;  Location: [facility] OR;  Service: Neurosurgery;  Laterality: N/A;   HPI:  Ian Ramos is a 72 y.o. male with medical history significant for chronic kidney disease stage III and cervical stenosis with myelopathy status post cervical laminectomy on 05/23/2018, now presenting to the emergency department for evaluation of chest pain.  Patient reports that he was experiencing pain in the central chest upon waking this morning, much  worse when he tried to sit up, worse with any movement, and not associated with shortness of breath, diaphoresis, or nausea.  Pain is been constant and localized to the central chest.  He has not experienced these symptoms previously.  Denies swelling or tenderness in the lower extremities.  Denies fevers or chills.  MRI is showing acute/early subacute infarcts:  2 in left superiour frontal lobe and 2 in the right posterolateral parietal lobe.    Assessment / Plan / Recommendation Clinical Impression  Cognitive/linguistic and motor speech screen were completed.  The patient's sister was at the bedside and reported that he was completely independent and working fulltime prior to admission.  The patient's speech was clear and easy to understand.  No discernible dysarthria was noted.  Oral mechanism exam was completed and unremarkable.  The patient achieved a score of 16 out of a possible 28 points (ie writing and copying tasks were not administered).   He was oriented to person and time but disoriented to place and situation.  Despite re-orientation he always responded that he was in [place].  His immediate and delayed recall was very poor.  He was only able to name 1/3 words immediately and 0/3 given a short delay.  Use of semantic cues or multiple choice were minimally successful (ie 1/3).  Attention to task was poor and he required multiple redirections to task.  He was grabbing for things that were not there etc.This led to poor performance on the attention task (ie 2/5).   He was able to name objects and read/comprehend a short sentence.  He was able to follow a 3 step direction when  asked what next.  He was able to state each part of the direction but he was unable to physically complete the steps.  He struggled to accurately complete the repetition task.  He also has deficits in problem solving requiring cues to provide logical solutions to simple problems.  He did have awareness that he had not done  well on the assessment but it's unclear if he understands how these issues may impact his ability to function independently at home.  Currently the patient will require 24x7 supervision.  ST will follow during acute stay and he will require ST follow up at the next level of care given his current deficits.      SLP Assessment  SLP Recommendation/Assessment: Patient needs continued Speech Lanaguage Pathology Services SLP Visit Diagnosis: Attention and concentration deficit Attention and concentration deficit following: Cerebral infarction    Follow Up Recommendations  Other (comment)(continued ST at next level of care)    Frequency and Duration min 2x/week  2 weeks      SLP Evaluation Cognition  Overall Cognitive Status: Impaired/Different from baseline Arousal/Alertness: Awake/alert Orientation Level: Oriented to person;Oriented to time;Disoriented to place;Disoriented to situation Attention: Focused Focused Attention: Impaired Focused Attention Impairment: Verbal basic Memory: Impaired Memory Impairment: Storage deficit;Decreased recall of new information Awareness: Appears intact Problem Solving: Impaired Problem Solving Impairment: Verbal basic Safety/Judgment: Impaired       Comprehension  Auditory Comprehension Overall Auditory Comprehension: Appears within functional limits for tasks assessed Commands: Within Functional Limits Conversation: Simple Interfering Components: Attention Reading Comprehension Reading Status: Within funtional limits    Expression Expression Primary Mode of Expression: Verbal Verbal Expression Overall Verbal Expression: Appears within functional limits for tasks assessed Initiation: No impairment Automatic Speech: Name;Social Response Level of Generative/Spontaneous Verbalization: Phrase;Sentence Repetition: No impairment Naming: No impairment Pragmatics: No impairment Interfering Components: Attention Written Expression Written  Expression: Unable to assess (comment)(Patient having issues using limbs.)   Oral / Motor  Oral Motor/Sensory Function Overall Oral Motor/Sensory Function: Within functional limits Motor Speech Overall Motor Speech: Appears within functional limits for tasks assessed Respiration: Within functional limits Phonation: Normal Resonance: Within functional limits Articulation: Within functional limitis Intelligibility: Intelligible Motor Planning: Witnin functional limits Motor Speech Errors: Not applicable   GO                   Dimas Aguas, MA, CCC-SLP Acute Rehab SLP [phone] [place] 06/08/2018, 12:01 PM

## 2018-06-08 NOTE — Progress Notes (Signed)
PROGRESS NOTE  DEAVEON SCHOEN  ZOX:[id number] DOB: 01/24/1946 DOA: 05/28/2018 PCP: Mliss Sax, MD   Brief Narrative: [name] medical history significant forchronic kidney disease stage III and cervical stenosis with myelopathy status post cervical laminectomy on 05/23/2018, now presenting to the emergency department for evaluation of chest pain. the chest pain is substernal, associated with change in the position, worse when sitting up and better laying down. He also reports left shoulder pain and unable to lift the left shoulder from pain.  Work-up significant for left bundle branch block, no old EKG to compare, and 2D echo with EF 35% with global hypokinesis. Subsequent stress testing showed intermediate risk, so cardiac catheterization was performed 9/5 showing LAD and OM2 stenosis with no intervention. The patient developed altered mental status and aphasia shortly thereafter and was found to have a left temporal lobe infarct. Neurology was consulted. Carotid U/S demonstrated significant bilateral ICA stenoses (80-99% on right, complete occlusion on left) for which vascular surgery was consulted. Plan is for consideration of carotid artery stenting on the right. Aspirin and plavix in addition to statin have been started. Continues on imdur for antianginal reasons, though further therapy hampered by bradycardia, renal insufficiency, and need to avoid hypotension with ICA stenosis.   Assessment & Plan: Principal Problem:   Intractable pain Active Problems:   Cervical myelopathy (HCC)   Chest pain   CKD (chronic kidney disease), stage III (HCC)   Esophageal thickening   LBBB (left bundle branch block)   Postoperative anemia   DCM (dilated cardiomyopathy) (HCC)   Acute systolic CHF (congestive heart failure) (HCC)   Coronary artery disease due to lipid rich plaque  Atypical chest pain, CAD: With left shoulder pain, possibly related to  esophageal reflux/spasm and/or MSK with recent cervical spine surgery vs. cardiac ischemia with LBBB and unknown chronicity. Troponins negative, echo w/EF 35-40%, diffuse hypokinesis, also has evidence of esophageal dysmotility on barium study. Stress test 9/4 was intermediate risk, LHC 9/5 showed 75% stenosis to proximal LAD, 75% to OM2 with medical management recommended, consider PCI if angina is refractory to optimum medical therapy.  - Statin, aspirin, now plavix, not on BB due to bradycardia. - Imdur started at low dose. Will increase dose modestly today given continued MAP >95.  Acute bilateral CVA: Initially felt to be embolic, though with critical bilateral carotid stenosis and hypotension as well as watershed distribution of most infarcts, ICA stenosis is felt to be etiology. Locations include periventricular left M1/M2 distribution, right MCA/PCA distribution, and right temporoparietal cortex with resultant confusion and aphasia.   - On antiplatelet per neurology, statin - HbA1c 6.1%, will need PCP follow up. Consider metformin based on CrCl.   Bilateral carotid artery stenosis: Right ICA severely occluded (80-99%) and left ICA said to be completely occluded.  - Ideally 130-165mmHg SBP  - Vascular surgery consulted. [name] feels recent cervical fusion makes the patient a poor CEA candidate, but will discuss with partners regarding stenting of right ICA. Per neurology, this should occur within 2 weeks.  - ASA, plavix, statin   Vomiting, possible aspiration:  - SLP evaluation - Monitor fever curve, low threshold for culture, CXR.  Dilated cardiomyopathy: Clinically euvolemic.  - Would benefit from guideline-directed therapy, but Cr up so ARB stopped (just transitioned to losartan). Bradycardia limiting beta blocker therapy.  - No need for diuretic at this time. Will stop IVF  Hyperlipidemia: LDL 93.  - Started statin for secondary prevention.  Esophagitis/esophageal  dysmotility: CTA shows circumferential thickening of the esophagus. GI consulted for recommendations, underwent barium swallow study showing moderate dysmotility.  - PPI per GI, will need outpatient GI f/u for screening colo   Cervical myelopathy: s/p laminectomy on 8/23. - Continue cervical collar.  - Plan dispo to CIR once work up and management completed. - Neurosurgery ordered neurontin and steroid course (has been completed).  AKI on stage 3 CKD: - Will stop IVF given cardiomyopathy and restart of diet. Monitor intake today and restart if poor.  - Stopped ARB - Outpatient follow up with PCP. - Monitor BMP  Hyperkalemia: Most likely due to ARB initiation. Resolved. - Continue patiromer daily - Stopped ARB - Continue telemetry - Monitor K, Cr daily  Mild normocytic anemia: Hemoglobin stable around 10.  - Monitor  Constipation: Resolved - Continue bowel program  DVT prophylaxis: Heparin subcutaneous. Code Status: Full Family Communication: Sister at bedside Disposition Plan: CIR remains the planned disposition. Will need to discuss with vascular surgery regarding timing of carotid stenting.  Consultants:   PM&R  GI  Cardiology  Neurology  Vascular surgery  Procedures:  2D echo 05/29/2018 Study Conclusions  - Left ventricle: The cavity size was normal. Systolic function was moderately reduced. The estimated ejection fraction was in the range of 35% to 40%. Diffuse hypokinesis. Doppler parameters are consistent with abnormal left ventricular relaxation (grade 1 diastolic dysfunction). Doppler parameters are consistent with elevated ventricular end-diastolic filling pressure. - Aortic valve: A bicuspid morphology cannot be excluded; severely thickened, severely calcified leaflets. There was trivial regurgitation. - Mitral valve: There was mild regurgitation. - Left atrium: The atrium was normal in size. - Right atrium: The atrium was normal  in size. - Tricuspid valve: There was trivial regurgitation. - Pulmonary arteries: Systolic pressure was within the normal range. - Inferior vena cava: The vessel was dilated. The respirophasic diameter changes were blunted (<50%), consistent with elevated central venous pressure. - Pericardium, extracardiac: There was no pericardial effusion.  Antimicrobials:  None   Subjective: Had an episode while being fed by his sister this morning that ended with vomiting stomach contents, unsure whether he choked. Continues to have poor mentation, worse than baseline, not appreciably changed from the past 48 hours.   Objective: Vitals:   06/08/18 0000 06/08/18 0558 06/08/18 0600 06/08/18 0838  BP: 128/65 (!) 146/60 132/60 (!) 152/72  Pulse:  62  73  Resp:  19 18 16   Temp:  98.3 F (36.8 C)  97.8 F (36.6 C)  TempSrc:  Oral  Oral  SpO2:  97%  95%  Weight:      Height:        Intake/Output Summary (Last 24 hours) at 06/08/2018 1258 Last data filed at 06/08/2018 0558 Gross per 24 hour  Intake 920 ml  Output 2251 ml  Net -1331 ml   Filed Weights   05/31/18 0425 06/06/18 0432 06/06/18 0605  Weight: 73.4 kg 73.4 kg 74.7 kg   Gen: 72 y.o. male in no distress HEENT: Neck collar Pulm: Nonlabored breathing room air. Clear, no rhonchi, wheezes, crackles. CV: Regular rate and rhythm. No murmur, rub, or gallop. No JVD, no dependent edema. GI: Abdomen soft, non-tender, non-distended, with normoactive bowel sounds.  Ext: Warm, no deformities Skin: No rashes, lesions or ulcers on visualized skin.  Neuro: Alert and confused, poor attention and recall. Difficulty lifting left shoulder which is stable. No new cranial nerve deficits. Psych: Judgement and insight appear impaired. Mood euthymic & affect congruent. Behavior is  appropriate.    Data Reviewed: I have personally reviewed following labs and imaging studies  CBC: Recent Labs  Lab 06/04/18 0340 06/06/18 0905 06/07/18 0226  WBC  11.7* 11.8* 9.1  HGB 10.6* 10.5* 9.5*  HCT 32.5* 32.9* 30.0*  MCV 91.3 92.9 92.0  PLT 239 199 176   Basic Metabolic Panel: Recent Labs  Lab 06/04/18 0340 06/05/18 0349 06/06/18 0807 06/07/18 0226 06/08/18 0433  NA 135 135 139 136 136  K 5.2* 4.9 5.7* 4.4 4.4  CL 102 104 107 104 104  CO2 26 24 26 24 22   GLUCOSE 160* 135* 95 116* 111*  BUN 36* 37* 28* 29* 23  CREATININE 1.83* 1.72* 1.59* 1.70* 1.46*  CALCIUM 8.3* 8.0* 8.7* 8.3* 8.7*   GFR: Estimated Creatinine Clearance: 42.9 mL/min (A) (by C-G formula based on SCr of 1.46 mg/dL (H)). Liver Function Tests: No results for input(s): AST, ALT, ALKPHOS, BILITOT, PROT, ALBUMIN in the last 168 hours. No results for input(s): LIPASE, AMYLASE in the last 168 hours. No results for input(s): AMMONIA in the last 168 hours. Coagulation Profile: No results for input(s): INR, PROTIME in the last 168 hours. Cardiac Enzymes: No results for input(s): CKTOTAL, CKMB, CKMBINDEX, TROPONINI in the last 168 hours. BNP (last 3 results) No results for input(s): PROBNP in the last 8760 hours. HbA1C: Recent Labs    06/06/18 0858  HGBA1C 6.1*   CBG: Recent Labs  Lab 06/06/18 0508  GLUCAP 92   Lipid Profile: Recent Labs    06/07/18 0226  CHOL 155  HDL 31*  LDLCALC 93  TRIG 947*  CHOLHDL 5.0   Thyroid Function Tests: No results for input(s): TSH, T4TOTAL, FREET4, T3FREE, THYROIDAB in the last 72 hours. Anemia Panel: No results for input(s): VITAMINB12, FOLATE, FERRITIN, TIBC, IRON, RETICCTPCT in the last 72 hours. Urine analysis:    Component Value Date/Time   COLORURINE STRAW (A) 05/26/2018 0724   APPEARANCEUR CLEAR 05/26/2018 0724   LABSPEC 1.013 05/26/2018 0724   PHURINE 6.0 05/26/2018 0724   GLUCOSEU NEGATIVE 05/26/2018 0724   HGBUR NEGATIVE 05/26/2018 0724   BILIRUBINUR NEGATIVE 05/26/2018 0724   KETONESUR NEGATIVE 05/26/2018 0724   PROTEINUR NEGATIVE 05/26/2018 0724   UROBILINOGEN 0.2 06/25/2011 1027   NITRITE NEGATIVE  05/26/2018 0724   LEUKOCYTESUR NEGATIVE 05/26/2018 0724   No results found for this or any previous visit (from the past 240 hour(s)).    Radiology Studies: Mr Brain 45 Contrast  Result Date: 06/06/2018 CLINICAL DATA:  72 y/o M; acute altered mental status and right-sided weakness. Stroke evaluation. EXAM: MRI HEAD WITHOUT CONTRAST MRA HEAD WITHOUT CONTRAST TECHNIQUE: Axial DWI, coronal DWI, axial T2 FLAIR sequences of the head were acquired. Angiographic images of the head were obtained using MRA technique without contrast. The patient was unable to continue and additional sequences were not acquired. COMPARISON:  06/06/2018 CT head.  05/28/2018 CT cervical spine. FINDINGS: MRI HEAD FINDINGS Brain: Subcentimeter foci of reduced diffusion compatible with acute/early subacute infarction are present, 2 in the left superior frontal lobe, and 2 in the right posterior lateral parietal lobe. No signal abnormality in the left anterior temporal lobe, findings on CT likely related to artifact. Several nonspecific T2 FLAIR hyperintensities in subcortical and periventricular white matter are compatible with mild chronic microvascular ischemic changes for age. Mild volume loss of the brain. No hydrocephalus, extra-axial collection, or herniation. Vascular: As below. Skull and upper cervical spine: Partially visualized cervical spinal fusion and laminectomy. Large fluid collection within the postoperative bed  as seen on prior CT of the cervical spine. No reduced diffusion of the visible portions of the fluid collection to indicate infection. Sinuses/Orbits: Negative. Other: None. MRA HEAD FINDINGS Motion degraded study. Anterior circulation: Minimal flow related signal within the left cervical internal carotid artery and no appreciable flow related signal of the left petrous and cavernous segments of the left internal carotid artery. Faint flow related signal within the terminal left ICA, probably retrograde. Right  internal carotid artery is patent, moderate to severe stenosis of the right cavernous segment. Normal flow related signal within the right MCA and bilateral ACA distributions. Large anterior communicating artery largely perfusing the left MCA distribution which is patent. Asymmetry in the MCA distribution signal, left less than right, is likely due to signal loss from delayed transit via A-comm collateralization. Posterior circulation: Faint flow related signal in the left posterior cerebral artery which appears to be a fetal PCA is likely due to signal loss from delayed transit due to A-comm collateralization from the right ICA. Normal flow related signal within the vertebral arteries, basilar artery, and right PCA. IMPRESSION: MRI head: 1. Diffusion and T2 FLAIR weighted sequences were acquired. The patient was unable to continue and additional sequences were not acquired. 2. Subcentimeter acute/early subacute infarctions, 2 in the left superior frontal lobe, and 2 in the right posterolateral parietal lobe. No associated mass effect. 3. Mild chronic microvascular ischemic changes and volume loss of the brain. MRA head: 1. Extensive motion artifact. 2. Minimal flow related signal in the left cervical ICA and absent flow related signal within the petrous and cavernous segments of left ICA. Findings may represent occlusion or proximal high-grade stenosis. Age indeterminate. 3. Large anterior communicating artery providing collateral circulation to the left MCA and left PCA distributions. Decreased signal in left MCA and left PCA distribution from increased transit time. CTA of the head and neck can better assess vessel stenosis and patency. If clinically indicated consider CT perfusion of the head to assess for brain oligemia. These results will be called to the ordering clinician or representative by the Radiologist Assistant, and communication documented in the PACS or zVision Dashboard. Electronically Signed   By:  Mitzi Hansen M.D.   On: 06/06/2018 18:14   [name]  Result Date: 06/06/2018 CLINICAL DATA:  72 y/o M; acute altered mental status and right-sided weakness. Stroke evaluation. EXAM: MRI HEAD WITHOUT CONTRAST MRA HEAD WITHOUT CONTRAST TECHNIQUE: Axial DWI, coronal DWI, axial T2 FLAIR sequences of the head were acquired. Angiographic images of the head were obtained using MRA technique without contrast. The patient was unable to continue and additional sequences were not acquired. COMPARISON:  06/06/2018 CT head.  05/28/2018 CT cervical spine. FINDINGS: MRI HEAD FINDINGS Brain: Subcentimeter foci of reduced diffusion compatible with acute/early subacute infarction are present, 2 in the left superior frontal lobe, and 2 in the right posterior lateral parietal lobe. No signal abnormality in the left anterior temporal lobe, findings on CT likely related to artifact. Several nonspecific T2 FLAIR hyperintensities in subcortical and periventricular white matter are compatible with mild chronic microvascular ischemic changes for age. Mild volume loss of the brain. No hydrocephalus, extra-axial collection, or herniation. Vascular: As below. Skull and upper cervical spine: Partially visualized cervical spinal fusion and laminectomy. Large fluid collection within the postoperative bed as seen on prior CT of the cervical spine. No reduced diffusion of the visible portions of the fluid collection to indicate infection. Sinuses/Orbits: Negative. Other: None. MRA HEAD FINDINGS  Motion degraded study. Anterior circulation: Minimal flow related signal within the left cervical internal carotid artery and no appreciable flow related signal of the left petrous and cavernous segments of the left internal carotid artery. Faint flow related signal within the terminal left ICA, probably retrograde. Right internal carotid artery is patent, moderate to severe stenosis of the right cavernous segment. Normal flow  related signal within the right MCA and bilateral ACA distributions. Large anterior communicating artery largely perfusing the left MCA distribution which is patent. Asymmetry in the MCA distribution signal, left less than right, is likely due to signal loss from delayed transit via A-comm collateralization. Posterior circulation: Faint flow related signal in the left posterior cerebral artery which appears to be a fetal PCA is likely due to signal loss from delayed transit due to A-comm collateralization from the right ICA. Normal flow related signal within the vertebral arteries, basilar artery, and right PCA. IMPRESSION: MRI head: 1. Diffusion and T2 FLAIR weighted sequences were acquired. The patient was unable to continue and additional sequences were not acquired. 2. Subcentimeter acute/early subacute infarctions, 2 in the left superior frontal lobe, and 2 in the right posterolateral parietal lobe. No associated mass effect. 3. Mild chronic microvascular ischemic changes and volume loss of the brain. MRA head: 1. Extensive motion artifact. 2. Minimal flow related signal in the left cervical ICA and absent flow related signal within the petrous and cavernous segments of left ICA. Findings may represent occlusion or proximal high-grade stenosis. Age indeterminate. 3. Large anterior communicating artery providing collateral circulation to the left MCA and left PCA distributions. Decreased signal in left MCA and left PCA distribution from increased transit time. CTA of the head and neck can better assess vessel stenosis and patency. If clinically indicated consider CT perfusion of the head to assess for brain oligemia. These results will be called to the ordering clinician or representative by the Radiologist Assistant, and communication documented in the PACS or zVision Dashboard. Electronically Signed   By: Mitzi Hansen M.D.   On: 06/06/2018 18:14    Scheduled Meds: .  stroke: mapping our early  stages of recovery book   Does not apply Once  . aspirin EC  81 mg Oral Daily  . atorvastatin  80 mg Oral q1800  . clopidogrel  75 mg Oral Daily  . enoxaparin (LOVENOX) injection  40 mg Subcutaneous Q24H  . gabapentin  300 mg Oral TID  . iopamidol  100 mL Intravenous Once  . isosorbide mononitrate  15 mg Oral Daily  . pantoprazole  40 mg Oral BID  . patiromer  16.8 g Oral Daily  . senna-docusate  2 tablet Oral BID  . sodium chloride flush  3 mL Intravenous Q12H   Continuous Infusions: . sodium chloride       LOS: 4 days   Time spent: 25 minutes.  Tyrone Nine, MD [facility] [email or web address] Password [facility] 06/08/2018, 12:58 PM

## 2018-06-09 ENCOUNTER — Inpatient Hospital Stay (HOSPITAL_COMMUNITY): Payer: BLUE CROSS/BLUE SHIELD

## 2018-06-09 ENCOUNTER — Inpatient Hospital Stay (HOSPITAL_COMMUNITY): Payer: BLUE CROSS/BLUE SHIELD | Admitting: Certified Registered"

## 2018-06-09 DIAGNOSIS — N170 Acute kidney failure with tubular necrosis: Secondary | ICD-10-CM

## 2018-06-09 DIAGNOSIS — N183 Chronic kidney disease, stage 3 (moderate): Secondary | ICD-10-CM

## 2018-06-09 DIAGNOSIS — R092 Respiratory arrest: Secondary | ICD-10-CM

## 2018-06-09 DIAGNOSIS — I639 Cerebral infarction, unspecified: Secondary | ICD-10-CM

## 2018-06-09 DIAGNOSIS — I6523 Occlusion and stenosis of bilateral carotid arteries: Secondary | ICD-10-CM

## 2018-06-09 DIAGNOSIS — I6529 Occlusion and stenosis of unspecified carotid artery: Secondary | ICD-10-CM

## 2018-06-09 LAB — BASIC METABOLIC PANEL
ANION GAP: 12 (ref 5–15)
BUN: 26 mg/dL — ABNORMAL HIGH (ref 8–23)
CALCIUM: 9.2 mg/dL (ref 8.9–10.3)
CO2: 21 mmol/L — ABNORMAL LOW (ref 22–32)
Chloride: 103 mmol/L (ref 98–111)
Creatinine, Ser: 1.54 mg/dL — ABNORMAL HIGH (ref 0.61–1.24)
GFR, EST AFRICAN AMERICAN: 50 mL/min — AB (ref >60)
GFR, EST NON AFRICAN AMERICAN: 43 mL/min — AB (ref >60)
Glucose, Bld: 93 mg/dL (ref 70–99)
Potassium: 4.4 mmol/L (ref 3.5–5.1)
Sodium: 136 mmol/L (ref 135–145)

## 2018-06-09 LAB — CBC
HCT: 34.5 % — ABNORMAL LOW (ref 39.0–52.0)
HEMOGLOBIN: 11.5 g/dL — AB (ref 13.0–17.0)
MCH: 29.6 pg (ref 26.0–34.0)
MCHC: 33.3 g/dL (ref 30.0–36.0)
MCV: 88.7 fL (ref 78.0–100.0)
Platelets: 191 10*3/uL (ref 150–400)
RBC: 3.89 MIL/uL — AB (ref 4.22–5.81)
RDW: 12 % (ref 11.5–15.5)
WBC: 11.5 10*3/uL — AB (ref 4.0–10.5)

## 2018-06-09 LAB — BLOOD GAS, ARTERIAL
Acid-base deficit: 2.3 mmol/L — ABNORMAL HIGH (ref 0.0–2.0)
BICARBONATE: 21.5 mmol/L (ref 20.0–28.0)
Drawn by: 331761
FIO2: 100
LHR: 15 {breaths}/min
O2 Saturation: 99.9 %
PEEP: 5 cmH2O
Patient temperature: 98.6
VT: 540 mL
pCO2 arterial: 34.1 mmHg (ref 32.0–48.0)
pH, Arterial: 7.417 (ref 7.350–7.450)
pO2, Arterial: 360 mmHg — ABNORMAL HIGH (ref 83.0–108.0)

## 2018-06-09 LAB — GLUCOSE, CAPILLARY: GLUCOSE-CAPILLARY: 170 mg/dL — AB (ref 70–99)

## 2018-06-09 MED ORDER — ORAL CARE MOUTH RINSE: 15.0000 mL | Freq: Two times a day (BID) | OROMUCOSAL | Status: DC

## 2018-06-09 MED ORDER — SODIUM CHLORIDE 0.9% FLUSH
10.0000 mL | Freq: Two times a day (BID) | INTRAVENOUS | Status: DC
  Administered 2018-06-09 – 2018-06-13 (×5): 10 mL
  Administered 2018-06-14: 30 mL

## 2018-06-09 MED ORDER — CHLORHEXIDINE GLUCONATE 0.12% ORAL RINSE (MEDLINE KIT)
15.0000 mL | Freq: Two times a day (BID) | OROMUCOSAL | Status: DC
  Administered 2018-06-09 – 2018-06-18 (×14): 15 mL via OROMUCOSAL

## 2018-06-09 MED ORDER — PROPOFOL 1000 MG/100ML IV EMUL
0.0000 ug/kg/min | INTRAVENOUS | Status: DC
  Administered 2018-06-09: 25 ug/kg/min via INTRAVENOUS
  Filled 2018-06-09: qty 100

## 2018-06-09 MED ORDER — ASPIRIN 81 MG PO CHEW: 81.0000 mg | CHEWABLE_TABLET | Freq: Every day | ORAL | Status: DC

## 2018-06-09 MED ORDER — OLANZAPINE 2.5 MG PO TABS
2.5000 mg | ORAL_TABLET | Freq: Every day | ORAL | Status: DC
  Filled 2018-06-09: qty 1

## 2018-06-09 MED ORDER — INSULIN ASPART 100 UNIT/ML ~~LOC~~ SOLN
0.0000 [IU] | SUBCUTANEOUS | Status: DC
  Administered 2018-06-09 – 2018-06-11 (×5): 2 [IU] via SUBCUTANEOUS
  Administered 2018-06-12 (×2): 3 [IU] via SUBCUTANEOUS
  Administered 2018-06-12: 8 [IU] via SUBCUTANEOUS
  Administered 2018-06-12: 5 [IU] via SUBCUTANEOUS
  Administered 2018-06-12 – 2018-06-14 (×3): 2 [IU] via SUBCUTANEOUS
  Administered 2018-06-14: 1 [IU] via SUBCUTANEOUS
  Administered 2018-06-14 – 2018-06-15 (×3): 2 [IU] via SUBCUTANEOUS
  Administered 2018-06-15: 3 [IU] via SUBCUTANEOUS
  Administered 2018-06-15 (×2): 2 [IU] via SUBCUTANEOUS
  Administered 2018-06-16: 3 [IU] via SUBCUTANEOUS
  Administered 2018-06-16: 2 [IU] via SUBCUTANEOUS
  Administered 2018-06-17: 3 [IU] via SUBCUTANEOUS
  Administered 2018-06-17 – 2018-06-18 (×3): 2 [IU] via SUBCUTANEOUS
  Administered 2018-06-19 (×2): 3 [IU] via SUBCUTANEOUS
  Administered 2018-06-19: 2 [IU] via SUBCUTANEOUS

## 2018-06-09 MED ORDER — FAMOTIDINE IN NACL 20-0.9 MG/50ML-% IV SOLN
20.0000 mg | INTRAVENOUS | Status: DC
  Administered 2018-06-09 – 2018-06-18 (×10): 20 mg via INTRAVENOUS
  Filled 2018-06-09 (×10): qty 50

## 2018-06-09 MED ORDER — SODIUM CHLORIDE 0.9 % IV SOLN
INTRAVENOUS | Status: DC
  Administered 2018-06-09 – 2018-06-19 (×11): via INTRAVENOUS

## 2018-06-09 MED ORDER — FENTANYL CITRATE (PF) 100 MCG/2ML IJ SOLN
50.0000 ug | INTRAMUSCULAR | Status: DC | PRN
  Administered 2018-06-09 – 2018-06-12 (×7): 50 ug via INTRAVENOUS
  Filled 2018-06-09 (×5): qty 2

## 2018-06-09 MED ORDER — ORAL CARE MOUTH RINSE
15.0000 mL | OROMUCOSAL | Status: DC
  Administered 2018-06-09 – 2018-06-12 (×23): 15 mL via OROMUCOSAL

## 2018-06-09 MED ORDER — ETOMIDATE 2 MG/ML IV SOLN
INTRAVENOUS | Status: DC | PRN
  Administered 2018-06-09: 12 mg via INTRAVENOUS

## 2018-06-09 MED ORDER — SUCCINYLCHOLINE CHLORIDE 20 MG/ML IJ SOLN
INTRAMUSCULAR | Status: DC | PRN
  Administered 2018-06-09: 100 mg via INTRAVENOUS

## 2018-06-09 MED ORDER — CLOPIDOGREL BISULFATE 75 MG PO TABS
75.0000 mg | ORAL_TABLET | Freq: Every day | ORAL | Status: DC
  Filled 2018-06-09: qty 1

## 2018-06-09 MED ORDER — OLANZAPINE 2.5 MG PO TABS: 2.5000 mg | ORAL_TABLET | Freq: Every day | ORAL | Status: DC

## 2018-06-09 MED ORDER — NOREPINEPHRINE 4 MG/250ML-% IV SOLN
0.0000 ug/min | INTRAVENOUS | Status: DC
  Administered 2018-06-09: 2 ug/min via INTRAVENOUS
  Administered 2018-06-10: 5 ug/min via INTRAVENOUS
  Administered 2018-06-10: 7 ug/min via INTRAVENOUS
  Administered 2018-06-11 (×2): 5 ug/min via INTRAVENOUS
  Filled 2018-06-09 (×7): qty 250

## 2018-06-09 MED ORDER — ASPIRIN 81 MG PO CHEW
81.0000 mg | CHEWABLE_TABLET | Freq: Every day | ORAL | Status: DC
  Filled 2018-06-09: qty 1

## 2018-06-09 MED ORDER — CHLORHEXIDINE GLUCONATE CLOTH 2 % EX PADS
6.0000 | MEDICATED_PAD | Freq: Every day | CUTANEOUS | Status: DC
  Administered 2018-06-09 – 2018-06-13 (×4): 6 via TOPICAL

## 2018-06-09 MED ORDER — FENTANYL CITRATE (PF) 100 MCG/2ML IJ SOLN
50.0000 ug | INTRAMUSCULAR | Status: AC | PRN
  Administered 2018-06-09 – 2018-06-10 (×3): 50 ug via INTRAVENOUS
  Filled 2018-06-09 (×5): qty 2

## 2018-06-09 MED ORDER — SODIUM CHLORIDE 0.9% FLUSH
10.0000 mL | INTRAVENOUS | Status: DC | PRN
  Administered 2018-06-10: 10 mL
  Filled 2018-06-09: qty 40

## 2018-06-09 MED ORDER — ATORVASTATIN CALCIUM 80 MG PO TABS
80.0000 mg | ORAL_TABLET | Freq: Every day | ORAL | Status: DC
  Administered 2018-06-09 – 2018-06-18 (×9): 80 mg
  Filled 2018-06-09 (×9): qty 1

## 2018-06-09 MED ORDER — CHLORHEXIDINE GLUCONATE 0.12 % MT SOLN
15.0000 mL | Freq: Two times a day (BID) | OROMUCOSAL | Status: DC
  Administered 2018-06-10: 15 mL via OROMUCOSAL

## 2018-06-09 MED FILL — Medication: Qty: 1 | Status: AC

## 2018-06-09 NOTE — Code Documentation (Signed)
CODE BLUE NOTE  Patient Name: Ian Ramos   MRN: [id number]   Date of Birth/ Sex: 07-Jan-1946 , male      Admission Date: 05/28/2018  Attending Provider: Tyrone Nine, MD  Primary Diagnosis: CAD, chest pain, multifocal CVA, acute encephalopathy, carotid stenosis, s/p cervical fusion.   Indication: See PN for context, pt at baseline this afternoon when RN came in to find pt hypotensive, unresponsive. Do not believe pt lost pulse, but was not breathing. Code blue was subsequently called. I was paged and arrived shortly thereafter. At the time of arrival on scene, ACLS protocol was underway. Pulse check and rhythm analysis demonstrated palpable pulse with sinus bradycardia in 40-50's. Pacers had been applied, but no cardioversion/pacing required. Atropine given with resultant sinus tachycardia. Anesthesia arrived and after bagging, intubated the patient. Initiated transfer to ICU. I spoke with CCM MD who is in 2 heart, where the patient is transferred, and will assume care. Spoke with pt's sister at bedside, all questions answered.  Tyrone Nine, MD  06/09/2018, 3:02 PM

## 2018-06-09 NOTE — Consult Note (Addendum)
Ian Ramos  GNF:[id number] DOB: [birth year] DOA: 05/28/2018 PCP: Mliss Sax, MD    LOS: 5 days   Reason for Consult / Chief Complaint:  Respiratory arrest   Consulting MD and date:  gruntz   HPI/Summary of hospital stay:  72 year old male patient history of stage III chronic kidney disease, cervical spine stenosis status post recent cervical laminectomy August 2019.  Presented to the emergency room on 8/28 with chief complaint of chest pain, shoulder pain and substernal discomfort..  Was found to have some esophageal thickening, possibly some esophageal dysmotility, etiology was never clearly identified but resolved.  He was found to have new dilated cardiomyopathy with EF 35% and diffuse hypokinesis, and bundle branch block, but it was unclear as to whether or not if this was the primary cause of his discomfort.  The plan was to proceed with iliac ischemia evaluation.  He underwent cardiac catheterization on 9/5: Showed moderate obstructive coronary artery disease with 75% occluded proximal LAD 75% OM 2 occluded RV marginal branch with left to right collaterals.  This was not considered critical CAD and medical management was recommended. On 9/6 the patient developed acute mental status change.  On initial eval he was confused, preservation, and had right upper extremity drift and right visual field cut.  CT brain history left temporal lobe infarct felt possibly embolic in etiology.  Carotid ultrasound showed likely left carotid occlusion with high-grade right carotid stenosis.  Vascular surgery was consulted.  Plavix was recommended and delaying carotid endarterectomy for further recovery was felt best course of action. 9/7: Continues to demonstrate aphasia.  Also with ongoing left upper extremity weakness.  MRI showed right MCA/PCA watershed and right parietal punctate infarcts felt more consistent with high-grade ICA stenosis on the left. 9/8.  Patient vomited twice during  the day while family was assisting with feeding 9/9: Found unresponsive in chair.  Blood pressure 48/32 heart rate 58 area not clear that he lost pulses.  Pulse oximetry not recorded.  He was apneic, Code blue was called patient was intubated and transferred to the intensive care.  Subjective:  Appears comfortable on the vent  Objective   Blood pressure 116/88, pulse 93, temperature 98.4 F (36.9 C), temperature source Oral, resp. rate 18, height 5' 7.5" (1.715 m), weight 74.7 kg, SpO2 100 %.    Vent Mode: PRVC FiO2 (%):  [100 %] 100 % Set Rate:  [15 bmp] 15 bmp Vt Set:  [540 mL] 540 mL PEEP:  [5 cmH20] 5 cmH20 Plateau Pressure:  [13 cmH20] 13 cmH20   Intake/Output Summary (Last 24 hours) at 06/09/2018 1549 Last data filed at 06/09/2018 1538 Gross per 24 hour  Intake 240 ml  Output 1265 ml  Net -1025 ml   Filed Weights   05/31/18 0425 06/06/18 0432 06/06/18 0605  Weight: 73.4 kg 73.4 kg 74.7 kg    Examination: General: Likely ill-appearing 72 year old white male currently sedated HENT: C-collar in place mucous membranes moist orally intubated Lungs: Scattered rhonchi equal chest rise Cardiovascular: Regular rate and rhythm no murmur rub or gallop Abdomen: Nontender Extremities: Warm, dry no significant edema Neuro: Generalized weakness left greater than right GU: Very yellow  Consults: date of consult/date signed off & final recs:  GI 8/29: Found asymptomatic esophagitis.  Also evidence of esophageal dysmotility on esophagram.  GI signed off on 8/30 with final recommendations for daily PPI x4 weeks and routine cancer screening Cardiology consulted 9/3 for chest pain.  Underwent left heart  cath which showed diffuse coronary artery disease but noncritical recommended medical therapy, because he had bradycardia and CKD ARB was held he was recommended to continue hydralazine, nitrates and diuretics 9/7: Neurology consulted for stroke Vascular surgery consulted 9/6: For bilateral  carotid artery disease: Started on Plavix.  Recommending gentle hydration.  Considering the patient for carotid stenting in the next 2 weeks Procedures:  Left heart cath 9/5 showing moderate noncritical coronary artery disease.  75% occluded proximal LAD 75% occluded OM.  Medical management was recommended   Significant Diagnostic Tests: Echocardiogram 8/29:EF 35 to 40%.  Diffuse hypokinesis.  Doppler parameters consistent with grade 1 diastolic dysfunction. 9/6: MRI brainExtensive motion artifact.  Minimal flow related signal in the left cervical ICA and absent flow of the left ICA representing occlusion or proximal high-grade stenosis.  There is a large anterior communicating artery providing collateral circulation to the left MCA and left PCA. 9/6: CT brain: Subtle hypodensity in the anterior mid left temporal lobe 9/4: Nuclear stress test EF 39% findings were consistent with ischemia felt to be intermittent risk 8/30: Esophagram showing moderate esophageal dysmotility  Micro Data: Respiratory culture 9/9>>>   Antimicrobials:  Vancomycin 9/9 Zosyn 9/9 Resolved Hospital Problem list    Assessment & Plan:   Acute hypoxic respiratory failure.  -Given recent history of stroke and radiographically identified esophageal dysmotility suspect likely aspiration event -Intubated by anesthesia -X-ray personally reviewed: Endotracheal tube in satisfactory position possibly some evolving right basilar airspace disease could result early aspiration Plan Stat chest x-ray Follow-up arterial blood gas Full ventilator support Sputum culture Hcap coverage empirically VAP bundle PAD protocol RASS goal -1  Dilated cardiomyopathy EF 39%, complicated by diffuse noncritical coronary artery disease, and bradycardia with hypotension  -Hypotension was new 9/9, may be polypharmacy -Post cardiac catheterization 9/5 Plan Medical management including aspirin Holding nitrate, hydralazine, and diuretics  for now Keep euvolemic Avoid beta-blockade due to bradycardia Start low-dose norepinephrine for BP goal greater than 130  Acute bilateral CVA.  Secondary to critical bilateral carotid stenosis exacerbated by hypotension.  Has watershed distribution infarcts. Plan Continuing antiplatelet and statin therapy Try to ensure systolic blood pressure 130-150 Possible ICA stenting if clinically improved and stabilized   Acute metabolic encephalopathy.  Has had waxing and waning delirium, felt exacerbated by sleep deprivation, recent stroke, and possibly toxic component from medications I also think some of this may be hypoperfusion and wonder if his event landing him in the intensive care was because of this Plan Supportive care DC Zyprexa RASS goal -1 PAD protocol Try PRN fent. precedex if needed   Acute on chronic kidney disease, CKD stage III at baseline -ARB stopped -Creatinine has been stable, it appears as though baseline is around 1.4-1.9 Plan Avoid hypotension Renal dose medications Strict intake output  Vomiting, has history of esophageal dysmotility identified by esophagram also CT showing circumferential thickening of the esophagus Plan N.p.o. for now Continue daily PPI Abdomen film  Anemia of chronic disease No evidence of bleeding Plan Trend CBC  Cervical myelopathy status post lumbar laminectomy  8/23 -Completed course of steroids per neurosurgery Plan Keep c-collar in place  Disposition / Summary of Today's Plan 06/09/18   Is a 72 year old male patient who initially came in with atypical chest pain.  He was identified as having noncritical coronary artery disease with new cardiomyopathy.  Unclear if this was for sure the etiology of his chest discomfort, however it was certainly a new finding.  We started medical management for this  the coronary artery disease, but also identified bilateral critical carotid artery disease.  He subsequently did suffer a stroke this  hospitalization, and we have identified him as being quite flow dependent in regards to his carotid arteries. Today  He was found unresponsive and hypotensive, I wonder how much of this was simply polypharmacy, further complicated by syncopal-like event from hypotension.  Cannot exclude an aspiration event.  At this point he is intubated.  The plan will be to treat him for a potential aspiration given his history of esophageal dysmotility and new stroke.  Will also support his blood pressure, and continue ongoing supportive neurological care.  If we can stabilize him hopefully he would be a candidate for carotid stenting in the next couple weeks  Best Practice / Goals of Care / Disposition.   DVT prophylaxis: LMWH GI prophylaxis: PPI VT  Diet: NPO, start tubefeeds Mobility:BR Code Status: full Family Communication: full   Labs   CBC: Recent Labs  Lab 06/04/18 0340 06/06/18 0905 06/07/18 0226 06/09/18 0401  WBC 11.7* 11.8* 9.1 11.5*  HGB 10.6* 10.5* 9.5* 11.5*  HCT 32.5* 32.9* 30.0* 34.5*  MCV 91.3 92.9 92.0 88.7  PLT 239 199 176 191   Basic Metabolic Panel: Recent Labs  Lab 06/05/18 0349 06/06/18 0807 06/07/18 0226 06/08/18 0433 06/09/18 0401  NA 135 139 136 136 136  K 4.9 5.7* 4.4 4.4 4.4  CL 104 107 104 104 103  CO2 24 26 24 22  21*  GLUCOSE 135* 95 116* 111* 93  BUN 37* 28* 29* 23 26*  CREATININE 1.72* 1.59* 1.70* 1.46* 1.54*  CALCIUM 8.0* 8.7* 8.3* 8.7* 9.2   GFR: Estimated Creatinine Clearance: 40.7 mL/min (A) (by C-G formula based on SCr of 1.54 mg/dL (H)). Recent Labs  Lab 06/04/18 0340 06/06/18 0905 06/07/18 0226 06/09/18 0401  WBC 11.7* 11.8* 9.1 11.5*   Liver Function Tests: No results for input(s): AST, ALT, ALKPHOS, BILITOT, PROT, ALBUMIN in the last 168 hours. No results for input(s): LIPASE, AMYLASE in the last 168 hours. No results for input(s): AMMONIA in the last 168 hours. ABG    Component Value Date/Time   TCO2 24 06/25/2011 1054      Coagulation Profile: No results for input(s): INR, PROTIME in the last 168 hours. Cardiac Enzymes: No results for input(s): CKTOTAL, CKMB, CKMBINDEX, TROPONINI in the last 168 hours. HbA1C: Hgb A1c MFr Bld  Date/Time Value Ref Range Status  06/06/2018 08:58 AM 6.1 (H) 4.8 - 5.6 % Final    Comment:    (NOTE) Pre diabetes:          5.7%-6.4% Diabetes:              >6.4% Glycemic control for   <7.0% adults with diabetes    CBG: Recent Labs  Lab 06/06/18 0508 06/09/18 1449  GLUCAP 92 170*     Review of Systems:    Past medical history  He,  has a past medical history of Arthritis and Neuromuscular disorder (HCC).   Surgical History    Past Surgical History:  Procedure Laterality Date  . LEFT HEART CATH AND CORONARY ANGIOGRAPHY N/A 06/05/2018   Procedure: LEFT HEART CATH AND CORONARY ANGIOGRAPHY;  Surgeon: [place], Sadik Piascik M, MD;  Location: [facility] INVASIVE CV LAB;  Service: Cardiovascular;  Laterality: N/A;  . POSTERIOR CERVICAL FUSION/FORAMINOTOMY N/A 05/23/2018   Procedure: Posterior Cervical Fusion with lateral mass fixation - C1 - C6 with laminectomy;  Surgeon: Julio Sicks, MD;  Location: [facility] OR;  Service:  Neurosurgery;  Laterality: N/A;     Social History   Social History   Socioeconomic History  . Marital status: Divorced    Spouse name: Not on file  . Number of children: Not on file  . Years of education: Not on file  . Highest education level: Not on file  Occupational History  . Not on file  Social Needs  . Financial resource strain: Not on file  . Food insecurity:    Worry: Not on file    Inability: Not on file  . Transportation needs:    Medical: Not on file    Non-medical: Not on file  Tobacco Use  . Smoking status: Never Smoker  . Smokeless tobacco: Current User    Types: Chew  Substance and Sexual Activity  . Alcohol use: No    Frequency: Never  . Drug use: No  . Sexual activity: Not on file  Lifestyle  . Physical activity:    Days per week: Not  on file    Minutes per session: Not on file  . Stress: Not on file  Relationships  . Social connections:    Talks on phone: Not on file    Gets together: Not on file    Attends religious service: Not on file    Active member of club or organization: Not on file    Attends meetings of clubs or organizations: Not on file    Relationship status: Not on file  . Intimate partner violence:    Fear of current or ex partner: Not on file    Emotionally abused: Not on file    Physically abused: Not on file    Forced sexual activity: Not on file  Other Topics Concern  . Not on file  Social History Narrative  . Not on file  ,  reports that he has never smoked. His smokeless tobacco use includes chew. He reports that he does not drink alcohol or use drugs.   Family history   His family history includes CAD in his father; Heart failure in his father and mother; Hypertension in his other.   Allergies No Known Allergies  Home meds  Prior to Admission medications   Medication Sig Start Date End Date Taking? Authorizing Provider  HYDROcodone-acetaminophen (NORCO) 10-325 MG tablet Take 1 tablet by mouth every 6 (six) hours as needed for moderate pain.   Yes [provider]  ibuprofen (ADVIL,MOTRIN) 200 MG tablet Take 400 mg by mouth every 6 (six) hours as needed for moderate pain.   Yes [provider]

## 2018-06-09 NOTE — Progress Notes (Signed)
Progress Note  Patient Name: Ian Ramos Date of Encounter: 06/09/2018  Primary Cardiologist:   No primary care provider on file.   Subjective   Mild shoulder pain, no chest pain.   Inpatient Medications    Scheduled Meds: .  stroke: mapping our early stages of recovery book   Does not apply Once  . aspirin EC  81 mg Oral Daily  . atorvastatin  80 mg Oral q1800  . clopidogrel  75 mg Oral Daily  . enoxaparin (LOVENOX) injection  40 mg Subcutaneous Q24H  . gabapentin  300 mg Oral TID  . iopamidol  100 mL Intravenous Once  . isosorbide mononitrate  30 mg Oral Daily  . pantoprazole  40 mg Oral BID  . patiromer  16.8 g Oral Daily  . senna-docusate  2 tablet Oral BID  . sodium chloride flush  3 mL Intravenous Q12H   Continuous Infusions: . sodium chloride     PRN Meds: sodium chloride, acetaminophen, ALPRAZolam, bisacodyl, gi cocktail, morphine injection, nitroGLYCERIN, ondansetron (ZOFRAN) IV, oxyCODONE-acetaminophen, senna, sodium chloride flush, zolpidem   Vital Signs    Vitals:   06/08/18 2005 06/08/18 2354 06/09/18 0503 06/09/18 0744  BP: 108/66 113/60 123/63 140/64  Pulse: 87 69 83   Resp: (!) 21 (!) 24 (!) 22 (!) 23  Temp: (!) 97.4 F (36.3 C) 98.4 F (36.9 C) 98.2 F (36.8 C) 98.4 F (36.9 C)  TempSrc: Oral Oral Oral Oral  SpO2: 96% 90% 95% (!) 88%  Weight:      Height:        Intake/Output Summary (Last 24 hours) at 06/09/2018 0954 Last data filed at 06/09/2018 0200 Gross per 24 hour  Intake 120 ml  Output 715 ml  Net -595 ml   Filed Weights   05/31/18 0425 06/06/18 0432 06/06/18 0605  Weight: 73.4 kg 73.4 kg 74.7 kg    Telemetry    NSR - Personally Reviewed  ECG    NA - Personally Reviewed  Physical Exam   GEN: Confused, not answering questions appropriately Neck: No  JVD Cardiac: RRR, no murmurs, rubs, or gallops.  Respiratory: Clear  to auscultation bilaterally. GI: Soft, nontender, non-distended  MS: No  edema; No  deformity. Neuro:  Nonfocal  Psych: Normal affect   Labs    Chemistry Recent Labs  Lab 06/07/18 0226 06/08/18 0433 06/09/18 0401  NA 136 136 136  K 4.4 4.4 4.4  CL 104 104 103  CO2 24 22 21*  GLUCOSE 116* 111* 93  BUN 29* 23 26*  CREATININE 1.70* 1.46* 1.54*  CALCIUM 8.3* 8.7* 9.2  GFRNONAA 38* 46* 43*  GFRAA 44* 53* 50*  ANIONGAP 8 10 12      Hematology Recent Labs  Lab 06/06/18 0905 06/07/18 0226 06/09/18 0401  WBC 11.8* 9.1 11.5*  RBC 3.54* 3.26* 3.89*  HGB 10.5* 9.5* 11.5*  HCT 32.9* 30.0* 34.5*  MCV 92.9 92.0 88.7  MCH 29.7 29.1 29.6  MCHC 31.9 31.7 33.3  RDW 12.3 12.1 12.0  PLT 199 176 191    Cardiac EnzymesNo results for input(s): TROPONINI in the last 168 hours. No results for input(s): TROPIPOC in the last 168 hours.   BNPNo results for input(s): BNP, PROBNP in the last 168 hours.   DDimer No results for input(s): DDIMER in the last 168 hours.   Radiology    No results found.  Cardiac Studies   Cardiac cath:  Left Anterior Descending  Prox LAD lesion 75% stenosed  Prox LAD lesion is 75% stenosed. The lesion is eccentric.  Second Septal Branch  Left Circumflex  Second Obtuse Marginal Branch  Ost 2nd Mrg to 2nd Mrg lesion 75% stenosed  Ost 2nd Mrg to 2nd Mrg lesion is 75% stenosed.  Right Coronary Artery  Prox RCA to Mid RCA lesion 30% stenosed  Prox RCA to Mid RCA lesion is 30% stenosed.  Acute Marginal Branch  Collaterals  Acute Mrg filled by collaterals from 2nd Sept.    Acute Mrg lesion 100% stenosed  Acute Mrg lesion is 100% stenosed. The lesion is chronically occluded with left-to-right collateral flow.     Patient Profile     72 y.o. male with a history of chronic kidney disease stage III and cervical disc disease who underwent recent cervical laminectomy on 05/23/2018 without any complications. On 05/28/2018 he awakened at 4 AM with severe substernal chest pain and tried to sit up the pain became more severe. There were no  associated symptoms of shortness of breath, diaphoresis or nausea. He called EMS and was given aspirin and chest pain resolved but he continued to have left shoulder pain although he says that he has intermittent shoulder pain chronically  Assessment & Plan    CHEST PAIN:   CAD as above with plans for optimal medical management with PCI if clear anginal refractory symptoms.   No change in therapy.  We will continue to follow    DILATED CM:   Therapy is limited because he hs had bradycardia and because he has CKD.  ARB held.  Continue hydralazine, nitrates and diuretic.  He is euvolemic.  Carotid stenosis: Dr Lynford Humphrey asked Dr Darrick Penna to evaluate him for possible carotid stenting.  If this is possible, neurology would like this done within the 2-week window. He was started on Plavix given the might be a candidate for carotid stenting.    Acute on chronic kidney insufficiency: Crea 1.7>1.4>1.5, gentle hydration is ok, we will follow for signs of fluid overload  For questions or updates, please contact [facility] Please consult [email or web address] for contact info under Cardiology/STEMI.   Signed, Tobias Alexander, MD  06/09/2018, 9:54 AM

## 2018-06-09 NOTE — Progress Notes (Signed)
Into assess patient. Patient asleep in bedside chair. Patient's BP 48/32, HR 58, RR 24. RR called. Dr. Karle Plumber notified and requested at bedside. Patient transferred to bed.   Code called at 1437 Compressions started.  RR, code team and Dr. Dillon Bjork at bedside 1440  1452 BP 118/68. Resuscitation ended. 1455 BP 116/74  Patient transferred to ICU

## 2018-06-09 NOTE — Progress Notes (Signed)
   06/09/18 1500  Clinical Encounter Type  Visited With Patient and family together  Visit Type Critical Care  Referral From Nurse  Chaplain responded to Code Blue. Sister was in the hall with Staff. Chaplain waited with sister and Staff was able to revive patient. Sister was happy that he pulled through. Per nurse the patient was transported to [facility] room 9 ICU. Chaplain escorted sister to 2H and helped her gather his belongings. Chaplain provided spiritual and emotional support.

## 2018-06-09 NOTE — Procedures (Signed)
Central Venous Catheter Insertion Procedure Note MORRILL STEENBERGEN [id number] Feb 18, 1946  Procedure: Insertion of Central Venous Catheter Indications: Assessment of intravascular volume, Drug and/or fluid administration and Frequent blood sampling  Procedure Details Consent: Risks of procedure as well as the alternatives and risks of each were explained to the (patient/caregiver).  Consent for procedure obtained. Time Out: Verified patient identification, verified procedure, site/side was marked, verified correct patient position, special equipment/implants available, medications/allergies/relevent history reviewed, required imaging and test results available.  Performed  Maximum sterile technique was used including antiseptics, cap, gloves, gown, hand hygiene, mask and sheet. Skin prep: Chlorhexidine; local anesthetic administered A antimicrobial bonded/coated triple lumen catheter was placed in the left subclavian vein using the Seldinger technique.  Evaluation Blood flow good Complications: No apparent complications Patient did tolerate procedure well. Chest X-ray ordered to verify placement.  CXR: pending.  Shelby Mattocks 06/09/2018, 5:51 PM  Simonne Martinet ACNP-BC [facility] Pulmonary/Critical Care Pager # [phone] OR # [phone] if no answer

## 2018-06-09 NOTE — Care Management Note (Signed)
Case Management Note Donn Pierini RN, BSN Unit 4E- RN Care Coordinator  [phone]  Patient Details  Name: BESNIK CHARACTER MRN: [id number] Date of Birth: 04/26/1946  Subjective/Objective:   Pt with recent cervical laminectomy 05/23/18- admitted with chest pain s/p cath on 06/05/18 showing LAD with no intervention. On 06/06/18 pt developed AMS and was found to have a left temporal lobe infarct, Neuro was consulted for stroke workup. Found to have significant carotid stenosis with vascular following for possible stenting.             Action/Plan: PTA Pt lived at home was not using assist device post surgery, Per PT/OT recommendations for CIR, CIR consult has been placed and they are following for possible admission when pt medically stable.   Expected Discharge Date:                  Expected Discharge Plan:  IP Rehab Facility  In-House Referral:  Clinical Social Work  Discharge planning Services  CM Consult  Post Acute Care Choice:    Choice offered to:     DME Arranged:    DME Agency:     HH Arranged:    HH Agency:     Status of Service:  In process, will continue to follow  If discussed at Long Length of Stay Meetings, dates discussed:    Discharge Disposition:    Additional Comments:  06/09/18- 1530- Donn Pierini RN, CM- pt with acute decline this afternoon after being found unresponsive in room- CODE BLUE called- pt intubated at the bedside and has been transferred to ICU for further care. CM will continue to follow for transition of care needs and ?CIR admission if pt stabilizes for admission to CIR.   Darrold Span, RN 06/09/2018, 3:43 PM

## 2018-06-09 NOTE — Progress Notes (Signed)
Patient with emesis x2 during the day. Both times the family were feeding the patient. In the morning the sister was feeding and patient had small amount of emesis. Informed the sister that the patient needed to be higher in the bed before attempting to feed. The patient was able to take morning medications without any issues. The second time was when the daughter was feeding the patient dinner. She stated that he only took two bites before he vomited. Again the patient was down in the bed.

## 2018-06-09 NOTE — Progress Notes (Signed)
   VASCULAR SURGERY ASSESSMENT & PLAN:   BILATERAL CAROTID DISEASE WITH BILATERAL STROKES: The patient has an occluded left internal carotid artery with a greater than 80% right carotid stenosis.  He has had some expressive aphasia and bilateral weakness and also confusion overnight.  His MRI shows 2 small infarcts on the left in the superior frontal lobe and 2 small infarcts in the right and the posterior lateral parietal lobe.  He had laminectomy at C1-C6 with diffuse neck.  He is in a soft collar.  He is not a candidate for carotid endarterectomy given his neck fusion and also his coronary artery disease.  He has a 75% stenosis in the proximal LAD.  I have asked Dr. Fabienne Bruns to evaluate him for possible carotid stenting.  If this is possible, neurology would like this done within the 2-week window.   I have started Plavix given the might be a candidate for carotid stenting.  He does have stage III chronic kidney disease but his creatinine is improving with gentle hydration.  SUBJECTIVE:   Pleasantly confused this morning.  PHYSICAL EXAM:   Vitals:   06/08/18 1611 06/08/18 2005 06/08/18 2354 06/09/18 0503  BP: (!) 141/99 108/66 113/60 123/63  Pulse: 70 87 69 83  Resp: 20 (!) 21 (!) 24 (!) 22  Temp: 98 F (36.7 C) (!) 97.4 F (36.3 C) 98.4 F (36.9 C) 98.2 F (36.8 C)  TempSrc: Oral Oral Oral Oral  SpO2: 100% 96% 90% 95%  Weight:      Height:       He seems to have some bilateral upper extremity weakness which is not new. He is confused.  LABS:   Lab Results  Component Value Date   WBC 11.5 (H) 06/09/2018   HGB 11.5 (L) 06/09/2018   HCT 34.5 (L) 06/09/2018   MCV 88.7 06/09/2018   PLT 191 06/09/2018   Lab Results  Component Value Date   CREATININE 1.54 (H) 06/09/2018    PROBLEM LIST:    Principal Problem:   Intractable pain Active Problems:   Cervical myelopathy (HCC)   Chest pain   CKD (chronic kidney disease), stage III (HCC)   Esophageal thickening  LBBB (left bundle branch block)   Postoperative anemia   DCM (dilated cardiomyopathy) (HCC)   Acute systolic CHF (congestive heart failure) (HCC)   Coronary artery disease due to lipid rich plaque   CURRENT MEDS:   .  stroke: mapping our early stages of recovery book   Does not apply Once  . aspirin EC  81 mg Oral Daily  . atorvastatin  80 mg Oral q1800  . clopidogrel  75 mg Oral Daily  . enoxaparin (LOVENOX) injection  40 mg Subcutaneous Q24H  . gabapentin  300 mg Oral TID  . iopamidol  100 mL Intravenous Once  . isosorbide mononitrate  30 mg Oral Daily  . pantoprazole  40 mg Oral BID  . patiromer  16.8 g Oral Daily  . senna-docusate  2 tablet Oral BID  . sodium chloride flush  3 mL Intravenous Q12H    Waverly Ferrari Beeper: [phone] Office: [phone] 06/09/2018

## 2018-06-09 NOTE — Evaluation (Signed)
Occupational Therapy Evaluation Patient Details Name: Ian Ramos MRN: [id number] DOB: [birth year] Today's Date: 06/09/2018    History of Present Illness Pt is a 72 y/o male admitted secondary to intractable chest and L shoulder pain. Neurosurgery feels this is radicular irritation of his left-sided C5 nerve root and started pt on steroids and neurontin. Pt with recent cervical fusion (05/23/18). Imaging negative for PE and showed probable esophagitis. Pt underwent cardiac cath on 9/5. In the AM of 9/6 pt with acute change of mental status and code stroke called. Pt found to have left temporal lobe infarct and right MCA/PCA watershed and right parietal punctate infarcts. Pt also find to have significant carotid stenosis.   PMH includes gout and recent Cervical fusion.    Clinical Impression   Pt presenting with significant functional change compared to prior session. Pt requiring Mod A for functional transfer to recliner with Max cues for sequencing. Pt requiring Max A during oral care to maintain grasp on tooth brush and facilitate normal movement patterns. Pt presenting with decreased cognition, vision, coordination, and balance throughout session. Continue to recommend dc to CIR for intensive OT to optimize his safety and independence with ADLs and functional mobility as well as decrease caregiver burden. Will continue to follow acutely as admitted.     Follow Up Recommendations  CIR;Supervision/Assistance - 24 hour    Equipment Recommendations  3 in 1 bedside commode    Recommendations for Other Services       Precautions / Restrictions Precautions Precautions: Fall;Cervical Precaution Booklet Issued: No Precaution Comments: reviewed cervical precautions Required Braces or Orthoses: Cervical Brace Cervical Brace: Soft collar;At all times Restrictions Weight Bearing Restrictions: No      Mobility Bed Mobility Overal bed mobility: Needs Assistance Bed Mobility:  Rolling;Sidelying to Sit Rolling: Max assist Sidelying to sit: Max assist       General bed mobility comments: Max A for log roll due to poor following of commands.   Transfers Overall transfer level: Needs assistance Equipment used: Rolling walker (2 wheeled) Transfers: Sit to/from [employer] Sit to Stand: Mod assist Stand pivot transfers: Mod assist       General transfer comment: Verbal/tactile cues for hand placement. Assist to bring hips up and for balance. Pt with difficulty placing hands on walker and LUE did not stay on walker    Balance Overall balance assessment: Needs assistance Sitting-balance support: Feet supported;Single extremity supported Sitting balance-Leahy Scale: Poor Sitting balance - Comments: Min to mod assist to maintain sitting forward on edge of chair. Postural control: Posterior lean Standing balance support: Bilateral upper extremity supported Standing balance-Leahy Scale: Poor Standing balance comment: walker and min to mod assist for static standing                           ADL either performed or assessed with clinical judgement   ADL Overall ADL's : Needs assistance/impaired     Grooming: Maximal assistance;Sitting;Oral care Grooming Details (indicate cue type and reason): Max A for oral care due to decreased cooridnation, grasp strength, vision deficits, and decreased cognition. Pt requiring assistance to maintain grasp on tooth brush; when reaching for tooth brush, pt undershooting.                 Toilet Transfer: Moderate assistance;Stand-pivot;RW(Simulated to recliner) Toilet Transfer Details (indicate cue type and reason): Mod A for power up and balance to pivot to recliner  Functional mobility during ADLs: Moderate assistance;Rolling walker(Stand pivot only) General ADL Comments: Pt presenting with significant functional decline. Pt presenting with decreased balance, coorindation,  cognition, vision, and strength.      Vision   Vision Assessment?: Vision impaired- to be further tested in functional context Additional Comments: Pt reporting "there are two tooth brushes" and has undershooting when reaching out towards objects. Will need to further assess     Perception     Praxis      Pertinent Vitals/Pain Pain Assessment: Faces Faces Pain Scale: Hurts even more Pain Location: L shoulder  Pain Descriptors / Indicators: Grimacing;Guarding Pain Intervention(s): Monitored during session;Limited activity within patient's tolerance;Repositioned     Hand Dominance     Extremity/Trunk Assessment Upper Extremity Assessment Upper Extremity Assessment: RUE deficits/detail;LUE deficits/detail RUE Deficits / Details: Poor grasp strength and unable to maintain grasp on tooth brush while brushing teeth. Pt with limited ROM and difficulty bringing hand to mouth. Pt presenting with poor gross motor and fine motor coordination with jerky movements. Pt undershooting during reaching.  RUE Sensation: decreased light touch RUE Coordination: decreased fine motor;decreased gross motor LUE Deficits / Details: Pt with decreased grasp strength and coordination. Pt with decreased movement and use of LUE.  LUE Sensation: decreased light touch LUE Coordination: decreased fine motor;decreased gross motor   Lower Extremity Assessment Lower Extremity Assessment: Defer to PT evaluation       Communication     Cognition Arousal/Alertness: Awake/alert Behavior During Therapy: Flat affect Overall Cognitive Status: Impaired/Different from baseline Area of Impairment: Problem solving;Awareness;Safety/judgement;Orientation;Attention;Memory;Following commands                 Orientation Level: Disoriented to;Place;Time;Situation Current Attention Level: Sustained Memory: Decreased recall of precautions;Decreased short-term memory Following Commands: Follows one step commands  inconsistently Safety/Judgement: Decreased awareness of safety;Decreased awareness of deficits Awareness: Intellectual Problem Solving: Difficulty sequencing;Requires verbal cues;Slow processing;Decreased initiation;Requires tactile cues General Comments: Pt with confused speech and unable to follow commands consistently.   General Comments  VSS; Sister present at begining of session    Exercises     Shoulder Instructions      Home Living                                          Prior Functioning/Environment                   OT Problem List:        OT Treatment/Interventions:      OT Goals(Current goals can be found in the care plan section) Acute Rehab OT Goals Patient Stated Goal: to go home OT Goal Formulation: With patient/family Time For Goal Achievement: 06/14/18 Potential to Achieve Goals: Good ADL Goals Pt Will Perform Grooming: with supervision;standing Pt Will Perform Upper Body Dressing: with set-up;sitting;with adaptive equipment Pt Will Perform Lower Body Dressing: with set-up;sit to/from stand;with adaptive equipment Pt Will Transfer to Toilet: with supervision;ambulating;regular height toilet Pt Will Perform Toileting - Clothing Manipulation and hygiene: with supervision;sit to/from stand Pt Will Perform Tub/Shower Transfer: Tub transfer;ambulating;shower seat Additional ADL Goal #1: Pt will be able to recall cervical precautions and maintain throughout ADL routine at indpendent level  OT Frequency: Min 3X/week   Barriers to D/C:            Co-evaluation              AM-PAC PT "  6 Clicks" Daily Activity     Outcome Measure Help from another person eating meals?: A Little Help from another person taking care of personal grooming?: A Lot Help from another person toileting, which includes using toliet, bedpan, or urinal?: A Lot Help from another person bathing (including washing, rinsing, drying)?: A Lot Help from another  person to put on and taking off regular upper body clothing?: A Lot Help from another person to put on and taking off regular lower body clothing?: A Lot 6 Click Score: 13   End of Session Equipment Utilized During Treatment: Cervical collar;Rolling walker;Gait belt Nurse Communication: Mobility status  Activity Tolerance: Patient tolerated treatment well Patient left: in chair;with call bell/phone within reach;with chair alarm set;with family/visitor present  OT Visit Diagnosis: Unsteadiness on feet (R26.81);Other abnormalities of gait and mobility (R26.89);Pain;Muscle weakness (generalized) (M62.81) Pain - Right/Left: Left Pain - part of body: Shoulder                Time: 4098-1191 OT Time Calculation (min): 20 min Charges:  OT General Charges $OT Visit: 1 Visit OT Treatments $Self Care/Home Management : 8-22 mins  Brittish Bolinger MSOT, OTR/L Acute Rehab Pager: [phone] Office: [phone]  Theodoro Grist Breanna Shorkey 06/09/2018, 1:18 PM

## 2018-06-09 NOTE — Anesthesia Procedure Notes (Signed)
Procedure Name: Intubation Date/Time: 06/09/2018 2:45 PM Performed by: Lucinda Dell, CRNA Pre-anesthesia Checklist: Patient identified, Emergency Drugs available, Suction available and Patient being monitored Patient Re-evaluated:Patient Re-evaluated prior to induction Oxygen Delivery Method: Circle system utilized Preoxygenation: Pre-oxygenation with 100% oxygen Induction Type: IV induction Ventilation: Mask ventilation without difficulty Laryngoscope Size: Glidescope and 4 Grade View: Grade I Tube type: Subglottic suction tube Tube size: 7.5 mm Number of attempts: 1 Airway Equipment and Method: Video-laryngoscopy and Stylet Placement Confirmation: ETT inserted through vocal cords under direct vision,  positive ETCO2 and breath sounds checked- equal and bilateral Secured at: 22 cm Tube secured with: Tape Dental Injury: Teeth and Oropharynx as per pre-operative assessment

## 2018-06-09 NOTE — Progress Notes (Signed)
Physical Therapy Treatment Patient Details Name: Ian Ramos MRN: [id number] DOB: 1946/03/23 Today's Date: 06/09/2018    History of Present Illness Pt is a 72 y/o male admitted secondary to intractable chest and L shoulder pain. Neurosurgery feels this is radicular irritation of his left-sided C5 nerve root and started pt on steroids and neurontin. Pt with recent cervical fusion (05/23/18). Imaging negative for PE and showed probable esophagitis. Pt underwent cardiac cath on 9/5. In the AM of 9/6 pt with acute change of mental status and code stroke called. Pt found to have left temporal lobe infarct and right MCA/PCA watershed and right parietal punctate infarcts. Pt also find to have significant carotid stenosis.   PMH includes gout and recent Cervical fusion.     PT Comments    Pt with decline in functional status due to new CVA's and confusion. Goals revised. Pt will need CIR prior to dc.   Follow Up Recommendations  CIR;Supervision for mobility/OOB     Equipment Recommendations  None recommended by PT    Recommendations for Other Services       Precautions / Restrictions Precautions Precautions: Fall;Cervical Precaution Booklet Issued: No Precaution Comments: reviewed cervical precautions Required Braces or Orthoses: Cervical Brace Cervical Brace: Soft collar;At all times Restrictions Weight Bearing Restrictions: No    Mobility  Bed Mobility               General bed mobility comments: Pt up in chair  Transfers Overall transfer level: Needs assistance Equipment used: Rolling walker (2 wheeled) Transfers: Sit to/from Stand Sit to Stand: Mod assist         General transfer comment: Verbal/tactile cues for hand placement. Assist to bring hips up and for balance. Pt with difficulty placing hands on walker and LUE did not stay on walker  Ambulation/Gait Ambulation/Gait assistance: Mod assist Gait Distance (Feet): 2 Feet Assistive device: Rolling walker (2  wheeled) Gait Pattern/deviations: Step-to pattern;Shuffle;Decreased step length - right;Decreased step length - left Gait velocity: Decreased Gait velocity interpretation: <1.8 ft/sec, indicate of risk for recurrent falls General Gait Details: Assist for balance and support. Pt leaning left and LUE not keeping grasp of walker.   Stairs             Wheelchair Mobility    Modified Rankin (Stroke Patients Only) Modified Rankin (Stroke Patients Only) Pre-Morbid Rankin Score: Moderately severe disability Modified Rankin: Moderately severe disability     Balance Overall balance assessment: Needs assistance Sitting-balance support: Feet supported;Single extremity supported Sitting balance-Leahy Scale: Poor Sitting balance - Comments: Min to mod assist to maintain sitting forward on edge of chair. Postural control: Posterior lean Standing balance support: Bilateral upper extremity supported Standing balance-Leahy Scale: Poor Standing balance comment: walker and min to mod assist for static standing                            Cognition Arousal/Alertness: Awake/alert Behavior During Therapy: Flat affect Overall Cognitive Status: Impaired/Different from baseline Area of Impairment: Problem solving;Awareness;Safety/judgement;Orientation;Attention;Memory;Following commands                 Orientation Level: Disoriented to;Place;Time;Situation Current Attention Level: Sustained Memory: Decreased recall of precautions;Decreased short-term memory Following Commands: Follows one step commands inconsistently Safety/Judgement: Decreased awareness of safety;Decreased awareness of deficits Awareness: Intellectual Problem Solving: Difficulty sequencing;Requires verbal cues;Slow processing;Decreased initiation;Requires tactile cues General Comments: Pt with confused speech and unable to follow commands consistently.      Exercises  General Comments         Pertinent Vitals/Pain Pain Assessment: Faces Faces Pain Scale: Hurts even more Pain Location: L shoulder  Pain Descriptors / Indicators: Grimacing;Guarding Pain Intervention(s): Limited activity within patient's tolerance;Monitored during session;Repositioned    Home Living                      Prior Function            PT Goals (current goals can now be found in the care plan section) Acute Rehab PT Goals PT Goal Formulation: With patient Time For Goal Achievement: 06/23/18 Potential to Achieve Goals: Good Progress towards PT goals: Goals downgraded-see care plan    Frequency    Min 3X/week      PT Plan Current plan remains appropriate    Co-evaluation              AM-PAC PT "6 Clicks" Daily Activity  Outcome Measure  Difficulty turning over in bed (including adjusting bedclothes, sheets and blankets)?: Unable Difficulty moving from lying on back to sitting on the side of the bed? : Unable Difficulty sitting down on and standing up from a chair with arms (e.g., wheelchair, bedside commode, etc,.)?: Unable Help needed moving to and from a bed to chair (including a wheelchair)?: A Lot Help needed walking in hospital room?: Total Help needed climbing 3-5 steps with a railing? : Total 6 Click Score: 7    End of Session Equipment Utilized During Treatment: Gait belt;Cervical collar Activity Tolerance: Patient tolerated treatment well Patient left: with call bell/phone within reach;in chair;with chair alarm set;with family/visitor present Nurse Communication: Mobility status PT Visit Diagnosis: Pain;Unsteadiness on feet (R26.81);Muscle weakness (generalized) (M62.81);Difficulty in walking, not elsewhere classified (R26.2);Other symptoms and signs involving the nervous system (R29.898) Pain - Right/Left: Left Pain - part of body: Shoulder     Time: 1610-9604 PT Time Calculation (min) (ACUTE ONLY): 28 min  Charges:  $Therapeutic Activity: 23-37  mins                     [facility] PT Acute Rehabilitation Services Pager [phone] Office [phone]    Angelina Ok [facility] 06/09/2018, 12:42 PM

## 2018-06-09 NOTE — Progress Notes (Signed)
RT responded to [employer], assisted in CPR and intubation.  Pt transported using ambu bag from 4E04 to 2H09.

## 2018-06-09 NOTE — Progress Notes (Signed)
PROGRESS NOTE  Ian Ramos  XUX:[id number] DOB: Oct 23, 1945 DOA: 05/28/2018 PCP: Ian Sax, MD   Brief Narrative: Ian Ramos a 72 y.o.malewith medical history significant forchronic kidney disease stage III and cervical stenosis with myelopathy status post cervical laminectomy on 05/23/2018, now presenting to the emergency department for evaluation of chest pain. the chest pain is substernal, associated with change in the position, worse when sitting up and better laying down. He also reports left shoulder pain and unable to lift the left shoulder from pain.  Work-up significant for left bundle branch block, no old EKG to compare, and 2D echo with EF 35% with global hypokinesis. Subsequent stress testing showed intermediate risk, so cardiac catheterization was performed 9/5 showing LAD and OM2 stenosis with no intervention. The patient developed altered mental status and aphasia shortly thereafter and was found to have a left temporal lobe infarct. Neurology was consulted. Carotid U/S demonstrated significant bilateral ICA stenoses (80-99% on right, complete occlusion on left) for which vascular surgery was consulted. Plan is for consideration of carotid artery stenting on the right. Aspirin and plavix in addition to statin have been started. Continues on imdur for antianginal reasons, though further therapy hampered by bradycardia, renal insufficiency, and need to avoid hypotension with ICA stenosis.   Assessment & Plan: Principal Problem:   Intractable pain Active Problems:   Cervical myelopathy (HCC)   Chest pain   CKD (chronic kidney disease), stage III (HCC)   Esophageal thickening   LBBB (left bundle branch block)   Postoperative anemia   DCM (dilated cardiomyopathy) (HCC)   Acute systolic CHF (congestive heart failure) (HCC)   Coronary artery disease due to lipid rich plaque  Atypical chest pain, CAD: With left shoulder pain, possibly related to  esophageal reflux/spasm and/or MSK with recent cervical spine surgery vs. cardiac ischemia with LBBB and unknown chronicity. Troponins negative, echo w/EF 35-40%, diffuse hypokinesis, also has evidence of esophageal dysmotility on barium study. Stress test 9/4 was intermediate risk, LHC 9/5 showed 75% stenosis to proximal LAD, 75% to OM2 with medical management recommended, consider PCI if angina is refractory to optimum medical therapy.  - Statin, aspirin, now plavix, not on BB due to bradycardia. - Imdur started at low dose. Increased dose today, continue to monitor  Acute bilateral CVA: Initially felt to be embolic, though with critical bilateral carotid stenosis and hypotension as well as watershed distribution of most infarcts, ICA stenosis is felt to be etiology. Locations include periventricular left M1/M2 distribution, right MCA/PCA distribution, and right temporoparietal cortex with resultant confusion and aphasia.   - On antiplatelet per neurology, statin - HbA1c 6.1%, will need PCP follow up. Consider metformin based on CrCl.   Bilateral carotid artery stenosis: Right ICA severely occluded (80-99%) and left ICA said to be completely occluded.  - Ideally 130-132mmHg SBP  - Vascular surgery consulted. Ian Ramos to discuss with Ian Ramos possible right ICA stenting, started plavix. - ASA, plavix, statin   Acute encephalopathy, acute delirium: Waxing/waning features most consistent with delirium, worsened by sleep deprivation.  - Continue delirium precautions, reviewed with RN and sister.  - Trial low dose zyprexa tonight  Vomiting, possible aspiration:  - Needs to be nearly upright for any meals with ongoing neurological deficits, and encephalopathy limiting attention. - Monitor fever curve, low threshold for culture, CXR.  Dilated cardiomyopathy: Clinically euvolemic.  - Would benefit from guideline-directed therapy, but Cr up so ARB stopped (just transitioned to losartan).  Bradycardia limiting beta blocker therapy.  -  No need for diuretic at this time. Will stop IVF  Hyperlipidemia: LDL 93.  - Started statin for secondary prevention.  Esophagitis/esophageal dysmotility: CTA shows circumferential thickening of the esophagus. GI consulted for recommendations, underwent barium swallow study showing moderate dysmotility.  - PPI per GI, will need outpatient GI f/u for screening colo   Cervical myelopathy: s/p laminectomy on 8/23. - Continue cervical collar.  - Plan dispo to CIR once work up and management completed. - Neurosurgery ordered neurontin and steroid course (has been completed).  AKI on stage 3 CKD: - Monitor volume status closely. - Stopped ARB - Outpatient follow up with PCP. - Monitor BMP  Hyperkalemia: Most likely due to ARB initiation. Resolved. - Can stop daily patiromer. - Stopped ARB - Continue telemetry - Monitor K, Cr daily  Mild normocytic anemia: Hemoglobin stable around 10.  - Monitor  Constipation: Resolved - Continue bowel program  Leukocytosis: Mild with flat trend in setting of CVA, possibly reactive in absence of fever or localizing symptoms.  - Monitor  DVT prophylaxis: Heparin subcutaneous. Code Status: Full Family Communication: Sister at bedside this SM Disposition Plan: CIR remains the planned disposition. Awaiting vascular surgery input on timing of stenting  Consultants:   PM&R  GI  Cardiology  Neurology  Vascular surgery  Procedures:  2D echo 05/29/2018 Study Conclusions  - Left ventricle: The cavity size was normal. Systolic function was moderately reduced. The estimated ejection fraction was in the range of 35% to 40%. Diffuse hypokinesis. Doppler parameters are consistent with abnormal left ventricular relaxation (grade 1 diastolic dysfunction). Doppler parameters are consistent with elevated ventricular end-diastolic filling pressure. - Aortic valve: A bicuspid morphology  cannot be excluded; severely thickened, severely calcified leaflets. There was trivial regurgitation. - Mitral valve: There was mild regurgitation. - Left atrium: The atrium was normal in size. - Right atrium: The atrium was normal in size. - Tricuspid valve: There was trivial regurgitation. - Pulmonary arteries: Systolic pressure was within the normal range. - Inferior vena cava: The vessel was dilated. The respirophasic diameter changes were blunted (<50%), consistent with elevated central venous pressure. - Pericardium, extracardiac: There was no pericardial effusion.  Antimicrobials:  None   Subjective: Had another episode of emesis while being fed by family yesterday, per RN was laying down in bed. No trouble breathing, fever, chest pain or cough. Mentation continues to decline with waxing/waning features per sister. Has not slept in 3 days.  Objective: Vitals:   06/08/18 2005 06/08/18 2354 06/09/18 0503 06/09/18 0744  BP: 108/66 113/60 123/63 140/64  Pulse: 87 69 83   Resp: (!) 21 (!) 24 (!) 22 (!) 23  Temp: (!) 97.4 F (36.3 C) 98.4 F (36.9 C) 98.2 F (36.8 C) 98.4 F (36.9 C)  TempSrc: Oral Oral Oral Oral  SpO2: 96% 90% 95% (!) 88%  Weight:      Height:        Intake/Output Summary (Last 24 hours) at 06/09/2018 1336 Last data filed at 06/09/2018 0900 Gross per 24 hour  Intake 240 ml  Output 715 ml  Net -475 ml   Filed Weights   05/31/18 0425 06/06/18 0432 06/06/18 0605  Weight: 73.4 kg 73.4 kg 74.7 kg   Gen: 72 y.o. male in no distress Pulm: Nonlabored breathing room air. Clear. CV: Regular rate and rhythm. No murmur, rub, or gallop. No JVD, no dependent edema. GI: Abdomen soft, non-tender, non-distended, with normoactive bowel sounds.  Ext: Warm, no deformities Skin: No rashes, lesions or ulcers  on visualized skin.  Neuro: Alert, oriented to person only. Poor coordination, expressive aphasia, preserved strength in extremities, continues to    Psych: Judgement and insight impaired. Mood euthymic   CBC: Recent Labs  Lab 06/04/18 0340 06/06/18 0905 06/07/18 0226 06/09/18 0401  WBC 11.7* 11.8* 9.1 11.5*  HGB 10.6* 10.5* 9.5* 11.5*  HCT 32.5* 32.9* 30.0* 34.5*  MCV 91.3 92.9 92.0 88.7  PLT 239 199 176 191   Basic Metabolic Panel: Recent Labs  Lab 06/05/18 0349 06/06/18 0807 06/07/18 0226 06/08/18 0433 06/09/18 0401  NA 135 139 136 136 136  K 4.9 5.7* 4.4 4.4 4.4  CL 104 107 104 104 103  CO2 24 26 24 22  21*  GLUCOSE 135* 95 116* 111* 93  BUN 37* 28* 29* 23 26*  CREATININE 1.72* 1.59* 1.70* 1.46* 1.54*  CALCIUM 8.0* 8.7* 8.3* 8.7* 9.2   GFR: Estimated Creatinine Clearance: 40.7 mL/min (A) (by C-G formula based on SCr of 1.54 mg/dL (H)). Liver Function Tests: No results for input(s): AST, ALT, ALKPHOS, BILITOT, PROT, ALBUMIN in the last 168 hours. No results for input(s): LIPASE, AMYLASE in the last 168 hours. No results for input(s): AMMONIA in the last 168 hours. Coagulation Profile: No results for input(s): INR, PROTIME in the last 168 hours. Cardiac Enzymes: No results for input(s): CKTOTAL, CKMB, CKMBINDEX, TROPONINI in the last 168 hours. BNP (last 3 results) No results for input(s): PROBNP in the last 8760 hours. HbA1C: No results for input(s): HGBA1C in the last 72 hours. CBG: Recent Labs  Lab 06/06/18 0508  GLUCAP 92   Lipid Profile: Recent Labs    06/07/18 0226  CHOL 155  HDL 31*  LDLCALC 93  TRIG 161*  CHOLHDL 5.0   Thyroid Function Tests: No results for input(s): TSH, T4TOTAL, FREET4, T3FREE, THYROIDAB in the last 72 hours. Anemia Panel: No results for input(s): VITAMINB12, FOLATE, FERRITIN, TIBC, IRON, RETICCTPCT in the last 72 hours. Urine analysis:    Component Value Date/Time   COLORURINE STRAW (A) 05/26/2018 0724   APPEARANCEUR CLEAR 05/26/2018 0724   LABSPEC 1.013 05/26/2018 0724   PHURINE 6.0 05/26/2018 0724   GLUCOSEU NEGATIVE 05/26/2018 0724   HGBUR NEGATIVE  05/26/2018 0724   BILIRUBINUR NEGATIVE 05/26/2018 0724   KETONESUR NEGATIVE 05/26/2018 0724   PROTEINUR NEGATIVE 05/26/2018 0724   UROBILINOGEN 0.2 06/25/2011 1027   NITRITE NEGATIVE 05/26/2018 0724   LEUKOCYTESUR NEGATIVE 05/26/2018 0724   No results found for this or any previous visit (from the past 240 hour(s)).    Radiology Studies: No results found.  Scheduled Meds: .  stroke: mapping our early stages of recovery book   Does not apply Once  . aspirin EC  81 mg Oral Daily  . atorvastatin  80 mg Oral q1800  . clopidogrel  75 mg Oral Daily  . enoxaparin (LOVENOX) injection  40 mg Subcutaneous Q24H  . gabapentin  300 mg Oral TID  . iopamidol  100 mL Intravenous Once  . isosorbide mononitrate  30 mg Oral Daily  . pantoprazole  40 mg Oral BID  . patiromer  16.8 g Oral Daily  . senna-docusate  2 tablet Oral BID  . sodium chloride flush  3 mL Intravenous Q12H   Continuous Infusions: . sodium chloride       LOS: 5 days   Time spent: 25 minutes.  Tyrone Nine, MD [facility] [email or web address] Password [email or web address] 06/09/2018, 1:36 PM

## 2018-06-10 ENCOUNTER — Inpatient Hospital Stay (HOSPITAL_COMMUNITY): Payer: BLUE CROSS/BLUE SHIELD

## 2018-06-10 DIAGNOSIS — Z9911 Dependence on respirator [ventilator] status: Secondary | ICD-10-CM

## 2018-06-10 DIAGNOSIS — R570 Cardiogenic shock: Secondary | ICD-10-CM

## 2018-06-10 DIAGNOSIS — I63233 Cerebral infarction due to unspecified occlusion or stenosis of bilateral carotid arteries: Secondary | ICD-10-CM

## 2018-06-10 DIAGNOSIS — I6389 Other cerebral infarction: Secondary | ICD-10-CM

## 2018-06-10 LAB — GLUCOSE, CAPILLARY
GLUCOSE-CAPILLARY: 119 mg/dL — AB (ref 70–99)
GLUCOSE-CAPILLARY: 133 mg/dL — AB (ref 70–99)
GLUCOSE-CAPILLARY: 138 mg/dL — AB (ref 70–99)
GLUCOSE-CAPILLARY: 144 mg/dL — AB (ref 70–99)
GLUCOSE-CAPILLARY: 147 mg/dL — AB (ref 70–99)
Glucose-Capillary: 150 mg/dL — ABNORMAL HIGH (ref 70–99)
Glucose-Capillary: 99 mg/dL (ref 70–99)

## 2018-06-10 LAB — CBC WITH DIFFERENTIAL/PLATELET
Abs Immature Granulocytes: 0.1 10*3/uL (ref 0.0–0.1)
BASOS ABS: 0 10*3/uL (ref 0.0–0.1)
BASOS PCT: 0 %
EOS ABS: 0.1 10*3/uL (ref 0.0–0.7)
EOS PCT: 0 %
HCT: 33.7 % — ABNORMAL LOW (ref 39.0–52.0)
HEMOGLOBIN: 11 g/dL — AB (ref 13.0–17.0)
Immature Granulocytes: 0 %
LYMPHS PCT: 4 %
Lymphs Abs: 0.8 10*3/uL (ref 0.7–4.0)
MCH: 29.6 pg (ref 26.0–34.0)
MCHC: 32.6 g/dL (ref 30.0–36.0)
MCV: 90.8 fL (ref 78.0–100.0)
Monocytes Absolute: 1.5 10*3/uL — ABNORMAL HIGH (ref 0.1–1.0)
Monocytes Relative: 8 %
Neutro Abs: 16.1 10*3/uL — ABNORMAL HIGH (ref 1.7–7.7)
Neutrophils Relative %: 88 %
PLATELETS: 195 10*3/uL (ref 150–400)
RBC: 3.71 MIL/uL — ABNORMAL LOW (ref 4.22–5.81)
RDW: 12.1 % (ref 11.5–15.5)
WBC: 18.5 10*3/uL — AB (ref 4.0–10.5)

## 2018-06-10 LAB — BASIC METABOLIC PANEL
ANION GAP: 13 (ref 5–15)
BUN: 36 mg/dL — ABNORMAL HIGH (ref 8–23)
CALCIUM: 8.2 mg/dL — AB (ref 8.9–10.3)
CO2: 20 mmol/L — ABNORMAL LOW (ref 22–32)
Chloride: 101 mmol/L (ref 98–111)
Creatinine, Ser: 1.98 mg/dL — ABNORMAL HIGH (ref 0.61–1.24)
GFR, EST AFRICAN AMERICAN: 37 mL/min — AB (ref >60)
GFR, EST NON AFRICAN AMERICAN: 32 mL/min — AB (ref >60)
Glucose, Bld: 157 mg/dL — ABNORMAL HIGH (ref 70–99)
POTASSIUM: 3.9 mmol/L (ref 3.5–5.1)
SODIUM: 134 mmol/L — AB (ref 135–145)

## 2018-06-10 LAB — COOXEMETRY PANEL
Carboxyhemoglobin: 1.4 % (ref 0.5–1.5)
Methemoglobin: 1.7 % — ABNORMAL HIGH (ref 0.0–1.5)
O2 Saturation: 67.2 %
Total hemoglobin: 11 g/dL — ABNORMAL LOW (ref 12.0–16.0)

## 2018-06-10 MED ORDER — SODIUM CHLORIDE 0.9 % IV BOLUS
500.0000 mL | Freq: Once | INTRAVENOUS | Status: AC
  Administered 2018-06-10: 500 mL via INTRAVENOUS

## 2018-06-10 MED ORDER — DEXMEDETOMIDINE HCL IN NACL 400 MCG/100ML IV SOLN
0.4000 ug/kg/h | INTRAVENOUS | Status: DC
  Administered 2018-06-10: 0.8 ug/kg/h via INTRAVENOUS
  Administered 2018-06-10: 0.4 ug/kg/h via INTRAVENOUS
  Administered 2018-06-10: 0.5 ug/kg/h via INTRAVENOUS
  Administered 2018-06-11: 0.4 ug/kg/h via INTRAVENOUS
  Administered 2018-06-12: 2 ug/kg/h via INTRAVENOUS
  Filled 2018-06-10 (×7): qty 100

## 2018-06-10 MED ORDER — ASPIRIN 300 MG RE SUPP
150.0000 mg | Freq: Every day | RECTAL | Status: DC
  Administered 2018-06-10: 150 mg via RECTAL
  Filled 2018-06-10: qty 1

## 2018-06-10 NOTE — Progress Notes (Signed)
eLink Physician-Brief Progress Note Patient Name: Ian Ramos DOB: [birth year] MRN: [id number]   Date of Service  06/10/2018  HPI/Events of Note  Oliguria - Bladder scan with 400 mL. CVP = 4. LVEF = 30% to 35%.   eICU Interventions  Will order: 1. Bolus with 0.9 NaCl 500 mL IV over 30 minutes. 2. I/O Cath PRN.      Intervention Category Intermediate Interventions: Oliguria - evaluation and management  Yorley Buch Eugene 06/10/2018, 3:18 AM

## 2018-06-10 NOTE — Progress Notes (Addendum)
Ian Ramos  TIR:[id number] DOB: April 16, 1946 DOA: 05/28/2018 PCP: Mliss Sax, MD    LOS: 6 days   Reason for Consult / Chief Complaint:  Respiratory arrest   Consulting MD and date:  gruntz   HPI/Summary of hospital stay:  72 year old male patient history of stage III chronic kidney disease, cervical spine stenosis status post recent cervical laminectomy August 2019.  Presented to the emergency room on 8/28 with chief complaint of chest pain, shoulder pain and substernal discomfort..  Was found to have some esophageal thickening, possibly some esophageal dysmotility, etiology was never clearly identified but resolved.  He was found to have new dilated cardiomyopathy with EF 35% and diffuse hypokinesis, and bundle branch block, but it was unclear as to whether or not if this was the primary cause of his discomfort.  The plan was to proceed with iliac ischemia evaluation.  He underwent cardiac catheterization on 9/5: Showed moderate obstructive coronary artery disease with 75% occluded proximal LAD 75% OM 2 occluded RV marginal branch with left to right collaterals.  This was not considered critical CAD and medical management was recommended. On 9/6 the patient developed acute mental status change.  On initial eval he was confused, preservation, and had right upper extremity drift and right visual field cut.  CT brain history left temporal lobe infarct felt possibly embolic in etiology.  Carotid ultrasound showed likely left carotid occlusion with high-grade right carotid stenosis.  Vascular surgery was consulted.  Plavix was recommended and delaying carotid endarterectomy for further recovery was felt best course of action. 9/7: Continues to demonstrate aphasia.  Also with ongoing left upper extremity weakness.  MRI showed right MCA/PCA watershed and right parietal punctate infarcts felt more consistent with high-grade ICA stenosis on the left. 9/8: Patient vomited twice during the  day while family was assisting with feeding 9/9: Found unresponsive in chair.  Blood pressure 48/32 heart rate 58 area not clear that he lost pulses.  Pulse oximetry not recorded.  He was apneic, Code blue was called patient was intubated and transferred to the intensive care.  Subjective:  Comfortable mechanical ventilation.  No issues overnight.  Discussed care with nursing at bedside.  Does have issues with urinary retention.  May need Foley placement.  Objective   Blood pressure (!) 162/73, pulse 70, temperature 98.7 F (37.1 C), temperature source Oral, resp. rate 19, height 5' 7.5" (1.715 m), weight 74.7 kg, SpO2 100 %. CVP:  [2 mmHg-8 mmHg] 2 mmHg  Vent Mode: PSV;CPAP FiO2 (%):  [40 %-100 %] 40 % Set Rate:  [15 bmp] 15 bmp Vt Set:  [540 mL] 540 mL PEEP:  [5 cmH20] 5 cmH20 Pressure Support:  [8 cmH20] 8 cmH20 Plateau Pressure:  [13 cmH20-16 cmH20] 16 cmH20   Intake/Output Summary (Last 24 hours) at 06/10/2018 1150 Last data filed at 06/10/2018 0500 Gross per 24 hour  Intake 1486.36 ml  Output 1150 ml  Net 336.36 ml   Filed Weights   05/31/18 0425 06/06/18 0432 06/06/18 0605  Weight: 73.4 kg 73.4 kg 74.7 kg    Examination:  General appearance: 72 y.o., male, intubated on mechanical ventilation  Eyes: anicteric sclerae, tracking appropriately HENT: NCAT; oropharynx, MMM, bleeding from nare Neck: Trachea midline; soft collar in place  Lungs: CTAB, no crackles, no wheeze, with normal respiratory effort and no intercostal retractions CV: brady cardic , S1, S2, distant heart tones  Abdomen: Soft, non-tender; non-distended, BS present  Extremities: No peripheral edema or radial  and DP pulses present bilaterally  Skin: Normal temperature, turgor and texture; no rash Psych: unable to assess fully, no evidence of acute delirium  Neuro: alert, following commands  Consults: date of consult/date signed off & final recs:  GI 8/29: Found asymptomatic esophagitis.  Also evidence of  esophageal dysmotility on esophagram.  GI signed off on 8/30 with final recommendations for daily PPI x4 weeks and routine cancer screening Cardiology consulted 9/3 for chest pain.  Underwent left heart cath which showed diffuse coronary artery disease but noncritical recommended medical therapy, because he had bradycardia and CKD ARB was held he was recommended to continue hydralazine, nitrates and diuretics 9/7: Neurology consulted for stroke Vascular surgery consulted 9/6: For bilateral carotid artery disease: Started on Plavix.  Recommending gentle hydration.  Considering the patient for carotid stenting in the next 2 weeks  Procedures:  Left heart cath 9/5 showing moderate noncritical coronary artery disease.  75% occluded proximal LAD 75% occluded OM.  Medical management was recommended   Significant Diagnostic Tests: Echocardiogram 8/29:EF 35 to 40%.  Diffuse hypokinesis.  Doppler parameters consistent with grade 1 diastolic dysfunction. 9/6: MRI brainExtensive motion artifact.  Minimal flow related signal in the left cervical ICA and absent flow of the left ICA representing occlusion or proximal high-grade stenosis.  There is a large anterior communicating artery providing collateral circulation to the left MCA and left PCA. 9/6: CT brain: Subtle hypodensity in the anterior mid left temporal lobe 9/4: Nuclear stress test EF 39% findings were consistent with ischemia felt to be intermittent risk 8/30: Esophagram showing moderate esophageal dysmotility  Micro Data: Respiratory culture 9/9>>>  Antimicrobials:  Vancomycin 9/9 Zosyn 9/9  Resolved Hospital Problem list    Assessment & Plan:   Assessment: Acute hypoxemic respiratory failure requiring intubation and mechanical ventilation.  Unclear at this time what the etiology of his respiratory arrest was. I wonder if there is concern for aspiration pneumonia in the setting. Additionally low blood pressures leading to poor cerebral  perfusion due to his bilateral carotid stenosis could be a factor.  Post extubation will need evaluation by speech.  Possible aspiration pneumonia, respiratory cultures with gram-negative rods and gram-positive cocci.  Shock, hypertension, need for vasopressor support to maintain systolic blood pressure greater than 130, possible component of cardiogenic shock.  Dilated cardiomyopathy with a reduced ejection fraction, acute systolic heart failure Acute bilateral CVA, bilateral carotid stenosis, altered mental status could be related to hypotension and the progressive increase in heart failure medications.  Neurology recommending systolic blood pressures between 130-150 which complicates management of his heart failure.  Vomiting, history of esophageal dysmotility Cervical myelopathy status post laminectomy Anemia of chronic disease  Urinary retention.  Bradycardia LBBB   Plan: Patient remains intubated in intensive care unit on mechanical support with close hemodynamic and respiratory monitoring. Continue antimicrobials, continue vancomycin and piperacillin tazobactam.  Will de-escalate once culture speciate. Continue full vent support wean FiO2 as tolerated Maintain RA SS goal 0 to -1, PAD protocol Continue norepinephrine infusion to maintain systolic blood pressure greater than 130. Critical care appreciate vascular surgery recommendations regarding carotid disease. Antiplatelet and statin therapy continued Did have difficulty with placement of enteric feeding tube.  At this point will need to consider core track placement to maintain enteral access to give Plavix.  We will switch to rectal aspirin in the meantime. Remains in soft collar secondary to cervical myelopathy status post laminectomy. Post extubation will need evaluation by speech therapy. Discussed need for repeat in and  out catheterization of bladder.  May need Foley placement.  Discussed with nursing. CCM appreciate  cardiology recommendation regarding management of underlying heart failure. We have consulted the core track team for placement of DHT. DVT prophylaxis Lovenox GI Proflex PPI N.p.o. continuing tube feeds.   Disposition / Summary of Today's Plan 06/10/18   Keep pressure up. Awaiting cultures. Needs enteral access.   Best Practice / Goals of Care / Disposition.   DVT prophylaxis: LMWH GI prophylaxis: PPI VT  Diet: NPO, start tubefeeds Mobility: BR Code Status: full Family Communication: full   Labs    CBC: Recent Labs  Lab 06/04/18 0340 06/06/18 0905 06/07/18 0226 06/09/18 0401 06/10/18 0327  WBC 11.7* 11.8* 9.1 11.5* 18.5*  NEUTROABS  --   --   --   --  16.1*  HGB 10.6* 10.5* 9.5* 11.5* 11.0*  HCT 32.5* 32.9* 30.0* 34.5* 33.7*  MCV 91.3 92.9 92.0 88.7 90.8  PLT 239 199 176 191 195   Basic Metabolic Panel: Recent Labs  Lab 06/06/18 0807 06/07/18 0226 06/08/18 0433 06/09/18 0401 06/10/18 0327  NA 139 136 136 136 134*  K 5.7* 4.4 4.4 4.4 3.9  CL 107 104 104 103 101  CO2 26 24 22  21* 20*  GLUCOSE 95 116* 111* 93 157*  BUN 28* 29* 23 26* 36*  CREATININE 1.59* 1.70* 1.46* 1.54* 1.98*  CALCIUM 8.7* 8.3* 8.7* 9.2 8.2*   GFR: Estimated Creatinine Clearance: 31.6 mL/min (A) (by C-G formula based on SCr of 1.98 mg/dL (H)). Recent Labs  Lab 06/06/18 0905 06/07/18 0226 06/09/18 0401 06/10/18 0327  WBC 11.8* 9.1 11.5* 18.5*   Liver Function Tests: No results for input(s): AST, ALT, ALKPHOS, BILITOT, PROT, ALBUMIN in the last 168 hours. No results for input(s): LIPASE, AMYLASE in the last 168 hours. No results for input(s): AMMONIA in the last 168 hours.   ABG    Component Value Date/Time   PHART 7.417 06/09/2018 1648   PCO2ART 34.1 06/09/2018 1648   PO2ART 360 (H) 06/09/2018 1648   HCO3 21.5 06/09/2018 1648   TCO2 24 06/25/2011 1054   ACIDBASEDEF 2.3 (H) 06/09/2018 1648   O2SAT 67.2 06/10/2018 1110    Coagulation Profile: No results for input(s): INR,  PROTIME in the last 168 hours.   Cardiac Enzymes: No results for input(s): CKTOTAL, CKMB, CKMBINDEX, TROPONINI in the last 168 hours.   HbA1C: Hgb A1c MFr Bld  Date/Time Value Ref Range Status  06/06/2018 08:58 AM 6.1 (H) 4.8 - 5.6 % Final    Comment:    (NOTE) Pre diabetes:          5.7%-6.4% Diabetes:              >6.4% Glycemic control for   <7.0% adults with diabetes    CBG: Recent Labs  Lab 06/09/18 2007 06/10/18 0021 06/10/18 0427 06/10/18 0800 06/10/18 1136  GLUCAP 147* 138* 144* 150* 119*    This patient is critically ill with multiple organ system failure; which, requires frequent high complexity decision making, assessment, support, evaluation, and titration of therapies. This was completed through the application of advanced monitoring technologies and extensive interpretation of multiple databases. During this encounter critical care time was devoted to patient care services described in this note for 38 minutes.   Josephine Igo, DO [facility] 06/10/2018 11:51 AM  Personal pager: [phone] If unanswered, please page CCM On-call: #[phone]

## 2018-06-10 NOTE — Progress Notes (Signed)
PT Cancellation Note  Patient Details Name: Ian Ramos MRN: [id number] DOB: 1946/09/10   Cancelled Treatment:    Reason Eval/Treat Not Completed: Medical issues which prohibited therapy. Pt now intubated. Will monitor for appropriateness to resume therapy.   Angelina Ok [facility] 06/10/2018, 8:53 AM Skip Mayer PT Acute Rehabilitation Services Pager [phone] Office [phone]

## 2018-06-10 NOTE — Progress Notes (Addendum)
1030: Upon assessment, OG tube coiled in patient's mouth.  RN tried to advance OG tube but was unsuccessful.  RN attempted to place new OG tube but had difficulty advancing, Dub Amis RN assisted and OG tube advanced.  Abdominal xray recommended advancement.  When RN advanced OG tube, OG tube coiled and no longer was present in stomach.  RN tried to advance OG tube and was unsuccessful.  RN attempted NG tube placement with no success.  Edilia Bo RN attempted OG tube placement with no success.  Discussed situation with MD Icard as RN unable to give patient aspirin and plavix.  No cortrak team available per [email or web address], RN tried to call dietician to see if dietician may be able to place cortrak but no answer.  Placed dietician consult requesting call to discuss if any available option to have cortrak placed today 9/10.   1200; Emer Colleran RN attempted placement of OG tube, unsuccessful and patient is having oral and nasal bleeding s/p OG and NG tube attempts.  MD Icard aware.  Cortrak order placed but MD aware no team present today and no return call at this time from dietician.  Daily aspirin changed to a suppository.

## 2018-06-10 NOTE — Progress Notes (Signed)
eLink Physician-Brief Progress Note Patient Name: [name] DOB: 10-Aug-1946 MRN: [id number]   Date of Service  06/10/2018  HPI/Events of Note  Agitation  eICU Interventions  Will order: 1. Precedex IV infusion. Titrate to RASS = 0.      Intervention Category Major Interventions: Delirium, psychosis, severe agitation - evaluation and management  Leeland Lovelady Eugene 06/10/2018, 2:00 AM

## 2018-06-10 NOTE — Plan of Care (Signed)
Patient remains on ventilator, bleeding noted from mouth due to possible trauma associated with unsuccessful insertion of OG and NG tubes.  Patient nods appropriately, indicates he is not in pain.  Able to communicate needs.  Family at bedside is supportive and attentive.  Monitoring.

## 2018-06-10 NOTE — Progress Notes (Signed)
OT Cancellation Note  Patient Details Name: DURWIN HEYMAN MRN: [id number] DOB: 1946/07/03   Cancelled Treatment:    Reason Eval/Treat Not Completed: Medical issues which prohibited therapy.  Events noted.  Pt now intubated.  Will check back and will see when medically appropriate.  Jeani Hawking, OTR/L Acute Rehabilitation Services Pager [phone] Office [phone]   Jeani Hawking M 06/10/2018, 5:58 AM

## 2018-06-10 NOTE — Progress Notes (Addendum)
Pt images reviewed yesterday.  Anatomically speaking may be candidate for carotid stent.  Clinical events of yesterday noted.  Pt currently in no condition for carotid intervention.  Would continue Plavix/aspirin.  Most likely will defer any carotid intervention for 4-6 weeks in light of developments yesterday.  Please reconsult if pt overall clinical condition dramatically improves including mental status VDRF renal failure   Fabienne Bruns, MD [facility] Office: [phone] Pager: [phone]

## 2018-06-10 NOTE — Progress Notes (Addendum)
Progress Note  Patient Name: Ian Ramos Date of Encounter: 06/10/2018  Primary Cardiologist:   No primary care provider on file.  Subjective   On 06/09/2018 he was found unresponsive in his chair on the medical floor with a blood pressure of 40s over 30s and a heart rate in the 50s.  A code was called patient received atropine and was intubated for respiratory support.  He became oliguric overnight. Worsening kidney function. On levophed. CVP 2.  Inpatient Medications    Scheduled Meds: .  stroke: mapping our early stages of recovery book   Does not apply Once  . aspirin  81 mg Per Tube Daily  . atorvastatin  80 mg Per Tube q1800  . chlorhexidine  15 mL Mouth Rinse BID  . chlorhexidine gluconate (MEDLINE KIT)  15 mL Mouth Rinse BID  . Chlorhexidine Gluconate Cloth  6 each Topical Daily  . clopidogrel  75 mg Per Tube Daily  . enoxaparin (LOVENOX) injection  40 mg Subcutaneous Q24H  . insulin aspart  0-15 Units Subcutaneous Q4H  . iopamidol  100 mL Intravenous Once  . mouth rinse  15 mL Mouth Rinse q12n4p  . mouth rinse  15 mL Mouth Rinse 10 times per day  . sodium chloride flush  10-40 mL Intracatheter Q12H  . sodium chloride flush  3 mL Intravenous Q12H   Continuous Infusions: . sodium chloride 10 mL/hr at 06/09/18 1610  . sodium chloride 75 mL/hr at 06/10/18 0400  . dexmedetomidine (PRECEDEX) IV infusion 0.8 mcg/kg/hr (06/10/18 0734)  . famotidine (PEPCID) IV Stopped (06/09/18 1609)  . norepinephrine (LEVOPHED) Adult infusion 7 mcg/min (06/10/18 0607)   PRN Meds: sodium chloride, acetaminophen, fentaNYL (SUBLIMAZE) injection, sodium chloride flush, sodium chloride flush   Vital Signs    Vitals:   06/10/18 0447 06/10/18 0600 06/10/18 0734 06/10/18 0802  BP:  122/72 (!) 162/73   Pulse:  (!) 55 70   Resp:  16 19   Temp: 97.8 F (36.6 C)   98.7 F (37.1 C)  TempSrc: Oral   Oral  SpO2:  100% 100%   Weight:      Height:        Intake/Output Summary (Last 24  hours) at 06/10/2018 0935 Last data filed at 06/10/2018 0500 Gross per 24 hour  Intake 1486.36 ml  Output 1150 ml  Net 336.36 ml   Filed Weights   05/31/18 0425 06/06/18 0432 06/06/18 0605  Weight: 73.4 kg 73.4 kg 74.7 kg   Telemetry    NSR - Personally Reviewed  ECG    NA - Personally Reviewed  Physical Exam   GEN: Confused, not answering questions appropriately Neck: No  JVD Cardiac: RRR, no murmurs, rubs, or gallops.  Respiratory: Clear  to auscultation bilaterally. GI: Soft, nontender, non-distended  MS: No  edema; No deformity. Neuro:  Nonfocal  Psych: Normal affect   Labs    Chemistry Recent Labs  Lab 06/08/18 0433 06/09/18 0401 06/10/18 0327  NA 136 136 134*  K 4.4 4.4 3.9  CL 104 103 101  CO2 22 21* 20*  GLUCOSE 111* 93 157*  BUN 23 26* 36*  CREATININE 1.46* 1.54* 1.98*  CALCIUM 8.7* 9.2 8.2*  GFRNONAA 46* 43* 32*  GFRAA 53* 50* 37*  ANIONGAP 10 12 13     Hematology Recent Labs  Lab 06/07/18 0226 06/09/18 0401 06/10/18 0327  WBC 9.1 11.5* 18.5*  RBC 3.26* 3.89* 3.71*  HGB 9.5* 11.5* 11.0*  HCT 30.0* 34.5* 33.7*  MCV 92.0 88.7 90.8  MCH 29.1 29.6 29.6  MCHC 31.7 33.3 32.6  RDW 12.1 12.0 12.1  PLT 176 191 195   Cardiac EnzymesNo results for input(s): TROPONINI in the last 168 hours. No results for input(s): TROPIPOC in the last 168 hours.   BNPNo results for input(s): BNP, PROBNP in the last 168 hours.   DDimer No results for input(s): DDIMER in the last 168 hours.   Radiology    Dg Chest Port 1 View  Result Date: 06/09/2018 CLINICAL DATA:  Central line placement confirmation EXAM: PORTABLE CHEST 1 VIEW COMPARISON:  06/09/2018 1645 hours FINDINGS: Endotracheal and NG tubes stable. Left subclavian central venous catheter placed. Tip is in the mid SVC. There is no ensuing left pneumothorax. Normal heart size. Clear and under aerated lungs. IMPRESSION: Left subclavian central venous catheter placement with its tip in the mid SVC and no  pneumothorax per Clear lungs. Stable endotracheal and NG tubes. Electronically Signed   By: Marybelle Killings M.D.   On: 06/09/2018 18:09   Dg Chest Port 1 View  Result Date: 06/09/2018 CLINICAL DATA:  72 year old male with acute respiratory failure. Subsequent encounter. EXAM: PORTABLE CHEST 1 VIEW COMPARISON:  05/28/2018 CT and chest x-ray. FINDINGS: Endotracheal tube tip centrally located 4.9 cm above the carina. Nasogastric tube tip gastric fundus level. No infiltrate or congestive heart failure. No obvious pneumothorax. Heart size within normal limits. Calcified mildly tortuous aorta. IMPRESSION: 1. No infiltrate or congestive heart failure. 2.  Aortic Atherosclerosis (ICD10-I70.0). Electronically Signed   By: Genia Del M.D.   On: 06/09/2018 16:57   Cardiac Studies   Cardiac cath:  Left Anterior Descending  Prox LAD lesion 75% stenosed  Prox LAD lesion is 75% stenosed. The lesion is eccentric.  Second Septal Branch  Left Circumflex  Second Obtuse Marginal Branch  Ost 2nd Mrg to 2nd Mrg lesion 75% stenosed  Ost 2nd Mrg to 2nd Mrg lesion is 75% stenosed.  Right Coronary Artery  Prox RCA to Mid RCA lesion 30% stenosed  Prox RCA to Mid RCA lesion is 30% stenosed.  Acute Marginal Branch  Collaterals  Acute Mrg filled by collaterals from 2nd Sept.    Acute Mrg lesion 100% stenosed  Acute Mrg lesion is 100% stenosed. The lesion is chronically occluded with left-to-right collateral flow.     Patient Profile     72 y.o. male with a history of chronic kidney disease stage III and cervical disc disease who underwent recent cervical laminectomy on 05/23/2018 without any complications. On 05/28/2018 he awakened at 4 AM with severe substernal chest pain and tried to sit up the pain became more severe. There were no associated symptoms of shortness of breath, diaphoresis or nausea. He called EMS and was given aspirin and chest pain resolved but he continued to have left shoulder pain although he  says that he has intermittent shoulder pain chronically  Assessment & Plan    Hypotension, possible Cardiogenic shock: The patient is on support with Levophed, CVP is 2, I will obtain Coox and consider starting milrinone if low.  CAD as above with plans for optimal medical management with PCI if clear anginal refractory symptoms.  This is most probably the culprit for the above. If Crea improves we will discuss PCI/LAD with the interventional team.  Acute on chronic kidney insufficiency: Crea 1.7>1.4>1.5-->1.98, with oliguria, most probably sec to hypotension yesterday, imdur was held, I agree with bolus and hydration 75 cc/hr given oliguria and CVP 2, CXR doesn't  show significant CHF.  Carotid stenosis: Dr Glory Rosebush asked Dr Oneida Alar to evaluate him for possible carotid stenting.  If this is possible, neurology would like this done within the 2-week window. He was started on Plavix given the might be a candidate for carotid stenting.  Now on hold.  For questions or updates, please contact [place] Please consult [email or web address] for contact info under Cardiology/STEMI.   Signed, Ena Dawley, MD  06/10/2018, 9:35 AM

## 2018-06-11 ENCOUNTER — Inpatient Hospital Stay (HOSPITAL_COMMUNITY): Payer: BLUE CROSS/BLUE SHIELD

## 2018-06-11 DIAGNOSIS — J9600 Acute respiratory failure, unspecified whether with hypoxia or hypercapnia: Secondary | ICD-10-CM

## 2018-06-11 DIAGNOSIS — J9601 Acute respiratory failure with hypoxia: Secondary | ICD-10-CM

## 2018-06-11 DIAGNOSIS — N171 Acute kidney failure with acute cortical necrosis: Secondary | ICD-10-CM

## 2018-06-11 DIAGNOSIS — N184 Chronic kidney disease, stage 4 (severe): Secondary | ICD-10-CM

## 2018-06-11 LAB — BASIC METABOLIC PANEL
ANION GAP: 11 (ref 5–15)
BUN: 29 mg/dL — ABNORMAL HIGH (ref 8–23)
CO2: 17 mmol/L — AB (ref 22–32)
Calcium: 8.1 mg/dL — ABNORMAL LOW (ref 8.9–10.3)
Chloride: 106 mmol/L (ref 98–111)
Creatinine, Ser: 1.56 mg/dL — ABNORMAL HIGH (ref 0.61–1.24)
GFR calc Af Amer: 49 mL/min — ABNORMAL LOW (ref >60)
GFR, EST NON AFRICAN AMERICAN: 42 mL/min — AB (ref >60)
GLUCOSE: 115 mg/dL — AB (ref 70–99)
Potassium: 3.8 mmol/L (ref 3.5–5.1)
Sodium: 134 mmol/L — ABNORMAL LOW (ref 135–145)

## 2018-06-11 LAB — CULTURE, RESPIRATORY: CULTURE: NORMAL

## 2018-06-11 LAB — CBC WITH DIFFERENTIAL/PLATELET
ABS IMMATURE GRANULOCYTES: 0.1 10*3/uL (ref 0.0–0.1)
BASOS PCT: 0 %
Basophils Absolute: 0 10*3/uL (ref 0.0–0.1)
Eosinophils Absolute: 0.1 10*3/uL (ref 0.0–0.7)
Eosinophils Relative: 1 %
HEMATOCRIT: 30.5 % — AB (ref 39.0–52.0)
Hemoglobin: 10 g/dL — ABNORMAL LOW (ref 13.0–17.0)
IMMATURE GRANULOCYTES: 0 %
LYMPHS ABS: 1.1 10*3/uL (ref 0.7–4.0)
LYMPHS PCT: 10 %
MCH: 29.9 pg (ref 26.0–34.0)
MCHC: 32.8 g/dL (ref 30.0–36.0)
MCV: 91 fL (ref 78.0–100.0)
MONOS PCT: 8 %
Monocytes Absolute: 0.9 10*3/uL (ref 0.1–1.0)
NEUTROS ABS: 8.9 10*3/uL — AB (ref 1.7–7.7)
NEUTROS PCT: 81 %
PLATELETS: 146 10*3/uL — AB (ref 150–400)
RBC: 3.35 MIL/uL — ABNORMAL LOW (ref 4.22–5.81)
RDW: 12 % (ref 11.5–15.5)
WBC: 11.1 10*3/uL — ABNORMAL HIGH (ref 4.0–10.5)

## 2018-06-11 LAB — GLUCOSE, CAPILLARY
GLUCOSE-CAPILLARY: 106 mg/dL — AB (ref 70–99)
GLUCOSE-CAPILLARY: 115 mg/dL — AB (ref 70–99)
GLUCOSE-CAPILLARY: 126 mg/dL — AB (ref 70–99)
Glucose-Capillary: 107 mg/dL — ABNORMAL HIGH (ref 70–99)
Glucose-Capillary: 92 mg/dL (ref 70–99)

## 2018-06-11 LAB — PHOSPHORUS: Phosphorus: 3 mg/dL (ref 2.5–4.6)

## 2018-06-11 LAB — MAGNESIUM: Magnesium: 1.6 mg/dL — ABNORMAL LOW (ref 1.7–2.4)

## 2018-06-11 LAB — CULTURE, RESPIRATORY W GRAM STAIN

## 2018-06-11 MED ORDER — CLOPIDOGREL BISULFATE 75 MG PO TABS
75.0000 mg | ORAL_TABLET | Freq: Every day | ORAL | Status: DC
  Administered 2018-06-11 – 2018-06-19 (×9): 75 mg
  Filled 2018-06-11 (×10): qty 1

## 2018-06-11 MED ORDER — VITAL AF 1.2 CAL PO LIQD
1000.0000 mL | ORAL | Status: DC
  Administered 2018-06-11 (×2): 1000 mL
  Filled 2018-06-11: qty 1000

## 2018-06-11 MED ORDER — ASPIRIN 81 MG PO CHEW
162.0000 mg | CHEWABLE_TABLET | Freq: Every day | ORAL | Status: DC
  Administered 2018-06-11 – 2018-06-19 (×9): 162 mg
  Filled 2018-06-11 (×10): qty 2

## 2018-06-11 MED ORDER — DEXAMETHASONE SODIUM PHOSPHATE 4 MG/ML IJ SOLN
4.0000 mg | Freq: Four times a day (QID) | INTRAMUSCULAR | Status: DC
  Administered 2018-06-11 – 2018-06-12 (×3): 4 mg via INTRAVENOUS
  Filled 2018-06-11 (×3): qty 1

## 2018-06-11 NOTE — Progress Notes (Signed)
PT Cancellation Note  Patient Details Name: Ian Ramos MRN: [id number] DOB: [birth year]   Cancelled Treatment:    Reason Eval/Treat Not Completed: Other (comment). Pt remains on vent but RN reports attempting to wean and extubate hopefully this AM. RN asked pt to wait until after extubation. Acute PT to return as able, as appropriate.  Ian Ramos, PT, DPT Acute Rehabilitation Services Pager #: [phone] Office #: [phone]    Ian Ramos 06/11/2018, 10:21 AM

## 2018-06-11 NOTE — Progress Notes (Signed)
Soft cervical collar changed, skin assessed underneath is intact.

## 2018-06-11 NOTE — Progress Notes (Signed)
Pt w/ VC 2.0L, however no cuff leak noted.  Per MD, hold off extubating for now.  RN at bedside and aware.

## 2018-06-11 NOTE — Progress Notes (Signed)
Cortrak Tube Team Note:  Consult received to place a Cortrak feeding tube.   A 10 F Cortrak tube was placed in the left nare and secured with a nasal bridle at 80 cm. Per the Cortrak monitor reading the tube tip is post pyloric.   X-ray is required, abdominal x-ray has been ordered by the Cortrak team. Please confirm tube placement before using the Cortrak tube.   If the tube becomes dislodged please keep the tube and contact the Cortrak team at [email or web address] (password [email or web address]) for replacement.  If after hours and replacement cannot be delayed, place a NG tube and confirm placement with an abdominal x-ray.    Betsey Holiday MS, RD, LDN Pager #- [phone] Office#- [phone] After Hours Pager: [phone]

## 2018-06-11 NOTE — Progress Notes (Addendum)
Progress Note  Patient Name: Ian Ramos Date of Encounter: 06/11/2018  Primary Cardiologist:   No primary care provider on file.  Subjective   On 06/09/2018 he was found unresponsive in his chair on the medical floor with a blood pressure of 40s over 30s and a heart rate in the 50s.  A code was called patient received atropine and was intubated for respiratory support.  He became oliguric overnight. Worsening kidney function. On levophed. CVP 2. Coox 67%. Improving vent parameters, plan to extubation today.   Inpatient Medications    Scheduled Meds: .  stroke: mapping our early stages of recovery book   Does not apply Once  . aspirin  162 mg Per Tube Daily  . atorvastatin  80 mg Per Tube q1800  . chlorhexidine gluconate (MEDLINE KIT)  15 mL Mouth Rinse BID  . Chlorhexidine Gluconate Cloth  6 each Topical Daily  . clopidogrel  75 mg Per Tube Daily  . enoxaparin (LOVENOX) injection  40 mg Subcutaneous Q24H  . insulin aspart  0-15 Units Subcutaneous Q4H  . iopamidol  100 mL Intravenous Once  . mouth rinse  15 mL Mouth Rinse 10 times per day  . sodium chloride flush  10-40 mL Intracatheter Q12H  . sodium chloride flush  3 mL Intravenous Q12H   Continuous Infusions: . sodium chloride 10 mL/hr at 06/09/18 1610  . sodium chloride 75 mL/hr at 06/11/18 0800  . dexmedetomidine (PRECEDEX) IV infusion 0.4 mcg/kg/hr (06/11/18 0800)  . famotidine (PEPCID) IV Stopped (06/10/18 1803)  . norepinephrine (LEVOPHED) Adult infusion 5 mcg/min (06/11/18 0800)   PRN Meds: sodium chloride, acetaminophen, fentaNYL (SUBLIMAZE) injection, sodium chloride flush, sodium chloride flush   Vital Signs    Vitals:   06/11/18 0830 06/11/18 0831 06/11/18 0845 06/11/18 0900  BP: (!) 156/61  (!) 151/61 (!) 134/58  Pulse: (!) 55 (!) 57 (!) 54 (!) 51  Resp: 18 18 18 16   Temp:      TempSrc:      SpO2: 100% 100% 100% 100%  Weight:      Height:        Intake/Output Summary (Last 24 hours) at  06/11/2018 1003 Last data filed at 06/11/2018 0800 Gross per 24 hour  Intake 2240 ml  Output 1535 ml  Net 705 ml   Filed Weights   05/31/18 0425 06/06/18 0432 06/06/18 0605  Weight: 73.4 kg 73.4 kg 74.7 kg   Telemetry    NSR - Personally Reviewed  ECG    NA - Personally Reviewed  Physical Exam   GEN: Confused, not answering questions appropriately Neck: No  JVD Cardiac: RRR, no murmurs, rubs, or gallops.  Respiratory: Clear  to auscultation bilaterally. GI: Soft, nontender, non-distended  MS: No  edema; No deformity. Neuro:  Nonfocal  Psych: Normal affect   Labs    Chemistry Recent Labs  Lab 06/09/18 0401 06/10/18 0327 06/11/18 0302  NA 136 134* 134*  K 4.4 3.9 3.8  CL 103 101 106  CO2 21* 20* 17*  GLUCOSE 93 157* 115*  BUN 26* 36* 29*  CREATININE 1.54* 1.98* 1.56*  CALCIUM 9.2 8.2* 8.1*  GFRNONAA 43* 32* 42*  GFRAA 50* 37* 49*  ANIONGAP 12 13 11     Hematology Recent Labs  Lab 06/09/18 0401 06/10/18 0327 06/11/18 0302  WBC 11.5* 18.5* 11.1*  RBC 3.89* 3.71* 3.35*  HGB 11.5* 11.0* 10.0*  HCT 34.5* 33.7* 30.5*  MCV 88.7 90.8 91.0  MCH 29.6 29.6 29.9  MCHC 33.3 32.6 32.8  RDW 12.0 12.1 12.0  PLT 191 195 146*   Cardiac EnzymesNo results for input(s): TROPONINI in the last 168 hours. No results for input(s): TROPIPOC in the last 168 hours.   BNPNo results for input(s): BNP, PROBNP in the last 168 hours.   DDimer No results for input(s): DDIMER in the last 168 hours.   Radiology    Dg Chest Port 1 View  Result Date: 06/09/2018 CLINICAL DATA:  Central line placement confirmation EXAM: PORTABLE CHEST 1 VIEW COMPARISON:  06/09/2018 1645 hours FINDINGS: Endotracheal and NG tubes stable. Left subclavian central venous catheter placed. Tip is in the mid SVC. There is no ensuing left pneumothorax. Normal heart size. Clear and under aerated lungs. IMPRESSION: Left subclavian central venous catheter placement with its tip in the mid SVC and no pneumothorax  per Clear lungs. Stable endotracheal and NG tubes. Electronically Signed   By: Marybelle Killings M.D.   On: 06/09/2018 18:09   Dg Chest Port 1 View  Result Date: 06/09/2018 CLINICAL DATA:  72 year old male with acute respiratory failure. Subsequent encounter. EXAM: PORTABLE CHEST 1 VIEW COMPARISON:  05/28/2018 CT and chest x-ray. FINDINGS: Endotracheal tube tip centrally located 4.9 cm above the carina. Nasogastric tube tip gastric fundus level. No infiltrate or congestive heart failure. No obvious pneumothorax. Heart size within normal limits. Calcified mildly tortuous aorta. IMPRESSION: 1. No infiltrate or congestive heart failure. 2.  Aortic Atherosclerosis (ICD10-I70.0). Electronically Signed   By: Genia Del M.D.   On: 06/09/2018 16:57   Dg Abd Portable 1v  Result Date: 06/10/2018 CLINICAL DATA:  Status post OG tube placement. EXAM: PORTABLE ABDOMEN - 1 VIEW COMPARISON:  None. FINDINGS: OG tube is in place with the tip just within the stomach. Side port is in the distal esophagus. Recommend advancement of 5-6 cm. IMPRESSION: As above. Electronically Signed   By: Inge Rise M.D.   On: 06/10/2018 10:27   Cardiac Studies   Cardiac cath:  Left Anterior Descending  Prox LAD lesion 75% stenosed  Prox LAD lesion is 75% stenosed. The lesion is eccentric.  Second Septal Branch  Left Circumflex  Second Obtuse Marginal Branch  Ost 2nd Mrg to 2nd Mrg lesion 75% stenosed  Ost 2nd Mrg to 2nd Mrg lesion is 75% stenosed.  Right Coronary Artery  Prox RCA to Mid RCA lesion 30% stenosed  Prox RCA to Mid RCA lesion is 30% stenosed.  Acute Marginal Branch  Collaterals  Acute Mrg filled by collaterals from 2nd Sept.    Acute Mrg lesion 100% stenosed  Acute Mrg lesion is 100% stenosed. The lesion is chronically occluded with left-to-right collateral flow.     Patient Profile     72 y.o. male with a history of chronic kidney disease stage III and cervical disc disease who underwent recent  cervical laminectomy on 05/23/2018 without any complications. On 05/28/2018 he awakened at 4 AM with severe substernal chest pain and tried to sit up the pain became more severe. There were no associated symptoms of shortness of breath, diaphoresis or nausea. He called EMS and was given aspirin and chest pain resolved but he continued to have left shoulder pain although he says that he has intermittent shoulder pain chronically  Assessment & Plan    Hypotension, coox 67, this most most probably related to respiratory failure rather than CHF  The patient continues to on Levophed, CVP is 2. Continue to hold diuretics.  CAD as above with plans for optimal medical  management with PCI if clear anginal refractory symptoms.  This is most probably the culprit for the above. If Crea improves we will discuss PCI/LAD with the interventional team.  Acute on chronic kidney insufficiency: Crea 1.7>1.4>1.5-->1.98->1.56, with oliguria, most probably sec to hypotension yesterday, imdur was held, I agree with bolus and hydration 75 cc/hr given oliguria and CVP 2, CXR doesn't show significant CHF.  Carotid stenosis: Dr Glory Rosebush asked Dr Oneida Alar to evaluate him for possible carotid stenting.  If this is possible, neurology would like this done within the 2-week window. He was started on Plavix given the might be a candidate for carotid stenting.  Now on hold.  For questions or updates, please contact [place] Please consult [email or web address] for contact info under Cardiology/STEMI.   Signed, Ena Dawley, MD  06/11/2018, 10:03 AM

## 2018-06-11 NOTE — Progress Notes (Signed)
Initial Nutrition Assessment  DOCUMENTATION CODES:   Not applicable  INTERVENTION:   Initiate Vital AF 1.2 @ 60 ml/hr (1440 ml/day)  Provides: 1728 kcal, 108 grams protein, and 1167 ml free water.    NUTRITION DIAGNOSIS:   Inadequate oral intake related to inability to eat as evidenced by NPO status.  GOAL:   Patient will meet greater than or equal to 90% of their needs  MONITOR:   Vent status, TF tolerance  REASON FOR ASSESSMENT:   Consult Enteral/tube feeding initiation and management  ASSESSMENT:   Pt with PMH of stage III CKD, s/p cervial laminectomy 8/19, esophageal dysmotility that resolved admitted with new cardiomyopathy with EF 35% s/p cath 9/5, 9/7 MRI shows R MCA/PCA watershed infarcts, 9/8 pt vomited twice while eating, 9/9 found unresponsive and pt tx to ICU and intubated.    9/11 Cortrak placed Spoke with MD possible extubation but will feed either way Pt awake and alert on vent during visit. He is unsure of weight hx. Reports good appetite. Sister at bedside but is unsure of hx.   Patient is currently intubated on ventilator support MV: 10.6 L/min Temp (24hrs), Avg:98.8 F (37.1 C), Min:98.5 F (36.9 C), Max:99 F (37.2 C)  Medications reviewed and include: SSI Labs reviewed: Na 134 (L)   I/O: +2111 ml since admit UOP: 1385 ml x 24 hrs    NUTRITION - FOCUSED PHYSICAL EXAM:    Most Recent Value  Orbital Region  No depletion  Upper Arm Region  No depletion  Thoracic and Lumbar Region  Unable to assess  Buccal Region  Unable to assess  Temple Region  Mild depletion  Clavicle Bone Region  No depletion  Clavicle and Acromion Bone Region  No depletion  Scapular Bone Region  Unable to assess  Dorsal Hand  Unable to assess  Patellar Region  No depletion  Anterior Thigh Region  No depletion  Posterior Calf Region  No depletion  Edema (RD Assessment)  None  Hair  Reviewed  Eyes  Reviewed  Mouth  Unable to assess  Skin  Reviewed  Nails   Unable to assess       Diet Order:   Diet Order            Diet NPO time specified  Diet effective now              EDUCATION NEEDS:   No education needs have been identified at this time  Skin:  Skin Assessment: Reviewed RN Assessment  Last BM:  9/8 smear  Height:   Ht Readings from Last 1 Encounters:  05/28/18 5' 7.5" (1.715 m)    Weight:   Wt Readings from Last 1 Encounters:  06/06/18 74.7 kg    Ideal Body Weight:  68.6 kg  BMI:  Body mass index is 25.41 kg/m.  Estimated Nutritional Needs:   Kcal:  1734  Protein:  90-110 grams  Fluid:  > 1.7 L/day  Kendell Bane RD, LDN, CNSC [phone] Pager [phone] After Hours Pager

## 2018-06-11 NOTE — Plan of Care (Signed)
  Problem: Health Behavior/Discharge Planning: Goal: Ability to manage health-related needs will improve Outcome: Progressing   Problem: Clinical Measurements: Goal: Ability to maintain clinical measurements within normal limits will improve Outcome: Progressing   

## 2018-06-11 NOTE — Progress Notes (Signed)
Ian Ramos  DPT:[id number] DOB: 10-21-1945 DOA: 05/28/2018 PCP: Mliss Sax, MD    LOS: 7 days   Reason for Consult / Chief Complaint:  Respiratory arrest   Consulting MD and date:  gruntz   HPI/Summary of hospital stay:  72 year old male patient history of stage III chronic kidney disease, cervical spine stenosis status post recent cervical laminectomy August 2019.  Presented to the emergency room on 8/28 with chief complaint of chest pain, shoulder pain and substernal discomfort..  Was found to have some esophageal thickening, possibly some esophageal dysmotility, etiology was never clearly identified but resolved.  He was found to have new dilated cardiomyopathy with EF 35% and diffuse hypokinesis, and bundle branch block, but it was unclear as to whether or not if this was the primary cause of his discomfort.  The plan was to proceed with iliac ischemia evaluation.  He underwent cardiac catheterization on 9/5: Showed moderate obstructive coronary artery disease with 75% occluded proximal LAD 75% OM 2 occluded RV marginal branch with left to right collaterals.  This was not considered critical CAD and medical management was recommended. On 9/6 the patient developed acute mental status change.  On initial eval he was confused, preservation, and had right upper extremity drift and right visual field cut.  CT brain history left temporal lobe infarct felt possibly embolic in etiology.  Carotid ultrasound showed likely left carotid occlusion with high-grade right carotid stenosis.  Vascular surgery was consulted.  Plavix was recommended and delaying carotid endarterectomy for further recovery was felt best course of action. 9/7: Continues to demonstrate aphasia.  Also with ongoing left upper extremity weakness.  MRI showed right MCA/PCA watershed and right parietal punctate infarcts felt more consistent with high-grade ICA stenosis on the left. 9/8: Patient vomited twice during the  day while family was assisting with feeding 9/9: Found unresponsive in chair.  Blood pressure 48/32 heart rate 58 area not clear that he lost pulses.  Pulse oximetry not recorded.  He was apneic, Code blue was called patient was intubated and transferred to the intensive care.  Subjective:  Weaning well this AM Upper airway bleeding from NGT insertion attempts  Objective   Blood pressure (!) 163/67, pulse 72, temperature 98.5 F (36.9 C), temperature source Oral, resp. rate 19, height 5' 7.5" (1.715 m), weight 74.7 kg, SpO2 100 %. CVP:  [1 mmHg-6 mmHg] 1 mmHg  Vent Mode: PSV;CPAP FiO2 (%):  [40 %] 40 % Set Rate:  [15 bmp] 15 bmp Vt Set:  [540 mL] 540 mL PEEP:  [5 cmH20] 5 cmH20 Pressure Support:  [5 cmH20-10 cmH20] 5 cmH20 Plateau Pressure:  [14 cmH20-16 cmH20] 16 cmH20   Intake/Output Summary (Last 24 hours) at 06/11/2018 1025 Last data filed at 06/11/2018 0900 Gross per 24 hour  Intake 2338.54 ml  Output 1610 ml  Net 728.54 ml   Filed Weights   05/31/18 0425 06/06/18 0432 06/06/18 0605  Weight: 73.4 kg 73.4 kg 74.7 kg   Examination:  General appearance: Well appearing, NAD Eyes: WNL HENT: [place]/AT, PERRL, EOM-I and MMM Neck: Trachea midline; soft C-collar is in place Lungs: CTA bilaterally CV: RRR, Nl S1/S2 and -M/R/G Abdomen: Soft, NT, ND and +BS Extremities: No peripheral edema or radial and DP pulses present bilaterally  Skin: Normal temperature, turgor and texture; no rash Psych: unable to assess fully, no evidence of acute delirium  Neuro: alert, following commands  Consults: date of consult/date signed off & final recs:  GI  8/29: Found asymptomatic esophagitis.  Also evidence of esophageal dysmotility on esophagram.  GI signed off on 8/30 with final recommendations for daily PPI x4 weeks and routine cancer screening Cardiology consulted 9/3 for chest pain.  Underwent left heart cath which showed diffuse coronary artery disease but noncritical recommended medical  therapy, because he had bradycardia and CKD ARB was held he was recommended to continue hydralazine, nitrates and diuretics 9/7: Neurology consulted for stroke Vascular surgery consulted 9/6: For bilateral carotid artery disease: Started on Plavix.  Recommending gentle hydration.  Considering the patient for carotid stenting in the next 2 weeks  Procedures:  Left heart cath 9/5 showing moderate noncritical coronary artery disease.  75% occluded proximal LAD 75% occluded OM.  Medical management was recommended   Significant Diagnostic Tests: Echocardiogram 8/29:EF 35 to 40%.  Diffuse hypokinesis.  Doppler parameters consistent with grade 1 diastolic dysfunction. 9/6: MRI brainExtensive motion artifact.  Minimal flow related signal in the left cervical ICA and absent flow of the left ICA representing occlusion or proximal high-grade stenosis.  There is a large anterior communicating artery providing collateral circulation to the left MCA and left PCA. 9/6: CT brain: Subtle hypodensity in the anterior mid left temporal lobe 9/4: Nuclear stress test EF 39% findings were consistent with ischemia felt to be intermittent risk 8/30: Esophagram showing moderate esophageal dysmotility  Micro Data: Respiratory culture 9/9>>>  Antimicrobials:  Vancomycin 9/9 Zosyn 9/9  Resolved Hospital Problem list    Assessment & Plan:   Assessment/Plan: Acute hypoxemic respiratory failure requiring intubation and mechanical ventilation.  Unclear at this time what the etiology of his respiratory arrest was.  Additionally low blood pressures leading to poor cerebral perfusion due to his bilateral carotid stenosis could be a factor.   - Maintain on PS for now - No extubation given overnight events with upper airway bleeding from NGT placement attempts - ABG and CXR in AM - Realistically if no bleeding overnight then extubation in AM  Possible aspiration pneumonia, respiratory cultures with gram-negative rods and  gram-positive cocci.  No evidence of infection, will not start abx.  Dilated cardiomyopathy with a reduced ejection fraction, acute systolic heart failure Acute bilateral CVA, bilateral carotid stenosis, altered mental status could be related to hypotension and the progressive increase in heart failure medications.  Neurology recommending systolic blood pressures between 130-150 which complicates management of his heart failure. - Titrate levophed for MAP of 65 mmHg - Tele monitoring  Vomiting, history of esophageal dysmotility Cervical myelopathy status post laminectomy Anemia of chronic disease - TF per nutrition - Keep NGT - Will need SLP post extubation given history  Urinary retention. - Keep foley in place at this time  Bradycardia LBBB - Tele monitoring    Disposition / Summary of Today's Plan 06/11/18   Keep pressure up. Awaiting cultures. Needs enteral access.   Best Practice / Goals of Care / Disposition.   DVT prophylaxis: LMWH GI prophylaxis: PPI VT  Diet: NPO, start tubefeeds Mobility: BR Code Status: full Family Communication: full   Labs    CBC: Recent Labs  Lab 06/06/18 0905 06/07/18 0226 06/09/18 0401 06/10/18 0327 06/11/18 0302  WBC 11.8* 9.1 11.5* 18.5* 11.1*  NEUTROABS  --   --   --  16.1* 8.9*  HGB 10.5* 9.5* 11.5* 11.0* 10.0*  HCT 32.9* 30.0* 34.5* 33.7* 30.5*  MCV 92.9 92.0 88.7 90.8 91.0  PLT 199 176 191 195 146*   Basic Metabolic Panel: Recent Labs  Lab 06/07/18 0226  06/08/18 0433 06/09/18 0401 06/10/18 0327 06/11/18 0302  NA 136 136 136 134* 134*  K 4.4 4.4 4.4 3.9 3.8  CL 104 104 103 101 106  CO2 24 22 21* 20* 17*  GLUCOSE 116* 111* 93 157* 115*  BUN 29* 23 26* 36* 29*  CREATININE 1.70* 1.46* 1.54* 1.98* 1.56*  CALCIUM 8.3* 8.7* 9.2 8.2* 8.1*   GFR: Estimated Creatinine Clearance: 40.1 mL/min (A) (by C-G formula based on SCr of 1.56 mg/dL (H)). Recent Labs  Lab 06/07/18 0226 06/09/18 0401 06/10/18 0327 06/11/18 0302   WBC 9.1 11.5* 18.5* 11.1*   Liver Function Tests: No results for input(s): AST, ALT, ALKPHOS, BILITOT, PROT, ALBUMIN in the last 168 hours. No results for input(s): LIPASE, AMYLASE in the last 168 hours. No results for input(s): AMMONIA in the last 168 hours.   ABG    Component Value Date/Time   PHART 7.417 06/09/2018 1648   PCO2ART 34.1 06/09/2018 1648   PO2ART 360 (H) 06/09/2018 1648   HCO3 21.5 06/09/2018 1648   TCO2 24 06/25/2011 1054   ACIDBASEDEF 2.3 (H) 06/09/2018 1648   O2SAT 67.2 06/10/2018 1110    Coagulation Profile: No results for input(s): INR, PROTIME in the last 168 hours.   Cardiac Enzymes: No results for input(s): CKTOTAL, CKMB, CKMBINDEX, TROPONINI in the last 168 hours.   HbA1C: Hgb A1c MFr Bld  Date/Time Value Ref Range Status  06/06/2018 08:58 AM 6.1 (H) 4.8 - 5.6 % Final    Comment:    (NOTE) Pre diabetes:          5.7%-6.4% Diabetes:              >6.4% Glycemic control for   <7.0% adults with diabetes    CBG: Recent Labs  Lab 06/10/18 1136 06/10/18 1628 06/10/18 1957 06/11/18 0018 06/11/18 0807  GLUCAP 119* 133* 99 115* 107*   Family updated bedside.  The patient is critically ill with multiple organ systems failure and requires high complexity decision making for assessment and support, frequent evaluation and titration of therapies, application of advanced monitoring technologies and extensive interpretation of multiple databases.   Critical Care Time devoted to patient care services described in this note is  36  Minutes. This time reflects time of care of this signee Dr Koren Bound. This critical care time does not reflect procedure time, or teaching time or supervisory time of PA/NP/Med student/Med Resident etc but could involve care discussion time.  Alyson Reedy, M.D. [facility] Pulmonary/Critical Care Medicine. Pager: [phone]. After hours pager: [phone].

## 2018-06-12 ENCOUNTER — Inpatient Hospital Stay (HOSPITAL_COMMUNITY): Payer: BLUE CROSS/BLUE SHIELD

## 2018-06-12 LAB — BASIC METABOLIC PANEL
ANION GAP: 7 (ref 5–15)
BUN: 28 mg/dL — ABNORMAL HIGH (ref 8–23)
CHLORIDE: 109 mmol/L (ref 98–111)
CO2: 20 mmol/L — ABNORMAL LOW (ref 22–32)
Calcium: 8 mg/dL — ABNORMAL LOW (ref 8.9–10.3)
Creatinine, Ser: 1.38 mg/dL — ABNORMAL HIGH (ref 0.61–1.24)
GFR calc Af Amer: 57 mL/min — ABNORMAL LOW (ref >60)
GFR calc non Af Amer: 49 mL/min — ABNORMAL LOW (ref >60)
GLUCOSE: 257 mg/dL — AB (ref 70–99)
POTASSIUM: 4.1 mmol/L (ref 3.5–5.1)
Sodium: 136 mmol/L (ref 135–145)

## 2018-06-12 LAB — PHOSPHORUS
PHOSPHORUS: 3.2 mg/dL (ref 2.5–4.6)
PHOSPHORUS: 1.5 mg/dL — AB (ref 2.5–4.6)

## 2018-06-12 LAB — CBC
HCT: 28.3 % — ABNORMAL LOW (ref 39.0–52.0)
HEMOGLOBIN: 9.1 g/dL — AB (ref 13.0–17.0)
MCH: 29.3 pg (ref 26.0–34.0)
MCHC: 32.2 g/dL (ref 30.0–36.0)
MCV: 91 fL (ref 78.0–100.0)
Platelets: 128 10*3/uL — ABNORMAL LOW (ref 150–400)
RBC: 3.11 MIL/uL — AB (ref 4.22–5.81)
RDW: 12 % (ref 11.5–15.5)
WBC: 7.5 10*3/uL (ref 4.0–10.5)

## 2018-06-12 LAB — POCT I-STAT 3, ART BLOOD GAS (G3+)
Acid-base deficit: 7 mmol/L — ABNORMAL HIGH (ref 0.0–2.0)
BICARBONATE: 18.5 mmol/L — AB (ref 20.0–28.0)
O2 SAT: 99 %
PCO2 ART: 37.3 mmHg (ref 32.0–48.0)
PO2 ART: 170 mmHg — AB (ref 83.0–108.0)
Patient temperature: 98
TCO2: 20 mmol/L — AB (ref 22–32)
pH, Arterial: 7.303 — ABNORMAL LOW (ref 7.350–7.450)

## 2018-06-12 LAB — GLUCOSE, CAPILLARY
GLUCOSE-CAPILLARY: 143 mg/dL — AB (ref 70–99)
GLUCOSE-CAPILLARY: 149 mg/dL — AB (ref 70–99)
GLUCOSE-CAPILLARY: 162 mg/dL — AB (ref 70–99)
GLUCOSE-CAPILLARY: 191 mg/dL — AB (ref 70–99)
Glucose-Capillary: 254 mg/dL — ABNORMAL HIGH (ref 70–99)
Glucose-Capillary: 218 mg/dL — ABNORMAL HIGH (ref 70–99)
Glucose-Capillary: 128 mg/dL — ABNORMAL HIGH (ref 70–99)

## 2018-06-12 LAB — MAGNESIUM
Magnesium: 1.7 mg/dL (ref 1.7–2.4)
Magnesium: 2 mg/dL (ref 1.7–2.4)

## 2018-06-12 MED ORDER — ORAL CARE MOUTH RINSE
15.0000 mL | Freq: Two times a day (BID) | OROMUCOSAL | Status: DC
  Administered 2018-06-13 – 2018-06-18 (×10): 15 mL via OROMUCOSAL

## 2018-06-12 MED ORDER — CLONIDINE HCL 0.1 MG/24HR TD PTWK
0.1000 mg | MEDICATED_PATCH | TRANSDERMAL | Status: DC
  Administered 2018-06-12 – 2018-06-19 (×2): 0.1 mg via TRANSDERMAL
  Filled 2018-06-12 (×2): qty 1

## 2018-06-12 MED ORDER — FENTANYL 2500MCG IN NS 250ML (10MCG/ML) PREMIX INFUSION: 0.0000 ug/h | INTRAVENOUS | Status: DC

## 2018-06-12 MED ORDER — FENTANYL CITRATE (PF) 100 MCG/2ML IJ SOLN
INTRAMUSCULAR | Status: AC
  Administered 2018-06-12: 100 ug
  Filled 2018-06-12: qty 2

## 2018-06-12 MED ORDER — MAGNESIUM SULFATE 2 GM/50ML IV SOLN: 2.0000 g | Freq: Once | INTRAVENOUS | Status: DC

## 2018-06-12 MED ORDER — MAGNESIUM SULFATE 2 GM/50ML IV SOLN
2.0000 g | Freq: Once | INTRAVENOUS | Status: AC
  Administered 2018-06-12: 2 g via INTRAVENOUS
  Filled 2018-06-12: qty 50

## 2018-06-12 MED ORDER — FENTANYL 2500MCG IN NS 250ML (10MCG/ML) PREMIX INFUSION
INTRAVENOUS | Status: AC
  Filled 2018-06-12: qty 250

## 2018-06-12 MED ORDER — "THROMBI-PAD 3""X3"" EX PADS"
1.0000 | MEDICATED_PAD | Freq: Once | CUTANEOUS | Status: AC
  Administered 2018-06-12: 1 via TOPICAL
  Filled 2018-06-12: qty 1

## 2018-06-12 MED ORDER — LORAZEPAM 2 MG/ML IJ SOLN
INTRAMUSCULAR | Status: AC
  Administered 2018-06-12: 2 mg
  Filled 2018-06-12: qty 1

## 2018-06-12 NOTE — Progress Notes (Signed)
[facility] ADULT ICU REPLACEMENT PROTOCOL FOR AM LAB REPLACEMENT ONLY  The patient does apply for the [facility] Adult ICU Electrolyte Replacment Protocol based on the criteria listed below:   1. Is GFR >/= 40 ml/min? Yes.    Patient's GFR today is 49 2. Is urine output >/= 0.5 ml/kg/hr for the last 6 hours? Yes.   Patient's UOP is 1 ml/kg/hr 3. Is BUN < 60 mg/dL? Yes.    Patient's BUN today is 28 4. Abnormal electrolyte(s): mag-1.7 5. Ordered repletion with: per protocol 6. If a panic level lab has been reported, has the CCM MD in charge been notified? Yes.  .   Physician:  Dr. Burman Freestone, Dixon Boos 06/12/2018 6:15 AM

## 2018-06-12 NOTE — Procedures (Signed)
Extubation Procedure Note  Patient Details:   Name: Ian Ramos DOB: 08/12/1946 MRN: [id number]   Airway Documentation:    Vent end date: (not recorded) Vent end time: (not recorded)   Evaluation  O2 sats: stable throughout Complications: No apparent complications Patient did tolerate procedure well. Bilateral Breath Sounds: Diminished   Yes  4l/min [name] placed Incentive spirometer  [name] 06/12/2018, 9:30 AM

## 2018-06-12 NOTE — Progress Notes (Signed)
RT note-Placed on wean and cuff leak checked, patient does have cuff leak this morning and follows commands.

## 2018-06-12 NOTE — Progress Notes (Signed)
PT Cancellation Note  Patient Details Name: Ian Ramos MRN: [id number] DOB: 23-Mar-1946   Cancelled Treatment:    Reason Eval/Treat Not Completed: Medical issues which prohibited therapy(pt remains on vent and will check on at later date for medical stability)   Ellicia Alix B Lanai Conlee 06/12/2018, 6:52 AM  Delaney Meigs, PT Acute Rehabilitation Services Pager: [phone] Office: [phone]

## 2018-06-12 NOTE — Progress Notes (Signed)
Results for DENSON, JULIAN (MRN [id number]) as of 06/12/2018 12:48  Ref. Range 06/11/2018 20:11 06/11/2018 23:53 06/12/2018 04:29 06/12/2018 08:04  Glucose-Capillary Latest Ref Range: 70 - 99 mg/dL 366 (H) 294 (H) 765 (H) 218 (H)  Noted that blood sugars have been greater than 200 mg/dl. Recommend adding Novolog 3 units every 4 hours for tube feed coverage if blood sugars continue to be greater than 200 mg/dl.    Smith Mince RN BSN CDE Diabetes Coordinator Pager: [phone]  8am-5pm

## 2018-06-12 NOTE — Progress Notes (Signed)
Ian Ramos  UJW:[id number] DOB: 09/03/1946 DOA: 05/28/2018 PCP: Mliss Sax, MD    LOS: 8 days   Reason for Consult / Chief Complaint:  Respiratory arrest   Consulting MD and date:  gruntz   HPI/Summary of hospital stay:  72 year old male patient history of stage III chronic kidney disease, cervical spine stenosis status post recent cervical laminectomy August 2019.  Presented to the emergency room on 8/28 with chief complaint of chest pain, shoulder pain and substernal discomfort..  Was found to have some esophageal thickening, possibly some esophageal dysmotility, etiology was never clearly identified but resolved.  He was found to have new dilated cardiomyopathy with EF 35% and diffuse hypokinesis, and bundle branch block, but it was unclear as to whether or not if this was the primary cause of his discomfort.  The plan was to proceed with iliac ischemia evaluation.  He underwent cardiac catheterization on 9/5: Showed moderate obstructive coronary artery disease with 75% occluded proximal LAD 75% OM 2 occluded RV marginal branch with left to right collaterals.  This was not considered critical CAD and medical management was recommended. On 9/6 the patient developed acute mental status change.  On initial eval he was confused, preservation, and had right upper extremity drift and right visual field cut.  CT brain history left temporal lobe infarct felt possibly embolic in etiology.  Carotid ultrasound showed likely left carotid occlusion with high-grade right carotid stenosis.  Vascular surgery was consulted.  Plavix was recommended and delaying carotid endarterectomy for further recovery was felt best course of action. 9/7: Continues to demonstrate aphasia.  Also with ongoing left upper extremity weakness.  MRI showed right MCA/PCA watershed and right parietal punctate infarcts felt more consistent with high-grade ICA stenosis on the left. 9/8: Patient vomited twice during the  day while family was assisting with feeding 9/9: Found unresponsive in chair.  Blood pressure 48/32 heart rate 58 area not clear that he lost pulses.  Pulse oximetry not recorded.  He was apneic, Code blue was called patient was intubated and transferred to the intensive care.  Subjective:  No events overnight, no new complaints Weaning well  Objective   Blood pressure 110/80, pulse 64, temperature 97.7 F (36.5 C), temperature source Oral, resp. rate 18, height 5' 7.5" (1.715 m), weight 73.2 kg, SpO2 100 %. CVP:  [1 mmHg] 1 mmHg  Vent Mode: PSV;CPAP FiO2 (%):  [40 %] 40 % Set Rate:  [15 bmp] 15 bmp Vt Set:  [540 mL] 540 mL PEEP:  [5 cmH20] 5 cmH20 Pressure Support:  [5 cmH20] 5 cmH20   Intake/Output Summary (Last 24 hours) at 06/12/2018 0948 Last data filed at 06/12/2018 0900 Gross per 24 hour  Intake 3616.62 ml  Output 1515 ml  Net 2101.62 ml   Filed Weights   06/06/18 0432 06/06/18 0605 06/12/18 0500  Weight: 73.4 kg 74.7 kg 73.2 kg   Examination:  General appearance: Confused, acutely ill appearing, NAD Eyes: PERRL, EOM-I  HENT: [place]/AT, MMM Neck: Trachea midline; soft C-collar is in place, -visible JVD Lungs: CTA bilaterally, strong cough post extubation CV: RRR, Nl S1/S2 and -M/R/G Abdomen: Soft, NT, ND and +BS Extremities: No peripheral edema or radial and DP pulses present bilaterally  Skin: Intact Psych: Confused post extubation, but baseline since CVA per family Neuro: Awake, following commands but confused  Consults: date of consult/date signed off & final recs:  GI 8/29: Found asymptomatic esophagitis.  Also evidence of esophageal dysmotility on esophagram.  GI signed off on 8/30 with final recommendations for daily PPI x4 weeks and routine cancer screening Cardiology consulted 9/3 for chest pain.  Underwent left heart cath which showed diffuse coronary artery disease but noncritical recommended medical therapy, because he had bradycardia and CKD ARB was held he  was recommended to continue hydralazine, nitrates and diuretics 9/7: Neurology consulted for stroke Vascular surgery consulted 9/6: For bilateral carotid artery disease: Started on Plavix.  Recommending gentle hydration.  Considering the patient for carotid stenting in the next 2 weeks  Procedures:  Left heart cath 9/5 showing moderate noncritical coronary artery disease.  75% occluded proximal LAD 75% occluded OM.  Medical management was recommended   Significant Diagnostic Tests: Echocardiogram 8/29:EF 35 to 40%.  Diffuse hypokinesis.  Doppler parameters consistent with grade 1 diastolic dysfunction. 9/6: MRI brainExtensive motion artifact.  Minimal flow related signal in the left cervical ICA and absent flow of the left ICA representing occlusion or proximal high-grade stenosis.  There is a large anterior communicating artery providing collateral circulation to the left MCA and left PCA. 9/6: CT brain: Subtle hypodensity in the anterior mid left temporal lobe 9/4: Nuclear stress test EF 39% findings were consistent with ischemia felt to be intermittent risk 8/30: Esophagram showing moderate esophageal dysmotility  Micro Data: Respiratory culture 9/9>>>  Antimicrobials:  Vancomycin 9/9 Zosyn 9/9  Resolved Hospital Problem list    Assessment & Plan:   Assessment/Plan: Acute hypoxemic respiratory failure requiring intubation and mechanical ventilation.  Unclear at this time what the etiology of his respiratory arrest was.  Additionally low blood pressures leading to poor cerebral perfusion due to his bilateral carotid stenosis could be a factor.   - Extubate - Monitor closely for airway protection given C-collar and neuro status - IS and flutter valve - OOB to chair and PT evaluation - SLP  Possible aspiration pneumonia, respiratory cultures with gram-negative rods and gram-positive cocci.  No evidence of infection, will not start abx.  Dilated cardiomyopathy with a reduced  ejection fraction, acute systolic heart failure Acute bilateral CVA, bilateral carotid stenosis, altered mental status could be related to hypotension and the progressive increase in heart failure medications.  Neurology recommending systolic blood pressures between 130-150 which complicates management of his heart failure. - D/C levophed - D/C precedex (drops his BP) - Tele monitoring  Vomiting, history of esophageal dysmotility Cervical myelopathy status post laminectomy Anemia of chronic disease - D/C TF - SLP - Keep NGT  Urinary retention. - Keep foley in place at this time  Bradycardia LBBB - Tele monitoring   HTN: - Start catapres patch at 0.1  Disposition / Summary of Today's Plan 06/12/18   Extubate, SLP, start HTN treatment, PT evaluation, OOB  Best Practice / Goals of Care / Disposition.   DVT prophylaxis: LMWH GI prophylaxis: PPI VT  Diet: NPO, start tubefeeds Mobility: BR Code Status: full Family Communication: full   Labs    CBC: Recent Labs  Lab 06/07/18 0226 06/09/18 0401 06/10/18 0327 06/11/18 0302 06/12/18 0431  WBC 9.1 11.5* 18.5* 11.1* 7.5  NEUTROABS  --   --  16.1* 8.9*  --   HGB 9.5* 11.5* 11.0* 10.0* 9.1*  HCT 30.0* 34.5* 33.7* 30.5* 28.3*  MCV 92.0 88.7 90.8 91.0 91.0  PLT 176 191 195 146* 128*   Basic Metabolic Panel: Recent Labs  Lab 06/08/18 0433 06/09/18 0401 06/10/18 0327 06/11/18 0302 06/11/18 1813 06/12/18 0431  NA 136 136 134* 134*  --  136  K  4.4 4.4 3.9 3.8  --  4.1  CL 104 103 101 106  --  109  CO2 22 21* 20* 17*  --  20*  GLUCOSE 111* 93 157* 115*  --  257*  BUN 23 26* 36* 29*  --  28*  CREATININE 1.46* 1.54* 1.98* 1.56*  --  1.38*  CALCIUM 8.7* 9.2 8.2* 8.1*  --  8.0*  MG  --   --   --   --  1.6* 1.7  PHOS  --   --   --   --  3.0 3.2   GFR: Estimated Creatinine Clearance: 45.4 mL/min (A) (by C-G formula based on SCr of 1.38 mg/dL (H)). Recent Labs  Lab 06/09/18 0401 06/10/18 0327 06/11/18 0302  06/12/18 0431  WBC 11.5* 18.5* 11.1* 7.5   Liver Function Tests: No results for input(s): AST, ALT, ALKPHOS, BILITOT, PROT, ALBUMIN in the last 168 hours. No results for input(s): LIPASE, AMYLASE in the last 168 hours. No results for input(s): AMMONIA in the last 168 hours.   ABG    Component Value Date/Time   PHART 7.303 (L) 06/12/2018 0402   PCO2ART 37.3 06/12/2018 0402   PO2ART 170.0 (H) 06/12/2018 0402   HCO3 18.5 (L) 06/12/2018 0402   TCO2 20 (L) 06/12/2018 0402   ACIDBASEDEF 7.0 (H) 06/12/2018 0402   O2SAT 99.0 06/12/2018 0402    Coagulation Profile: No results for input(s): INR, PROTIME in the last 168 hours.   Cardiac Enzymes: No results for input(s): CKTOTAL, CKMB, CKMBINDEX, TROPONINI in the last 168 hours.   HbA1C: Hgb A1c MFr Bld  Date/Time Value Ref Range Status  06/06/2018 08:58 AM 6.1 (H) 4.8 - 5.6 % Final    Comment:    (NOTE) Pre diabetes:          5.7%-6.4% Diabetes:              >6.4% Glycemic control for   <7.0% adults with diabetes    CBG: Recent Labs  Lab 06/11/18 1604 06/11/18 2011 06/11/18 2353 06/12/18 0429 06/12/18 0804  GLUCAP 92 126* 191* 254* 218*   Family updated bedside.  The patient is critically ill with multiple organ systems failure and requires high complexity decision making for assessment and support, frequent evaluation and titration of therapies, application of advanced monitoring technologies and extensive interpretation of multiple databases.   Critical Care Time devoted to patient care services described in this note is  32  Minutes. This time reflects time of care of this signee Dr Koren Bound. This critical care time does not reflect procedure time, or teaching time or supervisory time of PA/NP/Med student/Med Resident etc but could involve care discussion time.  Alyson Reedy, M.D. [facility] Pulmonary/Critical Care Medicine. Pager: [phone]. After hours pager: [phone].

## 2018-06-12 NOTE — Progress Notes (Signed)
 Progress Note  Patient Name: Ian Ramos Date of Encounter: 06/12/2018  Primary Cardiologist:   No primary care provider on file.  Subjective   On 06/09/2018 he was found unresponsive in his chair on the medical floor with a blood pressure of 40s over 30s and a heart rate in the 50s.  A code was called patient received atropine and was intubated for respiratory support.  He became oliguric overnight. Worsening kidney function. On levophed 1 mcg/kg/min. CVP 1. Coox 67%. Improving vent parameters, failed extubation yesterday, plan to extubation today.   Inpatient Medications    Scheduled Meds: .  stroke: mapping our early stages of recovery book   Does not apply Once  . aspirin  162 mg Per Tube Daily  . atorvastatin  80 mg Per Tube q1800  . chlorhexidine gluconate (MEDLINE KIT)  15 mL Mouth Rinse BID  . Chlorhexidine Gluconate Cloth  6 each Topical Daily  . cloNIDine  0.1 mg Transdermal Weekly  . clopidogrel  75 mg Per Tube Daily  . enoxaparin (LOVENOX) injection  40 mg Subcutaneous Q24H  . insulin aspart  0-15 Units Subcutaneous Q4H  . iopamidol  100 mL Intravenous Once  . mouth rinse  15 mL Mouth Rinse BID  . sodium chloride flush  10-40 mL Intracatheter Q12H  . sodium chloride flush  3 mL Intravenous Q12H   Continuous Infusions: . sodium chloride 10 mL/hr at 06/09/18 1610  . sodium chloride 75 mL/hr at 06/12/18 1000  . famotidine (PEPCID) IV Stopped (06/11/18 1641)  . feeding supplement (VITAL AF 1.2 CAL) 60 mL/hr at 06/12/18 1000  . fentaNYL    . norepinephrine (LEVOPHED) Adult infusion Stopped (06/12/18 0046)   PRN Meds: sodium chloride, acetaminophen, fentaNYL (SUBLIMAZE) injection, sodium chloride flush, sodium chloride flush   Vital Signs    Vitals:   06/12/18 0930 06/12/18 0945 06/12/18 1000 06/12/18 1015  BP: 110/80  127/67   Pulse: 64 69 64 64  Resp: 18 (!) 23 (!) 21 16  Temp:      TempSrc:      SpO2: 100% 100% 100% 100%  Weight:      Height:         Intake/Output Summary (Last 24 hours) at 06/12/2018 1040 Last data filed at 06/12/2018 1000 Gross per 24 hour  Intake 3657.88 ml  Output 1465 ml  Net 2192.88 ml   Filed Weights   06/06/18 0432 06/06/18 0605 06/12/18 0500  Weight: 73.4 kg 74.7 kg 73.2 kg   Telemetry    NSR - Personally Reviewed  ECG    NA - Personally Reviewed  Physical Exam   GEN: intubated, sedated Neck: No  JVD Cardiac: RRR, 2/6 systolic murmurs, rubs, or gallops.  Respiratory: Clear  to auscultation bilaterally. GI: Soft, nontender, non-distended  MS: No  edema; No deformity. Poor peripheral pulses. Neuro:  Nonfocal   Labs    Chemistry Recent Labs  Lab 06/10/18 0327 06/11/18 0302 06/12/18 0431  NA 134* 134* 136  K 3.9 3.8 4.1  CL 101 106 109  CO2 20* 17* 20*  GLUCOSE 157* 115* 257*  BUN 36* 29* 28*  CREATININE 1.98* 1.56* 1.38*  CALCIUM 8.2* 8.1* 8.0*  GFRNONAA 32* 42* 49*  GFRAA 37* 49* 57*  ANIONGAP 13 11 7    Hematology Recent Labs  Lab 06/10/18 0327 06/11/18 0302 06/12/18 0431  WBC 18.5* 11.1* 7.5  RBC 3.71* 3.35* 3.11*  HGB 11.0* 10.0* 9.1*  HCT 33.7* 30.5* 28.3*  MCV   90.8 91.0 91.0  MCH 29.6 29.9 29.3  MCHC 32.6 32.8 32.2  RDW 12.1 12.0 12.0  PLT 195 146* 128*   Cardiac EnzymesNo results for input(s): TROPONINI in the last 168 hours. No results for input(s): TROPIPOC in the last 168 hours.   BNPNo results for input(s): BNP, PROBNP in the last 168 hours.   DDimer No results for input(s): DDIMER in the last 168 hours.   Radiology    Dg Chest Port 1 View  Result Date: 06/12/2018 CLINICAL DATA:  Respiratory failure EXAM: PORTABLE CHEST 1 VIEW COMPARISON:  06/09/2018 FINDINGS: Endotracheal tube and left subclavian central line are again noted and stable. Feeding catheter has been placed and courses towards the stomach. Previously seen nasogastric catheter has been removed. Postsurgical changes in the cervical spine are noted. Cardiac shadow is stable. Lungs are well  aerated bilaterally without focal infiltrate or sizable effusion. IMPRESSION: Tubes and lines as described above. No acute abnormality noted. Electronically Signed   By: Inez Catalina M.D.   On: 06/12/2018 09:59   Dg Abd Portable 1v  Result Date: 06/11/2018 CLINICAL DATA:  Feeding tube placement EXAM: PORTABLE ABDOMEN - 1 VIEW COMPARISON:  06/10/2018 FINDINGS: Feeding tube has been placed with the tip in the distal descending duodenum. Nonobstructive bowel gas pattern. IMPRESSION: Feeding tube in the distal descending duodenum. Electronically Signed   By: Rolm Baptise M.D.   On: 06/11/2018 11:12   Cardiac Studies   Cardiac cath:  Left Anterior Descending  Prox LAD lesion 75% stenosed  Prox LAD lesion is 75% stenosed. The lesion is eccentric.  Second Septal Branch  Left Circumflex  Second Obtuse Marginal Branch  Ost 2nd Mrg to 2nd Mrg lesion 75% stenosed  Ost 2nd Mrg to 2nd Mrg lesion is 75% stenosed.  Right Coronary Artery  Prox RCA to Mid RCA lesion 30% stenosed  Prox RCA to Mid RCA lesion is 30% stenosed.  Acute Marginal Branch  Collaterals  Acute Mrg filled by collaterals from 2nd Sept.    Acute Mrg lesion 100% stenosed  Acute Mrg lesion is 100% stenosed. The lesion is chronically occluded with left-to-right collateral flow.     Patient Profile     72 y.o. male with a history of chronic kidney disease stage III and cervical disc disease who underwent recent cervical laminectomy on 05/23/2018 without any complications. On 05/28/2018 he awakened at 4 AM with severe substernal chest pain and tried to sit up the pain became more severe. There were no associated symptoms of shortness of breath, diaphoresis or nausea. He called EMS and was given aspirin and chest pain resolved but he continued to have left shoulder pain although he says that he has intermittent shoulder pain chronically  Assessment & Plan    Hypotension, coox 67, this most most probably related to respiratory  failure, possible aspiration pneumonia, not CHF, the patient on minimal dose of Levophed 1 mcg/kg/min, should be discontinued, CVP is 1. Continue to hold diuretics.  CAD as above with plans for optimal medical management with PCI if clear anginal refractory symptoms.  If Crea improves we will discuss PCI/LAD with the interventional team.  Acute on chronic kidney insufficiency: Crea 1.7>1.4>1.5-->1.98->1.56->1.38, with oliguria, most probably sec to hypotension on 9/9, imdur was held,CVP 1, old diuretics.  Carotid stenosis: Dr Glory Rosebush asked Dr Oneida Alar to evaluate him for possible carotid stenting.  If this is possible, neurology would like this done within the 2-week window. Vascular team would arther wait given respiratory failure and  intubation. On Plavix.  For questions or updates, please contact [place] Please consult [email or web address] for contact info under Cardiology/STEMI.   Signed, Ena Dawley, MD  06/12/2018, 10:40 AM

## 2018-06-13 DIAGNOSIS — I10 Essential (primary) hypertension: Secondary | ICD-10-CM

## 2018-06-13 DIAGNOSIS — G9340 Encephalopathy, unspecified: Secondary | ICD-10-CM

## 2018-06-13 LAB — CBC
HCT: 26.2 % — ABNORMAL LOW (ref 39.0–52.0)
HEMOGLOBIN: 8.6 g/dL — AB (ref 13.0–17.0)
MCH: 29.4 pg (ref 26.0–34.0)
MCHC: 32.8 g/dL (ref 30.0–36.0)
MCV: 89.4 fL (ref 78.0–100.0)
Platelets: 160 10*3/uL (ref 150–400)
RBC: 2.93 MIL/uL — ABNORMAL LOW (ref 4.22–5.81)
RDW: 12.1 % (ref 11.5–15.5)
WBC: 8 10*3/uL (ref 4.0–10.5)

## 2018-06-13 LAB — GLUCOSE, CAPILLARY
GLUCOSE-CAPILLARY: 114 mg/dL — AB (ref 70–99)
Glucose-Capillary: 160 mg/dL — ABNORMAL HIGH (ref 70–99)
Glucose-Capillary: 118 mg/dL — ABNORMAL HIGH (ref 70–99)
Glucose-Capillary: 111 mg/dL — ABNORMAL HIGH (ref 70–99)
Glucose-Capillary: 120 mg/dL — ABNORMAL HIGH (ref 70–99)

## 2018-06-13 LAB — MAGNESIUM: Magnesium: 2.1 mg/dL (ref 1.7–2.4)

## 2018-06-13 LAB — BASIC METABOLIC PANEL
Anion gap: 6 (ref 5–15)
BUN: 34 mg/dL — ABNORMAL HIGH (ref 8–23)
CALCIUM: 7.6 mg/dL — AB (ref 8.9–10.3)
CHLORIDE: 112 mmol/L — AB (ref 98–111)
CO2: 21 mmol/L — AB (ref 22–32)
CREATININE: 1.25 mg/dL — AB (ref 0.61–1.24)
GFR calc Af Amer: >60 mL/min (ref >60)
GFR calc non Af Amer: 55 mL/min — ABNORMAL LOW (ref >60)
GLUCOSE: 153 mg/dL — AB (ref 70–99)
Potassium: 3.7 mmol/L (ref 3.5–5.1)
Sodium: 139 mmol/L (ref 135–145)

## 2018-06-13 LAB — PHOSPHORUS: Phosphorus: 1.2 mg/dL — ABNORMAL LOW (ref 2.5–4.6)

## 2018-06-13 MED ORDER — RESOURCE THICKENUP CLEAR PO POWD
ORAL | Status: DC | PRN
  Filled 2018-06-13 (×3): qty 125

## 2018-06-13 MED ORDER — POTASSIUM PHOSPHATES 15 MMOLE/5ML IV SOLN
40.0000 meq | Freq: Once | INTRAVENOUS | Status: AC
  Administered 2018-06-13: 40 meq via INTRAVENOUS
  Filled 2018-06-13: qty 9.09

## 2018-06-13 MED ORDER — VITAL AF 1.2 CAL PO LIQD
1500.0000 mL | ORAL | Status: DC
  Administered 2018-06-13: 1500 mL
  Filled 2018-06-13 (×5): qty 1500

## 2018-06-13 NOTE — Plan of Care (Signed)
  Problem: Clinical Measurements: Goal: Will remain free from infection Outcome: Progressing   Problem: Health Behavior/Discharge Planning: Goal: Ability to manage health-related needs will improve Outcome: Progressing  PT needs constant reminders to stay in bed and not pull on lines/tubes  Problem: Coping: Goal: Level of anxiety will decrease Outcome: Progressing   Problem: Activity: Goal: Ability to tolerate increased activity will improve Outcome: Progressing   Problem: Respiratory: Goal: Ability to maintain a clear airway and adequate ventilation will improve Outcome: Progressing

## 2018-06-13 NOTE — Progress Notes (Signed)
Nutrition Follow-up  DOCUMENTATION CODES:   Not applicable  INTERVENTION:   Vital AF 1.2 @ 60 ml/hr (1440 ml/day) via Cortrak  Provides: 1728 kcal, 108 grams protein, and 1167 ml free water.   Encourage PO intake on dysphagia diet  NUTRITION DIAGNOSIS:   Inadequate oral intake related to dysphagia as evidenced by meal completion < 50%. Ongoing.   GOAL:   Patient will meet greater than or equal to 90% of their needs Met.   MONITOR:   PO intake, TF tolerance  ASSESSMENT:   Pt with PMH of stage III CKD, s/p cervial laminectomy 8/19, esophageal dysmotility that resolved admitted with new cardiomyopathy with EF 35% s/p cath 9/5, 9/7 MRI shows R MCA/PCA watershed infarcts, 9/8 pt vomited twice while eating, 9/9 found unresponsive and pt tx to ICU and intubated.   9/11 Cortrak placed 9/12 extubated 9/13 passed FEES, now on dysphagia 2 diet with honey thickened liquids  Spoke with SLP and RN. Will continue feedings over weekend to monitor intake of diet.  Per pt he is unsure of any weight loss. Felt he ate fine with no problems swallowing PTA. Pt with flat affect during conversation.   Labs reviewed: PO4: 1.2 (L) Phosphorus dropped yesterday to 1.5 now lower to 1.2  TF via Cortrak: Vital AF 1.2 @ 60 ml/hr (1440 ml/day) Provides: 1728 kcal, 108 grams protein, and 1167 ml free water.   Diet Order:   Diet Order            DIET DYS 2 Room service appropriate? Yes; Fluid consistency: Honey Thick  Diet effective now              EDUCATION NEEDS:   No education needs have been identified at this time  Skin:  Skin Assessment: Reviewed RN Assessment  Last BM:  9/8 smear  Height:   Ht Readings from Last 1 Encounters:  05/28/18 5' 7.5" (1.715 m)    Weight:   Wt Readings from Last 1 Encounters:  06/13/18 75.2 kg    Ideal Body Weight:  68.6 kg  BMI:  Body mass index is 25.58 kg/m.  Estimated Nutritional Needs:   Kcal:  1750-2000  Protein:  90-110  grams  Fluid:  > 1.7 L/day  Maylon Peppers RD, LDN, CNSC [phone] Pager [phone] After Hours Pager

## 2018-06-13 NOTE — Progress Notes (Signed)
Ian Ramos  ZOX:[id number] DOB: [birth year] DOA: 05/28/2018 PCP: Mliss Sax, MD    LOS: 9 days   Reason for Consult / Chief Complaint:  Respiratory arrest   Consulting MD and date:  gruntz   HPI/Summary of hospital stay:  72 year old male patient history of stage III chronic kidney disease, cervical spine stenosis status post recent cervical laminectomy August 2019.  Presented to the emergency room on 8/28 with chief complaint of chest pain, shoulder pain and substernal discomfort..  Was found to have some esophageal thickening, possibly some esophageal dysmotility, etiology was never clearly identified but resolved.  He was found to have new dilated cardiomyopathy with EF 35% and diffuse hypokinesis, and bundle branch block, but it was unclear as to whether or not if this was the primary cause of his discomfort.  The plan was to proceed with iliac ischemia evaluation.  He underwent cardiac catheterization on 9/5: Showed moderate obstructive coronary artery disease with 75% occluded proximal LAD 75% OM 2 occluded RV marginal branch with left to right collaterals.  This was not considered critical CAD and medical management was recommended. On 9/6 the patient developed acute mental status change.  On initial eval he was confused, preservation, and had right upper extremity drift and right visual field cut.  CT brain history left temporal lobe infarct felt possibly embolic in etiology.  Carotid ultrasound showed likely left carotid occlusion with high-grade right carotid stenosis.  Vascular surgery was consulted.  Plavix was recommended and delaying carotid endarterectomy for further recovery was felt best course of action. 9/7: Continues to demonstrate aphasia.  Also with ongoing left upper extremity weakness.  MRI showed right MCA/PCA watershed and right parietal punctate infarcts felt more consistent with high-grade ICA stenosis on the left. 9/8: Patient vomited twice during the  day while family was assisting with feeding 9/9: Found unresponsive in chair.  Blood pressure 48/32 heart rate 58 area not clear that he lost pulses.  Pulse oximetry not recorded.  He was apneic, Code blue was called patient was intubated and transferred to the intensive care.  Subjective:  Extubated and well tolerated at this point Confused but no new complaints overnight  Objective   Blood pressure 118/63, pulse 80, temperature 98.2 F (36.8 C), temperature source Oral, resp. rate (!) 24, height 5' 7.5" (1.715 m), weight 75.2 kg, SpO2 99 %.        Intake/Output Summary (Last 24 hours) at 06/13/2018 1059 Last data filed at 06/13/2018 1000 Gross per 24 hour  Intake 3311.94 ml  Output 1690 ml  Net 1621.94 ml   Filed Weights   06/06/18 0605 06/12/18 0500 06/13/18 0500  Weight: 74.7 kg 73.2 kg 75.2 kg   Examination:  General appearance: Confused, following commands Eyes: PERRL, EOM-I HENT: [place]/AT and MMM Neck: C-collar in place Lungs: CTA bilaterally  CV: RRR, Nl S1/S2 and -M/R/G Abdomen: Soft, NT, ND and +BS Extremities: -edema and -tenderness Skin: Intact Psych: Confused post extubation, but baseline since CVA per family Neuro: Awake, confused but following commands  Consults: date of consult/date signed off & final recs:  GI 8/29: Found asymptomatic esophagitis.  Also evidence of esophageal dysmotility on esophagram.  GI signed off on 8/30 with final recommendations for daily PPI x4 weeks and routine cancer screening Cardiology consulted 9/3 for chest pain.  Underwent left heart cath which showed diffuse coronary artery disease but noncritical recommended medical therapy, because he had bradycardia and CKD ARB was held he was recommended  to continue hydralazine, nitrates and diuretics 9/7: Neurology consulted for stroke Vascular surgery consulted 9/6: For bilateral carotid artery disease: Started on Plavix.  Recommending gentle hydration.  Considering the patient for carotid  stenting in the next 2 weeks  Procedures:  Left heart cath 9/5 showing moderate noncritical coronary artery disease.  75% occluded proximal LAD 75% occluded OM.  Medical management was recommended  Significant Diagnostic Tests: Echocardiogram 8/29:EF 35 to 40%.  Diffuse hypokinesis.  Doppler parameters consistent with grade 1 diastolic dysfunction. 9/6: MRI brainExtensive motion artifact.  Minimal flow related signal in the left cervical ICA and absent flow of the left ICA representing occlusion or proximal high-grade stenosis.  There is a large anterior communicating artery providing collateral circulation to the left MCA and left PCA. 9/6: CT brain: Subtle hypodensity in the anterior mid left temporal lobe 9/4: Nuclear stress test EF 39% findings were consistent with ischemia felt to be intermittent risk 8/30: Esophagram showing moderate esophageal dysmotility 9/12 extubated, confused  Micro Data: Respiratory culture 9/9>>>  Antimicrobials:  Vancomycin 9/9>>>9/12 Zosyn 9/9>>>9/12  I reviewed CXR myself, no acute disease noted  Resolved Hospital Problem list    Assessment & Plan:   Assessment/Plan: Acute hypoxemic respiratory failure requiring intubation and mechanical ventilation.  Unclear at this time what the etiology of his respiratory arrest was.  Additionally low blood pressures leading to poor cerebral perfusion due to his bilateral carotid stenosis could be a factor.   - Monitor closely for airway protection with c-collar in place - IS and flutter valve - Safety sitter - SLP when able to follow commands  Possible aspiration pneumonia, respiratory cultures with gram-negative rods and gram-positive cocci.  No evidence of infection, Monitor off abx  Dilated cardiomyopathy with a reduced ejection fraction, acute systolic heart failure Acute bilateral CVA, bilateral carotid stenosis, altered mental status could be related to hypotension and the progressive increase in heart  failure medications.  Neurology recommending systolic blood pressures between 130-150 which complicates management of his heart failure. - D/C pressors - D/C sedative  - Tele monitoring  Vomiting, history of esophageal dysmotility Cervical myelopathy status post laminectomy Anemia of chronic disease - TF - SLP when able to follow commands - Keep NGT  Urinary retention. - Keep foley in place for now  Bradycardia LBBB - Continue tele monitoring   HTN: - Continue catapres patch 0.1 for now  Disposition / Summary of Today's Plan 06/13/18   Transfer to tele and to [facility] service with PCCM off 9/14  Discussed with TRH-MD and PCCM-NP.  Best Practice / Goals of Care / Disposition.   DVT prophylaxis: LMWH GI prophylaxis: PPI VT  Diet: NPO, start tubefeeds Mobility: BR Code Status: full Family Communication: full   Labs    CBC: Recent Labs  Lab 06/09/18 0401 06/10/18 0327 06/11/18 0302 06/12/18 0431 06/13/18 0435  WBC 11.5* 18.5* 11.1* 7.5 8.0  NEUTROABS  --  16.1* 8.9*  --   --   HGB 11.5* 11.0* 10.0* 9.1* 8.6*  HCT 34.5* 33.7* 30.5* 28.3* 26.2*  MCV 88.7 90.8 91.0 91.0 89.4  PLT 191 195 146* 128* 160   Basic Metabolic Panel: Recent Labs  Lab 06/09/18 0401 06/10/18 0327 06/11/18 0302 06/11/18 1813 06/12/18 0431 06/12/18 1648 06/13/18 0435  NA 136 134* 134*  --  136  --  139  K 4.4 3.9 3.8  --  4.1  --  3.7  CL 103 101 106  --  109  --  112*  CO2  21* 20* 17*  --  20*  --  21*  GLUCOSE 93 157* 115*  --  257*  --  153*  BUN 26* 36* 29*  --  28*  --  34*  CREATININE 1.54* 1.98* 1.56*  --  1.38*  --  1.25*  CALCIUM 9.2 8.2* 8.1*  --  8.0*  --  7.6*  MG  --   --   --  1.6* 1.7 2.0 2.1  PHOS  --   --   --  3.0 3.2 1.5* 1.2*   GFR: Estimated Creatinine Clearance: 50.1 mL/min (A) (by C-G formula based on SCr of 1.25 mg/dL (H)). Recent Labs  Lab 06/10/18 0327 06/11/18 0302 06/12/18 0431 06/13/18 0435  WBC 18.5* 11.1* 7.5 8.0   Liver Function Tests: No  results for input(s): AST, ALT, ALKPHOS, BILITOT, PROT, ALBUMIN in the last 168 hours. No results for input(s): LIPASE, AMYLASE in the last 168 hours. No results for input(s): AMMONIA in the last 168 hours.   ABG    Component Value Date/Time   PHART 7.303 (L) 06/12/2018 0402   PCO2ART 37.3 06/12/2018 0402   PO2ART 170.0 (H) 06/12/2018 0402   HCO3 18.5 (L) 06/12/2018 0402   TCO2 20 (L) 06/12/2018 0402   ACIDBASEDEF 7.0 (H) 06/12/2018 0402   O2SAT 99.0 06/12/2018 0402    Coagulation Profile: No results for input(s): INR, PROTIME in the last 168 hours.   Cardiac Enzymes: No results for input(s): CKTOTAL, CKMB, CKMBINDEX, TROPONINI in the last 168 hours.   HbA1C: Hgb A1c MFr Bld  Date/Time Value Ref Range Status  06/06/2018 08:58 AM 6.1 (H) 4.8 - 5.6 % Final    Comment:    (NOTE) Pre diabetes:          5.7%-6.4% Diabetes:              >6.4% Glycemic control for   <7.0% adults with diabetes    CBG: Recent Labs  Lab 06/12/18 1632 06/12/18 2013 06/12/18 2345 06/13/18 0401 06/13/18 0745  GLUCAP 162* 143* 149* 160* 118*   Alyson Reedy, M.D. [facility] Pulmonary/Critical Care Medicine. Pager: [phone]. After hours pager: [phone].

## 2018-06-13 NOTE — Progress Notes (Signed)
Physical Therapy Re-evaluation/Treatment Patient Details Name: Ian Ramos MRN: [id number] DOB: 1946-08-10 Today's Date: 06/13/2018    History of Present Illness Pt is a 72 y/o male admitted secondary to intractable chest and L shoulder pain. Neurosurgery feels this is radicular irritation of his left-sided C5 nerve root and started pt on steroids and neurontin. Pt with recent cervical fusion (05/23/18). Imaging negative for PE and showed probable esophagitis. Pt underwent cardiac cath on 9/5. In the AM of 9/6 pt with acute change of mental status and code stroke called. Pt found to have left temporal lobe infarct and right MCA/PCA watershed and right parietal punctate infarcts. Pt also find to have significant carotid stenosis.   Pt coded 2023-06-24 and was intubated.  Extubated 06/12/18.  PMH includes gout and recent Cervical fusion.     PT Comments    Pt admitted with above diagnosis. Pt currently with functional limitations due to balance and endurance deficits. Pt goals still appropriate as they were revised last visit.  Pt was able to pivot to chair with +2 mod assist.  Feel that pt is still a good rehab candidate.  Will follow.  Pt will benefit from skilled PT to increase their independence and safety with mobility to allow discharge to the venue listed below.    Follow Up Recommendations  CIR;Supervision for mobility/OOB     Equipment Recommendations  None recommended by PT    Recommendations for Other Services OT consult     Precautions / Restrictions Precautions Precautions: Fall;Cervical Precaution Booklet Issued: No Precaution Comments: reviewed cervical precautions Required Braces or Orthoses: Cervical Brace Cervical Brace: Soft collar;At all times Restrictions Weight Bearing Restrictions: No    Mobility  Bed Mobility Overal bed mobility: Needs Assistance Bed Mobility: Rolling;Sidelying to Sit Rolling: Max assist Sidelying to sit: Max assist       General bed  mobility comments: Max A for log roll due to poor following of commands.   Transfers Overall transfer level: Needs assistance Equipment used: Rolling walker (2 wheeled) Transfers: Sit to/from [employer] Sit to Stand: Mod assist Stand pivot transfers: Mod assist       General transfer comment: Verbal/tactile cues for hand placement. Assist to bring hips up and for balance. Posterior lean and unsteady on feet for pivot transfer.   Ambulation/Gait                 Stairs             Wheelchair Mobility    Modified Rankin (Stroke Patients Only) Modified Rankin (Stroke Patients Only) Pre-Morbid Rankin Score: Moderately severe disability Modified Rankin: Moderately severe disability     Balance Overall balance assessment: Needs assistance Sitting-balance support: Feet supported;Bilateral upper extremity supported Sitting balance-Leahy Scale: Poor Sitting balance - Comments: Min to mod assist to maintain sitting forward on edge of bed Postural control: Posterior lean Standing balance support: Bilateral upper extremity supported;During functional activity Standing balance-Leahy Scale: Poor Standing balance comment: min to mod assist for static standing with bil UE support                            Cognition Arousal/Alertness: Awake/alert Behavior During Therapy: Flat affect Overall Cognitive Status: Impaired/Different from baseline Area of Impairment: Problem solving;Awareness;Safety/judgement;Orientation;Attention;Memory;Following commands                 Orientation Level: Disoriented to;Place;Time;Situation Current Attention Level: Sustained Memory: Decreased recall of precautions;Decreased short-term memory Following Commands:  Follows one step commands inconsistently Safety/Judgement: Decreased awareness of safety;Decreased awareness of deficits Awareness: Intellectual Problem Solving: Difficulty sequencing;Requires verbal  cues;Slow processing;Decreased initiation;Requires tactile cues General Comments: Pt with confused speech and unable to follow commands consistently.      Exercises General Exercises - Lower Extremity Ankle Circles/Pumps: AROM;Both;10 reps;Supine Long Arc Quad: AROM;Both;10 reps;Seated    General Comments General comments (skin integrity, edema, etc.): VSS      Pertinent Vitals/Pain Pain Assessment: Faces Faces Pain Scale: Hurts a little bit Pain Location: L shoulder  Pain Descriptors / Indicators: Grimacing;Guarding Pain Intervention(s): Limited activity within patient's tolerance;Monitored during session;Repositioned    Home Living                      Prior Function            PT Goals (current goals can now be found in the care plan section) Acute Rehab PT Goals Patient Stated Goal: to go home Progress towards PT goals: Progressing toward goals    Frequency    Min 3X/week      PT Plan Current plan remains appropriate    Co-evaluation              AM-PAC PT "6 Clicks" Daily Activity  Outcome Measure  Difficulty turning over in bed (including adjusting bedclothes, sheets and blankets)?: Unable Difficulty moving from lying on back to sitting on the side of the bed? : Unable Difficulty sitting down on and standing up from a chair with arms (e.g., wheelchair, bedside commode, etc,.)?: Unable Help needed moving to and from a bed to chair (including a wheelchair)?: A Lot Help needed walking in hospital room?: Total Help needed climbing 3-5 steps with a railing? : Total 6 Click Score: 7    End of Session Equipment Utilized During Treatment: Gait belt;Cervical collar Activity Tolerance: Patient limited by fatigue Patient left: with call bell/phone within reach;in chair;with chair alarm set;with restraints reapplied Nurse Communication: Mobility status PT Visit Diagnosis: Pain;Unsteadiness on feet (R26.81);Muscle weakness (generalized)  (M62.81);Difficulty in walking, not elsewhere classified (R26.2);Other symptoms and signs involving the nervous system (R29.898) Pain - Right/Left: Left Pain - part of body: Shoulder     Time: 1610-9604 PT Time Calculation (min) (ACUTE ONLY): 17 min  Charges:                        [employer] Acute Rehabilitation Services Pager:  [phone]  Office:  [phone]     Berline Lopes 06/13/2018, 2:08 PM

## 2018-06-13 NOTE — Progress Notes (Signed)
eLink Physician-Brief Progress Note Patient Name: Ian Ramos DOB: 08/18/1946 MRN: [id number]   Date of Service  06/13/2018  HPI/Events of Note  Low potassium and phos  eICU Interventions  replaced     Intervention Category Intermediate Interventions: Electrolyte abnormality - evaluation and management  Henry Russel, P 06/13/2018, 6:51 AM

## 2018-06-13 NOTE — Procedures (Signed)
Breck Coons [place] administrator Pager [phone] Office [phone]  Objective Swallowing Evaluation: Type of Study: FEES-Fiberoptic Endoscopic Evaluation of Swallow   Patient Details  Name: Ian Ramos MRN: [id number] Date of Birth: [birth year]  Today's Date: 06/13/2018 Time: SLP Start Time (ACUTE ONLY): 1125 -SLP Stop Time (ACUTE ONLY): 1145  SLP Time Calculation (min) (ACUTE ONLY): 20 min   Past Medical History:  Past Medical History:  Diagnosis Date  . Arthritis   . Neuromuscular disorder [facility])    Past Surgical History:  Past Surgical History:  Procedure Laterality Date  . LEFT HEART CATH AND CORONARY ANGIOGRAPHY N/A 06/05/2018   Procedure: LEFT HEART CATH AND CORONARY ANGIOGRAPHY;  Surgeon: Swaziland, Peter M, MD;  Location: [facility] INVASIVE CV LAB;  Service: Cardiovascular;  Laterality: N/A;  . POSTERIOR CERVICAL FUSION/FORAMINOTOMY N/A 05/23/2018   Procedure: Posterior Cervical Fusion with lateral mass fixation - C1 - C6 with laminectomy;  Surgeon: Julio Sicks, MD;  Location: [facility] OR;  Service: Neurosurgery;  Laterality: N/A;   HPI: Ian Ramos is a 72 y.o. male with medical history significant for chronic kidney disease stage III, neuromuscular disorder, cervical stenosis with myelopathy status post cervical laminectomy on 05/23/2018, presented to the emergency department for evaluation of chest pain. Barium esophagram 05/30/18 moderate esophageal dysmotility. MRI is showing acute/early subacute infarcts:  2 in left superiour frontal lobe and 2 in the right posterolateral parietal lobe. CXR No acute abnormality noted. SLE performed 9/8 with recommendation for continued ST. On 9/9 pt found to be unresponsive, not breathign and code blue called. Pt intubated 9/9-9/12   Subjective: The patient was seen with his sister present at the bedside.      Assessment / Plan / Recommendation  CHL IP CLINICAL IMPRESSIONS 06/13/2018  Clinical Impression Pt  exhibited moderate pharyngeal dysphagia with timely initiation of swallow. Arytenoid cartilages edematous and pharyngeal wall appeared narrow with decreased movement during non swallow speech tasks. Left arytenoid cartilage less mobile than right. Initiation of swallow timely and pt immediately coughed following trial teaspoon and cup sip nectar suspicious for entrance into laryngeal vestibule however depth unknown due to white out period during swallow (penetrate or aspirate). He appeared to clear as po's not observed in or below vestibule post swallow. Pt likely unable to comply with compensatory strategy and therefore not attempted. Mild vallecular and pyriform residue present. Recommending pt initiate Dys 2 texture, honey thick liquids, crush large pills, full supervision and continued ST.    SLP Visit Diagnosis Dysphagia, pharyngeal phase (R13.13)  Attention and concentration deficit following --  Frontal lobe and executive function deficit following --  Impact on safety and function --      CHL IP TREATMENT RECOMMENDATION 06/13/2018  Treatment Recommendations Therapy as outlined in treatment plan below     Prognosis 06/13/2018  Prognosis for Safe Diet Advancement Good  Barriers to Reach Goals Cognitive deficits  Barriers/Prognosis Comment --    CHL IP DIET RECOMMENDATION 06/13/2018  SLP Diet Recommendations Dysphagia 2 (Fine chop) solids;Honey thick liquids  Liquid Administration via Cup  Medication Administration Whole meds with puree  Compensations Slow rate;Small sips/bites  Postural Changes Seated upright at 90 degrees      CHL IP OTHER RECOMMENDATIONS 06/13/2018  Recommended Consults --  Oral Care Recommendations Oral care BID  Other Recommendations --      CHL IP FOLLOW UP RECOMMENDATIONS 06/13/2018  Follow up Recommendations Skilled Nursing facility      [facility] IP FREQUENCY AND DURATION 06/13/2018  Speech  Therapy Frequency (ACUTE ONLY) min 2x/week  Treatment Duration 2 weeks            CHL IP ORAL PHASE 06/13/2018  Oral Phase WFL  Oral - Pudding Teaspoon --  Oral - Pudding Cup --  Oral - Honey Teaspoon --  Oral - Honey Cup --  Oral - Nectar Teaspoon --  Oral - Nectar Cup --  Oral - Nectar Straw --  Oral - Thin Teaspoon --  Oral - Thin Cup --  Oral - Thin Straw --  Oral - Puree --  Oral - Mech Soft --  Oral - Regular --  Oral - Multi-Consistency --  Oral - Pill --  Oral Phase - Comment --    CHL IP PHARYNGEAL PHASE 06/13/2018  Pharyngeal Phase Impaired  Pharyngeal- Pudding Teaspoon --  Pharyngeal --  Pharyngeal- Pudding Cup --  Pharyngeal --  Pharyngeal- Honey Teaspoon Pharyngeal residue - valleculae;Pharyngeal residue - pyriform  Pharyngeal --  Pharyngeal- Honey Cup Pharyngeal residue - valleculae;Pharyngeal residue - pyriform  Pharyngeal --  Pharyngeal- Nectar Teaspoon Penetration/Aspiration during swallow  Pharyngeal Other (Comment)  Pharyngeal- Nectar Cup Penetration/Aspiration during swallow  Pharyngeal (No Data)  Pharyngeal- Nectar Straw --  Pharyngeal --  Pharyngeal- Thin Teaspoon --  Pharyngeal --  Pharyngeal- Thin Cup --  Pharyngeal --  Pharyngeal- Thin Straw --  Pharyngeal --  Pharyngeal- Puree Pharyngeal residue - pyriform;Pharyngeal residue - valleculae  Pharyngeal --  Pharyngeal- Mechanical Soft --  Pharyngeal --  Pharyngeal- Regular Pharyngeal residue - valleculae  Pharyngeal --  Pharyngeal- Multi-consistency --  Pharyngeal --  Pharyngeal- Pill --  Pharyngeal --  Pharyngeal Comment --     CHL IP CERVICAL ESOPHAGEAL PHASE 06/13/2018  Cervical Esophageal Phase (No Data)  Pudding Teaspoon --  Pudding Cup --  Honey Teaspoon --  Honey Cup --  Nectar Teaspoon --  Nectar Cup --  Nectar Straw --  Thin Teaspoon --  Thin Cup --  Thin Straw --  Puree --  Mechanical Soft --  Regular --  Multi-consistency --  Pill --  Cervical Esophageal Comment --     Royce Macadamia 06/13/2018, 2:10 PM

## 2018-06-13 NOTE — Evaluation (Signed)
Clinical/Bedside Swallow Evaluation Patient Details  Name: Ian Ramos MRN: [id number] Date of Birth: 18-Jan-1946  Today's Date: 06/13/2018 Time: SLP Start Time (ACUTE ONLY): 0843 SLP Stop Time (ACUTE ONLY): 0858 SLP Time Calculation (min) (ACUTE ONLY): 15 min  Past Medical History:  Past Medical History:  Diagnosis Date  . Arthritis   . Neuromuscular disorder [facility])    Past Surgical History:  Past Surgical History:  Procedure Laterality Date  . LEFT HEART CATH AND CORONARY ANGIOGRAPHY N/A 06/05/2018   Procedure: LEFT HEART CATH AND CORONARY ANGIOGRAPHY;  Surgeon: Swazil[place], Peter M, MD;  Location: [facility] INVASIVE CV LAB;  Service: Cardiovascular;  Laterality: N/A;  . POSTERIOR CERVICAL FUSION/FORAMINOTOMY N/A 05/23/2018   Procedure: Posterior Cervical Fusion with lateral mass fixation - C1 - C6 with laminectomy;  Surgeon: Julio Sicks, MD;  Location: [facility] OR;  Service: Neurosurgery;  Laterality: N/A;   HPI:  Ian Ramos is a 72 y.o. male with medical history significant for chronic kidney disease stage III, neuromuscular disorder, cervical stenosis with myelopathy status post cervical laminectomy on 05/23/2018, presented to the emergency department for evaluation of chest pain. MRI is showing acute/early subacute infarcts:  2 in left superiour frontal lobe and 2 in the right posterolateral parietal lobe. CXR No acute abnormality noted. SLE performed 9/8 with recommendation for continued ST. On 9/9 pt found to be unresponsive, not breathign and code blue called. Pt intubated 9/9-9/12   Assessment / Plan / Recommendation Clinical Impression  Suspected pharyngeal dysphagia indicated by immediate cough during 3 oz water test, delayed throat clearing. Pt is s/p recent stroke, extubated yesterday following 4 day intubation also increasing aspiration risk. Instrumental assessment recommended with FEES planned today at 11. SLP Visit Diagnosis: Dysphagia, pharyngeal phase (R13.13)    Aspiration  Risk       Diet Recommendation NPO        Other  Recommendations     Follow up Recommendations        Frequency and Duration            Prognosis        Swallow Study   General HPI: Ian Ramos is a 72 y.o. male with medical history significant for chronic kidney disease stage III, neuromuscular disorder, cervical stenosis with myelopathy status post cervical laminectomy on 05/23/2018, presented to the emergency department for evaluation of chest pain. MRI is showing acute/early subacute infarcts:  2 in left superiour frontal lobe and 2 in the right posterolateral parietal lobe. CXR No acute abnormality noted. SLE performed 9/8 with recommendation for continued ST. On 9/9 pt found to be unresponsive, not breathign and code blue called. Pt intubated 9/9-9/12 Type of Study: Bedside Swallow Evaluation Previous Swallow Assessment: none found Diet Prior to this Study: NPO Temperature Spikes Noted: No Respiratory Status: Nasal cannula History of Recent Intubation: Yes Length of Intubations (days): 4 days Date extubated: 06/12/18 Behavior/Cognition: Alert;Cooperative;Pleasant mood;Requires cueing Oral Cavity Assessment: Within Functional Limits Oral Care Completed by SLP: Yes Oral Cavity - Dentition: Adequate natural dentition Vision: Functional for self-feeding Self-Feeding Abilities: Needs assist Patient Positioning: Upright in bed Baseline Vocal Quality: Normal Volitional Cough: Strong    Oral/Motor/Sensory Function Overall Oral Motor/Sensory Function: Within functional limits   Ice Chips Ice chips: Not tested   Thin Liquid Thin Liquid: Impaired Presentation: Cup Oral Phase Impairments: Reduced labial seal Oral Phase Functional Implications: Right anterior spillage;Left anterior spillage Pharyngeal  Phase Impairments: Cough - Immediate;Throat Clearing - Delayed    Nectar Thick Nectar Thick  Liquid: Not tested   Honey Thick Honey Thick Liquid: Not tested   Puree  Puree: Within functional limits   Solid     Solid: Within functional limits      Ian Ramos 06/13/2018,9:19 AM  Ian Ramos.Ed Nurse, children's [phone] Office [phone]

## 2018-06-13 NOTE — Progress Notes (Addendum)
Progress Note  Patient Name: Ian Ramos Date of Encounter: 06/13/2018  Primary Cardiologist:   No primary care provider on file.  Subjective   The patient was extubated yesterday. Denies any chest pain, lays in bed comfortably.   Inpatient Medications    Scheduled Meds: .  stroke: mapping our early stages of recovery book   Does not apply Once  . aspirin  162 mg Per Tube Daily  . atorvastatin  80 mg Per Tube q1800  . chlorhexidine gluconate (MEDLINE KIT)  15 mL Mouth Rinse BID  . Chlorhexidine Gluconate Cloth  6 each Topical Daily  . cloNIDine  0.1 mg Transdermal Weekly  . clopidogrel  75 mg Per Tube Daily  . enoxaparin (LOVENOX) injection  40 mg Subcutaneous Q24H  . insulin aspart  0-15 Units Subcutaneous Q4H  . iopamidol  100 mL Intravenous Once  . mouth rinse  15 mL Mouth Rinse BID  . sodium chloride flush  10-40 mL Intracatheter Q12H  . sodium chloride flush  3 mL Intravenous Q12H   Continuous Infusions: . sodium chloride 10 mL/hr at 06/09/18 1610  . sodium chloride 75 mL/hr at 06/13/18 0700  . famotidine (PEPCID) IV 100 mL/hr at 06/12/18 1651  . feeding supplement (VITAL AF 1.2 CAL) 60 mL/hr at 06/12/18 1600  . norepinephrine (LEVOPHED) Adult infusion Stopped (06/12/18 0046)  . potassium PHOSPHATE IVPB (mEq)     PRN Meds: sodium chloride, acetaminophen, fentaNYL (SUBLIMAZE) injection, sodium chloride flush, sodium chloride flush  Vital Signs    Vitals:   06/13/18 0415 06/13/18 0500 06/13/18 0630 06/13/18 0700  BP: 121/66 129/66 119/61 122/84  Pulse: 79 78 75 74  Resp: (!) _0 (!) 22  Temp:      TempSrc:      SpO2: 98% 95% 95% 99%  Weight:  75.2 kg    Height:        Intake/Output Summary (Last 24 hours) at 06/13/2018 0717 Last data filed at 06/13/2018 0700 Gross per 24 hour  Intake 3383.74 ml  Output 1890 ml  Net 1493.74 ml   Filed Weights   06/06/18 0605 06/12/18 0500 06/13/18 0500  Weight: 74.7 kg 73.2 kg 75.2 kg   Telemetry    NSR  - Personally Reviewed  ECG    NA - Personally Reviewed  Physical Exam   GEN: NAD, mildly agitated Neck: No  JVD Cardiac: RRR, 2/6 systolic murmurs, rubs, or gallops.  Respiratory: Clear  to auscultation bilaterally. GI: Soft, nontender, non-distended  MS: No  edema; No deformity. Poor peripheral pulses. Neuro:  Nonfocal   Labs    Chemistry Recent Labs  Lab 06/11/18 0302 06/12/18 0431 06/13/18 0435  NA 134* 136 139  K 3.8 4.1 3.7  CL 106 109 112*  CO2 17* 20* 21*  GLUCOSE 115* 257* 153*  BUN 29* 28* 34*  CREATININE 1.56* 1.38* 1.25*  CALCIUM 8.1* 8.0* 7.6*  GFRNONAA 42* 49* 55*  GFRAA 49* 57* >60  ANIONGAP _1 Hematology Recent Labs  Lab 06/11/18 0302 06/12/18 0431 06/13/18 0435  WBC 11.1* 7.5 8.0  RBC 3.35* 3.11* 2.93*  HGB 10.0* 9.1* 8.6*  HCT 30.5* 28.3* 26.2*  MCV 91.0 91.0 89.4  MCH 29.9 29.3 29.4  MCHC 32.8 32.2 32.8  RDW 12.0 12.0 12.1  PLT 146* 128* 160   Cardiac EnzymesNo results for input(s): TROPONINI in the last 168 hours. No results for input(s): TROPIPOC in the last 168 hours.   BNPNo  results for input(s): BNP, PROBNP in the last 168 hours.   DDimer No results for input(s): DDIMER in the last 168 hours.   Radiology    Dg Chest Port 1 View  Result Date: 06/12/2018 CLINICAL DATA:  Respiratory failure EXAM: PORTABLE CHEST 1 VIEW COMPARISON:  06/09/2018 FINDINGS: Endotracheal tube and left subclavian central line are again noted and stable. Feeding catheter has been placed and courses towards the stomach. Previously seen nasogastric catheter has been removed. Postsurgical changes in the cervical spine are noted. Cardiac shadow is stable. Lungs are well aerated bilaterally without focal infiltrate or sizable effusion. IMPRESSION: Tubes and lines as described above. No acute abnormality noted. Electronically Signed   By: Ian Ramos M.D.   On: 06/12/2018 09:59   Dg Abd Portable 1v  Result Date: 06/11/2018 CLINICAL DATA:  Feeding tube  placement EXAM: PORTABLE ABDOMEN - 1 VIEW COMPARISON:  06/10/2018 FINDINGS: Feeding tube has been placed with the tip in the distal descending duodenum. Nonobstructive bowel gas pattern. IMPRESSION: Feeding tube in the distal descending duodenum. Electronically Signed   By: Ian Ramos M.D.   On: 06/11/2018 11:12   Cardiac Studies   Cardiac cath:  Left Anterior Descending  Prox LAD lesion 75% stenosed  Prox LAD lesion is 75% stenosed. The lesion is eccentric.  Second Septal Branch  Left Circumflex  Second Obtuse Marginal Branch  Ost 2nd Mrg to 2nd Mrg lesion 75% stenosed  Ost 2nd Mrg to 2nd Mrg lesion is 75% stenosed.  Right Coronary Artery  Prox RCA to Mid RCA lesion 30% stenosed  Prox RCA to Mid RCA lesion is 30% stenosed.  Acute Marginal Branch  Collaterals  Acute Mrg filled by collaterals from 2nd Sept.    Acute Mrg lesion 100% stenosed  Acute Mrg lesion is 100% stenosed. The lesion is chronically occluded with left-to-right collateral flow.     Patient Profile     72 y.o. male with a history of chronic kidney disease stage III and cervical disc disease who underwent recent cervical laminectomy on 05/23/2018 without any complications. On 05/28/2018 he awakened at 4 AM with severe substernal chest pain and tried to sit up the pain became more severe. There were no associated symptoms of shortness of breath, diaphoresis or nausea. He called EMS and was given aspirin and chest pain resolved but he continued to have left shoulder pain although he says that he has intermittent shoulder pain chronically  Assessment & Plan    Respiratory failure requiring intubation on 06/09/2018, extubated yesterday, doing well,, off Levophed CVP low, no fluid overload, hold lasix  CAD as above with plans for optimal medical management with PCI if clear anginal refractory symptoms.  If Crea improves we will discuss PCI/LAD with the interventional team.  Acute on chronic kidney insufficiency: Crea  1.7>1.4>1.5-->1.98->1.56->1.38-1.25, most probably sec to hypotension on 9/9.  Carotid stenosis: Dr Ian Ramos asked Dr Ian Ramos to evaluate him for possible carotid stenting.  If this is possible, neurology would like this done within the 2-week window. Vascular team would arther wait given respiratory failure and intubation. On Plavix.  Anemia - worsening  For questions or updates, please contact [place] Please consult [email or web address] for contact info under Cardiology/STEMI.   Signed, Ena Dawley, MD  06/13/2018, 7:17 AM

## 2018-06-13 NOTE — Progress Notes (Signed)
CCM called about replacing potassium, at a level of 3.7; and Phos at a level of 1.2. Awaiting order

## 2018-06-14 DIAGNOSIS — I429 Cardiomyopathy, unspecified: Secondary | ICD-10-CM

## 2018-06-14 DIAGNOSIS — I25119 Atherosclerotic heart disease of native coronary artery with unspecified angina pectoris: Secondary | ICD-10-CM

## 2018-06-14 LAB — PHOSPHORUS: PHOSPHORUS: 2.4 mg/dL — AB (ref 2.5–4.6)

## 2018-06-14 LAB — BASIC METABOLIC PANEL
ANION GAP: 7 (ref 5–15)
BUN: 33 mg/dL — ABNORMAL HIGH (ref 8–23)
CO2: 22 mmol/L (ref 22–32)
Calcium: 7.7 mg/dL — ABNORMAL LOW (ref 8.9–10.3)
Chloride: 108 mmol/L (ref 98–111)
Creatinine, Ser: 1.18 mg/dL (ref 0.61–1.24)
GFR calc Af Amer: >60 mL/min (ref >60)
GFR, EST NON AFRICAN AMERICAN: 59 mL/min — AB (ref >60)
GLUCOSE: 133 mg/dL — AB (ref 70–99)
Potassium: 3.8 mmol/L (ref 3.5–5.1)
Sodium: 137 mmol/L (ref 135–145)

## 2018-06-14 LAB — GLUCOSE, CAPILLARY
GLUCOSE-CAPILLARY: 120 mg/dL — AB (ref 70–99)
GLUCOSE-CAPILLARY: 119 mg/dL — AB (ref 70–99)
Glucose-Capillary: 117 mg/dL — ABNORMAL HIGH (ref 70–99)
Glucose-Capillary: 129 mg/dL — ABNORMAL HIGH (ref 70–99)
Glucose-Capillary: 131 mg/dL — ABNORMAL HIGH (ref 70–99)
Glucose-Capillary: 134 mg/dL — ABNORMAL HIGH (ref 70–99)

## 2018-06-14 LAB — CBC
HEMATOCRIT: 24.9 % — AB (ref 39.0–52.0)
Hemoglobin: 8.1 g/dL — ABNORMAL LOW (ref 13.0–17.0)
MCH: 29.7 pg (ref 26.0–34.0)
MCHC: 32.5 g/dL (ref 30.0–36.0)
MCV: 91.2 fL (ref 78.0–100.0)
PLATELETS: 165 10*3/uL (ref 150–400)
RBC: 2.73 MIL/uL — ABNORMAL LOW (ref 4.22–5.81)
RDW: 12.6 % (ref 11.5–15.5)
WBC: 6.1 10*3/uL (ref 4.0–10.5)

## 2018-06-14 LAB — MAGNESIUM: Magnesium: 1.6 mg/dL — ABNORMAL LOW (ref 1.7–2.4)

## 2018-06-14 MED ORDER — MAGNESIUM SULFATE 2 GM/50ML IV SOLN
2.0000 g | Freq: Once | INTRAVENOUS | Status: AC
  Administered 2018-06-14: 2 g via INTRAVENOUS
  Filled 2018-06-14: qty 50

## 2018-06-14 NOTE — Progress Notes (Signed)
Patient had 3 episodes of unsustained sinus tach, HR 130s. Patient was diaphoretic and stated he felt like he was falling. Episodes lasted 5-10 minutes. EKG obtained. Notified MD on call. Will continue to monitor.

## 2018-06-14 NOTE — Progress Notes (Signed)
PROGRESS NOTE                                                                                                                                                                                                             Patient Demographics:    Ian Ramos, is a 72 y.o. male, DOB - 1946/06/16, GNF:[id number]  Admit date - 05/28/2018   Admitting Physician Vianne Bulls, MD  Outpatient Primary MD for the patient is Libby Maw, MD  LOS - 10   Chief Complaint  Patient presents with  . Chest Pain       Brief Narrative    72 year old male patient history of stage III chronic kidney disease, cervical spine stenosis status post recent cervical laminectomy August 2019.  Presented to the emergency room on 8/28 with chief complaint of chest pain, shoulder pain and substernal discomfort..  Was found to have some esophageal thickening, possibly some esophageal dysmotility, etiology was never clearly identified but resolved.  He was found to have new dilated cardiomyopathy with EF 35% and diffuse hypokinesis, and bundle branch block, but it was unclear as to whether or not if this was the primary cause of his discomfort.  The plan was to proceed with iliac ischemia evaluation.  He underwent cardiac catheterization on 9/5: Showed moderate obstructive coronary artery disease with 75% occluded proximal LAD 75% OM 2 occluded RV marginal branch with left to right collaterals.  This was not considered critical CAD and medical management was recommended. On 9/6 the patient developed acute mental status change.  On initial eval he was confused, preservation, and had right upper extremity drift and right visual field cut.  CT brain history left temporal lobe infarct felt possibly embolic in etiology.  Carotid ultrasound showed likely left carotid occlusion with high-grade right carotid stenosis.  Vascular surgery was consulted.  Plavix was recommended and delaying  carotid endarterectomy for further recovery was felt best course of action. 9/7: Continues to demonstrate aphasia.  Also with ongoing left upper extremity weakness.  MRI showed right MCA/PCA watershed and right parietal punctate infarcts felt more consistent with high-grade ICA stenosis on the left. 9/8: Patient vomited twice during the day while family was assisting with feeding 9/9: Found unresponsive in chair.  Blood pressure 48/32 heart rate 58 area not clear that he lost pulses.  Pulse  oximetry not recorded.  He was apneic, Code blue was called patient was intubated and transferred to the intensive care.  Consults: GI 8/29: Found asymptomatic esophagitis.  Also evidence of esophageal dysmotility on esophagram.  GI signed off on 8/30 with final recommendations for daily PPI x4 weeks and routine cancer screening Cardiology consulted 9/3 for chest pain.  Underwent left heart cath which showed diffuse coronary artery disease but noncritical recommended medical therapy, because he had bradycardia and CKD ARB was held he was recommended to continue hydralazine, nitrates and diuretics 9/7: Neurology consulted for stroke Vascular surgery consulted 9/6: For bilateral carotid artery disease: Started on Plavix.  Recommending gentle hydration.  Considering the patient for carotid stenting in the next 2 weeks    Subjective:    Ian Ramos today has, No headache, No chest pain, No abdominal pain - No Nausea,   Assessment  & Plan :    Principal Problem:   Intractable pain Active Problems:   Cervical myelopathy (HCC)   Chest pain   CKD (chronic kidney disease), stage III (HCC)   Esophageal thickening   LBBB (left bundle branch block)   Postoperative anemia   DCM (dilated cardiomyopathy) (HCC)   Acute systolic CHF (congestive heart failure) ([place])   Coronary artery disease due to lipid rich plaque   Cerebrovascular accident (CVA) due to bilateral stenosis of carotid arteries ([place])   On  mechanically assisted ventilation ([place])   Acute respiratory failure ([place])   Essential hypertension   Acute encephalopathy    Acute hypoxemic respiratory failure -  requiring intubation and mechanical ventilation. Unclear at this time what the etiology of his respiratory arrest was. -Assist full extubated, significantly improving, encouraged to use incentive spirometry and flutter valve,.   Acute bilateral CVA: Initially felt to be embolic, though with critical bilateral carotid stenosis and hypotension as well as watershed distribution of most infarcts, ICA stenosis is felt to be etiology. Locations include periventricular left M1/M2 distribution, right MCA/PCA distribution, and right temporoparietal cortex with resultant confusion and aphasia.   - On antiplatelet per neurology, statin - HbA1c 6.1%,  Bilateral carotid artery stenosis: Right ICA severely occluded (80-99%) and left ICA said to be completely occluded.  - Ideally 130-170mHg SBP  - Vascular surgery consulted.Dr. FOneida Alarnote from September 10 indicated considering intervention possibly within a 4 to 6-week interval depending on clinical progress. - ASA, plavix, statin   Cardiomyopathy with LVEF 35 to 40% range and diffuse hypokinesis by echocardiogram. -Management per cardiology Cervical myelopathy status post laminectomy -By PT/OT, recommendation for CIR  Anemia of chronic disease -Need to monitor and transfuse as needed  Acute on chronic renal insufficiency with improvement in creatinine down to 1.18 at this time.  Acute encephalopathy, acute delirium: -  Waxing/waning features most consistent with delirium,  -Advanced by SLP  Urinary retention. - Keep foley in place for now  Hypertension -Continue with clonidine patch  Code Status : Full  Family Communication  : Fiancee at bedside  Disposition Plan  : CIR   Consults  :    Procedures  :  Left heart cath 9/5 showing moderate noncritical coronary  artery disease.  75% occluded proximal LAD 75% occluded OM.  Medical management was recommended  Significant Diagnostic Tests: Echocardiogram 8/29:EF 35 to 40%.  Diffuse hypokinesis.  Doppler parameters consistent with grade 1 diastolic dysfunction. 9/6: MRI brainExtensive motion artifact.  Minimal flow related signal in the left cervical ICA and absent flow of the left ICA representing occlusion or  proximal high-grade stenosis.  There is a large anterior communicating artery providing collateral circulation to the left MCA and left PCA. 9/6: CT brain: Subtle hypodensity in the anterior mid left temporal lobe 9/4: Nuclear stress test EF 39% findings were consistent with ischemia felt to be intermittent risk 8/30: Esophagram showing moderate esophageal dysmotility 9/12 extubated, confused  DVT Prophylaxis  :  lovenox  Lab Results  Component Value Date   PLT 165 06/14/2018    Antibiotics  :    Anti-infectives (From admission, onward)   None        Objective:   Vitals:   06/14/18 0041 06/14/18 0400 06/14/18 0850 06/14/18 1242  BP: (!) 104/54 122/60 (!) 112/54 135/61  Pulse: 78 74    Resp: 18 18    Temp: 98.2 F (36.8 C) 98 F (36.7 C) 98.6 F (37 C) 97.8 F (36.6 C)  TempSrc: Oral Oral Oral Oral  SpO2: 100% 100% 100% 100%  Weight:  76 kg    Height:        Wt Readings from Last 3 Encounters:  06/14/18 76 kg  05/23/18 79.4 kg  05/16/18 79.4 kg     Intake/Output Summary (Last 24 hours) at 06/14/2018 1555 Last data filed at 06/14/2018 0500 Gross per 24 hour  Intake -  Output 1250 ml  Net -1250 ml     Physical Exam  Awake , fused, but pleasant Symmetrical Chest wall movement, Good air movement bilaterally, CTAB RRR,No Gallops,Rubs , No Parasternal Heave +ve B.Sounds, Abd Soft, No tenderness, No rebound - guarding or rigidity. No Cyanosis, Clubbing or edema, No new Rash or bruise       Data Review:    CBC Recent Labs  Lab 06/10/18 0327 06/11/18 0302  06/12/18 0431 06/13/18 0435 06/14/18 0214  WBC 18.5* 11.1* 7.5 8.0 6.1  HGB 11.0* 10.0* 9.1* 8.6* 8.1*  HCT 33.7* 30.5* 28.3* 26.2* 24.9*  PLT 195 146* 128* 160 165  MCV 90.8 91.0 91.0 89.4 91.2  MCH 29.6 29.9 29.3 29.4 29.7  MCHC 32.6 32.8 32.2 32.8 32.5  RDW 12.1 12.0 12.0 12.1 12.6  LYMPHSABS 0.8 1.1  --   --   --   MONOABS 1.5* 0.9  --   --   --   EOSABS 0.1 0.1  --   --   --   BASOSABS 0.0 0.0  --   --   --     Chemistries  Recent Labs  Lab 06/10/18 0327 06/11/18 0302 06/11/18 1813 06/12/18 0431 06/12/18 1648 06/13/18 0435 06/14/18 0214  NA 134* 134*  --  136  --  139 137  K 3.9 3.8  --  4.1  --  3.7 3.8  CL 101 106  --  109  --  112* 108  CO2 20* 17*  --  20*  --  21* 22  GLUCOSE 157* 115*  --  257*  --  153* 133*  BUN 36* 29*  --  28*  --  34* 33*  CREATININE 1.98* 1.56*  --  1.38*  --  1.25* 1.18  CALCIUM 8.2* 8.1*  --  8.0*  --  7.6* 7.7*  MG  --   --  1.6* 1.7 2.0 2.1 1.6*   ------------------------------------------------------------------------------------------------------------------ No results for input(s): CHOL, HDL, LDLCALC, TRIG, CHOLHDL, LDLDIRECT in the last 72 hours.  Lab Results  Component Value Date   HGBA1C 6.1 (H) 06/06/2018   ------------------------------------------------------------------------------------------------------------------ No results for input(s): TSH, T4TOTAL, T3FREE, THYROIDAB in the last  72 hours.  Invalid input(s): FREET3 ------------------------------------------------------------------------------------------------------------------ No results for input(s): VITAMINB12, FOLATE, FERRITIN, TIBC, IRON, RETICCTPCT in the last 72 hours.  Coagulation profile No results for input(s): INR, PROTIME in the last 168 hours.  No results for input(s): DDIMER in the last 72 hours.  Cardiac Enzymes No results for input(s): CKMB, TROPONINI, MYOGLOBIN in the last 168 hours.  Invalid input(s):  CK ------------------------------------------------------------------------------------------------------------------ No results found for: BNP  Inpatient Medications  Scheduled Meds: .  stroke: mapping our early stages of recovery book   Does not apply Once  . aspirin  162 mg Per Tube Daily  . atorvastatin  80 mg Per Tube q1800  . chlorhexidine gluconate (MEDLINE KIT)  15 mL Mouth Rinse BID  . Chlorhexidine Gluconate Cloth  6 each Topical Daily  . cloNIDine  0.1 mg Transdermal Weekly  . clopidogrel  75 mg Per Tube Daily  . enoxaparin (LOVENOX) injection  40 mg Subcutaneous Q24H  . insulin aspart  0-15 Units Subcutaneous Q4H  . iopamidol  100 mL Intravenous Once  . mouth rinse  15 mL Mouth Rinse BID  . sodium chloride flush  10-40 mL Intracatheter Q12H  . sodium chloride flush  3 mL Intravenous Q12H   Continuous Infusions: . sodium chloride 10 mL/hr at 06/09/18 1610  . sodium chloride 75 mL/hr at 06/13/18 0700  . famotidine (PEPCID) IV 20 mg (06/13/18 1709)  . feeding supplement (VITAL AF 1.2 CAL) 1,500 mL (06/13/18 1822)  . magnesium sulfate 1 - 4 g bolus IVPB    . norepinephrine (LEVOPHED) Adult infusion Stopped (06/12/18 0046)   PRN Meds:.sodium chloride, acetaminophen, fentaNYL (SUBLIMAZE) injection, RESOURCE THICKENUP CLEAR, sodium chloride flush, sodium chloride flush  Micro Results Recent Results (from the past 240 hour(s))  Culture, respiratory (non-expectorated)     Status: None   Collection Time: 06/09/18  4:48 PM  Result Value Ref Range Status   Specimen Description TRACHEAL ASPIRATE  Final   Special Requests NONE  Final   Gram Stain   Final    RARE WBC PRESENT, PREDOMINANTLY PMN FEW GRAM POSITIVE COCCI FEW GRAM POSITIVE RODS    Culture   Final    Consistent with normal respiratory flora. Performed at [facility], [place] [address], [place], [place] [address]    Report Status 06/11/2018 FINAL  Final    Radiology Reports Dg Chest 2 View  Result Date:  05/28/2018 CLINICAL DATA:  Chest pain and weakness for a few days. The patient underwent cervical fusion 05/23/2018. EXAM: CHEST - 2 VIEW COMPARISON:  PA and lateral chest 05/26/2018. FINDINGS: Lung volumes are low with minimal atelectasis in the left base. No pneumothorax or pleural effusion. Heart size is normal. No acute or focal bony abnormality. Degenerative disease about the shoulders noted. IMPRESSION: No acute disease. Electronically Signed   By: Inge Rise M.D.   On: 05/28/2018 11:57   Dg Chest 2 View  Result Date: 05/26/2018 CLINICAL DATA:  72 year old male with weakness and cough. Status post recent cervical spine surgery. EXAM: CHEST - 2 VIEW COMPARISON:  None. FINDINGS: Cardiac and mediastinal contours are within normal limits. Atherosclerotic calcifications are present within the transverse aorta. Mild linear opacity in the lingula most consistent with atelectasis. No evidence of pulmonary edema, pleural effusion or pneumothorax. No suspicious pulmonary nodule or mass. Degenerative changes are present in both shoulders including the acromioclavicular joints and glenohumeral joints. High-riding humeral heads bilaterally with decreased subacromial space consistent with chronic rotator cuff injuries. No acute osseous abnormality.  IMPRESSION: 1. Linear opacity in the lingula most consistent with atelectasis. 2.  Aortic Atherosclerosis (ICD10-170.0) 3. Bilateral degenerative changes in the glenohumeral and acromioclavicular joints. Electronically Signed   By: Jacqulynn Cadet M.D.   On: 05/26/2018 08:01   Dg Cervical Spine 2-3 Views  Result Date: 05/23/2018 CLINICAL DATA:  C1-C6 decompression and fusion. EXAM: CERVICAL SPINE - 2-3 VIEW; DG C-ARM 61-120 MIN COMPARISON:  MRI 01/01/2018 FINDINGS: C-arm images show posterior fusion with lateral mass screws and posterior rods from C1 through C6. No radiographically detectable complication on this limited imaging. IMPRESSION: Posterior fusion from  C1 through C6. Electronically Signed   By: Nelson Chimes M.D.   On: 05/23/2018 16:26   Ct Cervical Spine Wo Contrast  Result Date: 05/28/2018 CLINICAL DATA:  Chest pain.  Recent cervical spine surgery. EXAM: CT CERVICAL SPINE WITHOUT CONTRAST TECHNIQUE: Multidetector CT imaging of the cervical spine was performed without intravenous contrast. Multiplanar CT image reconstructions were also generated. COMPARISON:  MRI cervical spine January 01, 2018 FINDINGS: ALIGNMENT: Straightened cervical lordosis.  No malalignment. SKULL BASE AND VERTEBRAE: Vertebral bodies intact. Interval C1 through C6 laminectomies. Intact well-seated lateral mass screws. The RIGHT C3, C4, C5 and C6 screws are intra-articular. The LEFT C3 through C6 facet screws are intra-articular, the LEFT facet screw extends into the foramen transversarium. The LEFT C6 screw extends into the cephalad aspect of the foramen transversarium. Intact hardware. Non incorporated posterior bone graft material. Stable moderate to severe disc height loss C3-4 through T1-2 with endplate sclerosis and marginal spurring compatible with degenerative discs. Stable geographic subcentimeter cyst and posterior odontoid process without cortical disruption. 1 cm calcified pannus posterior to the odontoid process seen with CPPD without destructive bony changes. SOFT TISSUES AND SPINAL CANAL: And gas within the surgical bed and ventral epidural space at C1-2 consistent with recent surgery. Small prevertebral effusion is likely postoperative. Mild calcific atherosclerosis aortic arch. DISC LEVELS: Canal obscured by hardware streak artifact. Neural foraminal narrowing all cervical levels varying from moderate to severe. UPPER CHEST: Lung apices are clear. OTHER: None. IMPRESSION: 1. Status post recent C1 through C6 laminectomies and posterior instrumentation. Multilevel intra-articular facet screws, LEFT C2 screw in foramen transversarium. 2. Postoperative fluid collection and gas  consistent with recent surgery/seroma, pseudomeningocele not excluded. If there is clinical concern for infection, consider sampling. 3. No fracture deformity or malalignment. Aortic Atherosclerosis (ICD10-I70.0). Electronically Signed   By: Elon Alas M.D.   On: 05/28/2018 16:32   [name]  Result Date: 06/06/2018 CLINICAL DATA:  72 y/o M; acute altered mental status and right-sided weakness. Stroke evaluation. EXAM: MRI HEAD WITHOUT CONTRAST MRA HEAD WITHOUT CONTRAST TECHNIQUE: Axial DWI, coronal DWI, axial T2 FLAIR sequences of the head were acquired. Angiographic images of the head were obtained using MRA technique without contrast. The patient was unable to continue and additional sequences were not acquired. COMPARISON:  06/06/2018 CT head.  05/28/2018 CT cervical spine. FINDINGS: MRI HEAD FINDINGS Brain: Subcentimeter foci of reduced diffusion compatible with acute/early subacute infarction are present, 2 in the left superior frontal lobe, and 2 in the right posterior lateral parietal lobe. No signal abnormality in the left anterior temporal lobe, findings on CT likely related to artifact. Several nonspecific T2 FLAIR hyperintensities in subcortical and periventricular white matter are compatible with mild chronic microvascular ischemic changes for age. Mild volume loss of the brain. No hydrocephalus, extra-axial collection, or herniation. Vascular: As below. Skull and upper cervical spine: Partially visualized cervical spinal fusion and  laminectomy. Large fluid collection within the postoperative bed as seen on prior CT of the cervical spine. No reduced diffusion of the visible portions of the fluid collection to indicate infection. Sinuses/Orbits: Negative. Other: None. MRA HEAD FINDINGS Motion degraded study. Anterior circulation: Minimal flow related signal within the left cervical internal carotid artery and no appreciable flow related signal of the left petrous and cavernous  segments of the left internal carotid artery. Faint flow related signal within the terminal left ICA, probably retrograde. Right internal carotid artery is patent, moderate to severe stenosis of the right cavernous segment. Normal flow related signal within the right MCA and bilateral ACA distributions. Large anterior communicating artery largely perfusing the left MCA distribution which is patent. Asymmetry in the MCA distribution signal, left less than right, is likely due to signal loss from delayed transit via A-comm collateralization. Posterior circulation: Faint flow related signal in the left posterior cerebral artery which appears to be a fetal PCA is likely due to signal loss from delayed transit due to A-comm collateralization from the right ICA. Normal flow related signal within the vertebral arteries, basilar artery, and right PCA. IMPRESSION: MRI head: 1. Diffusion and T2 FLAIR weighted sequences were acquired. The patient was unable to continue and additional sequences were not acquired. 2. Subcentimeter acute/early subacute infarctions, 2 in the left superior frontal lobe, and 2 in the right posterolateral parietal lobe. No associated mass effect. 3. Mild chronic microvascular ischemic changes and volume loss of the brain. MRA head: 1. Extensive motion artifact. 2. Minimal flow related signal in the left cervical ICA and absent flow related signal within the petrous and cavernous segments of left ICA. Findings may represent occlusion or proximal high-grade stenosis. Age indeterminate. 3. Large anterior communicating artery providing collateral circulation to the left MCA and left PCA distributions. Decreased signal in left MCA and left PCA distribution from increased transit time. CTA of the head and neck can better assess vessel stenosis and patency. If clinically indicated consider CT perfusion of the head to assess for brain oligemia. These results will be called to the ordering clinician or  representative by the Radiologist Assistant, and communication documented in the PACS or zVision Dashboard. Electronically Signed   By: Kristine Garbe M.D.   On: 06/06/2018 18:14   Dg Esophagus  Result Date: 05/30/2018 CLINICAL DATA:  Esophageal wall thickening on CT EXAM: ESOPHOGRAM/BARIUM SWALLOW TECHNIQUE: Single contrast examination was performed using  thin barium. FLUOROSCOPY TIME:  Fluoroscopy Time:  42 seconds Radiation Exposure Index (if provided by the fluoroscopic device): 12.5 mGy Number of Acquired Spot Images: 8 COMPARISON:  CTA chest dated 05/28/2018 FINDINGS: Limited evaluation due to difficulty with patient positioning and inability to perform double-contrast evaluation. Single contrast evaluation in the recumbent supine position was performed. No mucosal irregularity to suggest esophageal mass. No fixed esophageal narrowing or stricture. Moderate esophageal dysmotility. No evidence of hiatal hernia. Visualized stomach is grossly unremarkable. IMPRESSION: Moderate esophageal dysmotility. Otherwise negative single-contrast esophagram. Electronically Signed   By: Julian Hy M.D.   On: 05/30/2018 09:30   Nm Myocar Multi W/spect W/wall Motion / Ef  Result Date: 06/04/2018  There was no ST segment deviation noted during stress.  Defect 1: There is a medium defect of moderate severity present in the basal anteroseptal and mid anteroseptal location.  Defect 2: There is a medium defect of severe severity present in the basal inferior and mid inferior location.  This is an intermediate risk study.  Findings consistent with ischemia.  Nuclear stress EF: 39%.  The left ventricular ejection fraction is moderately decreased (30-44%).  Abnormal, intermediate risk stress nuclear study with predominantly fixed anteroseptal defect possibly secondary to left bundle branch block; mild to moderate inferior ischemia; gated ejection fraction 39% with hypokinesis of the septum, mild left  ventricular enlargement.   Dg Chest Port 1 View  Result Date: 06/12/2018 CLINICAL DATA:  Respiratory failure EXAM: PORTABLE CHEST 1 VIEW COMPARISON:  06/09/2018 FINDINGS: Endotracheal tube and left subclavian central line are again noted and stable. Feeding catheter has been placed and courses towards the stomach. Previously seen nasogastric catheter has been removed. Postsurgical changes in the cervical spine are noted. Cardiac shadow is stable. Lungs are well aerated bilaterally without focal infiltrate or sizable effusion. IMPRESSION: Tubes and lines as described above. No acute abnormality noted. Electronically Signed   By: Inez Catalina M.D.   On: 06/12/2018 09:59   Dg Chest Port 1 View  Result Date: 06/09/2018 CLINICAL DATA:  Central line placement confirmation EXAM: PORTABLE CHEST 1 VIEW COMPARISON:  06/09/2018 1645 hours FINDINGS: Endotracheal and NG tubes stable. Left subclavian central venous catheter placed. Tip is in the mid SVC. There is no ensuing left pneumothorax. Normal heart size. Clear and under aerated lungs. IMPRESSION: Left subclavian central venous catheter placement with its tip in the mid SVC and no pneumothorax per Clear lungs. Stable endotracheal and NG tubes. Electronically Signed   By: Marybelle Killings M.D.   On: 06/09/2018 18:09   Dg Chest Port 1 View  Result Date: 06/09/2018 CLINICAL DATA:  72 year old male with acute respiratory failure. Subsequent encounter. EXAM: PORTABLE CHEST 1 VIEW COMPARISON:  05/28/2018 CT and chest x-ray. FINDINGS: Endotracheal tube tip centrally located 4.9 cm above the carina. Nasogastric tube tip gastric fundus level. No infiltrate or congestive heart failure. No obvious pneumothorax. Heart size within normal limits. Calcified mildly tortuous aorta. IMPRESSION: 1. No infiltrate or congestive heart failure. 2.  Aortic Atherosclerosis (ICD10-I70.0). Electronically Signed   By: Genia Del M.D.   On: 06/09/2018 16:57   Dg Abd Portable 1v  Result  Date: 06/11/2018 CLINICAL DATA:  Feeding tube placement EXAM: PORTABLE ABDOMEN - 1 VIEW COMPARISON:  06/10/2018 FINDINGS: Feeding tube has been placed with the tip in the distal descending duodenum. Nonobstructive bowel gas pattern. IMPRESSION: Feeding tube in the distal descending duodenum. Electronically Signed   By: Rolm Baptise M.D.   On: 06/11/2018 11:12   Dg Abd Portable 1v  Result Date: 06/10/2018 CLINICAL DATA:  Status post OG tube placement. EXAM: PORTABLE ABDOMEN - 1 VIEW COMPARISON:  None. FINDINGS: OG tube is in place with the tip just within the stomach. Side port is in the distal esophagus. Recommend advancement of 5-6 cm. IMPRESSION: As above. Electronically Signed   By: Inge Rise M.D.   On: 06/10/2018 10:27   Dg C-arm 1-60 Min  Result Date: 05/23/2018 CLINICAL DATA:  C1-C6 decompression and fusion. EXAM: CERVICAL SPINE - 2-3 VIEW; DG C-ARM 61-120 MIN COMPARISON:  MRI 01/01/2018 FINDINGS: C-arm images show posterior fusion with lateral mass screws and posterior rods from C1 through C6. No radiographically detectable complication on this limited imaging. IMPRESSION: Posterior fusion from C1 through C6. Electronically Signed   By: Nelson Chimes M.D.   On: 05/23/2018 16:26   Mr Jodene Nam Head Wo Contrast  Result Date: 06/06/2018 CLINICAL DATA:  72 y/o M; acute altered mental status and right-sided weakness. Stroke evaluation. EXAM: MRI HEAD WITHOUT CONTRAST MRA HEAD WITHOUT CONTRAST TECHNIQUE: Axial DWI, coronal  DWI, axial T2 FLAIR sequences of the head were acquired. Angiographic images of the head were obtained using MRA technique without contrast. The patient was unable to continue and additional sequences were not acquired. COMPARISON:  06/06/2018 CT head.  05/28/2018 CT cervical spine. FINDINGS: MRI HEAD FINDINGS Brain: Subcentimeter foci of reduced diffusion compatible with acute/early subacute infarction are present, 2 in the left superior frontal lobe, and 2 in the right posterior  lateral parietal lobe. No signal abnormality in the left anterior temporal lobe, findings on CT likely related to artifact. Several nonspecific T2 FLAIR hyperintensities in subcortical and periventricular white matter are compatible with mild chronic microvascular ischemic changes for age. Mild volume loss of the brain. No hydrocephalus, extra-axial collection, or herniation. Vascular: As below. Skull and upper cervical spine: Partially visualized cervical spinal fusion and laminectomy. Large fluid collection within the postoperative bed as seen on prior CT of the cervical spine. No reduced diffusion of the visible portions of the fluid collection to indicate infection. Sinuses/Orbits: Negative. Other: None. MRA HEAD FINDINGS Motion degraded study. Anterior circulation: Minimal flow related signal within the left cervical internal carotid artery and no appreciable flow related signal of the left petrous and cavernous segments of the left internal carotid artery. Faint flow related signal within the terminal left ICA, probably retrograde. Right internal carotid artery is patent, moderate to severe stenosis of the right cavernous segment. Normal flow related signal within the right MCA and bilateral ACA distributions. Large anterior communicating artery largely perfusing the left MCA distribution which is patent. Asymmetry in the MCA distribution signal, left less than right, is likely due to signal loss from delayed transit via A-comm collateralization. Posterior circulation: Faint flow related signal in the left posterior cerebral artery which appears to be a fetal PCA is likely due to signal loss from delayed transit due to A-comm collateralization from the right ICA. Normal flow related signal within the vertebral arteries, basilar artery, and right PCA. IMPRESSION: MRI head: 1. Diffusion and T2 FLAIR weighted sequences were acquired. The patient was unable to continue and additional sequences were not acquired.  2. Subcentimeter acute/early subacute infarctions, 2 in the left superior frontal lobe, and 2 in the right posterolateral parietal lobe. No associated mass effect. 3. Mild chronic microvascular ischemic changes and volume loss of the brain. MRA head: 1. Extensive motion artifact. 2. Minimal flow related signal in the left cervical ICA and absent flow related signal within the petrous and cavernous segments of left ICA. Findings may represent occlusion or proximal high-grade stenosis. Age indeterminate. 3. Large anterior communicating artery providing collateral circulation to the left MCA and left PCA distributions. Decreased signal in left MCA and left PCA distribution from increased transit time. CTA of the head and neck can better assess vessel stenosis and patency. If clinically indicated consider CT perfusion of the head to assess for brain oligemia. These results will be called to the ordering clinician or representative by the Radiologist Assistant, and communication documented in the PACS or zVision Dashboard. Electronically Signed   By: Kristine Garbe M.D.   On: 06/06/2018 18:14   Ct Angio Chest Aorta W And/or Wo Contrast  Result Date: 05/28/2018 CLINICAL DATA:  Centralized non radiating chest pain beginning last evening. Recent spinal surgery. Evaluate for pulmonary embolism. EXAM: CT ANGIOGRAPHY CHEST WITH CONTRAST TECHNIQUE: Multidetector CT imaging of the chest was performed using the standard protocol during bolus administration of intravenous contrast. Multiplanar CT image reconstructions and MIPs were obtained to evaluate the vascular  anatomy. CONTRAST:  165m ISOVUE-370 IOPAMIDOL (ISOVUE-370) INJECTION 76% COMPARISON:  Chest radiograph - 05/28/2018 FINDINGS: Vascular Findings: There is adequate opacification of the pulmonary arterial system with the main pulmonary artery measuring 1,251 Hounsfield units. There are no discrete filling defects within the pulmonary arterial tree to  suggest pulmonary embolism. Enlarged caliber of the main pulmonary artery measuring 35 mm in diameter. Cardiomegaly.  No pericardial effusion. Minimal amount of atherosclerotic plaque within a normal caliber thoracic aorta. Conventional configuration of the aortic arch. The branch vessels of the aortic arch appear patent throughout their imaged course. Review of the MIP images confirms the above findings. ---------------------------------------------------------------------------------- Nonvascular Findings: Mediastinum/Lymph Nodes: Potential mild circumferential wall thickening of the esophagus (representative image 46, series 6), potentially accentuated due to underdistention though conceivably esophagitis could have a similar appearance. No bulky mediastinal, hilar or axillary lymphadenopathy. Lungs/Pleura: Minimal dependent subpleural ground-glass atelectasis. No discrete focal airspace opacities. No pleural effusion or pneumothorax. The central pulmonary airways appear widely patent. No discrete pulmonary nodules. Upper abdomen: Limited early arterial phase evaluation of the upper abdomen demonstrates mild thickening of the left adrenal gland without discrete nodule. Musculoskeletal: Regional soft tissues appear normal. There is a punctate (approximately 0.6 cm) hypoattenuating nodule within the inferior medial aspect the left lobe of the thyroid (image 23, series 6), of doubtful clinical concern. IMPRESSION: 1. Suspected circumferential esophageal wall thickening, potentially accentuated due to underdistention though potentially and esophagitis could have a similar appearance. Clinical correlation is advised. 2. Otherwise, no explanation for patient's centralized non radiating chest pain. Specifically, no evidence of pulmonary embolism. 3. Cardiomegaly with enlargement of the caliber the main pulmonary artery, nonspecific though could be seen in the setting of pulmonary arterial hypertension. Further  evaluation cardiac echo could be performed as indicated. 4. Aortic Atherosclerosis (ICD10-I70.0). Electronically Signed   By: JSandi MariscalM.D.   On: 05/28/2018 17:04   Ct Head Code Stroke Wo Contrast  Result Date: 06/06/2018 CLINICAL DATA:  Code stroke. Initial evaluation for acute altered mental status, right-sided weakness. EXAM: CT HEAD WITHOUT CONTRAST TECHNIQUE: Contiguous axial images were obtained from the base of the skull through the vertex without intravenous contrast. COMPARISON:  Prior CT from 07/16/2007. FINDINGS: Brain: Cerebral volume within normal limits for age. Patchy hypodensity within the periventricular and deep white matter both cerebral hemispheres, nonspecific, but most like related chronic small vessel ischemic disease. No acute intracranial hemorrhage. There is subtle loss of gray-white matter differentiation involving the anterior/mid left temporal lobe, suspicious for possible developing acute ischemic left MCA territory infarct (series 3, image 15 on axial view, series 6, image 52 on sagittal sequence. This corresponds with M2 region. Gray-white matter differentiation otherwise grossly maintained. No mass lesion, midline shift or mass effect. No hydrocephalus. No extra-axial fluid collection. Vascular: No hyperdense vessel. Scattered vascular calcifications noted within the carotid siphons. Skull: Scalp soft tissues and calvarium within normal limits. Postoperative changes from prior posterior decompression and laminectomy noted at the upper cervical spine with probable associated postoperative seroma. Sinuses/Orbits: Globes and orbital soft tissues within normal limits. Chronic mucosal thickening within the right maxillary sinus. Paranasal sinuses are otherwise largely clear. No mastoid effusion. Other: None. ASPECTS ([facility] Early CT Score) - Ganglionic level infarction (caudate, lentiform nuclei, internal capsule, insula, M1-M3 cortex): 6 - Supraganglionic infarction  (M4-M6 cortex): 3 Total score (0-10 with 10 being normal): 9 IMPRESSION: 1. Subtle hypodensity involving the anterior/mid left temporal lobe, suspicious for evolving acute ischemic left MCA territory infarct. No associated hemorrhage.  2. ASPECTS is 9. 3. Chronic microvascular ischemic disease. These results were communicated to Dr. Cheral Marker At 5:48 amon 9/6/2019by text page via the [facility] messaging system. Electronically Signed   By: Jeannine Boga M.D.   On: 06/06/2018 05:51      Phillips Climes M.D on 06/14/2018 at 3:55 PM  Between 7am to 7pm - Pager - [phone]  After 7pm go to [email or web address] - password [facility]  [facility] -  Office  [phone]

## 2018-06-14 NOTE — Progress Notes (Signed)
Progress Note  Patient Name: Ian Ramos Date of Encounter: 06/14/2018  Consulting Cardiologist: Dr. Fransico Him  Subjective   No active chest pain, was recently transferred out of the ICU.  Still has feeding tube in place.  Also cervical collar.  Inpatient Medications    Scheduled Meds: .  stroke: mapping our early stages of recovery book   Does not apply Once  . aspirin  162 mg Per Tube Daily  . atorvastatin  80 mg Per Tube q1800  . chlorhexidine gluconate (MEDLINE KIT)  15 mL Mouth Rinse BID  . Chlorhexidine Gluconate Cloth  6 each Topical Daily  . cloNIDine  0.1 mg Transdermal Weekly  . clopidogrel  75 mg Per Tube Daily  . enoxaparin (LOVENOX) injection  40 mg Subcutaneous Q24H  . insulin aspart  0-15 Units Subcutaneous Q4H  . iopamidol  100 mL Intravenous Once  . mouth rinse  15 mL Mouth Rinse BID  . sodium chloride flush  10-40 mL Intracatheter Q12H  . sodium chloride flush  3 mL Intravenous Q12H   Continuous Infusions: . sodium chloride 10 mL/hr at 06/09/18 1610  . sodium chloride 75 mL/hr at 06/13/18 0700  . famotidine (PEPCID) IV 20 mg (06/13/18 1709)  . feeding supplement (VITAL AF 1.2 CAL) 1,500 mL (06/13/18 1822)  . norepinephrine (LEVOPHED) Adult infusion Stopped (06/12/18 0046)   PRN Meds: sodium chloride, acetaminophen, fentaNYL (SUBLIMAZE) injection, RESOURCE THICKENUP CLEAR, sodium chloride flush, sodium chloride flush   Vital Signs    Vitals:   06/14/18 0041 06/14/18 0400 06/14/18 0850 06/14/18 1242  BP: (!) 104/54 122/60 (!) 112/54 135/61  Pulse: 78 74    Resp: 18 18    Temp: 98.2 F (36.8 C) 98 F (36.7 C) 98.6 F (37 C) 97.8 F (36.6 C)  TempSrc: Oral Oral Oral Oral  SpO2: 100% 100% 100% 100%  Weight:  76 kg    Height:        Intake/Output Summary (Last 24 hours) at 06/14/2018 1317 Last data filed at 06/14/2018 0500 Gross per 24 hour  Intake 135 ml  Output 1450 ml  Net -1315 ml   Filed Weights   06/12/18 0500 06/13/18 0500  06/14/18 0400  Weight: 73.2 kg 75.2 kg 76 kg    Telemetry    Sinus rhythm.  Personally reviewed.  ECG    Tracing from 06/13/2018 showed sinus rhythm with left bundle branch block.  Personally reviewed.  Physical Exam   GEN:  Bearded male.  No acute distress.   Neck:  Cervical soft collar. Cardiac: RRR, 2/6 systolic murmur, no gallop.  Respiratory: Nonlabored. Clear to auscultation bilaterally. GI: Soft, nontender, bowel sounds present. MS: No edema; No deformity.  Labs    Chemistry Recent Labs  Lab 06/12/18 0431 06/13/18 0435 06/14/18 0214  NA 136 139 137  K 4.1 3.7 3.8  CL 109 112* 108  CO2 20* 21* 22  GLUCOSE 257* 153* 133*  BUN 28* 34* 33*  CREATININE 1.38* 1.25* 1.18  CALCIUM 8.0* 7.6* 7.7*  GFRNONAA 49* 55* 59*  GFRAA 57* >60 >60  ANIONGAP 7 6 7      Hematology Recent Labs  Lab 06/12/18 0431 06/13/18 0435 06/14/18 0214  WBC 7.5 8.0 6.1  RBC 3.11* 2.93* 2.73*  HGB 9.1* 8.6* 8.1*  HCT 28.3* 26.2* 24.9*  MCV 91.0 89.4 91.2  MCH 29.3 29.4 29.7  MCHC 32.2 32.8 32.5  RDW 12.0 12.1 12.6  PLT 128* 160 165    Radiology  No results found.  Cardiac Studies   Cardiac catheterization 06/05/2018:  Prox RCA to Mid RCA lesion is 30% stenosed.  Acute Mrg lesion is 100% stenosed.  Prox LAD lesion is 75% stenosed.  Ost 2nd Mrg to 2nd Mrg lesion is 75% stenosed.  LV end diastolic pressure is normal.    1. Moderate obstructive CAD    - Eccentric 75% proximal LAD    - 75% OM2    - Occluded RV marginal branch with left to right collaterals. 2. Normal LVEDP  Plan: Patient does not have critical CAD. I would recommend medical management. If he has refractory angina despite optimal medical therapy would consider PCI of LAD. The OM lesion would be more difficult to treat since it involves bifurcation of a large OM1.   Echocardiogram 05/29/2018: Study Conclusions  - Left ventricle: The cavity size was normal. Systolic function was   moderately  reduced. The estimated ejection fraction was in the   range of 35% to 40%. Diffuse hypokinesis. Doppler parameters are   consistent with abnormal left ventricular relaxation (grade 1   diastolic dysfunction). Doppler parameters are consistent with   elevated ventricular end-diastolic filling pressure. - Aortic valve: A bicuspid morphology cannot be excluded; severely   thickened, severely calcified leaflets. There was trivial   regurgitation. - Mitral valve: There was mild regurgitation. - Left atrium: The atrium was normal in size. - Right atrium: The atrium was normal in size. - Tricuspid valve: There was trivial regurgitation. - Pulmonary arteries: Systolic pressure was within the normal   range. - Inferior vena cava: The vessel was dilated. The respirophasic   diameter changes were blunted (< 50%), consistent with elevated   central venous pressure. - Pericardium, extracardiac: There was no pericardial effusion.  Patient Profile     72 y.o. male with a history of CKD stage III, cervical disc disease status post recent cervical laminectomy in late August.  He presents with chest pain and early documented cardiomyopathy.  Cardiac catheterization revealed moderate CAD as outlined with initial plan for medical therapy.  Hospital course also complicated by stroke in the setting of high-grade ICA stenoses, hypoxic respiratory failure requiring mechanical ventilation.  Assessment & Plan    1.  Recently documented moderate CAD including an eccentric 75% proximal LAD stenosis, 75% OM 2 stenosis, and occluded RV marginal branch with left to right collaterals.  Initial plan is medical therapy.  He reports no active angina.  2.  Cardiomyopathy with LVEF 35 to 40% range and diffuse hypokinesis by echocardiogram.  3.  Acute on chronic renal insufficiency with improvement in creatinine down to 1.18 at this time.  4.  Severe ICA disease, has been evaluated by [facility] for possibility of carotid  stenting.  Dr. Oneida Alar note from September 10 indicated considering intervention possibly within a 4 to 6-week interval depending on clinical progress.  5.  Acute embolic respiratory failure now status post mechanical ventilation support, extubated and transferred out of ICU yesterday.  6.  Possible aspiration pneumonia.  7.  Essential hypertension.  Currently on clonidine patch.  8.  History of stroke, MRI shows left M1/M2 and right MCA/PCA watershed and right parietal punctate infarcts, consistent with hypoperfusion from right ICA high grade stenosis and left ICA occlusion.  Has been seen by Neurology.  Patient recently transferred out of ICU, no active angina at this time.  Current cardiac regimen includes aspirin, Lipitor, and clonidine patch.  Medications are being given per tube.  If swallowing assessment can  be accomplished, we can assist with transition to oral regimen and better solidify regimen for ischemic heart disease and cardiomyopathy.  Signed, Rozann Lesches, MD  06/14/2018, 1:17 PM

## 2018-06-14 NOTE — Progress Notes (Signed)
Spoke with Rosalia Hammers, RN primary nurse.  Aware of order to discontinue central line and will complete.

## 2018-06-15 DIAGNOSIS — I25110 Atherosclerotic heart disease of native coronary artery with unstable angina pectoris: Secondary | ICD-10-CM

## 2018-06-15 LAB — CBC
HEMATOCRIT: 27.3 % — AB (ref 39.0–52.0)
Hemoglobin: 8.9 g/dL — ABNORMAL LOW (ref 13.0–17.0)
MCH: 29.6 pg (ref 26.0–34.0)
MCHC: 32.6 g/dL (ref 30.0–36.0)
MCV: 90.7 fL (ref 78.0–100.0)
PLATELETS: 173 10*3/uL (ref 150–400)
RBC: 3.01 MIL/uL — AB (ref 4.22–5.81)
RDW: 12.4 % (ref 11.5–15.5)
WBC: 6.8 10*3/uL (ref 4.0–10.5)

## 2018-06-15 LAB — BASIC METABOLIC PANEL
ANION GAP: 7 (ref 5–15)
BUN: 28 mg/dL — ABNORMAL HIGH (ref 8–23)
CO2: 21 mmol/L — ABNORMAL LOW (ref 22–32)
Calcium: 7.6 mg/dL — ABNORMAL LOW (ref 8.9–10.3)
Chloride: 108 mmol/L (ref 98–111)
Creatinine, Ser: 1.22 mg/dL (ref 0.61–1.24)
GFR calc Af Amer: >60 mL/min (ref >60)
GFR, EST NON AFRICAN AMERICAN: 57 mL/min — AB (ref >60)
GLUCOSE: 146 mg/dL — AB (ref 70–99)
POTASSIUM: 4 mmol/L (ref 3.5–5.1)
Sodium: 136 mmol/L (ref 135–145)

## 2018-06-15 LAB — GLUCOSE, CAPILLARY
GLUCOSE-CAPILLARY: 158 mg/dL — AB (ref 70–99)
Glucose-Capillary: 119 mg/dL — ABNORMAL HIGH (ref 70–99)
Glucose-Capillary: 124 mg/dL — ABNORMAL HIGH (ref 70–99)
Glucose-Capillary: 128 mg/dL — ABNORMAL HIGH (ref 70–99)
Glucose-Capillary: 132 mg/dL — ABNORMAL HIGH (ref 70–99)
Glucose-Capillary: 142 mg/dL — ABNORMAL HIGH (ref 70–99)

## 2018-06-15 MED ORDER — CARVEDILOL 3.125 MG PO TABS
3.1250 mg | ORAL_TABLET | Freq: Two times a day (BID) | ORAL | Status: DC
  Administered 2018-06-15 – 2018-06-19 (×8): 3.125 mg via ORAL
  Filled 2018-06-15 (×8): qty 1

## 2018-06-15 NOTE — Progress Notes (Signed)
PROGRESS NOTE                                                                                                                                                                                                             Patient Demographics:    Ian Ramos, is a 72 y.o. male, DOB - [birth year], OMQ:[id number]  Admit date - 05/28/2018   Admitting Physician Vianne Bulls, MD  Outpatient Primary MD for the patient is Libby Maw, MD  LOS - 11   Chief Complaint  Patient presents with  . Chest Pain       Brief Narrative    72 year old male patient history of stage III chronic kidney disease, cervical spine stenosis status post recent cervical laminectomy August 2019.  Presented to the emergency room on 8/28 with chief complaint of chest pain, shoulder pain and substernal discomfort..  Was found to have some esophageal thickening, possibly some esophageal dysmotility, etiology was never clearly identified but resolved.  He was found to have new dilated cardiomyopathy with EF 35% and diffuse hypokinesis, and bundle branch block, but it was unclear as to whether or not if this was the primary cause of his discomfort.  The plan was to proceed with iliac ischemia evaluation.  He underwent cardiac catheterization on 9/5: Showed moderate obstructive coronary artery disease with 75% occluded proximal LAD 75% OM 2 occluded RV marginal branch with left to right collaterals.  This was not considered critical CAD and medical management was recommended. On 9/6 the patient developed acute mental status change.  On initial eval he was confused, preservation, and had right upper extremity drift and right visual field cut.  CT brain history left temporal lobe infarct felt possibly embolic in etiology.  Carotid ultrasound showed likely left carotid occlusion with high-grade right carotid stenosis.  Vascular surgery was consulted.  Plavix was recommended and delaying  carotid endarterectomy for further recovery was felt best course of action. 9/7: Continues to demonstrate aphasia.  Also with ongoing left upper extremity weakness.  MRI showed right MCA/PCA watershed and right parietal punctate infarcts felt more consistent with high-grade ICA stenosis on the left. 9/8: Patient vomited twice during the day while family was assisting with feeding 9/9: Found unresponsive in chair.  Blood pressure 48/32 heart rate 58 area not clear that he lost pulses.  Pulse  oximetry not recorded.  He was apneic, Code blue was called patient was intubated and transferred to the intensive care.  Consults: GI 8/29: Found asymptomatic esophagitis.  Also evidence of esophageal dysmotility on esophagram.  GI signed off on 8/30 with final recommendations for daily PPI x4 weeks and routine cancer screening Cardiology consulted 9/3 for chest pain.  Underwent left heart cath which showed diffuse coronary artery disease but noncritical recommended medical therapy, because he had bradycardia and CKD ARB was held he was recommended to continue hydralazine, nitrates and diuretics 9/7: Neurology consulted for stroke Vascular surgery consulted 9/6: For bilateral carotid artery disease: Started on Plavix.  Recommending gentle hydration.  Considering the patient for carotid stenting in the next 2 weeks    Subjective:    [name] today has,  No headache, No chest pain, No abdominal pain - No Nausea,   Assessment  & Plan :    Principal Problem:   Intractable pain Active Problems:   Cervical myelopathy (HCC)   Chest pain   CKD (chronic kidney disease), stage III (HCC)   Esophageal thickening   LBBB (left bundle branch block)   Postoperative anemia   DCM (dilated cardiomyopathy) (HCC)   Acute systolic CHF (congestive heart failure) ([place])   Coronary artery disease due to lipid rich plaque   Cerebrovascular accident (CVA) due to bilateral stenosis of carotid arteries ([place])   On  mechanically assisted ventilation ([place])   Acute respiratory failure ([place])   Essential hypertension   Acute encephalopathy   Acute hypoxemic respiratory failure -  requiring intubation and mechanical ventilation. Unclear at this time what the etiology of his respiratory arrest was. - extubated, significantly improving, encouraged to use incentive spirometry and flutter valve, currently on room air.  Acute bilateral CVA: -  Initially felt to be embolic, though with critical bilateral carotid stenosis and hypotension as well as watershed distribution of most infarcts, ICA stenosis is felt to be etiology. Locations include periventricular left M1/M2 distribution, right MCA/PCA distribution, and right temporoparietal cortex with resultant confusion and aphasia.   - On antiplatelet per neurology, statin - HbA1c 6.1%,  Bilateral carotid artery stenosis: Right ICA severely occluded (80-99%) and left ICA said to be completely occluded.  - Ideally 130-172mHg SBP  - Vascular surgery consulted.Dr. FOneida Alarnote from September 10 indicated considering intervention possibly within a 4 to 6-week interval depending on clinical progress. - ASA, plavix, statin  -Please see discussion below regarding carotid artery stenosis -Continue with PT/OT, TIR consulted  Cardiomyopathy with LVEF 35 to 40% range and diffuse hypokinesis by echocardiogram. -Management per cardiology, with known history of CAD, left cardiac cath 06/05/2018 significant for eccentric 75% proximal LAD stenosis, 75% OM 2 stenosis, and occluded RV marginal branch with left-to-right collaterals.  Plan is medical therapy at per cardiology, noted on beta-blockers, already on clonidine patch, on aspirin, Plavix and statin, no ACE or ARB at is recovering from renal failure.  Cervical myelopathy status post laminectomy -By PT/OT, recommendation for CIR -Wound looks clean  Anemia of chronic disease -Need to monitor and transfuse as needed  Acute  on chronic renal insufficiency -Improving.  Acute encephalopathy, acute delirium: -Multifactorial, in the setting of CVA, recent surgery, ICU delirium and stay, prolonged hospitalization, appears to be improving. -Diet has been advanced by SLP, but he remains with poor appetite, so I have held his tube feeding today to improve his appetite.  Urinary retention. - Will attempt voiding trial today  Hypertension -Continue with clonidine patch, he was  started on low-dose Coreg today as well  Code Status : Full  Family Communication  : Daughter at bedside  Disposition Plan  : CIR   Consults  : Cardiology, neurology, PCCM vascular surgery  Procedures  :  Left heart cath 9/5 showing moderate noncritical coronary artery disease.  75% occluded proximal LAD 75% occluded OM.  Medical management was recommended  Significant Diagnostic Tests: Echocardiogram 8/29:EF 35 to 40%.  Diffuse hypokinesis.  Doppler parameters consistent with grade 1 diastolic dysfunction. 9/6: MRI brainExtensive motion artifact.  Minimal flow related signal in the left cervical ICA and absent flow of the left ICA representing occlusion or proximal high-grade stenosis.  There is a large anterior communicating artery providing collateral circulation to the left MCA and left PCA. 9/6: CT brain: Subtle hypodensity in the anterior mid left temporal lobe 9/4: Nuclear stress test EF 39% findings were consistent with ischemia felt to be intermittent risk 8/30: Esophagram showing moderate esophageal dysmotility 9/12 extubated, confused  DVT Prophylaxis  :  lovenox  Lab Results  Component Value Date   PLT 173 06/15/2018    Antibiotics  :    Anti-infectives (From admission, onward)   None        Objective:   Vitals:   06/14/18 2000 06/15/18 0509 06/15/18 0800 06/15/18 1350  BP: (!) 123/103 (!) 149/66 131/66 115/64  Pulse: 83 86 88 84  Resp: 19 (!) 22  (!) 21  Temp: 99.1 F (37.3 C) 98.9 F (37.2 C) 98.9 F  (37.2 C) 98.3 F (36.8 C)  TempSrc: Oral Oral Oral Oral  SpO2: 100% 100% 98%   Weight:  76.4 kg    Height:        Wt Readings from Last 3 Encounters:  06/15/18 76.4 kg  05/23/18 79.4 kg  05/16/18 79.4 kg     Intake/Output Summary (Last 24 hours) at 06/15/2018 1452 Last data filed at 06/15/2018 1427 Gross per 24 hour  Intake 2592.83 ml  Output 2000 ml  Net 592.83 ml     Physical Exam  Awake Alert, confused today, more appropriate Symmetrical Chest wall movement, Good air movement bilaterally, CTAB RRR,No Gallops,Rubs or new Murmurs, No Parasternal Heave +ve B.Sounds, Abd Soft, No tenderness, No rebound - guarding or rigidity. No Cyanosis, Clubbing or edema, No new Rash or bruise        Data Review:    CBC Recent Labs  Lab 06/10/18 0327 06/11/18 0302 06/12/18 0431 06/13/18 0435 06/14/18 0214 06/15/18 0719  WBC 18.5* 11.1* 7.5 8.0 6.1 6.8  HGB 11.0* 10.0* 9.1* 8.6* 8.1* 8.9*  HCT 33.7* 30.5* 28.3* 26.2* 24.9* 27.3*  PLT 195 146* 128* 160 165 173  MCV 90.8 91.0 91.0 89.4 91.2 90.7  MCH 29.6 29.9 29.3 29.4 29.7 29.6  MCHC 32.6 32.8 32.2 32.8 32.5 32.6  RDW 12.1 12.0 12.0 12.1 12.6 12.4  LYMPHSABS 0.8 1.1  --   --   --   --   MONOABS 1.5* 0.9  --   --   --   --   EOSABS 0.1 0.1  --   --   --   --   BASOSABS 0.0 0.0  --   --   --   --     Chemistries  Recent Labs  Lab 06/11/18 0302 06/11/18 1813 06/12/18 0431 06/12/18 1648 06/13/18 0435 06/14/18 0214 06/15/18 0719  NA 134*  --  136  --  139 137 136  K 3.8  --  4.1  --  3.7 3.8 4.0  CL 106  --  109  --  112* 108 108  CO2 17*  --  20*  --  21* 22 21*  GLUCOSE 115*  --  257*  --  153* 133* 146*  BUN 29*  --  28*  --  34* 33* 28*  CREATININE 1.56*  --  1.38*  --  1.25* 1.18 1.22  CALCIUM 8.1*  --  8.0*  --  7.6* 7.7* 7.6*  MG  --  1.6* 1.7 2.0 2.1 1.6*  --    ------------------------------------------------------------------------------------------------------------------ No results for input(s):  CHOL, HDL, LDLCALC, TRIG, CHOLHDL, LDLDIRECT in the last 72 hours.  Lab Results  Component Value Date   HGBA1C 6.1 (H) 06/06/2018   ------------------------------------------------------------------------------------------------------------------ No results for input(s): TSH, T4TOTAL, T3FREE, THYROIDAB in the last 72 hours.  Invalid input(s): FREET3 ------------------------------------------------------------------------------------------------------------------ No results for input(s): VITAMINB12, FOLATE, FERRITIN, TIBC, IRON, RETICCTPCT in the last 72 hours.  Coagulation profile No results for input(s): INR, PROTIME in the last 168 hours.  No results for input(s): DDIMER in the last 72 hours.  Cardiac Enzymes No results for input(s): CKMB, TROPONINI, MYOGLOBIN in the last 168 hours.  Invalid input(s): CK ------------------------------------------------------------------------------------------------------------------ No results found for: BNP  Inpatient Medications  Scheduled Meds: .  stroke: mapping our early stages of recovery book   Does not apply Once  . aspirin  162 mg Per Tube Daily  . atorvastatin  80 mg Per Tube q1800  . carvedilol  3.125 mg Oral BID WC  . chlorhexidine gluconate (MEDLINE KIT)  15 mL Mouth Rinse BID  . cloNIDine  0.1 mg Transdermal Weekly  . clopidogrel  75 mg Per Tube Daily  . enoxaparin (LOVENOX) injection  40 mg Subcutaneous Q24H  . insulin aspart  0-15 Units Subcutaneous Q4H  . iopamidol  100 mL Intravenous Once  . mouth rinse  15 mL Mouth Rinse BID  . sodium chloride flush  3 mL Intravenous Q12H   Continuous Infusions: . sodium chloride 10 mL/hr at 06/09/18 1610  . sodium chloride 75 mL/hr at 06/15/18 0700  . famotidine (PEPCID) IV Stopped (06/14/18 1707)  . feeding supplement (VITAL AF 1.2 CAL) Stopped (06/15/18 1051)  . norepinephrine (LEVOPHED) Adult infusion Stopped (06/12/18 0046)   PRN Meds:.sodium chloride, acetaminophen, fentaNYL  (SUBLIMAZE) injection, RESOURCE THICKENUP CLEAR, sodium chloride flush  Micro Results Recent Results (from the past 240 hour(s))  Culture, respiratory (non-expectorated)     Status: None   Collection Time: 06/09/18  4:48 PM  Result Value Ref Range Status   Specimen Description TRACHEAL ASPIRATE  Final   Special Requests NONE  Final   Gram Stain   Final    RARE WBC PRESENT, PREDOMINANTLY PMN FEW GRAM POSITIVE COCCI FEW GRAM POSITIVE RODS    Culture   Final    Consistent with normal respiratory flora. Performed at [facility], [place] [address] [place], [place] [address]    Report Status 06/11/2018 FINAL  Final    Radiology Reports Dg Chest 2 View  Result Date: 05/28/2018 CLINICAL DATA:  Chest pain and weakness for a few days. The patient underwent cervical fusion 05/23/2018. EXAM: CHEST - 2 VIEW COMPARISON:  PA and lateral chest 05/26/2018. FINDINGS: Lung volumes are low with minimal atelectasis in the left base. No pneumothorax or pleural effusion. Heart size is normal. No acute or focal bony abnormality. Degenerative disease about the shoulders noted. IMPRESSION: No acute disease. Electronically Signed   By: Inge Rise M.D.  On: 05/28/2018 11:57   Dg Chest 2 View  Result Date: 05/26/2018 CLINICAL DATA:  72 year old male with weakness and cough. Status post recent cervical spine surgery. EXAM: CHEST - 2 VIEW COMPARISON:  None. FINDINGS: Cardiac and mediastinal contours are within normal limits. Atherosclerotic calcifications are present within the transverse aorta. Mild linear opacity in the lingula most consistent with atelectasis. No evidence of pulmonary edema, pleural effusion or pneumothorax. No suspicious pulmonary nodule or mass. Degenerative changes are present in both shoulders including the acromioclavicular joints and glenohumeral joints. High-riding humeral heads bilaterally with decreased subacromial space consistent with chronic rotator cuff injuries. No acute  osseous abnormality. IMPRESSION: 1. Linear opacity in the lingula most consistent with atelectasis. 2.  Aortic Atherosclerosis (ICD10-170.0) 3. Bilateral degenerative changes in the glenohumeral and acromioclavicular joints. Electronically Signed   By: Jacqulynn Cadet M.D.   On: 05/26/2018 08:01   Dg Cervical Spine 2-3 Views  Result Date: 05/23/2018 CLINICAL DATA:  C1-C6 decompression and fusion. EXAM: CERVICAL SPINE - 2-3 VIEW; DG C-ARM 61-120 MIN COMPARISON:  MRI 01/01/2018 FINDINGS: C-arm images show posterior fusion with lateral mass screws and posterior rods from C1 through C6. No radiographically detectable complication on this limited imaging. IMPRESSION: Posterior fusion from C1 through C6. Electronically Signed   By: Nelson Chimes M.D.   On: 05/23/2018 16:26   Ct Cervical Spine Wo Contrast  Result Date: 05/28/2018 CLINICAL DATA:  Chest pain.  Recent cervical spine surgery. EXAM: CT CERVICAL SPINE WITHOUT CONTRAST TECHNIQUE: Multidetector CT imaging of the cervical spine was performed without intravenous contrast. Multiplanar CT image reconstructions were also generated. COMPARISON:  MRI cervical spine January 01, 2018 FINDINGS: ALIGNMENT: Straightened cervical lordosis.  No malalignment. SKULL BASE AND VERTEBRAE: Vertebral bodies intact. Interval C1 through C6 laminectomies. Intact well-seated lateral mass screws. The RIGHT C3, C4, C5 and C6 screws are intra-articular. The LEFT C3 through C6 facet screws are intra-articular, the LEFT facet screw extends into the foramen transversarium. The LEFT C6 screw extends into the cephalad aspect of the foramen transversarium. Intact hardware. Non incorporated posterior bone graft material. Stable moderate to severe disc height loss C3-4 through T1-2 with endplate sclerosis and marginal spurring compatible with degenerative discs. Stable geographic subcentimeter cyst and posterior odontoid process without cortical disruption. 1 cm calcified pannus posterior to  the odontoid process seen with CPPD without destructive bony changes. SOFT TISSUES AND SPINAL CANAL: And gas within the surgical bed and ventral epidural space at C1-2 consistent with recent surgery. Small prevertebral effusion is likely postoperative. Mild calcific atherosclerosis aortic arch. DISC LEVELS: Canal obscured by hardware streak artifact. Neural foraminal narrowing all cervical levels varying from moderate to severe. UPPER CHEST: Lung apices are clear. OTHER: None. IMPRESSION: 1. Status post recent C1 through C6 laminectomies and posterior instrumentation. Multilevel intra-articular facet screws, LEFT C2 screw in foramen transversarium. 2. Postoperative fluid collection and gas consistent with recent surgery/seroma, pseudomeningocele not excluded. If there is clinical concern for infection, consider sampling. 3. No fracture deformity or malalignment. Aortic Atherosclerosis (ICD10-I70.0). Electronically Signed   By: Elon Alas M.D.   On: 05/28/2018 16:32   [name]  Result Date: 06/06/2018 CLINICAL DATA:  72 y/o M; acute altered mental status and right-sided weakness. Stroke evaluation. EXAM: MRI HEAD WITHOUT CONTRAST MRA HEAD WITHOUT CONTRAST TECHNIQUE: Axial DWI, coronal DWI, axial T2 FLAIR sequences of the head were acquired. Angiographic images of the head were obtained using MRA technique without contrast. The patient was unable to continue and additional  sequences were not acquired. COMPARISON:  06/06/2018 CT head.  05/28/2018 CT cervical spine. FINDINGS: MRI HEAD FINDINGS Brain: Subcentimeter foci of reduced diffusion compatible with acute/early subacute infarction are present, 2 in the left superior frontal lobe, and 2 in the right posterior lateral parietal lobe. No signal abnormality in the left anterior temporal lobe, findings on CT likely related to artifact. Several nonspecific T2 FLAIR hyperintensities in subcortical and periventricular white matter are compatible with  mild chronic microvascular ischemic changes for age. Mild volume loss of the brain. No hydrocephalus, extra-axial collection, or herniation. Vascular: As below. Skull and upper cervical spine: Partially visualized cervical spinal fusion and laminectomy. Large fluid collection within the postoperative bed as seen on prior CT of the cervical spine. No reduced diffusion of the visible portions of the fluid collection to indicate infection. Sinuses/Orbits: Negative. Other: None. MRA HEAD FINDINGS Motion degraded study. Anterior circulation: Minimal flow related signal within the left cervical internal carotid artery and no appreciable flow related signal of the left petrous and cavernous segments of the left internal carotid artery. Faint flow related signal within the terminal left ICA, probably retrograde. Right internal carotid artery is patent, moderate to severe stenosis of the right cavernous segment. Normal flow related signal within the right MCA and bilateral ACA distributions. Large anterior communicating artery largely perfusing the left MCA distribution which is patent. Asymmetry in the MCA distribution signal, left less than right, is likely due to signal loss from delayed transit via A-comm collateralization. Posterior circulation: Faint flow related signal in the left posterior cerebral artery which appears to be a fetal PCA is likely due to signal loss from delayed transit due to A-comm collateralization from the right ICA. Normal flow related signal within the vertebral arteries, basilar artery, and right PCA. IMPRESSION: MRI head: 1. Diffusion and T2 FLAIR weighted sequences were acquired. The patient was unable to continue and additional sequences were not acquired. 2. Subcentimeter acute/early subacute infarctions, 2 in the left superior frontal lobe, and 2 in the right posterolateral parietal lobe. No associated mass effect. 3. Mild chronic microvascular ischemic changes and volume loss of the  brain. MRA head: 1. Extensive motion artifact. 2. Minimal flow related signal in the left cervical ICA and absent flow related signal within the petrous and cavernous segments of left ICA. Findings may represent occlusion or proximal high-grade stenosis. Age indeterminate. 3. Large anterior communicating artery providing collateral circulation to the left MCA and left PCA distributions. Decreased signal in left MCA and left PCA distribution from increased transit time. CTA of the head and neck can better assess vessel stenosis and patency. If clinically indicated consider CT perfusion of the head to assess for brain oligemia. These results will be called to the ordering clinician or representative by the Radiologist Assistant, and communication documented in the PACS or zVision Dashboard. Electronically Signed   By: Kristine Garbe M.D.   On: 06/06/2018 18:14   Dg Esophagus  Result Date: 05/30/2018 CLINICAL DATA:  Esophageal wall thickening on CT EXAM: ESOPHOGRAM/BARIUM SWALLOW TECHNIQUE: Single contrast examination was performed using  thin barium. FLUOROSCOPY TIME:  Fluoroscopy Time:  42 seconds Radiation Exposure Index (if provided by the fluoroscopic device): 12.5 mGy Number of Acquired Spot Images: 8 COMPARISON:  CTA chest dated 05/28/2018 FINDINGS: Limited evaluation due to difficulty with patient positioning and inability to perform double-contrast evaluation. Single contrast evaluation in the recumbent supine position was performed. No mucosal irregularity to suggest esophageal mass. No fixed esophageal narrowing or stricture. Moderate  esophageal dysmotility. No evidence of hiatal hernia. Visualized stomach is grossly unremarkable. IMPRESSION: Moderate esophageal dysmotility. Otherwise negative single-contrast esophagram. Electronically Signed   By: Julian Hy M.D.   On: 05/30/2018 09:30   Nm Myocar Multi W/spect W/wall Motion / Ef  Result Date: 06/04/2018  There was no ST segment  deviation noted during stress.  Defect 1: There is a medium defect of moderate severity present in the basal anteroseptal and mid anteroseptal location.  Defect 2: There is a medium defect of severe severity present in the basal inferior and mid inferior location.  This is an intermediate risk study.  Findings consistent with ischemia.  Nuclear stress EF: 39%.  The left ventricular ejection fraction is moderately decreased (30-44%).  Abnormal, intermediate risk stress nuclear study with predominantly fixed anteroseptal defect possibly secondary to left bundle branch block; mild to moderate inferior ischemia; gated ejection fraction 39% with hypokinesis of the septum, mild left ventricular enlargement.   Dg Chest Port 1 View  Result Date: 06/12/2018 CLINICAL DATA:  Respiratory failure EXAM: PORTABLE CHEST 1 VIEW COMPARISON:  06/09/2018 FINDINGS: Endotracheal tube and left subclavian central line are again noted and stable. Feeding catheter has been placed and courses towards the stomach. Previously seen nasogastric catheter has been removed. Postsurgical changes in the cervical spine are noted. Cardiac shadow is stable. Lungs are well aerated bilaterally without focal infiltrate or sizable effusion. IMPRESSION: Tubes and lines as described above. No acute abnormality noted. Electronically Signed   By: Inez Catalina M.D.   On: 06/12/2018 09:59   Dg Chest Port 1 View  Result Date: 06/09/2018 CLINICAL DATA:  Central line placement confirmation EXAM: PORTABLE CHEST 1 VIEW COMPARISON:  06/09/2018 1645 hours FINDINGS: Endotracheal and NG tubes stable. Left subclavian central venous catheter placed. Tip is in the mid SVC. There is no ensuing left pneumothorax. Normal heart size. Clear and under aerated lungs. IMPRESSION: Left subclavian central venous catheter placement with its tip in the mid SVC and no pneumothorax per Clear lungs. Stable endotracheal and NG tubes. Electronically Signed   By: Marybelle Killings M.D.    On: 06/09/2018 18:09   Dg Chest Port 1 View  Result Date: 06/09/2018 CLINICAL DATA:  72 year old male with acute respiratory failure. Subsequent encounter. EXAM: PORTABLE CHEST 1 VIEW COMPARISON:  05/28/2018 CT and chest x-ray. FINDINGS: Endotracheal tube tip centrally located 4.9 cm above the carina. Nasogastric tube tip gastric fundus level. No infiltrate or congestive heart failure. No obvious pneumothorax. Heart size within normal limits. Calcified mildly tortuous aorta. IMPRESSION: 1. No infiltrate or congestive heart failure. 2.  Aortic Atherosclerosis (ICD10-I70.0). Electronically Signed   By: Genia Del M.D.   On: 06/09/2018 16:57   Dg Abd Portable 1v  Result Date: 06/11/2018 CLINICAL DATA:  Feeding tube placement EXAM: PORTABLE ABDOMEN - 1 VIEW COMPARISON:  06/10/2018 FINDINGS: Feeding tube has been placed with the tip in the distal descending duodenum. Nonobstructive bowel gas pattern. IMPRESSION: Feeding tube in the distal descending duodenum. Electronically Signed   By: Rolm Baptise M.D.   On: 06/11/2018 11:12   Dg Abd Portable 1v  Result Date: 06/10/2018 CLINICAL DATA:  Status post OG tube placement. EXAM: PORTABLE ABDOMEN - 1 VIEW COMPARISON:  None. FINDINGS: OG tube is in place with the tip just within the stomach. Side port is in the distal esophagus. Recommend advancement of 5-6 cm. IMPRESSION: As above. Electronically Signed   By: Inge Rise M.D.   On: 06/10/2018 10:27   Dg C-arm 1-60  Min  Result Date: 05/23/2018 CLINICAL DATA:  C1-C6 decompression and fusion. EXAM: CERVICAL SPINE - 2-3 VIEW; DG C-ARM 61-120 MIN COMPARISON:  MRI 01/01/2018 FINDINGS: C-arm images show posterior fusion with lateral mass screws and posterior rods from C1 through C6. No radiographically detectable complication on this limited imaging. IMPRESSION: Posterior fusion from C1 through C6. Electronically Signed   By: Nelson Chimes M.D.   On: 05/23/2018 16:26   Mr Jodene Nam Head Wo Contrast  Result Date:  06/06/2018 CLINICAL DATA:  72 y/o M; acute altered mental status and right-sided weakness. Stroke evaluation. EXAM: MRI HEAD WITHOUT CONTRAST MRA HEAD WITHOUT CONTRAST TECHNIQUE: Axial DWI, coronal DWI, axial T2 FLAIR sequences of the head were acquired. Angiographic images of the head were obtained using MRA technique without contrast. The patient was unable to continue and additional sequences were not acquired. COMPARISON:  06/06/2018 CT head.  05/28/2018 CT cervical spine. FINDINGS: MRI HEAD FINDINGS Brain: Subcentimeter foci of reduced diffusion compatible with acute/early subacute infarction are present, 2 in the left superior frontal lobe, and 2 in the right posterior lateral parietal lobe. No signal abnormality in the left anterior temporal lobe, findings on CT likely related to artifact. Several nonspecific T2 FLAIR hyperintensities in subcortical and periventricular white matter are compatible with mild chronic microvascular ischemic changes for age. Mild volume loss of the brain. No hydrocephalus, extra-axial collection, or herniation. Vascular: As below. Skull and upper cervical spine: Partially visualized cervical spinal fusion and laminectomy. Large fluid collection within the postoperative bed as seen on prior CT of the cervical spine. No reduced diffusion of the visible portions of the fluid collection to indicate infection. Sinuses/Orbits: Negative. Other: None. MRA HEAD FINDINGS Motion degraded study. Anterior circulation: Minimal flow related signal within the left cervical internal carotid artery and no appreciable flow related signal of the left petrous and cavernous segments of the left internal carotid artery. Faint flow related signal within the terminal left ICA, probably retrograde. Right internal carotid artery is patent, moderate to severe stenosis of the right cavernous segment. Normal flow related signal within the right MCA and bilateral ACA distributions. Large anterior communicating  artery largely perfusing the left MCA distribution which is patent. Asymmetry in the MCA distribution signal, left less than right, is likely due to signal loss from delayed transit via A-comm collateralization. Posterior circulation: Faint flow related signal in the left posterior cerebral artery which appears to be a fetal PCA is likely due to signal loss from delayed transit due to A-comm collateralization from the right ICA. Normal flow related signal within the vertebral arteries, basilar artery, and right PCA. IMPRESSION: MRI head: 1. Diffusion and T2 FLAIR weighted sequences were acquired. The patient was unable to continue and additional sequences were not acquired. 2. Subcentimeter acute/early subacute infarctions, 2 in the left superior frontal lobe, and 2 in the right posterolateral parietal lobe. No associated mass effect. 3. Mild chronic microvascular ischemic changes and volume loss of the brain. MRA head: 1. Extensive motion artifact. 2. Minimal flow related signal in the left cervical ICA and absent flow related signal within the petrous and cavernous segments of left ICA. Findings may represent occlusion or proximal high-grade stenosis. Age indeterminate. 3. Large anterior communicating artery providing collateral circulation to the left MCA and left PCA distributions. Decreased signal in left MCA and left PCA distribution from increased transit time. CTA of the head and neck can better assess vessel stenosis and patency. If clinically indicated consider CT perfusion of the head to  assess for brain oligemia. These results will be called to the ordering clinician or representative by the Radiologist Assistant, and communication documented in the PACS or zVision Dashboard. Electronically Signed   By: Kristine Garbe M.D.   On: 06/06/2018 18:14   Ct Angio Chest Aorta W And/or Wo Contrast  Result Date: 05/28/2018 CLINICAL DATA:  Centralized non radiating chest pain beginning last evening.  Recent spinal surgery. Evaluate for pulmonary embolism. EXAM: CT ANGIOGRAPHY CHEST WITH CONTRAST TECHNIQUE: Multidetector CT imaging of the chest was performed using the standard protocol during bolus administration of intravenous contrast. Multiplanar CT image reconstructions and MIPs were obtained to evaluate the vascular anatomy. CONTRAST:  12m ISOVUE-370 IOPAMIDOL (ISOVUE-370) INJECTION 76% COMPARISON:  Chest radiograph - 05/28/2018 FINDINGS: Vascular Findings: There is adequate opacification of the pulmonary arterial system with the main pulmonary artery measuring 1,251 Hounsfield units. There are no discrete filling defects within the pulmonary arterial tree to suggest pulmonary embolism. Enlarged caliber of the main pulmonary artery measuring 35 mm in diameter. Cardiomegaly.  No pericardial effusion. Minimal amount of atherosclerotic plaque within a normal caliber thoracic aorta. Conventional configuration of the aortic arch. The branch vessels of the aortic arch appear patent throughout their imaged course. Review of the MIP images confirms the above findings. ---------------------------------------------------------------------------------- Nonvascular Findings: Mediastinum/Lymph Nodes: Potential mild circumferential wall thickening of the esophagus (representative image 46, series 6), potentially accentuated due to underdistention though conceivably esophagitis could have a similar appearance. No bulky mediastinal, hilar or axillary lymphadenopathy. Lungs/Pleura: Minimal dependent subpleural ground-glass atelectasis. No discrete focal airspace opacities. No pleural effusion or pneumothorax. The central pulmonary airways appear widely patent. No discrete pulmonary nodules. Upper abdomen: Limited early arterial phase evaluation of the upper abdomen demonstrates mild thickening of the left adrenal gland without discrete nodule. Musculoskeletal: Regional soft tissues appear normal. There is a punctate  (approximately 0.6 cm) hypoattenuating nodule within the inferior medial aspect the left lobe of the thyroid (image 23, series 6), of doubtful clinical concern. IMPRESSION: 1. Suspected circumferential esophageal wall thickening, potentially accentuated due to underdistention though potentially and esophagitis could have a similar appearance. Clinical correlation is advised. 2. Otherwise, no explanation for patient's centralized non radiating chest pain. Specifically, no evidence of pulmonary embolism. 3. Cardiomegaly with enlargement of the caliber the main pulmonary artery, nonspecific though could be seen in the setting of pulmonary arterial hypertension. Further evaluation cardiac echo could be performed as indicated. 4. Aortic Atherosclerosis (ICD10-I70.0). Electronically Signed   By: JSandi MariscalM.D.   On: 05/28/2018 17:04   Ct Head Code Stroke Wo Contrast  Result Date: 06/06/2018 CLINICAL DATA:  Code stroke. Initial evaluation for acute altered mental status, right-sided weakness. EXAM: CT HEAD WITHOUT CONTRAST TECHNIQUE: Contiguous axial images were obtained from the base of the skull through the vertex without intravenous contrast. COMPARISON:  Prior CT from 07/16/2007. FINDINGS: Brain: Cerebral volume within normal limits for age. Patchy hypodensity within the periventricular and deep white matter both cerebral hemispheres, nonspecific, but most like related chronic small vessel ischemic disease. No acute intracranial hemorrhage. There is subtle loss of gray-white matter differentiation involving the anterior/mid left temporal lobe, suspicious for possible developing acute ischemic left MCA territory infarct (series 3, image 15 on axial view, series 6, image 52 on sagittal sequence. This corresponds with M2 region. Gray-white matter differentiation otherwise grossly maintained. No mass lesion, midline shift or mass effect. No hydrocephalus. No extra-axial fluid collection. Vascular: No hyperdense  vessel. Scattered vascular calcifications noted within the carotid siphons. Skull:  Scalp soft tissues and calvarium within normal limits. Postoperative changes from prior posterior decompression and laminectomy noted at the upper cervical spine with probable associated postoperative seroma. Sinuses/Orbits: Globes and orbital soft tissues within normal limits. Chronic mucosal thickening within the right maxillary sinus. Paranasal sinuses are otherwise largely clear. No mastoid effusion. Other: None. ASPECTS [facility] Stroke Program Early CT Score) - Ganglionic level infarction (caudate, lentiform nuclei, internal capsule, insula, M1-M3 cortex): 6 - Supraganglionic infarction (M4-M6 cortex): 3 Total score (0-10 with 10 being normal): 9 IMPRESSION: 1. Subtle hypodensity involving the anterior/mid left temporal lobe, suspicious for evolving acute ischemic left MCA territory infarct. No associated hemorrhage. 2. ASPECTS is 9. 3. Chronic microvascular ischemic disease. These results were communicated to Dr. Cheral Marker At 5:48 amon 9/6/2019by text page via the [facility] messaging system. Electronically Signed   By: Jeannine Boga M.D.   On: 06/06/2018 05:51      Phillips Climes M.D on 06/15/2018 at 2:52 PM  Between 7am to 7pm - Pager - [phone]  After 7pm go to [email or web address] - password [facility]  [facility] -  Office  [phone]

## 2018-06-15 NOTE — Progress Notes (Signed)
Progress Note  Patient Name: Ian Ramos Date of Encounter: 06/15/2018  Primary Cardiologist: Dr. Fransico Him  Subjective   Does not report chest pain or breathlessness.  Appetite is not very good.   Inpatient Medications    Scheduled Meds: .  stroke: mapping our early stages of recovery book   Does not apply Once  . aspirin  162 mg Per Tube Daily  . atorvastatin  80 mg Per Tube q1800  . chlorhexidine gluconate (MEDLINE KIT)  15 mL Mouth Rinse BID  . cloNIDine  0.1 mg Transdermal Weekly  . clopidogrel  75 mg Per Tube Daily  . enoxaparin (LOVENOX) injection  40 mg Subcutaneous Q24H  . insulin aspart  0-15 Units Subcutaneous Q4H  . iopamidol  100 mL Intravenous Once  . mouth rinse  15 mL Mouth Rinse BID  . sodium chloride flush  3 mL Intravenous Q12H   Continuous Infusions: . sodium chloride 10 mL/hr at 06/09/18 1610  . sodium chloride 75 mL/hr at 06/15/18 0700  . famotidine (PEPCID) IV Stopped (06/14/18 1707)  . feeding supplement (VITAL AF 1.2 CAL) Stopped (06/15/18 1051)  . norepinephrine (LEVOPHED) Adult infusion Stopped (06/12/18 0046)   PRN Meds: sodium chloride, acetaminophen, fentaNYL (SUBLIMAZE) injection, RESOURCE THICKENUP CLEAR, sodium chloride flush   Vital Signs    Vitals:   06/14/18 1647 06/14/18 2000 06/15/18 0509 06/15/18 0800  BP: (!) 128/59 (!) 123/103 (!) 149/66 131/66  Pulse: 77 83 86 88  Resp:  19 (!) 22   Temp: 98.9 F (37.2 C) 99.1 F (37.3 C) 98.9 F (37.2 C) 98.9 F (37.2 C)  TempSrc: Oral Oral Oral Oral  SpO2: 97% 100% 100% 98%  Weight:   76.4 kg   Height:        Intake/Output Summary (Last 24 hours) at 06/15/2018 1145 Last data filed at 06/15/2018 0423 Gross per 24 hour  Intake 2592.83 ml  Output 1100 ml  Net 1492.83 ml   Filed Weights   06/13/18 0500 06/14/18 0400 06/15/18 0509  Weight: 75.2 kg 76 kg 76.4 kg    Telemetry    Sinus rhythm.  Personally reviewed.  ECG    Tracing from 06/13/2018 showed sinus rhythm  with left bundle branch block.  Personally reviewed.  Physical Exam   GEN:  Bearded male.  No acute distress.  Nasopharyngeal feeding tube in place. Neck:  Soft cervical collar. Cardiac: RRR, 2/6 systolic murmur, no gallop. Respiratory: Nonlabored. Clear to auscultation bilaterally. GI: Soft, nontender, bowel sounds present. MS: No edema; No deformity. Psych: Alert and oriented x 3. Normal affect.  Labs    Chemistry Recent Labs  Lab 06/13/18 0435 06/14/18 0214 06/15/18 0719  NA 139 137 136  K 3.7 3.8 4.0  CL 112* 108 108  CO2 21* 22 21*  GLUCOSE 153* 133* 146*  BUN 34* 33* 28*  CREATININE 1.25* 1.18 1.22  CALCIUM 7.6* 7.7* 7.6*  GFRNONAA 55* 59* 57*  GFRAA >60 >60 >60  ANIONGAP 6 7 7      Hematology Recent Labs  Lab 06/13/18 0435 06/14/18 0214 06/15/18 0719  WBC 8.0 6.1 6.8  RBC 2.93* 2.73* 3.01*  HGB 8.6* 8.1* 8.9*  HCT 26.2* 24.9* 27.3*  MCV 89.4 91.2 90.7  MCH 29.4 29.7 29.6  MCHC 32.8 32.5 32.6  RDW 12.1 12.6 12.4  PLT 160 165 173    Radiology    No results found.  Cardiac Studies   Cardiac catheterization 06/05/2018:  Prox RCA to Mid RCA lesion  is 30% stenosed.  Acute Mrg lesion is 100% stenosed.  Prox LAD lesion is 75% stenosed.  Ost 2nd Mrg to 2nd Mrg lesion is 75% stenosed.  LV end diastolic pressure is normal.   1. Moderate obstructive CAD - Eccentric 75% proximal LAD - 75% OM2 - Occluded RV marginal branch with left to right collaterals. 2. Normal LVEDP  Plan: Patient does not have critical CAD. I would recommend medical management. If he has refractory angina despite optimal medical therapy would consider PCI of LAD. The OM lesion would be more difficult to treat since it involves bifurcation of a large OM1.   Echocardiogram 05/29/2018: Study Conclusions  - Left ventricle: The cavity size was normal. Systolic function was moderately reduced. The estimated ejection fraction was in the range of 35% to 40%.  Diffuse hypokinesis. Doppler parameters are consistent with abnormal left ventricular relaxation (grade 1 diastolic dysfunction). Doppler parameters are consistent with elevated ventricular end-diastolic filling pressure. - Aortic valve: A bicuspid morphology cannot be excluded; severely thickened, severely calcified leaflets. There was trivial regurgitation. - Mitral valve: There was mild regurgitation. - Left atrium: The atrium was normal in size. - Right atrium: The atrium was normal in size. - Tricuspid valve: There was trivial regurgitation. - Pulmonary arteries: Systolic pressure was within the normal range. - Inferior vena cava: The vessel was dilated. The respirophasic diameter changes were blunted (<50%), consistent with elevated central venous pressure. - Pericardium, extracardiac: There was no pericardial effusion.  Patient Profile     72 y.o. male with a history of CKD stage III, cervical disc disease status post recent cervical laminectomy in late August.  He presents with chest pain and early documented cardiomyopathy.  Cardiac catheterization revealed moderate CAD as outlined with initial plan for medical therapy.  Hospital course also complicated by stroke in the setting of high-grade ICA stenoses, hypoxic respiratory failure requiring mechanical ventilation.  Assessment & Plan    1.  Recently documented moderate CAD including eccentric 75% proximal LAD stenosis, 75% OM 2 stenosis, and occluded RV marginal branch with left-to-right collaterals.  Plan is medical therapy at this time.  2.  Cardiomyopathy with LVEF 35 to 40% range and diffuse hypokinesis by echocardiogram.  3.  Acute on chronic renal insufficiency, creatinine has improved to 1.22.  4.  Severe ICA disease, has been evaluated by [facility] for possibility of carotid stenting.  Dr. Oneida Alar note from September 10 indicates considering intervention possibly within a 4 to 6-week interval depending on  clinical progress.  5.  Possible aspiration pneumonia.  6.  Essential hypertension, on clonidine patch.  7.  History of stroke, MRI shows left M1/M2 and right MCA/PCA watershed and right parietal punctate infarcts, consistent with hypoperfusion from right ICA high grade stenosis and left ICA occlusion.  Has been seen by Neurology.  I reviewed hospitalist note.  Planning to stop tube feeds and give oral medications as well as p.o. intake.  Would continue aspirin, Plavix, Lipitor, clonidine patch for now.  Will initiate Coreg 3.125 mg twice daily.  Not necessarily a good time to start ARB with renal function just now approaching normal, but this could be considered next.  Signed, Rozann Lesches, MD  06/15/2018, 11:45 AM

## 2018-06-16 DIAGNOSIS — I6522 Occlusion and stenosis of left carotid artery: Secondary | ICD-10-CM

## 2018-06-16 LAB — BASIC METABOLIC PANEL
ANION GAP: 9 (ref 5–15)
BUN: 24 mg/dL — AB (ref 8–23)
CALCIUM: 8.1 mg/dL — AB (ref 8.9–10.3)
CO2: 22 mmol/L (ref 22–32)
Chloride: 107 mmol/L (ref 98–111)
Creatinine, Ser: 1.39 mg/dL — ABNORMAL HIGH (ref 0.61–1.24)
GFR calc Af Amer: 56 mL/min — ABNORMAL LOW (ref >60)
GFR, EST NON AFRICAN AMERICAN: 49 mL/min — AB (ref >60)
GLUCOSE: 98 mg/dL (ref 70–99)
Potassium: 4.2 mmol/L (ref 3.5–5.1)
Sodium: 138 mmol/L (ref 135–145)

## 2018-06-16 LAB — GLUCOSE, CAPILLARY
GLUCOSE-CAPILLARY: 101 mg/dL — AB (ref 70–99)
GLUCOSE-CAPILLARY: 100 mg/dL — AB (ref 70–99)
GLUCOSE-CAPILLARY: 114 mg/dL — AB (ref 70–99)
Glucose-Capillary: 140 mg/dL — ABNORMAL HIGH (ref 70–99)
Glucose-Capillary: 168 mg/dL — ABNORMAL HIGH (ref 70–99)
Glucose-Capillary: 92 mg/dL (ref 70–99)

## 2018-06-16 LAB — MAGNESIUM: MAGNESIUM: 1.7 mg/dL (ref 1.7–2.4)

## 2018-06-16 MED ORDER — PRO-STAT SUGAR FREE PO LIQD
30.0000 mL | Freq: Two times a day (BID) | ORAL | Status: DC
  Administered 2018-06-16: 30 mL via ORAL
  Filled 2018-06-16 (×3): qty 30

## 2018-06-16 MED ORDER — ENSURE ENLIVE PO LIQD
237.0000 mL | Freq: Two times a day (BID) | ORAL | Status: DC
  Administered 2018-06-16 – 2018-06-18 (×4): 237 mL via ORAL

## 2018-06-16 MED ORDER — ACETAMINOPHEN 650 MG RE SUPP
650.0000 mg | RECTAL | Status: DC | PRN
  Administered 2018-06-16: 650 mg via RECTAL
  Filled 2018-06-16: qty 1

## 2018-06-16 NOTE — Consult Note (Signed)
Called  By Dr Randol Kern that pt is improving and may be candidate for carotid intervention in a few weeks.  He apparently is going to rehab soon.   PE:  Vitals:   06/16/18 0412 06/16/18 0918 06/16/18 1202 06/16/18 1816  BP: 129/60 (!) 154/71 (!) 111/53 120/60  Pulse: 70 99 74 84  Resp: 17  18   Temp: 98.9 F (37.2 C)  99.1 F (37.3 C)   TempSrc: Axillary  Oral   SpO2: 100%  100%   Weight: 76.6 kg     Height:        Neuro: UE/LE 4/5   Extremities 2+ femoral pulses.  A: slowly recovering s/p CVA with known left ICA occlusion right > 80% P: Will recheck pt next week on his recovery status Tentatively transfemoral carotid stent the following week  Will follow in rehab  Fabienne Bruns, MD [facility] Office: [phone] Pager: [phone]

## 2018-06-16 NOTE — Progress Notes (Signed)
Physical Therapy Treatment Patient Details Name: Ian Ramos MRN: [id number] DOB: January 03, 1946 Today's Date: 06/16/2018    History of Present Illness Pt is a 72 y/o male admitted secondary to intractable chest and L shoulder pain. Neurosurgery feels this is radicular irritation of his left-sided C5 nerve root and started pt on steroids and neurontin. Pt with recent cervical fusion (05/23/18). Imaging negative for PE and showed probable esophagitis. Pt underwent cardiac cath on 9/5. In the AM of 9/6 pt with acute change of mental status and code stroke called. Pt found to have left temporal lobe infarct and right MCA/PCA watershed and right parietal punctate infarcts. Pt also find to have significant carotid stenosis.   Pt coded 07/03/23 and was intubated.  Extubated 06/12/18.  PMH includes gout and recent Cervical fusion.     PT Comments    Pt making slow progress. Pt with significant deficits in cognition, strength, and balance that will make progress slow. Continue to recommend CIR for further rehab.    Follow Up Recommendations  CIR;Supervision for mobility/OOB     Equipment Recommendations  Other (comment)(TBA at next venue)    Recommendations for Other Services       Precautions / Restrictions Precautions Precautions: Fall;Cervical Precaution Booklet Issued: No Precaution Comments: reviewed cervical precautions Required Braces or Orthoses: Cervical Brace Cervical Brace: Soft collar;At all times Restrictions Weight Bearing Restrictions: No    Mobility  Bed Mobility Overal bed mobility: Needs Assistance Bed Mobility: Rolling;Sidelying to Sit Rolling: Max assist Sidelying to sit: Max assist       General bed mobility comments: Assist to move legs and shoulder over, to elevate trunk into sitting, and to bring hips to EOB.  Transfers Overall transfer level: Needs assistance Equipment used: Rolling walker (2 wheeled);Ambulation equipment used Transfers: Sit to/from  [employer] Sit to Stand: Mod assist;+2 physical assistance Stand pivot transfers: (Used Stedy for pivot from bed to chair)       General transfer comment: Assist to bring hips and trunk up. Pt with slow response to cues and needs incr time to intiate push into standing. Unable to make pivotal steps with walker so used Stedy for pivot to chair.  Ambulation/Gait Ambulation/Gait assistance: +2 physical assistance;Mod assist Gait Distance (Feet): 4 Feet Assistive device: Rolling walker (2 wheeled) Gait Pattern/deviations: Step-to pattern;Decreased step length - right;Decreased step length - left;Shuffle;Trunk flexed Gait velocity: Decreased Gait velocity interpretation: <1.8 ft/sec, indicate of risk for recurrent falls General Gait Details: Assist for support and balance. Verbal/tactile cues to stand more erect. Close follow with recliner behind patient.   Stairs             Wheelchair Mobility    Modified Rankin (Stroke Patients Only) Modified Rankin (Stroke Patients Only) Pre-Morbid Rankin Score: Moderately severe disability Modified Rankin: Moderately severe disability     Balance Overall balance assessment: Needs assistance Sitting-balance support: Feet supported;Bilateral upper extremity supported Sitting balance-Leahy Scale: Poor Sitting balance - Comments: min assist to sit EOB x 12-15 minutes   Standing balance support: Bilateral upper extremity supported;During functional activity Standing balance-Leahy Scale: Poor Standing balance comment: walker and min to mod assist for static standing                            Cognition Arousal/Alertness: Awake/alert Behavior During Therapy: Flat affect Overall Cognitive Status: Impaired/Different from baseline Area of Impairment: Problem solving;Awareness;Safety/judgement;Orientation;Attention;Memory;Following commands  Orientation Level: Disoriented  to;Place;Time;Situation Current Attention Level: Sustained Memory: Decreased recall of precautions;Decreased short-term memory Following Commands: Follows one step commands inconsistently;Follows one step commands with increased time Safety/Judgement: Decreased awareness of safety;Decreased awareness of deficits Awareness: Intellectual Problem Solving: Difficulty sequencing;Requires verbal cues;Slow processing;Decreased initiation;Requires tactile cues        Exercises      General Comments        Pertinent Vitals/Pain Pain Assessment: No/denies pain    Home Living                      Prior Function            PT Goals (current goals can now be found in the care plan section) Acute Rehab PT Goals Patient Stated Goal: to go home Progress towards PT goals: Progressing toward goals    Frequency           PT Plan Current plan remains appropriate    Co-evaluation              AM-PAC PT "6 Clicks" Daily Activity  Outcome Measure  Difficulty turning over in bed (including adjusting bedclothes, sheets and blankets)?: Unable Difficulty moving from lying on back to sitting on the side of the bed? : Unable Difficulty sitting down on and standing up from a chair with arms (e.g., wheelchair, bedside commode, etc,.)?: Unable Help needed moving to and from a bed to chair (including a wheelchair)?: Total Help needed walking in hospital room?: Total Help needed climbing 3-5 steps with a railing? : Total 6 Click Score: 6    End of Session Equipment Utilized During Treatment: Gait belt;Cervical collar Activity Tolerance: Patient limited by fatigue Patient left: with call bell/phone within reach;in chair;with chair alarm set Nurse Communication: Mobility status;Need for lift equipment PT Visit Diagnosis: Unsteadiness on feet (R26.81);Muscle weakness (generalized) (M62.81);Difficulty in walking, not elsewhere classified (R26.2);Other symptoms and signs involving  the nervous system (R29.898)     Time: 4709-6283 PT Time Calculation (min) (ACUTE ONLY): 33 min  Charges:  $Therapeutic Activity: 23-37 mins                     [facility] PT Acute Rehabilitation Services Pager [phone] Office [phone]    [facility] 06/16/2018, 3:36 PM

## 2018-06-16 NOTE — Progress Notes (Addendum)
Progress Note  Patient Name: Ian Ramos Date of Encounter: 06/16/2018  Primary Cardiologist: Dr. Fransico Him  Subjective   Pt feeling good today. Denies chest pain or SOB.   Inpatient Medications    Scheduled Meds: .  stroke: mapping our early stages of recovery book   Does not apply Once  . aspirin  162 mg Per Tube Daily  . atorvastatin  80 mg Per Tube q1800  . carvedilol  3.125 mg Oral BID WC  . chlorhexidine gluconate (MEDLINE KIT)  15 mL Mouth Rinse BID  . cloNIDine  0.1 mg Transdermal Weekly  . clopidogrel  75 mg Per Tube Daily  . enoxaparin (LOVENOX) injection  40 mg Subcutaneous Q24H  . insulin aspart  0-15 Units Subcutaneous Q4H  . iopamidol  100 mL Intravenous Once  . mouth rinse  15 mL Mouth Rinse BID  . sodium chloride flush  3 mL Intravenous Q12H   Continuous Infusions: . sodium chloride 10 mL/hr at 06/09/18 1610  . sodium chloride 75 mL/hr at 06/16/18 0512  . famotidine (PEPCID) IV Stopped (06/15/18 1719)  . feeding supplement (VITAL AF 1.2 CAL) Stopped (06/15/18 1051)  . norepinephrine (LEVOPHED) Adult infusion Stopped (06/12/18 0046)   PRN Meds: sodium chloride, acetaminophen, fentaNYL (SUBLIMAZE) injection, RESOURCE THICKENUP CLEAR, sodium chloride flush   Vital Signs    Vitals:   06/15/18 0800 06/15/18 1350 06/15/18 2009 06/16/18 0412  BP: 131/66 115/64 129/67 129/60  Pulse: 88 84 84 70  Resp:  (!) 21 (!) 21 17  Temp: 98.9 F (37.2 C) 98.3 F (36.8 C) 99.3 F (37.4 C) 98.9 F (37.2 C)  TempSrc: Oral Oral Oral Axillary  SpO2: 98%  99% 100%  Weight:    76.6 kg  Height:        Intake/Output Summary (Last 24 hours) at 06/16/2018 0741 Last data filed at 06/16/2018 0030 Gross per 24 hour  Intake 1514.24 ml  Output 900 ml  Net 614.24 ml   Filed Weights   06/14/18 0400 06/15/18 0509 06/16/18 0412  Weight: 76 kg 76.4 kg 76.6 kg    Physical Exam   General: Older than stated age, NAD Skin: Warm, dry, intact  Head:  Normocephalic, atraumatic, clear, moist mucus membranes. Neck: Negative for carotid bruits. No JVD Lungs: Clear to ausculation bilaterally. No wheezes, rales, or rhonchi. Breathing is unlabored. Cardiovascular: RRR with S1 S2. No murmurs, rubs, gallops, or LV heave appreciated. Abdomen: Soft, non-tender, non-distended with normoactive bowel sounds. No obvious abdominal masses. MSK: Strength and tone appear normal for age. 5/5 in all extremities Extremities: No edema. No clubbing or cyanosis. DP/PT pulses 2+ bilaterally Neuro: Alert and oriented to self and place. No focal deficits. No facial asymmetry. MAE spontaneously. Psych: Responds to questions appropriately with normal affect.    Labs    Chemistry Recent Labs  Lab 06/13/18 0435 06/14/18 0214 06/15/18 0719  NA 139 137 136  K 3.7 3.8 4.0  CL 112* 108 108  CO2 21* 22 21*  GLUCOSE 153* 133* 146*  BUN 34* 33* 28*  CREATININE 1.25* 1.18 1.22  CALCIUM 7.6* 7.7* 7.6*  GFRNONAA 55* 59* 57*  GFRAA >60 >60 >60  ANIONGAP _0 Hematology Recent Labs  Lab 06/13/18 0435 06/14/18 0214 06/15/18 0719  WBC 8.0 6.1 6.8  RBC 2.93* 2.73* 3.01*  HGB 8.6* 8.1* 8.9*  HCT 26.2* 24.9* 27.3*  MCV 89.4 91.2 90.7  MCH 29.4 29.7 29.6  MCHC 32.8  32.5 32.6  RDW 12.1 12.6 12.4  PLT 160 165 173    Cardiac EnzymesNo results for input(s): TROPONINI in the last 168 hours. No results for input(s): TROPIPOC in the last 168 hours.   BNPNo results for input(s): BNP, PROBNP in the last 168 hours.   DDimer No results for input(s): DDIMER in the last 168 hours.   Radiology    No results found.  Telemetry    06/16/18 NSR with HR's 70-90's - Personally Reviewed  ECG     No new tracings as of 06/16/18- Personally Reviewed  Cardiac Studies   Cardiaccatheterization 2018/06/16:  Prox RCA to Mid RCA lesion is 30% stenosed.  Acute Mrg lesion is 100% stenosed.  Prox LAD lesion is 75% stenosed.  Ost 2nd Mrg to 2nd Mrg lesion is 75%  stenosed.  LV end diastolic pressure is normal.   1. Moderate obstructive CAD - Eccentric 75% proximal LAD - 75% OM2 - Occluded RV marginal branch with left to right collaterals. 2. Normal LVEDP  Plan: Patient does not have critical CAD. I would recommend medical management. If he has refractory angina despite optimal medical therapy would consider PCI of LAD. The OM lesion would be more difficult to treat since it involves bifurcation of a large OM1.  Echocardiogram 05/29/2018: Study Conclusions  - Left ventricle: The cavity size was normal. Systolic function was moderately reduced. The estimated ejection fraction was in the range of 35% to 40%. Diffuse hypokinesis. Doppler parameters are consistent with abnormal left ventricular relaxation (grade 1 diastolic dysfunction). Doppler parameters are consistent with elevated ventricular end-diastolic filling pressure. - Aortic valve: A bicuspid morphology cannot be excluded; severely thickened, severely calcified leaflets. There was trivial regurgitation. - Mitral valve: There was mild regurgitation. - Left atrium: The atrium was normal in size. - Right atrium: The atrium was normal in size. - Tricuspid valve: There was trivial regurgitation. - Pulmonary arteries: Systolic pressure was within the normal range. - Inferior vena cava: The vessel was dilated. The respirophasic diameter changes were blunted (<50%), consistent with elevated central venous pressure. - Pericardium, extracardiac: There was no pericardial effusion.  Patient Profile     72 y.o. male with a history of CKD stage III, cervical disc disease status post recent cervical laminectomy in late August. He presents with chest pain and early documented cardiomyopathy. Cardiac catheterization revealed moderate CAD as outlined with initial plan for medical therapy. Hospital course also complicated by stroke in the setting of high-grade  ICA stenoses, hypoxic respiratory failure requiring mechanical ventilation.  Assessment & Plan    1. Moderate CAD: -Per cardiac catheterization on Jun 16, 2018 with moderate obstructive CAD of the 75% LAD and 75% OM2 with occluded RV marginal branch with left to right collaterals with recommendations for medical management>>>if refractory anginal pain could consider PCI to LAD.  -Continue ASA 81 daily, Plavix, Lipitor, coreg 3.137m twice daily  -Denies chest pain today   2. Cardiomyopathy: -LVEF 35-40% with diffuse hypokinesis per echocardiogram 05/29/18 -Continue carvedilol 3.1274mtwice daily.  -No ARB secondary to acute renal insuffiencey>>now that is more stabilized, consider adding today or tomorrow per MD   3. Acute on chronic kidney disease: -Creatinine, 1.22 on 06/15/18>>improving   -Will order BMET for this AM  -Baseline appears to be in 1.5-1.6 range  -Hold ARB for now>>consider once renal function more stabilized  -Avoid nephrotoxic medications   4. Severe ICA disease: -Evaluated by [facility] for carotid stenting>>>>likley in the next 4-6 weeks depending on clinical progress  5. Possible aspiration PNA: -Per primary team -No fever, WBC within normal limits   6. Essential HTN: -Stable, 1289/60>129/67>115/64 -Continue Coreg, clonidine   7. Hx of stroke: -Per MRI which showed left M1/M2 and right MCA/PCA watershed and right parietal punctate infarcts>>consisitent with hypoperfusion from right ICA high grade stenosis and left ICA occulusion -Risk reduction with statin, Plavix, DM control  -Per neurology/primary team   Signed, Kathyrn Drown NP-C [facility] Pager: [phone] 06/16/2018, 7:41 AM     For questions or updates, please contact   Please consult [email or web address] for contact info under Cardiology/STEMI.  Patient examined chart reviewed. No angina. Medical Rx CAD. Exam with right bruit previous cervical spine surgery Continue coreg, statin ASA/Plavix. Had occluded  LICA and high grade right ICA being followed by [facility] ? Intervention in 4-6 weeks post CVA   Jenkins Rouge

## 2018-06-16 NOTE — Progress Notes (Signed)
Inpatient Rehabilitation-Admissions Coordinator   Spoke with pt at the bedside (and with significant other over the phone) to confirm continued interest in CIR. Once there are updated OT therapy notes in the chart, AC will proceed with insurance authorization process for possible admit to CIR.   Please call if questions.   [name], OTR/L  Rehab Admissions Coordinator  [phone] 06/16/2018 4:15 PM

## 2018-06-16 NOTE — Progress Notes (Signed)
PROGRESS NOTE                                                                                                                                                                                                             Patient Demographics:    Ian Ramos, is a 72 y.o. male, DOB - [birth year], ZOX:[id number]  Admit date - 05/28/2018   Admitting Physician Ian Bulls, MD  Outpatient Primary MD for the patient is Ian Maw, MD  LOS - 12   Chief Complaint  Patient presents with  . Chest Pain       Brief Narrative    72 year old male patient history of stage III chronic kidney disease, cervical spine stenosis status post recent cervical laminectomy August 2019.  Presented to the emergency room on 8/28 with chief complaint of chest pain, shoulder pain and substernal discomfort..  Was found to have some esophageal thickening, possibly some esophageal dysmotility, etiology was never clearly identified but resolved.  He was found to have new dilated cardiomyopathy with EF 35% and diffuse hypokinesis, and bundle branch block, but it was unclear as to whether or not if this was the primary cause of his discomfort.  The plan was to proceed with iliac ischemia evaluation.  He underwent cardiac catheterization on 9/5: Showed moderate obstructive coronary artery disease with 75% occluded proximal LAD 75% OM 2 occluded RV marginal branch with left to right collaterals.  This was not considered critical CAD and medical management was recommended. On 9/6 the patient developed acute mental status change.  On initial eval he was confused, preservation, and had right upper extremity drift and right visual field cut.  CT brain history left temporal lobe infarct felt possibly embolic in etiology.  Carotid ultrasound showed likely left carotid occlusion with high-grade right carotid stenosis.  Vascular surgery was consulted.  Plavix was recommended and delaying  carotid endarterectomy for further recovery was felt best course of action. 9/7: Continues to demonstrate aphasia.  Also with ongoing left upper extremity weakness.  MRI showed right MCA/PCA watershed and right parietal punctate infarcts felt more consistent with high-grade ICA stenosis on the left. 9/8: Patient vomited twice during the day while family was assisting with feeding 9/9: Found unresponsive in chair.  Blood pressure 48/32 heart rate 58 area not clear that he lost pulses.  Pulse  oximetry not recorded.  He was apneic, Code blue was called patient was intubated and transferred to the intensive care.  Consults: GI 8/29: Found asymptomatic esophagitis.  Also evidence of esophageal dysmotility on esophagram.  GI signed off on 8/30 with final recommendations for daily PPI x4 weeks and routine cancer screening Cardiology consulted 9/3 for chest pain.  Underwent left heart cath which showed diffuse coronary artery disease but noncritical recommended medical therapy, because he had bradycardia and CKD ARB was held he was recommended to continue hydralazine, nitrates and diuretics 9/7: Neurology consulted for stroke Vascular surgery consulted 9/6: For bilateral carotid artery disease: Started on Plavix.  Recommending gentle hydration.  Initial recommendation for stenting within 2 weeks, but given particular intubation, current recommendation is for carotid stenting within 4 to 6 weeks(note was on 06/10/2018)    Subjective:    Ian Ramos today has, No headache, No chest pain, No abdominal pain - No Nausea,   Assessment  & Plan :    Principal Problem:   Intractable pain Active Problems:   Cervical myelopathy (HCC)   Chest pain   CKD (chronic kidney disease), stage III (HCC)   Esophageal thickening   LBBB (left bundle branch block)   Postoperative anemia   DCM (dilated cardiomyopathy) (HCC)   Acute systolic CHF (congestive heart failure) ([place])   Coronary artery disease due to lipid  rich plaque   Cerebrovascular accident (CVA) due to bilateral stenosis of carotid arteries ([place])   On mechanically assisted ventilation ([place])   Acute respiratory failure ([place])   Essential hypertension   Acute encephalopathy   Acute hypoxemic respiratory failure -  requiring intubation and mechanical ventilation. Unclear at this time what the etiology of his respiratory arrest was. - extubated, significantly improving, encouraged to use incentive spirometry and flutter valve, currently on room air.    Acute bilateral CVA: -  Initially felt to be embolic, though with critical bilateral carotid stenosis and hypotension as well as watershed distribution of most infarcts, ICA stenosis is felt to be etiology. Locations include periventricular left M1/M2 distribution, right MCA/PCA distribution, and right temporoparietal cortex with resultant confusion and aphasia.   - On antiplatelet per neurology, statin - HbA1c 6.1%,  Bilateral carotid artery stenosis: Right ICA severely occluded (80-99%) and left ICA said to be completely occluded.  - Ideally 130-128mHg SBP  - Vascular surgery consulted.Dr. FOneida Ramos from September 10 indicated considering intervention possibly within a 4 to 6-week interval depending on clinical progress. - ASA, plavix, statin  -Please see discussion below regarding carotid artery stenosis -Continue with PT/OT, TIR consulted  Cardiomyopathy with LVEF 35 to 40% range and diffuse hypokinesis by echocardiogram. -Management per cardiology, with known history of CAD, left cardiac cath 06/05/2018 significant for eccentric 75% proximal LAD stenosis, 75% OM 2 stenosis, and occluded RV marginal branch with left-to-right collaterals.  Plan is medical therapy at per cardiology, noted on beta-blockers, already on clonidine patch, on aspirin, Plavix and statin, currently not on ACE or arms given his renal failure, but given renal function back to baseline, cardiology considering to  resume soon,   Cervical myelopathy status post laminectomy -By PT/OT, recommendation for CIR -Wound looks clean  Anemia of chronic disease -Need to monitor and transfuse as needed  Acute on chronic renal insufficiency -Continuing back to baseline, it is 1.39 today  Acute encephalopathy, acute delirium: -Multifactorial, in the setting of CVA, recent surgery, ICU delirium and stay, prolonged hospitalization, appears to be improving. -Diet has been advanced by  SLP, I have discontinued his feeding tube today, he had a good breakfast already, encouraged to increase his fluid intake.  Urinary retention. - Foley catheter discontinued yesterday, no recurrence of retention  Hypertension -Continue with clonidine patch, he was started on low-dose Coreg by cardiology as well  Code Status : Full  Family Communication  : Daughter at bedside  Disposition Plan  : CIR   Consults  : Cardiology, neurology, PCCM vascular surgery  Procedures  :  Left heart cath 9/5 showing moderate noncritical coronary artery disease.  75% occluded proximal LAD 75% occluded OM.  Medical management was recommended  Significant Diagnostic Tests: Echocardiogram 8/29:EF 35 to 40%.  Diffuse hypokinesis.  Doppler parameters consistent with grade 1 diastolic dysfunction. 9/6: MRI brainExtensive motion artifact.  Minimal flow related signal in the left cervical ICA and absent flow of the left ICA representing occlusion or proximal high-grade stenosis.  There is a large anterior communicating artery providing collateral circulation to the left MCA and left PCA. 9/6: CT brain: Subtle hypodensity in the anterior mid left temporal lobe 9/4: Nuclear stress test EF 39% findings were consistent with ischemia felt to be intermittent risk 8/30: Esophagram showing moderate esophageal dysmotility 9/12 extubated, confused  DVT Prophylaxis  :  lovenox  Lab Results  Component Value Date   PLT 173 06/15/2018    Antibiotics  :     Anti-infectives (From admission, onward)   None        Objective:   Vitals:   06/15/18 2009 06/16/18 0412 06/16/18 0918 06/16/18 1202  BP: 129/67 129/60 (!) 154/71 (!) 111/53  Pulse: 84 70 99 74  Resp: (!) _0 Temp: 99.3 F (37.4 C) 98.9 F (37.2 C)  99.1 F (37.3 C)  TempSrc: Oral Axillary  Oral  SpO2: 99% 100%  100%  Weight:  76.6 kg    Height:        Wt Readings from Last 3 Encounters:  06/16/18 76.6 kg  05/23/18 79.4 kg  05/16/18 79.4 kg     Intake/Output Summary (Last 24 hours) at 06/16/2018 1521 Last data filed at 06/16/2018 1300 Gross per 24 hour  Intake 1754.24 ml  Output -  Net 1754.24 ml     Physical Exam  Awake Alert, Oriented X2, remains confused, but more appropriate today no new F.N deficits, Normal affect Symmetrical Chest wall movement, Good air movement bilaterally, CTAB RRR,No Gallops,Rubs or new Murmurs, No Parasternal Heave +ve B.Sounds, Abd Soft, No tenderness, No rebound - guarding or rigidity. No Cyanosis, Clubbing or edema, No new Rash or bruise     Data Review:    CBC Recent Labs  Lab 06/10/18 0327 06/11/18 0302 06/12/18 0431 06/13/18 0435 06/14/18 0214 06/15/18 0719  WBC 18.5* 11.1* 7.5 8.0 6.1 6.8  HGB 11.0* 10.0* 9.1* 8.6* 8.1* 8.9*  HCT 33.7* 30.5* 28.3* 26.2* 24.9* 27.3*  PLT 195 146* 128* 160 165 173  MCV 90.8 91.0 91.0 89.4 91.2 90.7  MCH 29.6 29.9 29.3 29.4 29.7 29.6  MCHC 32.6 32.8 32.2 32.8 32.5 32.6  RDW 12.1 12.0 12.0 12.1 12.6 12.4  LYMPHSABS 0.8 1.1  --   --   --   --   MONOABS 1.5* 0.9  --   --   --   --   EOSABS 0.1 0.1  --   --   --   --   BASOSABS 0.0 0.0  --   --   --   --  Chemistries  Recent Labs  Lab 06/12/18 0431 06/12/18 1648 06/13/18 0435 06/14/18 0214 06/15/18 0719 06/16/18 0625  NA 136  --  139 137 136 138  K 4.1  --  3.7 3.8 4.0 4.2  CL 109  --  112* 108 108 107  CO2 20*  --  21* 22 21* 22  GLUCOSE 257*  --  153* 133* 146* 98  BUN 28*  --  34* 33* 28* 24*  CREATININE  1.38*  --  1.25* 1.18 1.22 1.39*  CALCIUM 8.0*  --  7.6* 7.7* 7.6* 8.1*  MG 1.7 2.0 2.1 1.6*  --  1.7   ------------------------------------------------------------------------------------------------------------------ No results for input(s): CHOL, HDL, LDLCALC, TRIG, CHOLHDL, LDLDIRECT in the last 72 hours.  Lab Results  Component Value Date   HGBA1C 6.1 (H) 06/06/2018   ------------------------------------------------------------------------------------------------------------------ No results for input(s): TSH, T4TOTAL, T3FREE, THYROIDAB in the last 72 hours.  Invalid input(s): FREET3 ------------------------------------------------------------------------------------------------------------------ No results for input(s): VITAMINB12, FOLATE, FERRITIN, TIBC, IRON, RETICCTPCT in the last 72 hours.  Coagulation profile No results for input(s): INR, PROTIME in the last 168 hours.  No results for input(s): DDIMER in the last 72 hours.  Cardiac Enzymes No results for input(s): CKMB, TROPONINI, MYOGLOBIN in the last 168 hours.  Invalid input(s): CK ------------------------------------------------------------------------------------------------------------------ No results found for: BNP  Inpatient Medications  Scheduled Meds: .  stroke: mapping our early stages of recovery book   Does not apply Once  . aspirin  162 mg Per Tube Daily  . atorvastatin  80 mg Per Tube q1800  . carvedilol  3.125 mg Oral BID WC  . chlorhexidine gluconate (MEDLINE KIT)  15 mL Mouth Rinse BID  . cloNIDine  0.1 mg Transdermal Weekly  . clopidogrel  75 mg Per Tube Daily  . enoxaparin (LOVENOX) injection  40 mg Subcutaneous Q24H  . feeding supplement (ENSURE ENLIVE)  237 mL Oral BID BM  . feeding supplement (PRO-STAT SUGAR FREE 64)  30 mL Oral BID  . insulin aspart  0-15 Units Subcutaneous Q4H  . iopamidol  100 mL Intravenous Once  . mouth rinse  15 mL Mouth Rinse BID  . sodium chloride flush  3 mL  Intravenous Q12H   Continuous Infusions: . sodium chloride 10 mL/hr at 06/09/18 1610  . sodium chloride 75 mL/hr at 06/16/18 0512  . famotidine (PEPCID) IV Stopped (06/15/18 1719)  . feeding supplement (VITAL AF 1.2 CAL) Stopped (06/15/18 1051)  . norepinephrine (LEVOPHED) Adult infusion Stopped (06/12/18 0046)   PRN Meds:.sodium chloride, acetaminophen, fentaNYL (SUBLIMAZE) injection, RESOURCE THICKENUP CLEAR, sodium chloride flush  Micro Results Recent Results (from the past 240 hour(s))  Culture, respiratory (non-expectorated)     Status: None   Collection Time: 06/09/18  4:48 PM  Result Value Ref Range Status   Specimen Description TRACHEAL ASPIRATE  Final   Special Requests NONE  Final   Gram Stain   Final    RARE WBC PRESENT, PREDOMINANTLY PMN FEW GRAM POSITIVE COCCI FEW GRAM POSITIVE RODS    Culture   Final    Consistent with normal respiratory flora. Performed at [facility], [place] [address], [place], [place] [address]    Report Status 06/11/2018 FINAL  Final    Radiology Reports Dg Chest 2 View  Result Date: 05/28/2018 CLINICAL DATA:  Chest pain and weakness for a few days. The patient underwent cervical fusion 05/23/2018. EXAM: CHEST - 2 VIEW COMPARISON:  PA and lateral chest 05/26/2018. FINDINGS: Lung volumes are low with minimal atelectasis  in the left base. No pneumothorax or pleural effusion. Heart size is normal. No acute or focal bony abnormality. Degenerative disease about the shoulders noted. IMPRESSION: No acute disease. Electronically Signed   By: Inge Rise M.D.   On: 05/28/2018 11:57   Dg Chest 2 View  Result Date: 05/26/2018 CLINICAL DATA:  72 year old male with weakness and cough. Status post recent cervical spine surgery. EXAM: CHEST - 2 VIEW COMPARISON:  None. FINDINGS: Cardiac and mediastinal contours are within normal limits. Atherosclerotic calcifications are present within the transverse aorta. Mild linear opacity in the lingula most  consistent with atelectasis. No evidence of pulmonary edema, pleural effusion or pneumothorax. No suspicious pulmonary nodule or mass. Degenerative changes are present in both shoulders including the acromioclavicular joints and glenohumeral joints. High-riding humeral heads bilaterally with decreased subacromial space consistent with chronic rotator cuff injuries. No acute osseous abnormality. IMPRESSION: 1. Linear opacity in the lingula most consistent with atelectasis. 2.  Aortic Atherosclerosis (ICD10-170.0) 3. Bilateral degenerative changes in the glenohumeral and acromioclavicular joints. Electronically Signed   By: Jacqulynn Cadet M.D.   On: 05/26/2018 08:01   Dg Cervical Spine 2-3 Views  Result Date: 05/23/2018 CLINICAL DATA:  C1-C6 decompression and fusion. EXAM: CERVICAL SPINE - 2-3 VIEW; DG C-ARM 61-120 MIN COMPARISON:  MRI 01/01/2018 FINDINGS: C-arm images show posterior fusion with lateral mass screws and posterior rods from C1 through C6. No radiographically detectable complication on this limited imaging. IMPRESSION: Posterior fusion from C1 through C6. Electronically Signed   By: Nelson Chimes M.D.   On: 05/23/2018 16:26   Ct Cervical Spine Wo Contrast  Result Date: 05/28/2018 CLINICAL DATA:  Chest pain.  Recent cervical spine surgery. EXAM: CT CERVICAL SPINE WITHOUT CONTRAST TECHNIQUE: Multidetector CT imaging of the cervical spine was performed without intravenous contrast. Multiplanar CT image reconstructions were also generated. COMPARISON:  MRI cervical spine January 01, 2018 FINDINGS: ALIGNMENT: Straightened cervical lordosis.  No malalignment. SKULL BASE AND VERTEBRAE: Vertebral bodies intact. Interval C1 through C6 laminectomies. Intact well-seated lateral mass screws. The RIGHT C3, C4, C5 and C6 screws are intra-articular. The LEFT C3 through C6 facet screws are intra-articular, the LEFT facet screw extends into the foramen transversarium. The LEFT C6 screw extends into the cephalad  aspect of the foramen transversarium. Intact hardware. Non incorporated posterior bone graft material. Stable moderate to severe disc height loss C3-4 through T1-2 with endplate sclerosis and marginal spurring compatible with degenerative discs. Stable geographic subcentimeter cyst and posterior odontoid process without cortical disruption. 1 cm calcified pannus posterior to the odontoid process seen with CPPD without destructive bony changes. SOFT TISSUES AND SPINAL CANAL: And gas within the surgical bed and ventral epidural space at C1-2 consistent with recent surgery. Small prevertebral effusion is likely postoperative. Mild calcific atherosclerosis aortic arch. DISC LEVELS: Canal obscured by hardware streak artifact. Neural foraminal narrowing all cervical levels varying from moderate to severe. UPPER CHEST: Lung apices are clear. OTHER: None. IMPRESSION: 1. Status post recent C1 through C6 laminectomies and posterior instrumentation. Multilevel intra-articular facet screws, LEFT C2 screw in foramen transversarium. 2. Postoperative fluid collection and gas consistent with recent surgery/seroma, pseudomeningocele not excluded. If there is clinical concern for infection, consider sampling. 3. No fracture deformity or malalignment. Aortic Atherosclerosis (ICD10-I70.0). Electronically Signed   By: Elon Alas M.D.   On: 05/28/2018 16:32   [name]  Result Date: 06/06/2018 CLINICAL DATA:  72 y/o M; acute altered mental status and right-sided weakness. Stroke evaluation. EXAM: MRI HEAD  WITHOUT CONTRAST MRA HEAD WITHOUT CONTRAST TECHNIQUE: Axial DWI, coronal DWI, axial T2 FLAIR sequences of the head were acquired. Angiographic images of the head were obtained using MRA technique without contrast. The patient was unable to continue and additional sequences were not acquired. COMPARISON:  06/06/2018 CT head.  05/28/2018 CT cervical spine. FINDINGS: MRI HEAD FINDINGS Brain: Subcentimeter foci of  reduced diffusion compatible with acute/early subacute infarction are present, 2 in the left superior frontal lobe, and 2 in the right posterior lateral parietal lobe. No signal abnormality in the left anterior temporal lobe, findings on CT likely related to artifact. Several nonspecific T2 FLAIR hyperintensities in subcortical and periventricular white matter are compatible with mild chronic microvascular ischemic changes for age. Mild volume loss of the brain. No hydrocephalus, extra-axial collection, or herniation. Vascular: As below. Skull and upper cervical spine: Partially visualized cervical spinal fusion and laminectomy. Large fluid collection within the postoperative bed as seen on prior CT of the cervical spine. No reduced diffusion of the visible portions of the fluid collection to indicate infection. Sinuses/Orbits: Negative. Other: None. MRA HEAD FINDINGS Motion degraded study. Anterior circulation: Minimal flow related signal within the left cervical internal carotid artery and no appreciable flow related signal of the left petrous and cavernous segments of the left internal carotid artery. Faint flow related signal within the terminal left ICA, probably retrograde. Right internal carotid artery is patent, moderate to severe stenosis of the right cavernous segment. Normal flow related signal within the right MCA and bilateral ACA distributions. Large anterior communicating artery largely perfusing the left MCA distribution which is patent. Asymmetry in the MCA distribution signal, left less than right, is likely due to signal loss from delayed transit via A-comm collateralization. Posterior circulation: Faint flow related signal in the left posterior cerebral artery which appears to be a fetal PCA is likely due to signal loss from delayed transit due to A-comm collateralization from the right ICA. Normal flow related signal within the vertebral arteries, basilar artery, and right PCA. IMPRESSION: MRI  head: 1. Diffusion and T2 FLAIR weighted sequences were acquired. The patient was unable to continue and additional sequences were not acquired. 2. Subcentimeter acute/early subacute infarctions, 2 in the left superior frontal lobe, and 2 in the right posterolateral parietal lobe. No associated mass effect. 3. Mild chronic microvascular ischemic changes and volume loss of the brain. MRA head: 1. Extensive motion artifact. 2. Minimal flow related signal in the left cervical ICA and absent flow related signal within the petrous and cavernous segments of left ICA. Findings may represent occlusion or proximal high-grade stenosis. Age indeterminate. 3. Large anterior communicating artery providing collateral circulation to the left MCA and left PCA distributions. Decreased signal in left MCA and left PCA distribution from increased transit time. CTA of the head and neck can better assess vessel stenosis and patency. If clinically indicated consider CT perfusion of the head to assess for brain oligemia. These results will be called to the ordering clinician or representative by the Radiologist Assistant, and communication documented in the PACS or zVision Dashboard. Electronically Signed   By: Kristine Garbe M.D.   On: 06/06/2018 18:14   Dg Esophagus  Result Date: 05/30/2018 CLINICAL DATA:  Esophageal wall thickening on CT EXAM: ESOPHOGRAM/BARIUM SWALLOW TECHNIQUE: Single contrast examination was performed using  thin barium. FLUOROSCOPY TIME:  Fluoroscopy Time:  42 seconds Radiation Exposure Index (if provided by the fluoroscopic device): 12.5 mGy Number of Acquired Spot Images: 8 COMPARISON:  CTA chest dated  05/28/2018 FINDINGS: Limited evaluation due to difficulty with patient positioning and inability to perform double-contrast evaluation. Single contrast evaluation in the recumbent supine position was performed. No mucosal irregularity to suggest esophageal mass. No fixed esophageal narrowing or  stricture. Moderate esophageal dysmotility. No evidence of hiatal hernia. Visualized stomach is grossly unremarkable. IMPRESSION: Moderate esophageal dysmotility. Otherwise negative single-contrast esophagram. Electronically Signed   By: Julian Hy M.D.   On: 05/30/2018 09:30   Nm Myocar Multi W/spect W/wall Motion / Ef  Result Date: 06/04/2018  There was no ST segment deviation noted during stress.  Defect 1: There is a medium defect of moderate severity present in the basal anteroseptal and mid anteroseptal location.  Defect 2: There is a medium defect of severe severity present in the basal inferior and mid inferior location.  This is an intermediate risk study.  Findings consistent with ischemia.  Nuclear stress EF: 39%.  The left ventricular ejection fraction is moderately decreased (30-44%).  Abnormal, intermediate risk stress nuclear study with predominantly fixed anteroseptal defect possibly secondary to left bundle branch block; mild to moderate inferior ischemia; gated ejection fraction 39% with hypokinesis of the septum, mild left ventricular enlargement.   Dg Chest Port 1 View  Result Date: 06/12/2018 CLINICAL DATA:  Respiratory failure EXAM: PORTABLE CHEST 1 VIEW COMPARISON:  06/09/2018 FINDINGS: Endotracheal tube and left subclavian central line are again noted and stable. Feeding catheter has been placed and courses towards the stomach. Previously seen nasogastric catheter has been removed. Postsurgical changes in the cervical spine are noted. Cardiac shadow is stable. Lungs are well aerated bilaterally without focal infiltrate or sizable effusion. IMPRESSION: Tubes and lines as described above. No acute abnormality noted. Electronically Signed   By: Inez Catalina M.D.   On: 06/12/2018 09:59   Dg Chest Port 1 View  Result Date: 06/09/2018 CLINICAL DATA:  Central line placement confirmation EXAM: PORTABLE CHEST 1 VIEW COMPARISON:  06/09/2018 1645 hours FINDINGS: Endotracheal and  NG tubes stable. Left subclavian central venous catheter placed. Tip is in the mid SVC. There is no ensuing left pneumothorax. Normal heart size. Clear and under aerated lungs. IMPRESSION: Left subclavian central venous catheter placement with its tip in the mid SVC and no pneumothorax per Clear lungs. Stable endotracheal and NG tubes. Electronically Signed   By: Marybelle Killings M.D.   On: 06/09/2018 18:09   Dg Chest Port 1 View  Result Date: 06/09/2018 CLINICAL DATA:  72 year old male with acute respiratory failure. Subsequent encounter. EXAM: PORTABLE CHEST 1 VIEW COMPARISON:  05/28/2018 CT and chest x-ray. FINDINGS: Endotracheal tube tip centrally located 4.9 cm above the carina. Nasogastric tube tip gastric fundus level. No infiltrate or congestive heart failure. No obvious pneumothorax. Heart size within normal limits. Calcified mildly tortuous aorta. IMPRESSION: 1. No infiltrate or congestive heart failure. 2.  Aortic Atherosclerosis (ICD10-I70.0). Electronically Signed   By: Genia Del M.D.   On: 06/09/2018 16:57   Dg Abd Portable 1v  Result Date: 06/11/2018 CLINICAL DATA:  Feeding tube placement EXAM: PORTABLE ABDOMEN - 1 VIEW COMPARISON:  06/10/2018 FINDINGS: Feeding tube has been placed with the tip in the distal descending duodenum. Nonobstructive bowel gas pattern. IMPRESSION: Feeding tube in the distal descending duodenum. Electronically Signed   By: Rolm Baptise M.D.   On: 06/11/2018 11:12   Dg Abd Portable 1v  Result Date: 06/10/2018 CLINICAL DATA:  Status post OG tube placement. EXAM: PORTABLE ABDOMEN - 1 VIEW COMPARISON:  None. FINDINGS: OG tube is in place with  the tip just within the stomach. Side port is in the distal esophagus. Recommend advancement of 5-6 cm. IMPRESSION: As above. Electronically Signed   By: Inge Rise M.D.   On: 06/10/2018 10:27   Dg C-arm 1-60 Min  Result Date: 05/23/2018 CLINICAL DATA:  C1-C6 decompression and fusion. EXAM: CERVICAL SPINE - 2-3 VIEW; DG  C-ARM 61-120 MIN COMPARISON:  MRI 01/01/2018 FINDINGS: C-arm images show posterior fusion with lateral mass screws and posterior rods from C1 through C6. No radiographically detectable complication on this limited imaging. IMPRESSION: Posterior fusion from C1 through C6. Electronically Signed   By: Nelson Chimes M.D.   On: 05/23/2018 16:26   Mr Jodene Nam Head Wo Contrast  Result Date: 06/06/2018 CLINICAL DATA:  72 y/o M; acute altered mental status and right-sided weakness. Stroke evaluation. EXAM: MRI HEAD WITHOUT CONTRAST MRA HEAD WITHOUT CONTRAST TECHNIQUE: Axial DWI, coronal DWI, axial T2 FLAIR sequences of the head were acquired. Angiographic images of the head were obtained using MRA technique without contrast. The patient was unable to continue and additional sequences were not acquired. COMPARISON:  06/06/2018 CT head.  05/28/2018 CT cervical spine. FINDINGS: MRI HEAD FINDINGS Brain: Subcentimeter foci of reduced diffusion compatible with acute/early subacute infarction are present, 2 in the left superior frontal lobe, and 2 in the right posterior lateral parietal lobe. No signal abnormality in the left anterior temporal lobe, findings on CT likely related to artifact. Several nonspecific T2 FLAIR hyperintensities in subcortical and periventricular white matter are compatible with mild chronic microvascular ischemic changes for age. Mild volume loss of the brain. No hydrocephalus, extra-axial collection, or herniation. Vascular: As below. Skull and upper cervical spine: Partially visualized cervical spinal fusion and laminectomy. Large fluid collection within the postoperative bed as seen on prior CT of the cervical spine. No reduced diffusion of the visible portions of the fluid collection to indicate infection. Sinuses/Orbits: Negative. Other: None. MRA HEAD FINDINGS Motion degraded study. Anterior circulation: Minimal flow related signal within the left cervical internal carotid artery and no appreciable flow  related signal of the left petrous and cavernous segments of the left internal carotid artery. Faint flow related signal within the terminal left ICA, probably retrograde. Right internal carotid artery is patent, moderate to severe stenosis of the right cavernous segment. Normal flow related signal within the right MCA and bilateral ACA distributions. Large anterior communicating artery largely perfusing the left MCA distribution which is patent. Asymmetry in the MCA distribution signal, left less than right, is likely due to signal loss from delayed transit via A-comm collateralization. Posterior circulation: Faint flow related signal in the left posterior cerebral artery which appears to be a fetal PCA is likely due to signal loss from delayed transit due to A-comm collateralization from the right ICA. Normal flow related signal within the vertebral arteries, basilar artery, and right PCA. IMPRESSION: MRI head: 1. Diffusion and T2 FLAIR weighted sequences were acquired. The patient was unable to continue and additional sequences were not acquired. 2. Subcentimeter acute/early subacute infarctions, 2 in the left superior frontal lobe, and 2 in the right posterolateral parietal lobe. No associated mass effect. 3. Mild chronic microvascular ischemic changes and volume loss of the brain. MRA head: 1. Extensive motion artifact. 2. Minimal flow related signal in the left cervical ICA and absent flow related signal within the petrous and cavernous segments of left ICA. Findings may represent occlusion or proximal high-grade stenosis. Age indeterminate. 3. Large anterior communicating artery providing collateral circulation to the left MCA  and left PCA distributions. Decreased signal in left MCA and left PCA distribution from increased transit time. CTA of the head and neck can better assess vessel stenosis and patency. If clinically indicated consider CT perfusion of the head to assess for brain oligemia. These results  will be called to the ordering clinician or representative by the Radiologist Assistant, and communication documented in the PACS or zVision Dashboard. Electronically Signed   By: Kristine Garbe M.D.   On: 06/06/2018 18:14   Ct Angio Chest Aorta W And/or Wo Contrast  Result Date: 05/28/2018 CLINICAL DATA:  Centralized non radiating chest pain beginning last evening. Recent spinal surgery. Evaluate for pulmonary embolism. EXAM: CT ANGIOGRAPHY CHEST WITH CONTRAST TECHNIQUE: Multidetector CT imaging of the chest was performed using the standard protocol during bolus administration of intravenous contrast. Multiplanar CT image reconstructions and MIPs were obtained to evaluate the vascular anatomy. CONTRAST:  132m ISOVUE-370 IOPAMIDOL (ISOVUE-370) INJECTION 76% COMPARISON:  Chest radiograph - 05/28/2018 FINDINGS: Vascular Findings: There is adequate opacification of the pulmonary arterial system with the main pulmonary artery measuring 1,251 Hounsfield units. There are no discrete filling defects within the pulmonary arterial tree to suggest pulmonary embolism. Enlarged caliber of the main pulmonary artery measuring 35 mm in diameter. Cardiomegaly.  No pericardial effusion. Minimal amount of atherosclerotic plaque within a normal caliber thoracic aorta. Conventional configuration of the aortic arch. The branch vessels of the aortic arch appear patent throughout their imaged course. Review of the MIP images confirms the above findings. ---------------------------------------------------------------------------------- Nonvascular Findings: Mediastinum/Lymph Nodes: Potential mild circumferential wall thickening of the esophagus (representative image 46, series 6), potentially accentuated due to underdistention though conceivably esophagitis could have a similar appearance. No bulky mediastinal, hilar or axillary lymphadenopathy. Lungs/Pleura: Minimal dependent subpleural ground-glass atelectasis. No discrete  focal airspace opacities. No pleural effusion or pneumothorax. The central pulmonary airways appear widely patent. No discrete pulmonary nodules. Upper abdomen: Limited early arterial phase evaluation of the upper abdomen demonstrates mild thickening of the left adrenal gland without discrete nodule. Musculoskeletal: Regional soft tissues appear normal. There is a punctate (approximately 0.6 cm) hypoattenuating nodule within the inferior medial aspect the left lobe of the thyroid (image 23, series 6), of doubtful clinical concern. IMPRESSION: 1. Suspected circumferential esophageal wall thickening, potentially accentuated due to underdistention though potentially and esophagitis could have a similar appearance. Clinical correlation is advised. 2. Otherwise, no explanation for patient's centralized non radiating chest pain. Specifically, no evidence of pulmonary embolism. 3. Cardiomegaly with enlargement of the caliber the main pulmonary artery, nonspecific though could be seen in the setting of pulmonary arterial hypertension. Further evaluation cardiac echo could be performed as indicated. 4. Aortic Atherosclerosis (ICD10-I70.0). Electronically Signed   By: JSandi MariscalM.D.   On: 05/28/2018 17:04   Ct Head Code Stroke Wo Contrast  Result Date: 06/06/2018 CLINICAL DATA:  Code stroke. Initial evaluation for acute altered mental status, right-sided weakness. EXAM: CT HEAD WITHOUT CONTRAST TECHNIQUE: Contiguous axial images were obtained from the base of the skull through the vertex without intravenous contrast. COMPARISON:  Prior CT from 07/16/2007. FINDINGS: Brain: Cerebral volume within normal limits for age. Patchy hypodensity within the periventricular and deep white matter both cerebral hemispheres, nonspecific, but most like related chronic small vessel ischemic disease. No acute intracranial hemorrhage. There is subtle loss of gray-white matter differentiation involving the anterior/mid left temporal lobe,  suspicious for possible developing acute ischemic left MCA territory infarct (series 3, image 15 on axial view, series 6, image 52 on  sagittal sequence. This corresponds with M2 region. Gray-white matter differentiation otherwise grossly maintained. No mass lesion, midline shift or mass effect. No hydrocephalus. No extra-axial fluid collection. Vascular: No hyperdense vessel. Scattered vascular calcifications noted within the carotid siphons. Skull: Scalp soft tissues and calvarium within normal limits. Postoperative changes from prior posterior decompression and laminectomy noted at the upper cervical spine with probable associated postoperative seroma. Sinuses/Orbits: Globes and orbital soft tissues within normal limits. Chronic mucosal thickening within the right maxillary sinus. Paranasal sinuses are otherwise largely clear. No mastoid effusion. Other: None. ASPECTS [facility] Stroke Program Early CT Score) - Ganglionic level infarction (caudate, lentiform nuclei, internal capsule, insula, M1-M3 cortex): 6 - Supraganglionic infarction (M4-M6 cortex): 3 Total score (0-10 with 10 being normal): 9 IMPRESSION: 1. Subtle hypodensity involving the anterior/mid left temporal lobe, suspicious for evolving acute ischemic left MCA territory infarct. No associated hemorrhage. 2. ASPECTS is 9. 3. Chronic microvascular ischemic disease. These results were communicated to Dr. Cheral Marker At 5:48 amon 9/6/2019by text page via the [facility] messaging system. Electronically Signed   By: Jeannine Boga M.D.   On: 06/06/2018 05:51      Phillips Climes M.D on 06/16/2018 at 3:21 PM  Between 7am to 7pm - Pager - [phone]  After 7pm go to [email or web address] - password [facility]  [facility] -  Office  [phone]

## 2018-06-16 NOTE — Progress Notes (Signed)
  Speech Language Pathology Treatment: Dysphagia  Patient Details Name: Ian Ramos MRN: [id number] DOB: 1946-05-14 Today's Date: 06/16/2018 Time: 7681-1572 SLP Time Calculation (min) (ACUTE ONLY): 20 min  Assessment / Plan / Recommendation Clinical Impression  Pt seen following MBS and diet initiation 9/13; cognition addressed as well. Exhibits continued confusion, mildly reduced sustained attentionand decreased awareness to situation and surroundings, thinks he is in his apartment. SLP utilized environmental cues to assist in orientation with white board, IV poles and tele sitter etc. Exhibited a decreased sense of proprioception, continuously holding bed rail and stating he felt as if he was falling. Pt repositioned and provided reassurance. He consumed cup sips honey thick orange juice with residue on mustache but overall timely transit. One delayed cough following initial trial pill in applesauce out of 4 trials; no s/s aspiration noted with honey thick liquids. He required assist to hold cup. He will continue to need full supervision and assist with feeds; ST will continue intervention.    HPI HPI: Ian Ramos is a 72 y.o. male with medical history significant for chronic kidney disease stage III, neuromuscular disorder, cervical stenosis with myelopathy status post cervical laminectomy on 05/23/2018, presented to the emergency department for evaluation of chest pain. Barium esophagram 05/30/18 moderate esophageal dysmotility. MRI is showing acute/early subacute infarcts:  2 in left superiour frontal lobe and 2 in the right posterolateral parietal lobe. CXR No acute abnormality noted. SLE performed 9/8 with recommendation for continued ST. On 9/9 pt found to be unresponsive, not breathign and code blue called. Pt intubated 9/9-9/12      SLP Plan  Continue with current plan of care       Recommendations  Diet recommendations: Dysphagia 2 (fine chop);Honey-thick liquid Liquids  provided via: Cup Medication Administration: Whole meds with puree Supervision: Staff to assist with self feeding;Full supervision/cueing for compensatory strategies Compensations: Minimize environmental distractions;Slow rate;Small sips/bites;Lingual sweep for clearance of pocketing Postural Changes and/or Swallow Maneuvers: Seated upright 90 degrees                Oral Care Recommendations: Oral care BID Follow up Recommendations: Skilled Nursing facility SLP Visit Diagnosis: Dysphagia, pharyngeal phase (R13.13) Plan: Continue with current plan of care       GO                Royce Macadamia 06/16/2018, 10:20 AM  Breck Coons Lonell Face.Ed Nurse, children's [phone] Office [phone]

## 2018-06-17 LAB — GLUCOSE, CAPILLARY
GLUCOSE-CAPILLARY: 104 mg/dL — AB (ref 70–99)
GLUCOSE-CAPILLARY: 137 mg/dL — AB (ref 70–99)
GLUCOSE-CAPILLARY: 156 mg/dL — AB (ref 70–99)
GLUCOSE-CAPILLARY: 95 mg/dL (ref 70–99)
Glucose-Capillary: 103 mg/dL — ABNORMAL HIGH (ref 70–99)
Glucose-Capillary: 104 mg/dL — ABNORMAL HIGH (ref 70–99)

## 2018-06-17 LAB — BASIC METABOLIC PANEL
Anion gap: 8 (ref 5–15)
BUN: 29 mg/dL — AB (ref 8–23)
CALCIUM: 8 mg/dL — AB (ref 8.9–10.3)
CHLORIDE: 107 mmol/L (ref 98–111)
CO2: 23 mmol/L (ref 22–32)
Creatinine, Ser: 1.44 mg/dL — ABNORMAL HIGH (ref 0.61–1.24)
GFR calc non Af Amer: 47 mL/min — ABNORMAL LOW (ref >60)
GFR, EST AFRICAN AMERICAN: 54 mL/min — AB (ref >60)
Glucose, Bld: 107 mg/dL — ABNORMAL HIGH (ref 70–99)
Potassium: 4 mmol/L (ref 3.5–5.1)
SODIUM: 138 mmol/L (ref 135–145)

## 2018-06-17 LAB — CBC
HCT: 24.9 % — ABNORMAL LOW (ref 39.0–52.0)
HEMOGLOBIN: 7.8 g/dL — AB (ref 13.0–17.0)
MCH: 28.7 pg (ref 26.0–34.0)
MCHC: 31.3 g/dL (ref 30.0–36.0)
MCV: 91.5 fL (ref 78.0–100.0)
Platelets: 200 10*3/uL (ref 150–400)
RBC: 2.72 MIL/uL — ABNORMAL LOW (ref 4.22–5.81)
RDW: 12.2 % (ref 11.5–15.5)
WBC: 6.4 10*3/uL (ref 4.0–10.5)

## 2018-06-17 MED ORDER — ACETAMINOPHEN 325 MG PO TABS
325.0000 mg | ORAL_TABLET | Freq: Once | ORAL | Status: AC
  Administered 2018-06-17: 325 mg via ORAL
  Filled 2018-06-17: qty 1

## 2018-06-17 NOTE — Progress Notes (Signed)
Inpatient Rehabilitation-Admissions Coordinator   [facility] has begun insurance authorization for possible admit to CIR. Please call if questions.   [name], OTR/L  Rehab Admissions Coordinator  [phone] 06/17/2018 2:33 PM

## 2018-06-17 NOTE — Progress Notes (Addendum)
PROGRESS NOTE                                                                                                                                                                                                             Patient Demographics:    Ian Ramos, is a 72 y.o. male, DOB - 1946-02-09, LXB:[id number]  Admit date - 05/28/2018   Admitting Physician Ian Bulls, MD  Outpatient Primary MD for the patient is Ian Maw, MD  LOS - 13   Chief Complaint  Patient presents with  . Chest Pain       Brief Narrative    72 year old male patient history of stage III chronic kidney disease, cervical spine stenosis status post recent cervical laminectomy August 2019.  Presented to the emergency room on 8/28 with chief complaint of chest pain, shoulder pain and substernal discomfort..  Was found to have some esophageal thickening, possibly some esophageal dysmotility, etiology was never clearly identified but resolved.  He was found to have new dilated cardiomyopathy with EF 35% and diffuse hypokinesis, and bundle branch block, but it was unclear as to whether or not if this was the primary cause of his discomfort.  The plan was to proceed with iliac ischemia evaluation.  He underwent cardiac catheterization on 9/5: Showed moderate obstructive coronary artery disease with 75% occluded proximal LAD 75% OM 2 occluded RV marginal branch with left to right collaterals.  This was not considered critical CAD and medical management was recommended. On 9/6 the patient developed acute mental status change.  On initial eval he was confused, preservation, and had right upper extremity drift and right visual field cut.  CT brain history left temporal lobe infarct felt possibly embolic in etiology.  Carotid ultrasound showed likely left carotid occlusion with high-grade right carotid stenosis.  Vascular surgery was consulted.  Plavix was recommended and delaying  carotid endarterectomy for further recovery was felt best course of action. 9/7: Continues to demonstrate aphasia.  Also with ongoing left upper extremity weakness.  MRI showed right MCA/PCA watershed and right parietal punctate infarcts felt more consistent with high-grade ICA stenosis on the left. 9/8: Patient vomited twice during the day while family was assisting with feeding 9/9: Found unresponsive in chair.  Blood pressure 48/32 heart rate 58 area not clear that he lost pulses.  Pulse  oximetry not recorded.  He was apneic, Code blue was called patient was intubated and transferred to the intensive care.  Consults: GI 8/29: Found asymptomatic esophagitis.  Also evidence of esophageal dysmotility on esophagram.  GI signed off on 8/30 with final recommendations for daily PPI x4 weeks and routine cancer screening Cardiology consulted 9/3 for chest pain.  Underwent left heart cath which showed diffuse coronary artery disease but noncritical recommended medical therapy, because he had bradycardia and CKD ARB was held he was recommended to continue hydralazine, nitrates and diuretics 9/7: Neurology consulted for stroke Vascular surgery consulted 9/6: For bilateral carotid artery disease: Started on Plavix.  Recommending gentle hydration.  Initial recommendation for stenting within 2 weeks, but given particular intubation, current recommendation is for carotid stenting within 4 to 6 weeks(note was on 06/10/2018)    Subjective:    Ian Ramos today has, No headache, No chest pain, No abdominal pain - No Nausea, feeding tube was discontinued yesterday, improving oral intake  Assessment  & Plan :    Principal Problem:   Intractable pain Active Problems:   Cervical myelopathy (HCC)   Chest pain   CKD (chronic kidney disease), stage III (HCC)   Esophageal thickening   LBBB (left bundle branch block)   Postoperative anemia   DCM (dilated cardiomyopathy) (HCC)   Acute systolic CHF (congestive  heart failure) (HCC)   Coronary artery disease due to lipid rich plaque   Cerebrovascular accident (CVA) due to bilateral stenosis of carotid arteries ([name])   On mechanically assisted ventilation ([place])   Acute respiratory failure ([place])   Essential hypertension   Acute encephalopathy   Acute hypoxemic respiratory failure -  requiring intubation and mechanical ventilation. Unclear at this time what the etiology of his respiratory arrest was. - extubated, significantly improving, encouraged to use incentive spirometry and flutter valve, currently on room air.    Acute bilateral CVA: -  Initially felt to be embolic, though with critical bilateral carotid stenosis and hypotension as well as watershed distribution of most infarcts, ICA stenosis is felt to be etiology. Locations include periventricular left M1/M2 distribution, right MCA/PCA distribution, and right temporoparietal cortex with resultant confusion and aphasia.   - On antiplatelet per neurology, statin - HbA1c 6.1%,  Bilateral carotid artery stenosis: Right ICA severely occluded (80-99%) and left ICA said to be completely occluded.  - Ideally 130-141mHg SBP  - ASA, plavix, statin  -Please see discussion below regarding carotid artery stenosis -Continue with PT/OT, TIR consulted -He is followed by vascular surgery, plan for repeat stenting when he improved, very likely after rehab stay  Cardiomyopathy with LVEF 35 to 40% range and diffuse hypokinesis by echocardiogram. -Management per cardiology, with known history of CAD, left cardiac cath 06/05/2018 significant for eccentric 75% proximal LAD stenosis, 75% OM 2 stenosis, and occluded RV marginal branch with left-to-right collaterals.  Plan is medical therapy at per cardiology, noted on beta-blockers, already on clonidine patch, on aspirin, Plavix and statin, currently not on ACE or arms given his renal failure, but given renal function back to baseline, cardiology considering to  resume soon,   Cervical myelopathy status post laminectomy -By PT/OT, recommendation for CIR -Wound looks clean  Anemia of chronic disease -Need to monitor and transfuse as needed, hemoglobin is seven-point 8 repeat CBC in a.m.  Acute on chronic renal insufficiency -Continuing back to baseline, it is 1.39 today  Acute encephalopathy, acute delirium: -Multifactorial, in the setting of CVA, recent surgery, ICU delirium and stay, prolonged  hospitalization, appears to be improving. -Diet has been advanced by SLP, discontinued his feeding tube yesterday and tube feed yesterday, oral intake has improved, but he does not drink enough fluids as it is thickened, will continue with IV fluids today, started on some supplements as well .  Urinary retention. - Foley catheter discontinued, no recurrence of retention  Hypertension -Continue with clonidine patch, he was started on low-dose Coreg by cardiology as well  Code Status : Full  Family Communication  : none at bedside  Disposition Plan  : CIR   Consults  : Cardiology, neurology, PCCM vascular surgery  Procedures  :  Left heart cath 9/5 showing moderate noncritical coronary artery disease.  75% occluded proximal LAD 75% occluded OM.  Medical management was recommended  Significant Diagnostic Tests: Echocardiogram 8/29:EF 35 to 40%.  Diffuse hypokinesis.  Doppler parameters consistent with grade 1 diastolic dysfunction. 9/6: MRI brainExtensive motion artifact.  Minimal flow related signal in the left cervical ICA and absent flow of the left ICA representing occlusion or proximal high-grade stenosis.  There is a large anterior communicating artery providing collateral circulation to the left MCA and left PCA. 9/6: CT brain: Subtle hypodensity in the anterior mid left temporal lobe 9/4: Nuclear stress test EF 39% findings were consistent with ischemia felt to be intermittent risk 8/30: Esophagram showing moderate esophageal  dysmotility 9/12 extubated, confused  DVT Prophylaxis  :  lovenox  Lab Results  Component Value Date   PLT 200 06/17/2018    Antibiotics  :    Anti-infectives (From admission, onward)   None        Objective:   Vitals:   06/17/18 0912 06/17/18 0940 06/17/18 1154 06/17/18 1429  BP: 136/63 136/63 109/60 (!) 107/56  Pulse:  64 64 72  Resp:   18 20  Temp:   98.4 F (36.9 C) 97.7 F (36.5 C)  TempSrc:   Oral Oral  SpO2:   97% 99%  Weight:      Height:        Wt Readings from Last 3 Encounters:  06/17/18 78.4 kg  05/23/18 79.4 kg  05/16/18 79.4 kg     Intake/Output Summary (Last 24 hours) at 06/17/2018 1449 Last data filed at 06/17/2018 0900 Gross per 24 hour  Intake 1016.63 ml  Output 1100 ml  Net -83.37 ml     Physical Exam  A awake Alert, Oriented X 2, remains confused, but more appropriate and coherent today, has left upper extremity weakness symmetrical Chest wall movement, Good air movement bilaterally, CTAB RRR,No Gallops,Rubs or new Murmurs, No Parasternal Heave +ve B.Sounds, Abd Soft, No tenderness, No rebound - guarding or rigidity. No Cyanosis, Clubbing or edema, No new Rash or bruise      Data Review:    CBC Recent Labs  Lab 06/11/18 0302 06/12/18 0431 06/13/18 0435 06/14/18 0214 06/15/18 0719 06/17/18 0441  WBC 11.1* 7.5 8.0 6.1 6.8 6.4  HGB 10.0* 9.1* 8.6* 8.1* 8.9* 7.8*  HCT 30.5* 28.3* 26.2* 24.9* 27.3* 24.9*  PLT 146* 128* 160 165 173 200  MCV 91.0 91.0 89.4 91.2 90.7 91.5  MCH 29.9 29.3 29.4 29.7 29.6 28.7  MCHC 32.8 32.2 32.8 32.5 32.6 31.3  RDW 12.0 12.0 12.1 12.6 12.4 12.2  LYMPHSABS 1.1  --   --   --   --   --   MONOABS 0.9  --   --   --   --   --   EOSABS 0.1  --   --   --   --   --  BASOSABS 0.0  --   --   --   --   --     Chemistries  Recent Labs  Lab 06/12/18 0431 06/12/18 1648 06/13/18 0435 06/14/18 0214 06/15/18 0719 06/16/18 0625 06/17/18 0441  NA 136  --  139 137 136 138 138  K 4.1  --  3.7 3.8 4.0  4.2 4.0  CL 109  --  112* 108 108 107 107  CO2 20*  --  21* 22 21* 22 23  GLUCOSE 257*  --  153* 133* 146* 98 107*  BUN 28*  --  34* 33* 28* 24* 29*  CREATININE 1.38*  --  1.25* 1.18 1.22 1.39* 1.44*  CALCIUM 8.0*  --  7.6* 7.7* 7.6* 8.1* 8.0*  MG 1.7 2.0 2.1 1.6*  --  1.7  --    ------------------------------------------------------------------------------------------------------------------ No results for input(s): CHOL, HDL, LDLCALC, TRIG, CHOLHDL, LDLDIRECT in the last 72 hours.  Lab Results  Component Value Date   HGBA1C 6.1 (H) 06/06/2018   ------------------------------------------------------------------------------------------------------------------ No results for input(s): TSH, T4TOTAL, T3FREE, THYROIDAB in the last 72 hours.  Invalid input(s): FREET3 ------------------------------------------------------------------------------------------------------------------ No results for input(s): VITAMINB12, FOLATE, FERRITIN, TIBC, IRON, RETICCTPCT in the last 72 hours.  Coagulation profile No results for input(s): INR, PROTIME in the last 168 hours.  No results for input(s): DDIMER in the last 72 hours.  Cardiac Enzymes No results for input(s): CKMB, TROPONINI, MYOGLOBIN in the last 168 hours.  Invalid input(s): CK ------------------------------------------------------------------------------------------------------------------ No results found for: BNP  Inpatient Medications  Scheduled Meds: .  stroke: mapping our early stages of recovery book   Does not apply Once  . aspirin  162 mg Per Tube Daily  . atorvastatin  80 mg Per Tube q1800  . carvedilol  3.125 mg Oral BID WC  . chlorhexidine gluconate (MEDLINE KIT)  15 mL Mouth Rinse BID  . cloNIDine  0.1 mg Transdermal Weekly  . clopidogrel  75 mg Per Tube Daily  . enoxaparin (LOVENOX) injection  40 mg Subcutaneous Q24H  . feeding supplement (ENSURE ENLIVE)  237 mL Oral BID BM  . feeding supplement (PRO-STAT SUGAR  FREE 64)  30 mL Oral BID  . insulin aspart  0-15 Units Subcutaneous Q4H  . iopamidol  100 mL Intravenous Once  . mouth rinse  15 mL Mouth Rinse BID  . sodium chloride flush  3 mL Intravenous Q12H   Continuous Infusions: . sodium chloride 10 mL/hr at 06/09/18 1610  . sodium chloride 75 mL/hr at 06/17/18 0719  . famotidine (PEPCID) IV Stopped (06/16/18 1849)  . feeding supplement (VITAL AF 1.2 CAL) Stopped (06/15/18 1051)   PRN Meds:.sodium chloride, acetaminophen, acetaminophen, fentaNYL (SUBLIMAZE) injection, RESOURCE THICKENUP CLEAR, sodium chloride flush  Micro Results Recent Results (from the past 240 hour(s))  Culture, respiratory (non-expectorated)     Status: None   Collection Time: 06/09/18  4:48 PM  Result Value Ref Range Status   Specimen Description TRACHEAL ASPIRATE  Final   Special Requests NONE  Final   Gram Stain   Final    RARE WBC PRESENT, PREDOMINANTLY PMN FEW GRAM POSITIVE COCCI FEW GRAM POSITIVE RODS    Culture   Final    Consistent with normal respiratory flora. Performed at [facility], [place] [address] [place], [place] [address]    Report Status 06/11/2018 FINAL  Final    Radiology Reports Dg Chest 2 View  Result Date: 05/28/2018 CLINICAL DATA:  Chest pain and weakness for a few days. The  patient underwent cervical fusion 05/23/2018. EXAM: CHEST - 2 VIEW COMPARISON:  PA and lateral chest 05/26/2018. FINDINGS: Lung volumes are low with minimal atelectasis in the left base. No pneumothorax or pleural effusion. Heart size is normal. No acute or focal bony abnormality. Degenerative disease about the shoulders noted. IMPRESSION: No acute disease. Electronically Signed   By: Inge Rise M.D.   On: 05/28/2018 11:57   Dg Chest 2 View  Result Date: 05/26/2018 CLINICAL DATA:  72 year old male with weakness and cough. Status post recent cervical spine surgery. EXAM: CHEST - 2 VIEW COMPARISON:  None. FINDINGS: Cardiac and mediastinal contours are within  normal limits. Atherosclerotic calcifications are present within the transverse aorta. Mild linear opacity in the lingula most consistent with atelectasis. No evidence of pulmonary edema, pleural effusion or pneumothorax. No suspicious pulmonary nodule or mass. Degenerative changes are present in both shoulders including the acromioclavicular joints and glenohumeral joints. High-riding humeral heads bilaterally with decreased subacromial space consistent with chronic rotator cuff injuries. No acute osseous abnormality. IMPRESSION: 1. Linear opacity in the lingula most consistent with atelectasis. 2.  Aortic Atherosclerosis (ICD10-170.0) 3. Bilateral degenerative changes in the glenohumeral and acromioclavicular joints. Electronically Signed   By: Jacqulynn Cadet M.D.   On: 05/26/2018 08:01   Dg Cervical Spine 2-3 Views  Result Date: 05/23/2018 CLINICAL DATA:  C1-C6 decompression and fusion. EXAM: CERVICAL SPINE - 2-3 VIEW; DG C-ARM 61-120 MIN COMPARISON:  MRI 01/01/2018 FINDINGS: C-arm images show posterior fusion with lateral mass screws and posterior rods from C1 through C6. No radiographically detectable complication on this limited imaging. IMPRESSION: Posterior fusion from C1 through C6. Electronically Signed   By: Nelson Chimes M.D.   On: 05/23/2018 16:26   Ct Cervical Spine Wo Contrast  Result Date: 05/28/2018 CLINICAL DATA:  Chest pain.  Recent cervical spine surgery. EXAM: CT CERVICAL SPINE WITHOUT CONTRAST TECHNIQUE: Multidetector CT imaging of the cervical spine was performed without intravenous contrast. Multiplanar CT image reconstructions were also generated. COMPARISON:  MRI cervical spine January 01, 2018 FINDINGS: ALIGNMENT: Straightened cervical lordosis.  No malalignment. SKULL BASE AND VERTEBRAE: Vertebral bodies intact. Interval C1 through C6 laminectomies. Intact well-seated lateral mass screws. The RIGHT C3, C4, C5 and C6 screws are intra-articular. The LEFT C3 through C6 facet screws  are intra-articular, the LEFT facet screw extends into the foramen transversarium. The LEFT C6 screw extends into the cephalad aspect of the foramen transversarium. Intact hardware. Non incorporated posterior bone graft material. Stable moderate to severe disc height loss C3-4 through T1-2 with endplate sclerosis and marginal spurring compatible with degenerative discs. Stable geographic subcentimeter cyst and posterior odontoid process without cortical disruption. 1 cm calcified pannus posterior to the odontoid process seen with CPPD without destructive bony changes. SOFT TISSUES AND SPINAL CANAL: And gas within the surgical bed and ventral epidural space at C1-2 consistent with recent surgery. Small prevertebral effusion is likely postoperative. Mild calcific atherosclerosis aortic arch. DISC LEVELS: Canal obscured by hardware streak artifact. Neural foraminal narrowing all cervical levels varying from moderate to severe. UPPER CHEST: Lung apices are clear. OTHER: None. IMPRESSION: 1. Status post recent C1 through C6 laminectomies and posterior instrumentation. Multilevel intra-articular facet screws, LEFT C2 screw in foramen transversarium. 2. Postoperative fluid collection and gas consistent with recent surgery/seroma, pseudomeningocele not excluded. If there is clinical concern for infection, consider sampling. 3. No fracture deformity or malalignment. Aortic Atherosclerosis (ICD10-I70.0). Electronically Signed   By: Elon Alas M.D.   On: 05/28/2018 16:32   Mr  Brain Wo Contrast  Result Date: 06/06/2018 CLINICAL DATA:  72 y/o M; acute altered mental status and right-sided weakness. Stroke evaluation. EXAM: MRI HEAD WITHOUT CONTRAST MRA HEAD WITHOUT CONTRAST TECHNIQUE: Axial DWI, coronal DWI, axial T2 FLAIR sequences of the head were acquired. Angiographic images of the head were obtained using MRA technique without contrast. The patient was unable to continue and additional sequences were not acquired.  COMPARISON:  06/06/2018 CT head.  05/28/2018 CT cervical spine. FINDINGS: MRI HEAD FINDINGS Brain: Subcentimeter foci of reduced diffusion compatible with acute/early subacute infarction are present, 2 in the left superior frontal lobe, and 2 in the right posterior lateral parietal lobe. No signal abnormality in the left anterior temporal lobe, findings on CT likely related to artifact. Several nonspecific T2 FLAIR hyperintensities in subcortical and periventricular white matter are compatible with mild chronic microvascular ischemic changes for age. Mild volume loss of the brain. No hydrocephalus, extra-axial collection, or herniation. Vascular: As below. Skull and upper cervical spine: Partially visualized cervical spinal fusion and laminectomy. Large fluid collection within the postoperative bed as seen on prior CT of the cervical spine. No reduced diffusion of the visible portions of the fluid collection to indicate infection. Sinuses/Orbits: Negative. Other: None. MRA HEAD FINDINGS Motion degraded study. Anterior circulation: Minimal flow related signal within the left cervical internal carotid artery and no appreciable flow related signal of the left petrous and cavernous segments of the left internal carotid artery. Faint flow related signal within the terminal left ICA, probably retrograde. Right internal carotid artery is patent, moderate to severe stenosis of the right cavernous segment. Normal flow related signal within the right MCA and bilateral ACA distributions. Large anterior communicating artery largely perfusing the left MCA distribution which is patent. Asymmetry in the MCA distribution signal, left less than right, is likely due to signal loss from delayed transit via A-comm collateralization. Posterior circulation: Faint flow related signal in the left posterior cerebral artery which appears to be a fetal PCA is likely due to signal loss from delayed transit due to A-comm collateralization from  the right ICA. Normal flow related signal within the vertebral arteries, basilar artery, and right PCA. IMPRESSION: MRI head: 1. Diffusion and T2 FLAIR weighted sequences were acquired. The patient was unable to continue and additional sequences were not acquired. 2. Subcentimeter acute/early subacute infarctions, 2 in the left superior frontal lobe, and 2 in the right posterolateral parietal lobe. No associated mass effect. 3. Mild chronic microvascular ischemic changes and volume loss of the brain. MRA head: 1. Extensive motion artifact. 2. Minimal flow related signal in the left cervical ICA and absent flow related signal within the petrous and cavernous segments of left ICA. Findings may represent occlusion or proximal high-grade stenosis. Age indeterminate. 3. Large anterior communicating artery providing collateral circulation to the left MCA and left PCA distributions. Decreased signal in left MCA and left PCA distribution from increased transit time. CTA of the head and neck can better assess vessel stenosis and patency. If clinically indicated consider CT perfusion of the head to assess for brain oligemia. These results will be called to the ordering clinician or representative by the Radiologist Assistant, and communication documented in the PACS or zVision Dashboard. Electronically Signed   By: Kristine Garbe M.D.   On: 06/06/2018 18:14   Dg Esophagus  Result Date: 05/30/2018 CLINICAL DATA:  Esophageal wall thickening on CT EXAM: ESOPHOGRAM/BARIUM SWALLOW TECHNIQUE: Single contrast examination was performed using  thin barium. FLUOROSCOPY TIME:  Fluoroscopy Time:  42 seconds Radiation Exposure Index (if provided by the fluoroscopic device): 12.5 mGy Number of Acquired Spot Images: 8 COMPARISON:  CTA chest dated 05/28/2018 FINDINGS: Limited evaluation due to difficulty with patient positioning and inability to perform double-contrast evaluation. Single contrast evaluation in the recumbent  supine position was performed. No mucosal irregularity to suggest esophageal mass. No fixed esophageal narrowing or stricture. Moderate esophageal dysmotility. No evidence of hiatal hernia. Visualized stomach is grossly unremarkable. IMPRESSION: Moderate esophageal dysmotility. Otherwise negative single-contrast esophagram. Electronically Signed   By: Julian Hy M.D.   On: 05/30/2018 09:30   Nm Myocar Multi W/spect W/wall Motion / Ef  Result Date: 06/04/2018  There was no ST segment deviation noted during stress.  Defect 1: There is a medium defect of moderate severity present in the basal anteroseptal and mid anteroseptal location.  Defect 2: There is a medium defect of severe severity present in the basal inferior and mid inferior location.  This is an intermediate risk study.  Findings consistent with ischemia.  Nuclear stress EF: 39%.  The left ventricular ejection fraction is moderately decreased (30-44%).  Abnormal, intermediate risk stress nuclear study with predominantly fixed anteroseptal defect possibly secondary to left bundle branch block; mild to moderate inferior ischemia; gated ejection fraction 39% with hypokinesis of the septum, mild left ventricular enlargement.   Dg Chest Port 1 View  Result Date: 06/12/2018 CLINICAL DATA:  Respiratory failure EXAM: PORTABLE CHEST 1 VIEW COMPARISON:  06/09/2018 FINDINGS: Endotracheal tube and left subclavian central line are again noted and stable. Feeding catheter has been placed and courses towards the stomach. Previously seen nasogastric catheter has been removed. Postsurgical changes in the cervical spine are noted. Cardiac shadow is stable. Lungs are well aerated bilaterally without focal infiltrate or sizable effusion. IMPRESSION: Tubes and lines as described above. No acute abnormality noted. Electronically Signed   By: Inez Catalina M.D.   On: 06/12/2018 09:59   Dg Chest Port 1 View  Result Date: 06/09/2018 CLINICAL DATA:  Central  line placement confirmation EXAM: PORTABLE CHEST 1 VIEW COMPARISON:  06/09/2018 1645 hours FINDINGS: Endotracheal and NG tubes stable. Left subclavian central venous catheter placed. Tip is in the mid SVC. There is no ensuing left pneumothorax. Normal heart size. Clear and under aerated lungs. IMPRESSION: Left subclavian central venous catheter placement with its tip in the mid SVC and no pneumothorax per Clear lungs. Stable endotracheal and NG tubes. Electronically Signed   By: Marybelle Killings M.D.   On: 06/09/2018 18:09   Dg Chest Port 1 View  Result Date: 06/09/2018 CLINICAL DATA:  72 year old male with acute respiratory failure. Subsequent encounter. EXAM: PORTABLE CHEST 1 VIEW COMPARISON:  05/28/2018 CT and chest x-ray. FINDINGS: Endotracheal tube tip centrally located 4.9 cm above the carina. Nasogastric tube tip gastric fundus level. No infiltrate or congestive heart failure. No obvious pneumothorax. Heart size within normal limits. Calcified mildly tortuous aorta. IMPRESSION: 1. No infiltrate or congestive heart failure. 2.  Aortic Atherosclerosis (ICD10-I70.0). Electronically Signed   By: Genia Del M.D.   On: 06/09/2018 16:57   Dg Abd Portable 1v  Result Date: 06/11/2018 CLINICAL DATA:  Feeding tube placement EXAM: PORTABLE ABDOMEN - 1 VIEW COMPARISON:  06/10/2018 FINDINGS: Feeding tube has been placed with the tip in the distal descending duodenum. Nonobstructive bowel gas pattern. IMPRESSION: Feeding tube in the distal descending duodenum. Electronically Signed   By: Rolm Baptise M.D.   On: 06/11/2018 11:12   Dg Abd Portable 1v  Result Date: 06/10/2018  CLINICAL DATA:  Status post OG tube placement. EXAM: PORTABLE ABDOMEN - 1 VIEW COMPARISON:  None. FINDINGS: OG tube is in place with the tip just within the stomach. Side port is in the distal esophagus. Recommend advancement of 5-6 cm. IMPRESSION: As above. Electronically Signed   By: Inge Rise M.D.   On: 06/10/2018 10:27   Dg C-arm  1-60 Min  Result Date: 05/23/2018 CLINICAL DATA:  C1-C6 decompression and fusion. EXAM: CERVICAL SPINE - 2-3 VIEW; DG C-ARM 61-120 MIN COMPARISON:  MRI 01/01/2018 FINDINGS: C-arm images show posterior fusion with lateral mass screws and posterior rods from C1 through C6. No radiographically detectable complication on this limited imaging. IMPRESSION: Posterior fusion from C1 through C6. Electronically Signed   By: Nelson Chimes M.D.   On: 05/23/2018 16:26   Mr Jodene Nam Head Wo Contrast  Result Date: 06/06/2018 CLINICAL DATA:  72 y/o M; acute altered mental status and right-sided weakness. Stroke evaluation. EXAM: MRI HEAD WITHOUT CONTRAST MRA HEAD WITHOUT CONTRAST TECHNIQUE: Axial DWI, coronal DWI, axial T2 FLAIR sequences of the head were acquired. Angiographic images of the head were obtained using MRA technique without contrast. The patient was unable to continue and additional sequences were not acquired. COMPARISON:  06/06/2018 CT head.  05/28/2018 CT cervical spine. FINDINGS: MRI HEAD FINDINGS Brain: Subcentimeter foci of reduced diffusion compatible with acute/early subacute infarction are present, 2 in the left superior frontal lobe, and 2 in the right posterior lateral parietal lobe. No signal abnormality in the left anterior temporal lobe, findings on CT likely related to artifact. Several nonspecific T2 FLAIR hyperintensities in subcortical and periventricular white matter are compatible with mild chronic microvascular ischemic changes for age. Mild volume loss of the brain. No hydrocephalus, extra-axial collection, or herniation. Vascular: As below. Skull and upper cervical spine: Partially visualized cervical spinal fusion and laminectomy. Large fluid collection within the postoperative bed as seen on prior CT of the cervical spine. No reduced diffusion of the visible portions of the fluid collection to indicate infection. Sinuses/Orbits: Negative. Other: None. MRA HEAD FINDINGS Motion degraded study.  Anterior circulation: Minimal flow related signal within the left cervical internal carotid artery and no appreciable flow related signal of the left petrous and cavernous segments of the left internal carotid artery. Faint flow related signal within the terminal left ICA, probably retrograde. Right internal carotid artery is patent, moderate to severe stenosis of the right cavernous segment. Normal flow related signal within the right MCA and bilateral ACA distributions. Large anterior communicating artery largely perfusing the left MCA distribution which is patent. Asymmetry in the MCA distribution signal, left less than right, is likely due to signal loss from delayed transit via A-comm collateralization. Posterior circulation: Faint flow related signal in the left posterior cerebral artery which appears to be a fetal PCA is likely due to signal loss from delayed transit due to A-comm collateralization from the right ICA. Normal flow related signal within the vertebral arteries, basilar artery, and right PCA. IMPRESSION: MRI head: 1. Diffusion and T2 FLAIR weighted sequences were acquired. The patient was unable to continue and additional sequences were not acquired. 2. Subcentimeter acute/early subacute infarctions, 2 in the left superior frontal lobe, and 2 in the right posterolateral parietal lobe. No associated mass effect. 3. Mild chronic microvascular ischemic changes and volume loss of the brain. MRA head: 1. Extensive motion artifact. 2. Minimal flow related signal in the left cervical ICA and absent flow related signal within the petrous and cavernous segments of  left ICA. Findings may represent occlusion or proximal high-grade stenosis. Age indeterminate. 3. Large anterior communicating artery providing collateral circulation to the left MCA and left PCA distributions. Decreased signal in left MCA and left PCA distribution from increased transit time. CTA of the head and neck can better assess vessel  stenosis and patency. If clinically indicated consider CT perfusion of the head to assess for brain oligemia. These results will be called to the ordering clinician or representative by the Radiologist Assistant, and communication documented in the PACS or zVision Dashboard. Electronically Signed   By: Kristine Garbe M.D.   On: 06/06/2018 18:14   Ct Angio Chest Aorta W And/or Wo Contrast  Result Date: 05/28/2018 CLINICAL DATA:  Centralized non radiating chest pain beginning last evening. Recent spinal surgery. Evaluate for pulmonary embolism. EXAM: CT ANGIOGRAPHY CHEST WITH CONTRAST TECHNIQUE: Multidetector CT imaging of the chest was performed using the standard protocol during bolus administration of intravenous contrast. Multiplanar CT image reconstructions and MIPs were obtained to evaluate the vascular anatomy. CONTRAST:  1110m ISOVUE-370 IOPAMIDOL (ISOVUE-370) INJECTION 76% COMPARISON:  Chest radiograph - 05/28/2018 FINDINGS: Vascular Findings: There is adequate opacification of the pulmonary arterial system with the main pulmonary artery measuring 1,251 Hounsfield units. There are no discrete filling defects within the pulmonary arterial tree to suggest pulmonary embolism. Enlarged caliber of the main pulmonary artery measuring 35 mm in diameter. Cardiomegaly.  No pericardial effusion. Minimal amount of atherosclerotic plaque within a normal caliber thoracic aorta. Conventional configuration of the aortic arch. The branch vessels of the aortic arch appear patent throughout their imaged course. Review of the MIP images confirms the above findings. ---------------------------------------------------------------------------------- Nonvascular Findings: Mediastinum/Lymph Nodes: Potential mild circumferential wall thickening of the esophagus (representative image 46, series 6), potentially accentuated due to underdistention though conceivably esophagitis could have a similar appearance. No bulky  mediastinal, hilar or axillary lymphadenopathy. Lungs/Pleura: Minimal dependent subpleural ground-glass atelectasis. No discrete focal airspace opacities. No pleural effusion or pneumothorax. The central pulmonary airways appear widely patent. No discrete pulmonary nodules. Upper abdomen: Limited early arterial phase evaluation of the upper abdomen demonstrates mild thickening of the left adrenal gland without discrete nodule. Musculoskeletal: Regional soft tissues appear normal. There is a punctate (approximately 0.6 cm) hypoattenuating nodule within the inferior medial aspect the left lobe of the thyroid (image 23, series 6), of doubtful clinical concern. IMPRESSION: 1. Suspected circumferential esophageal wall thickening, potentially accentuated due to underdistention though potentially and esophagitis could have a similar appearance. Clinical correlation is advised. 2. Otherwise, no explanation for patient's centralized non radiating chest pain. Specifically, no evidence of pulmonary embolism. 3. Cardiomegaly with enlargement of the caliber the main pulmonary artery, nonspecific though could be seen in the setting of pulmonary arterial hypertension. Further evaluation cardiac echo could be performed as indicated. 4. Aortic Atherosclerosis (ICD10-I70.0). Electronically Signed   By: JSandi MariscalM.D.   On: 05/28/2018 17:04   Ct Head Code Stroke Wo Contrast  Result Date: 06/06/2018 CLINICAL DATA:  Code stroke. Initial evaluation for acute altered mental status, right-sided weakness. EXAM: CT HEAD WITHOUT CONTRAST TECHNIQUE: Contiguous axial images were obtained from the base of the skull through the vertex without intravenous contrast. COMPARISON:  Prior CT from 07/16/2007. FINDINGS: Brain: Cerebral volume within normal limits for age. Patchy hypodensity within the periventricular and deep white matter both cerebral hemispheres, nonspecific, but most like related chronic small vessel ischemic disease. No acute  intracranial hemorrhage. There is subtle loss of gray-white matter differentiation involving the anterior/mid left  temporal lobe, suspicious for possible developing acute ischemic left MCA territory infarct (series 3, image 15 on axial view, series 6, image 52 on sagittal sequence. This corresponds with M2 region. Gray-white matter differentiation otherwise grossly maintained. No mass lesion, midline shift or mass effect. No hydrocephalus. No extra-axial fluid collection. Vascular: No hyperdense vessel. Scattered vascular calcifications noted within the carotid siphons. Skull: Scalp soft tissues and calvarium within normal limits. Postoperative changes from prior posterior decompression and laminectomy noted at the upper cervical spine with probable associated postoperative seroma. Sinuses/Orbits: Globes and orbital soft tissues within normal limits. Chronic mucosal thickening within the right maxillary sinus. Paranasal sinuses are otherwise largely clear. No mastoid effusion. Other: None. ASPECTS [facility] Stroke Program Early CT Score) - Ganglionic level infarction (caudate, lentiform nuclei, internal capsule, insula, M1-M3 cortex): 6 - Supraganglionic infarction (M4-M6 cortex): 3 Total score (0-10 with 10 being normal): 9 IMPRESSION: 1. Subtle hypodensity involving the anterior/mid left temporal lobe, suspicious for evolving acute ischemic left MCA territory infarct. No associated hemorrhage. 2. ASPECTS is 9. 3. Chronic microvascular ischemic disease. These results were communicated to Dr. Cheral Marker At 5:48 amon 9/6/2019by text page via the [facility] messaging system. Electronically Signed   By: Jeannine Boga M.D.   On: 06/06/2018 05:51      Phillips Climes M.D on 06/17/2018 at 2:49 PM  Between 7am to 7pm - Pager - [phone]  After 7pm go to [email or web address] - password [facility]  [facility] -  Office  [phone]

## 2018-06-17 NOTE — Care Management Note (Addendum)
Case Management Note  Patient Details  Name: Ian Ramos MRN: [id number] Date of Birth: [birth year]  Subjective/Objective: Pt presented as a transfer from [facility]. Pt initially presented for Chest Pain- hospital course complicated by stroke. PT/OT recommendations for CIR.                 Action/Plan: Admissions Coordinator submitted clinicals for insurance authorization. Awaiting to see if approved. CSW and CM will continue to monitor for additional needs.   Expected Discharge Date:                  Expected Discharge Plan:  IP Rehab Facility  In-House Referral:  Clinical Social Work  Discharge planning Services  CM Consult  Post Acute Care Choice:   N/A Choice offered to:    N/A  DME Arranged:   N/A DME Agency:   N/A  HH Arranged:   N/A HH Agency:   N/A  Status of Service:  Completed If discussed at Long Length of Stay Meetings, dates discussed:    Additional Comments: 1130 06-19-18 Tomi Bamberger, RN, BSN [phone] Pt approved for CIR via Insurance. No further needs from the CM at this time.  Gala Lewandowsky, RN 06/17/2018, 2:41 PM

## 2018-06-17 NOTE — Progress Notes (Signed)
Occupational Therapy Treatment Patient Details Name: Ian Ramos MRN: [id number] DOB: Nov 11, 1945 Today's Date: 06/17/2018    History of present illness Pt is a 72 y/o male admitted secondary to intractable chest and L shoulder pain. Neurosurgery feels this is radicular irritation of his left-sided C5 nerve root and started pt on steroids and neurontin. Pt with recent cervical fusion (05/23/18). Imaging negative for PE and showed probable esophagitis. Pt underwent cardiac cath on 9/5. In the AM of 9/6 pt with acute change of mental status and code stroke called. Pt found to have left temporal lobe infarct and right MCA/PCA watershed and right parietal punctate infarcts. Pt also find to have significant carotid stenosis.   Pt coded 2023/06/28 and was intubated.  Extubated 06/12/18.  PMH includes gout and recent Cervical fusion.    OT comments  Pt with downgraded goals at this time and requires total +2 Max (A) to transfer to chair. Pt with cognitive and balance deficits affecting all adls. Pt will require (A) to initiate task and unable to sustain attention to task. Pt with decr fine and gross motor for bil UE. OT to continue to follow acutely. Recommend RN staff utilize the stedy for all transfers.   Follow Up Recommendations  CIR;Supervision/Assistance - 24 hour    Equipment Recommendations  3 in 1 bedside commode    Recommendations for Other Services Rehab consult    Precautions / Restrictions Precautions Precautions: Fall;Cervical Required Braces or Orthoses: Cervical Brace Cervical Brace: Soft collar;At all times       Mobility Bed Mobility Overal bed mobility: Needs Assistance Bed Mobility: Rolling;Supine to Sit Rolling: Max assist   Supine to sit: Mod assist     General bed mobility comments: Pt rolling R and L x3 each for peri care . pt requires multimodal cues for task. pt with initiation on the third attempt. pt requires (A) to elevate trunk from surface. Pt does attempt to  shift weight to place R foot on floor with cue  Transfers Overall transfer level: Needs assistance   Transfers: Sit to/from Stand;Stand Pivot Transfers Sit to Stand: +2 physical assistance;Mod assist;From elevated surface Stand pivot transfers: Max assist;+2 physical assistance;+2 safety/equipment;From elevated surface       General transfer comment: Pt requires total +2 and pad at hips for extension and (A) to pivot. Pt does not initiate steps. pt requires physical weight shift and lifting of R LE to transfer to chair    Balance Overall balance assessment: Needs assistance Sitting-balance support: Bilateral upper extremity supported;Feet supported Sitting balance-Leahy Scale: Poor     Standing balance support: Bilateral upper extremity supported;During functional activity Standing balance-Leahy Scale: Poor                             ADL either performed or assessed with clinical judgement   ADL Overall ADL's : Needs assistance/impaired Eating/Feeding: Total assistance;Bed level Eating/Feeding Details (indicate cue type and reason): RN feeding patient on arriavl Grooming: Total assistance;Sitting Grooming Details (indicate cue type and reason): total (A) to comb hair  Upper Body Bathing: Maximal assistance   Lower Body Bathing: Total assistance   Upper Body Dressing : Maximal assistance   Lower Body Dressing: Total assistance         Toileting - Clothing Manipulation Details (indicate cue type and reason): incontinent of bowel on arrival and unaware. pt does not ask for (A)       General ADL Comments: Pt  requires (A) to elevate from surface and to pivot to chair. pt does not intiate.      Vision       Perception     Praxis      Cognition Arousal/Alertness: Awake/alert Behavior During Therapy: Flat affect Overall Cognitive Status: Impaired/Different from baseline Area of Impairment: Attention;Memory;Following  commands;Safety/judgement;Awareness                   Current Attention Level: Focused Memory: Decreased recall of precautions;Decreased short-term memory Following Commands: Follows one step commands inconsistently;Follows one step commands with increased time Safety/Judgement: Decreased awareness of safety;Decreased awareness of deficits Awareness: Intellectual Problem Solving: Slow processing;Decreased initiation;Difficulty sequencing;Requires verbal cues;Requires tactile cues General Comments: Pt requires multi modal cues to initiate all task. pt requires physical (A) for initiate of knee flexion . pt has a generalized response to all questions or statements. "not right now" is an example of one response he repeats.         Exercises     Shoulder Instructions       General Comments      Pertinent Vitals/ Pain       Pain Assessment: No/denies pain  Home Living                                          Prior Functioning/Environment              Frequency  Min 3X/week        Progress Toward Goals  OT Goals(current goals can now be found in the care plan section)  Progress towards OT goals: Progressing toward goals;Goals drowngraded-see care plan  Acute Rehab OT Goals Patient Stated Goal: none stated OT Goal Formulation: Patient unable to participate in goal setting Time For Goal Achievement: 07/01/18 Potential to Achieve Goals: Good ADL Goals Pt Will Perform Grooming: with min assist;sitting Pt Will Perform Upper Body Bathing: sitting;with mod assist Pt Will Perform Lower Body Bathing: sit to/from stand;with mod assist Pt Will Perform Upper Body Dressing: sitting;with min assist Pt Will Perform Lower Body Dressing: sit to/from stand;with adaptive equipment;with mod assist Pt Will Transfer to Toilet: with min assist;with +2 assist;stand pivot transfer;bedside commode Pt Will Perform Toileting - Clothing Manipulation and hygiene: sit  to/from stand;with mod assist Pt Will Perform Tub/Shower Transfer: (dc not appropraite) Pt/caregiver will Perform Home Exercise Program: Increased ROM;Increased strength;Both right and left upper extremity;With written HEP provided;With theraputty Additional ADL Goal #1: Pt will follow 2 step command   Plan Discharge plan remains appropriate    Co-evaluation                 AM-PAC PT "6 Clicks" Daily Activity     Outcome Measure   Help from another person eating meals?: A Lot Help from another person taking care of personal grooming?: A Lot Help from another person toileting, which includes using toliet, bedpan, or urinal?: Total Help from another person bathing (including washing, rinsing, drying)?: A Lot Help from another person to put on and taking off regular upper body clothing?: A Lot Help from another person to put on and taking off regular lower body clothing?: Total 6 Click Score: 10    End of Session Equipment Utilized During Treatment: Cervical collar  OT Visit Diagnosis: Unsteadiness on feet (R26.81);Other abnormalities of gait and mobility (R26.89);Pain;Muscle weakness (generalized) (M62.81)   Activity Tolerance Patient tolerated treatment  well   Patient Left in chair;with call bell/phone within reach;with chair alarm set;with nursing/sitter in room   Nurse Communication Mobility status;Precautions        Time: 9450-3888 OT Time Calculation (min): 23 min  Charges: OT General Charges $OT Visit: 1 Visit OT Treatments $Self Care/Home Management : 23-37 mins   Mateo Flow, OTR/L  Acute Rehabilitation Services Pager: [phone] Office: [phone] .    Boone Master B 06/17/2018, 2:19 PM

## 2018-06-17 NOTE — Progress Notes (Addendum)
Progress Note  Patient Name: Ian Ramos Date of Encounter: 06/17/2018  Primary Cardiologist: Dr. Fransico Him  Subjective   Pt feeling about the same as yesterday. Denies chest pain or SOB.   Inpatient Medications    Scheduled Meds: .  stroke: mapping our early stages of recovery book   Does not apply Once  . aspirin  162 mg Per Tube Daily  . atorvastatin  80 mg Per Tube q1800  . carvedilol  3.125 mg Oral BID WC  . chlorhexidine gluconate (MEDLINE KIT)  15 mL Mouth Rinse BID  . cloNIDine  0.1 mg Transdermal Weekly  . clopidogrel  75 mg Per Tube Daily  . enoxaparin (LOVENOX) injection  40 mg Subcutaneous Q24H  . feeding supplement (ENSURE ENLIVE)  237 mL Oral BID BM  . feeding supplement (PRO-STAT SUGAR FREE 64)  30 mL Oral BID  . insulin aspart  0-15 Units Subcutaneous Q4H  . iopamidol  100 mL Intravenous Once  . mouth rinse  15 mL Mouth Rinse BID  . sodium chloride flush  3 mL Intravenous Q12H   Continuous Infusions: . sodium chloride 10 mL/hr at 06/09/18 1610  . sodium chloride 75 mL/hr at 06/17/18 0719  . famotidine (PEPCID) IV Stopped (06/16/18 1849)  . feeding supplement (VITAL AF 1.2 CAL) Stopped (06/15/18 1051)   PRN Meds: sodium chloride, acetaminophen, acetaminophen, fentaNYL (SUBLIMAZE) injection, RESOURCE THICKENUP CLEAR, sodium chloride flush   Vital Signs    Vitals:   06/17/18 0200 06/17/18 0416 06/17/18 0600 06/17/18 0912  BP:  (!) 126/58  136/63  Pulse:  (!) 54    Resp:  16    Temp: 98.3 F (36.8 C) 98.7 F (37.1 C) 98.7 F (37.1 C)   TempSrc: Axillary Axillary Axillary   SpO2:  99% 99%   Weight:   78.4 kg   Height:        Intake/Output Summary (Last 24 hours) at 06/17/2018 0928 Last data filed at 06/17/2018 0900 Gross per 24 hour  Intake 1795.74 ml  Output 1100 ml  Net 695.74 ml   Filed Weights   06/15/18 0509 06/16/18 0412 06/17/18 0600  Weight: 76.4 kg 76.6 kg 78.4 kg    Physical Exam   General: Well developed, well  nourished, NAD Skin: Warm, dry, intact  Head: Normocephalic, atraumatic, clear, moist mucus membranes. Neck: Negative for carotid bruits. No JVD Lungs:Clear to ausculation bilaterally. No wheezes, rales, or rhonchi. Breathing is unlabored. Cardiovascular: RRR with S1 S2. No murmurs, rubs, gallops, or LV heave appreciated. Abdomen: Soft, non-tender, non-distended with normoactive bowel sounds. No obvious abdominal masses. MSK: Strength and tone appear normal for age. 5/5 in all extremities Extremities: No edema. No clubbing or cyanosis. DP/PT pulses 2+ bilaterally Neuro: Alert and oriented to self and place. No focal deficits. No facial asymmetry. MAE spontaneously. Psych: Responds to some questions appropriately with normal affect.    Labs    Chemistry Recent Labs  Lab 06/15/18 0719 06/16/18 0625 06/17/18 0441  NA 136 138 138  K 4.0 4.2 4.0  CL 108 107 107  CO2 21* 22 23  GLUCOSE 146* 98 107*  BUN 28* 24* 29*  CREATININE 1.22 1.39* 1.44*  CALCIUM 7.6* 8.1* 8.0*  GFRNONAA 57* 49* 47*  GFRAA >60 56* 54*  ANIONGAP _0 Hematology Recent Labs  Lab 06/14/18 0214 06/15/18 0719 06/17/18 0441  WBC 6.1 6.8 6.4  RBC 2.73* 3.01* 2.72*  HGB 8.1* 8.9* 7.8*  HCT 24.9* 27.3* 24.9*  MCV 91.2 90.7 91.5  MCH 29.7 29.6 28.7  MCHC 32.5 32.6 31.3  RDW 12.6 12.4 12.2  PLT 165 173 200    Cardiac EnzymesNo results for input(s): TROPONINI in the last 168 hours. No results for input(s): TROPIPOC in the last 168 hours.   BNPNo results for input(s): BNP, PROBNP in the last 168 hours.   DDimer No results for input(s): DDIMER in the last 168 hours.   Radiology    No results found.  Telemetry    06/17/18 SB - Personally Reviewed  ECG    No new tracing as of 06/17/18 - Personally Reviewed  Cardiac Studies   Cardiaccatheterization 2018/06/12:  Prox RCA to Mid RCA lesion is 30% stenosed.  Acute Mrg lesion is 100% stenosed.  Prox LAD lesion is 75% stenosed.  Ost 2nd Mrg  to 2nd Mrg lesion is 75% stenosed.  LV end diastolic pressure is normal.   1. Moderate obstructive CAD - Eccentric 75% proximal LAD - 75% OM2 - Occluded RV marginal branch with left to right collaterals. 2. Normal LVEDP  Plan: Patient does not have critical CAD. I would recommend medical management. If he has refractory angina despite optimal medical therapy would consider PCI of LAD. The OM lesion would be more difficult to treat since it involves bifurcation of a large OM1.  Echocardiogram 05/29/2018: Study Conclusions  - Left ventricle: The cavity size was normal. Systolic function was moderately reduced. The estimated ejection fraction was in the range of 35% to 40%. Diffuse hypokinesis. Doppler parameters are consistent with abnormal left ventricular relaxation (grade 1 diastolic dysfunction). Doppler parameters are consistent with elevated ventricular end-diastolic filling pressure. - Aortic valve: A bicuspid morphology cannot be excluded; severely thickened, severely calcified leaflets. There was trivial regurgitation. - Mitral valve: There was mild regurgitation. - Left atrium: The atrium was normal in size. - Right atrium: The atrium was normal in size. - Tricuspid valve: There was trivial regurgitation. - Pulmonary arteries: Systolic pressure was within the normal range. - Inferior vena cava: The vessel was dilated. The respirophasic diameter changes were blunted (<50%), consistent with elevated central venous pressure. - Pericardium, extracardiac: There was no pericardial effusion.  Patient Profile     72 y.o. male with a history of CKD stage III, cervical disc disease status post recent cervical laminectomy in late August. He presents with chest pain and early documented cardiomyopathy. Cardiac catheterization revealed moderate CAD as outlined with initial plan for medical therapy. Hospital course also complicated by stroke in  the setting of high-grade ICA stenoses, hypoxic respiratory failure requiring mechanical ventilation.  Assessment & Plan    1. Moderate CAD: -Per cardiac catheterization on 06/12/2018 with moderate obstructive CAD -Continue ASA 81 daily, Plavix, Lipitor, Coreg 3.125 mg twice daily -Denies chest pain today  2. Cardiomyopathy: -LVEF 35 to 40% with diffuse hypokinesis per echocardiogram 05/29/2018 -Continue Coreg 3.125 mg twice daily -No ARB secondary to acute renal insuffiencey -Creatinine, 1.44 today, up from 1.39 yesterday  3. Acute on chronic kidney disease: -Creatinine, 1.44 today, up from 1.39 yesterday -Baseline appears to be in the 1.5-1.6 range -Holding ARB for now>>> consider restarting once renal function is more stabilized -Avoid nephrotoxic medications  4. Severe ICA disease: -Evaluated by BP S for carotid stenting>> patient with a known left ICA occlusion right greater than 80% -VVS to follow patient next week during his recovery for progress  5. Possible aspiration PNA: -Per primary team -No fever, WBC within normal limits  6. Essential HTN: -Stable, 136/63, 126/58, 130/62 -Continue Coreg, clonidine  7. Hx of stroke: -Per MRI which revealed left M1/M2 and right MCA/PCA watershed and right parietal punctate infarcts>> consistent with hypoperfusion from right ICA high-grade stenosis of left ICA occlusion -Per neurology/VVS/primary team  Signed, Kathyrn Drown NP-C [facility] Pager: [phone] 06/17/2018, 9:28 AM     For questions or updates, please contact   Please consult [email or web address] for contact info under Cardiology/STEMI.  Patient examined chart reviewed Soft collar in place Not very lucid right bruit clear lungs no murmur Moderate CAD Rx medically euvolemic Ok to transfer to rehab. Had fever 101 this am now gone. Dr Oneida Alar has seen and plans carotid stenting in 1-2 weeks of RICA   .Will sign off  Jenkins Rouge

## 2018-06-17 NOTE — Progress Notes (Signed)
Pt's rectal temperature was 101.4 after giving 650 mg tylenol. MD was notified. Orders received. Will continue to monitor.

## 2018-06-18 LAB — GLUCOSE, CAPILLARY
GLUCOSE-CAPILLARY: 110 mg/dL — AB (ref 70–99)
GLUCOSE-CAPILLARY: 127 mg/dL — AB (ref 70–99)
GLUCOSE-CAPILLARY: 137 mg/dL — AB (ref 70–99)
Glucose-Capillary: 107 mg/dL — ABNORMAL HIGH (ref 70–99)
Glucose-Capillary: 135 mg/dL — ABNORMAL HIGH (ref 70–99)
Glucose-Capillary: 147 mg/dL — ABNORMAL HIGH (ref 70–99)

## 2018-06-18 LAB — BASIC METABOLIC PANEL
Anion gap: 9 (ref 5–15)
BUN: 26 mg/dL — ABNORMAL HIGH (ref 8–23)
CO2: 20 mmol/L — AB (ref 22–32)
Calcium: 7.6 mg/dL — ABNORMAL LOW (ref 8.9–10.3)
Chloride: 108 mmol/L (ref 98–111)
Creatinine, Ser: 1.35 mg/dL — ABNORMAL HIGH (ref 0.61–1.24)
GFR calc Af Amer: 59 mL/min — ABNORMAL LOW (ref >60)
GFR calc non Af Amer: 50 mL/min — ABNORMAL LOW (ref >60)
Glucose, Bld: 112 mg/dL — ABNORMAL HIGH (ref 70–99)
Potassium: 3.6 mmol/L (ref 3.5–5.1)
SODIUM: 137 mmol/L (ref 135–145)

## 2018-06-18 LAB — CBC
HCT: 24.1 % — ABNORMAL LOW (ref 39.0–52.0)
Hemoglobin: 7.8 g/dL — ABNORMAL LOW (ref 13.0–17.0)
MCH: 29 pg (ref 26.0–34.0)
MCHC: 32.4 g/dL (ref 30.0–36.0)
MCV: 89.6 fL (ref 78.0–100.0)
Platelets: 215 10*3/uL (ref 150–400)
RBC: 2.69 MIL/uL — ABNORMAL LOW (ref 4.22–5.81)
RDW: 11.9 % (ref 11.5–15.5)
WBC: 4.9 10*3/uL (ref 4.0–10.5)

## 2018-06-18 NOTE — Progress Notes (Signed)
Physical Therapy Treatment Patient Details Name: Ian Ramos MRN: [id number] DOB: 1946-09-10 Today's Date: 06/18/2018    History of Present Illness Pt is a 72 y/o male admitted secondary to intractable chest and L shoulder pain. Neurosurgery feels this is radicular irritation of his left-sided C5 nerve root and started pt on steroids and neurontin. Pt with recent cervical fusion (05/23/18). Imaging negative for PE and showed probable esophagitis. Pt underwent cardiac cath on 9/5. In the AM of 9/6 pt with acute change of mental status and code stroke called. Pt found to have left temporal lobe infarct and right MCA/PCA watershed and right parietal punctate infarcts. Pt also find to have significant carotid stenosis.   Pt coded 07/01/23 and was intubated.  Extubated 06/12/18.  PMH includes gout and recent Cervical fusion.     PT Comments    Pt making steady progress. Response time still delayed but quicker than last treatment session. Hopefully next session pt will be able to reach the hallway with ambulation..  Follow Up Recommendations  CIR;Supervision for mobility/OOB     Equipment Recommendations  Other (comment)(TBA at next venue)    Recommendations for Other Services       Precautions / Restrictions Precautions Precautions: Fall;Cervical Precaution Booklet Issued: No Precaution Comments: reviewed cervical precautions Required Braces or Orthoses: Cervical Brace Cervical Brace: Soft collar;At all times Restrictions Weight Bearing Restrictions: No    Mobility  Bed Mobility Overal bed mobility: Needs Assistance Bed Mobility: Rolling;Sidelying to Sit Rolling: Mod assist Sidelying to sit: Max assist;Mod assist       General bed mobility comments: Assist to bring shoulders over, elevate trunk into sitting and bring hips to EOB.  Transfers Overall transfer level: Needs assistance Equipment used: Rolling walker (2 wheeled) Transfers: Sit to/from Stand Sit to Stand: Mod  assist;+2 safety/equipment         General transfer comment: Assist to bring hips and trunk up. Pt needs extra time especially to fully extend trunk. Allowed pt to place hands on walker to encourage anterior weight shift.  Ambulation/Gait Ambulation/Gait assistance: Mod assist;+2 safety/equipment Gait Distance (Feet): 10 Feet Assistive device: Rolling walker (2 wheeled) Gait Pattern/deviations: Step-to pattern;Decreased step length - right;Decreased step length - left;Shuffle;Trunk flexed Gait velocity: Decreased Gait velocity interpretation: <1.8 ft/sec, indicate of risk for recurrent falls General Gait Details: Assist for support and balance. Verbal/tactile cues to stand more erect and to stay closer to the walker. Close follow with recliner behind patient.   Stairs             Wheelchair Mobility    Modified Rankin (Stroke Patients Only) Modified Rankin (Stroke Patients Only) Pre-Morbid Rankin Score: Moderately severe disability Modified Rankin: Moderately severe disability     Balance Overall balance assessment: Needs assistance Sitting-balance support: Feet supported;No upper extremity supported Sitting balance-Leahy Scale: Fair Sitting balance - Comments: Sat EOB with supervision x 7-8 minutes   Standing balance support: Bilateral upper extremity supported;During functional activity Standing balance-Leahy Scale: Poor Standing balance comment: walker and min assist for static standing                            Cognition Arousal/Alertness: Awake/alert Behavior During Therapy: Flat affect Overall Cognitive Status: Impaired/Different from baseline Area of Impairment: Problem solving;Awareness;Safety/judgement;Orientation;Attention;Memory;Following commands                 Orientation Level: Disoriented to;Time;Situation Current Attention Level: Sustained Memory: Decreased recall of precautions;Decreased short-term memory Following  Commands:  Follows one step commands with increased time;Follows one step commands consistently Safety/Judgement: Decreased awareness of safety;Decreased awareness of deficits Awareness: Intellectual Problem Solving: Difficulty sequencing;Requires verbal cues;Slow processing;Decreased initiation;Requires tactile cues        Exercises      General Comments General comments (skin integrity, edema, etc.): VSS      Pertinent Vitals/Pain Pain Assessment: No/denies pain    Home Living                      Prior Function            PT Goals (current goals can now be found in the care plan section) Progress towards PT goals: Progressing toward goals    Frequency    Min 3X/week      PT Plan Current plan remains appropriate    Co-evaluation              AM-PAC PT "6 Clicks" Daily Activity  Outcome Measure  Difficulty turning over in bed (including adjusting bedclothes, sheets and blankets)?: Unable Difficulty moving from lying on back to sitting on the side of the bed? : Unable Difficulty sitting down on and standing up from a chair with arms (e.g., wheelchair, bedside commode, etc,.)?: Unable Help needed moving to and from a bed to chair (including a wheelchair)?: A Lot Help needed walking in hospital room?: A Lot Help needed climbing 3-5 steps with a railing? : Total 6 Click Score: 8    End of Session Equipment Utilized During Treatment: Gait belt;Cervical collar Activity Tolerance: Patient tolerated treatment well Patient left: with call bell/phone within reach;in chair;with chair alarm set Nurse Communication: Mobility status PT Visit Diagnosis: Unsteadiness on feet (R26.81);Muscle weakness (generalized) (M62.81);Difficulty in walking, not elsewhere classified (R26.2);Other symptoms and signs involving the nervous system (R29.898)     Time: 7414-2395 PT Time Calculation (min) (ACUTE ONLY): 28 min  Charges:  $Therapeutic Activity: 23-37 mins                      [facility] PT Acute Rehabilitation Services Pager [phone] Office [phone]    Angelina Ok [facility] 06/18/2018, 12:30 PM

## 2018-06-18 NOTE — Progress Notes (Addendum)
Nutrition Follow-up  DOCUMENTATION CODES:   Not applicable  INTERVENTION:    Magic cup TID with meals, each supplement provides 290 kcal and 9 grams of protein  Snacks TID between meals  NUTRITION DIAGNOSIS:   Inadequate oral intake related to dysphagia as evidenced by meal completion < 50%.  Ongoing  GOAL:   Patient will meet greater than or equal to 90% of their needs  Unmet  MONITOR:   PO intake, Supplement acceptance   ASSESSMENT:   Pt with PMH of stage III CKD, s/p cervial laminectomy 8/19, esophageal dysmotility that resolved admitted with new cardiomyopathy with EF 35% s/p cath 9/5, 9/7 MRI shows R MCA/PCA watershed infarcts, 9/8 pt vomited twice while eating, 9/9 found unresponsive and pt tx to ICU and intubated.   Patient reports eating poorly on dysphagia 2 diet with honey thick liquids. He does not like the thickened beverages. He did not like the chicken salad at lunch today. He does like Magic cup. He has been consuming 25-50% of meals. Nurse tech reports patient fed himself breakfast today, but required assistance with lunch meal.  Cortrak tube was removed 9/16, TF off.   IVF (NS at 75 ml/h) continues for hydration as intake of honey thick liquids is poor.  Labs reviewed. CBG's: [id number] Medications reviewed and include Novolog.  Diet Order:   Diet Order            DIET DYS 2 Room service appropriate? Yes; Fluid consistency: Honey Thick  Diet effective now              EDUCATION NEEDS:   No education needs have been identified at this time  Skin:  Skin Assessment: Reviewed RN Assessment  Last BM:  9/16 (type 6)  Height:   Ht Readings from Last 1 Encounters:  05/28/18 5' 7.5" (1.715 m)    Weight:   Wt Readings from Last 1 Encounters:  06/18/18 78.9 kg    Ideal Body Weight:  68.6 kg  BMI:  Body mass index is 26.84 kg/m.  Estimated Nutritional Needs:   Kcal:  1750-2000  Protein:  90-110 grams  Fluid:  > 1.7  L/day    Joaquin Courts, RD, LDN, CNSC Pager [phone] After Hours Pager [phone]

## 2018-06-18 NOTE — Progress Notes (Addendum)
  Speech Language Pathology Treatment: Dysphagia  Patient Details Name: Ian Ramos MRN: [id number] DOB: 1946-07-13 Today's Date: 06/18/2018 Time: 1607-3710 SLP Time Calculation (min) (ACUTE ONLY): 18 min  Assessment / Plan / Recommendation Clinical Impression  Pt was alert and demonstrated full participation throughout dysphagia treatment session today. Given max verbal cues regarding orientation to place, he commented he was "in the doctor's office." When SLP expanded upon pt's response, he was able to identify "[facility]" as the hospital of his current location. Pt demonstrated impulsive behavior, evident in large bites and sips of PO's, although he was receptive to mod verbal cues from clinician. Nectar consistency trialed due to pt cough indicative of likely sensation during FEES without s/s aspiration noted  regular texture. While pt reported no difficulty with meals today, his cognitive impairments continue to present risk for aspiration and limit learning/use of compensatory strategies. Recommend continue dysphagia 2 (minced/ground) diet, upgrade to nectar thick liquids (straws allowed) with full supervision to facilitate use of safe swallow strategies. Pt believed to tolerate 3hrs therapy per day, and would be great candidate for comprehensive inpatient rehab, as he is improving daily. ST will continue to follow to provide treatment with diet safety and efficiency, including further opportunities for diet advancement.    HPI HPI: Ian Ramos is a 72 y.o. male with medical history significant for chronic kidney disease stage III, neuromuscular disorder, cervical stenosis with myelopathy status post cervical laminectomy on 05/23/2018, presented to the emergency department for evaluation of chest pain. Barium esophagram 05/30/18 moderate esophageal dysmotility. MRI is showing acute/early subacute infarcts:  2 in left superiour frontal lobe and 2 in the right posterolateral parietal lobe. CXR  No acute abnormality noted. SLE performed 9/8 with recommendation for continued ST. On 9/9 pt found to be unresponsive, not breathign and code blue called. Pt intubated 9/9-9/12      SLP Plan  Continue with current plan of care       Recommendations  Diet recommendations: Dysphagia 2 (fine chop);Nectar-thick liquid Liquids provided via: Cup;Straw Medication Administration: Whole meds with puree Supervision: Staff to assist with self feeding;Full supervision/cueing for compensatory strategies Compensations: Minimize environmental distractions;Slow rate;Small sips/bites;Lingual sweep for clearance of pocketing Postural Changes and/or Swallow Maneuvers: Seated upright 90 degrees                Oral Care Recommendations: Oral care BID Follow up Recommendations: Skilled Nursing facility SLP Visit Diagnosis: Dysphagia, unspecified (R13.10) Attention and concentration deficit following: Cerebral infarction Plan: Continue with current plan of care       Suzzette Righter, Student SLP                 Suzzette Righter 06/18/2018, 3:25 PM

## 2018-06-18 NOTE — Progress Notes (Signed)
PROGRESS NOTE                                                                                                                                                                                                             Patient Demographics:    Ian Ramos, is a 72 y.o. male, DOB - [birth year], AJO:[id number]  Admit date - 05/28/2018   Admitting Physician Vianne Bulls, MD  Outpatient Primary MD for the patient is Libby Maw, MD  LOS - 14   Chief Complaint  Patient presents with  . Chest Pain       Brief Narrative    72 year old male patient history of stage III chronic kidney disease, cervical spine stenosis status post recent cervical laminectomy August 2019.  Presented to the emergency room on 8/28 with chief complaint of chest pain, shoulder pain and substernal discomfort..  Was found to have some esophageal thickening, possibly some esophageal dysmotility, etiology was never clearly identified but resolved.  He was found to have new dilated cardiomyopathy with EF 35% and diffuse hypokinesis, and bundle branch block, but it was unclear as to whether or not if this was the primary cause of his discomfort.  The plan was to proceed with iliac ischemia evaluation.  He underwent cardiac catheterization on 9/5: Showed moderate obstructive coronary artery disease with 75% occluded proximal LAD 75% OM 2 occluded RV marginal branch with left to right collaterals.  This was not considered critical CAD and medical management was recommended. On 9/6 the patient developed acute mental status change.  On initial eval he was confused, preservation, and had right upper extremity drift and right visual field cut.  CT brain history left temporal lobe infarct felt possibly embolic in etiology.  Carotid ultrasound showed likely left carotid occlusion with high-grade right carotid stenosis.  Vascular surgery was consulted.  Plavix was recommended and delaying  carotid endarterectomy for further recovery was felt best course of action. 9/7: Continues to demonstrate aphasia.  Also with ongoing left upper extremity weakness.  MRI showed right MCA/PCA watershed and right parietal punctate infarcts felt more consistent with high-grade ICA stenosis on the left. 9/8: Patient vomited twice during the day while family was assisting with feeding 9/9: Found unresponsive in chair.  Blood pressure 48/32 heart rate 58 area not clear that he lost pulses.  Pulse  oximetry not recorded.  He was apneic, Code blue was called patient was intubated and transferred to the intensive care.  Consults: GI 8/29: Found asymptomatic esophagitis.  Also evidence of esophageal dysmotility on esophagram.  GI signed off on 8/30 with final recommendations for daily PPI x4 weeks and routine cancer screening Cardiology consulted 9/3 for chest pain.  Underwent left heart cath which showed diffuse coronary artery disease but noncritical recommended medical therapy, because he had bradycardia and CKD ARB was held he was recommended to continue hydralazine, nitrates and diuretics 9/7: Neurology consulted for stroke Vascular surgery consulted 9/6: For bilateral carotid artery disease: Started on Plavix.  Recommending gentle hydration.  Initial recommendation for stenting within 2 weeks, but given particular intubation, current recommendation is for carotid stenting within 4 to 6 weeks(note was on 06/10/2018)    Subjective:   Patient not very communicative.  Denies any complaints.  Specifically no chest pain or shortness of breath.   Assessment  & Plan :    Principal Problem:   Intractable pain Active Problems:   Cervical myelopathy (HCC)   Chest pain   CKD (chronic kidney disease), stage III (HCC)   Esophageal thickening   LBBB (left bundle branch block)   Postoperative anemia   DCM (dilated cardiomyopathy) (HCC)   Acute systolic CHF (congestive heart failure) (HCC)   Coronary artery  disease due to lipid rich plaque   Cerebrovascular accident (CVA) due to bilateral stenosis of carotid arteries ([place])   On mechanically assisted ventilation ([place])   Acute respiratory failure ([place])   Essential hypertension   Acute encephalopathy   Acute hypoxemic respiratory failure -Patient required intubation and mechanical ventilation. Unclear at this time what the etiology of his respiratory arrest was. - extubated, significantly improving, encouraged to use incentive spirometry and flutter valve, currently on room air.  No further episodes of fever.  Last fever was at midnight on 9/17 at 101.4 F  Acute bilateral CVA -  Initially felt to be embolic, though with critical bilateral carotid stenosis and hypotension as well as watershed distribution of most infarcts, ICA stenosis is felt to be etiology. Locations include periventricular left M1/M2 distribution, right MCA/PCA distribution, and right temporoparietal cortex with resultant confusion and aphasia.   - On antiplatelet per neurology, statin - HbA1c 6.1%,  Bilateral carotid artery stenosis Right ICA severely occluded (80-99%) and left ICA said to be completely occluded.  - Ideally 130-129mHg SBP  - ASA, plavix, statin  -Continue with PT/OT, inpatient rehabilitation consulted -He is followed by vascular surgery, plan for repeat stenting when he improved, very likely after rehab stay  Cardiomyopathy with LVEF 35 to 40% range and diffuse hypokinesis by echocardiogram. -Management per cardiology, with known history of CAD, left cardiac cath 06/05/2018 significant for eccentric 75% proximal LAD stenosis, 75% OM 2 stenosis, and occluded RV marginal branch with left-to-right collaterals.   Plan is medical therapy at per cardiology, noted on beta-blockers, already on clonidine patch, on aspirin, Plavix and statin, currently not on ACE or arms given his renal failure.   Cervical myelopathy status post laminectomy -Recommendation for  CIR  Anemia of chronic disease -Hemoglobin is low but stable.  No evidence for overt bleeding.    Acute on chronic renal insufficiency -Renal function is at baseline.  Acute encephalopathy, acute delirium -Multifactorial, in the setting of CVA, recent surgery, ICU delirium and stay, prolonged hospitalization. -Seems to be close to his baseline now. -Diet has been advanced by SLP.  He was on tube  feedings which have been discontinued.  Urinary retention - Foley catheter discontinued, no recurrence of retention  Essential hypertension -Continue with clonidine patch, he was started on low-dose Coreg by cardiology as well  Code Status : Full  Family Communication  : none at bedside  Disposition Plan  : CIR   Consults  : Cardiology, neurology, PCCM vascular surgery  Procedures  :  Left heart cath 9/5 showing moderate noncritical coronary artery disease.  75% occluded proximal LAD 75% occluded OM.  Medical management was recommended  Significant Diagnostic Tests: Echocardiogram 8/29:EF 35 to 40%.  Diffuse hypokinesis.  Doppler parameters consistent with grade 1 diastolic dysfunction. 9/6: MRI brainExtensive motion artifact.  Minimal flow related signal in the left cervical ICA and absent flow of the left ICA representing occlusion or proximal high-grade stenosis.  There is a large anterior communicating artery providing collateral circulation to the left MCA and left PCA. 9/6: CT brain: Subtle hypodensity in the anterior mid left temporal lobe 9/4: Nuclear stress test EF 39% findings were consistent with ischemia felt to be intermittent risk 8/30: Esophagram showing moderate esophageal dysmotility 9/12 extubated, confused  DVT Prophylaxis  :  lovenox  Lab Results  Component Value Date   PLT 215 06/18/2018    Antibiotics  :    Anti-infectives (From admission, onward)   None        Objective:   Vitals:   06/17/18 2001 06/18/18 0000 06/18/18 0352 06/18/18 0824  BP:  112/72 (!) 101/51 114/70 (!) 136/56  Pulse: 69 66 70 70  Resp: 20 (!) 21 16 16   Temp: 99.3 F (37.4 C) 99.4 F (37.4 C) 98.1 F (36.7 C) (!) 97.5 F (36.4 C)  TempSrc: Oral Axillary Axillary Axillary  SpO2: 97% 99% 97% 98%  Weight:   78.9 kg   Height:        Wt Readings from Last 3 Encounters:  06/18/18 78.9 kg  05/23/18 79.4 kg  05/16/18 79.4 kg     Intake/Output Summary (Last 24 hours) at 06/18/2018 1320 Last data filed at 06/18/2018 1231 Gross per 24 hour  Intake 3247.12 ml  Output 726 ml  Net 2521.12 ml     Physical Exam  Awake alert.  In no distress Lungs are clear to auscultation bilaterally.  No wheezing rales or rhonchi.  Normal effort S1-S2 is normal regular.  No S3-S4. Abdomen is soft.  Nontender nondistended.  No obvious masses or organomegaly He is awake alert.  Somewhat distracted.  Left upper extremity weakness    Data Review:    CBC Recent Labs  Lab 06/13/18 0435 06/14/18 0214 06/15/18 0719 06/17/18 0441 06/18/18 0539  WBC 8.0 6.1 6.8 6.4 4.9  HGB 8.6* 8.1* 8.9* 7.8* 7.8*  HCT 26.2* 24.9* 27.3* 24.9* 24.1*  PLT 160 165 173 200 215  MCV 89.4 91.2 90.7 91.5 89.6  MCH 29.4 29.7 29.6 28.7 29.0  MCHC 32.8 32.5 32.6 31.3 32.4  RDW 12.1 12.6 12.4 12.2 11.9    Chemistries  Recent Labs  Lab 06/12/18 0431 06/12/18 1648 06/13/18 0435 06/14/18 0214 06/15/18 0719 06/16/18 0625 06/17/18 0441 06/18/18 0539  NA 136  --  139 137 136 138 138 137  K 4.1  --  3.7 3.8 4.0 4.2 4.0 3.6  CL 109  --  112* 108 108 107 107 108  CO2 20*  --  21* 22 21* 22 23 20*  GLUCOSE 257*  --  153* 133* 146* 98 107* 112*  BUN 28*  --  34* 33* 28* 24* 29* 26*  CREATININE 1.38*  --  1.25* 1.18 1.22 1.39* 1.44* 1.35*  CALCIUM 8.0*  --  7.6* 7.7* 7.6* 8.1* 8.0* 7.6*  MG 1.7 2.0 2.1 1.6*  --  1.7  --   --      Lab Results  Component Value Date   HGBA1C 6.1 (H) 06/06/2018    Inpatient Medications  Scheduled Meds: .  stroke: mapping our early stages of recovery  book   Does not apply Once  . aspirin  162 mg Per Tube Daily  . atorvastatin  80 mg Per Tube q1800  . carvedilol  3.125 mg Oral BID WC  . chlorhexidine gluconate (MEDLINE KIT)  15 mL Mouth Rinse BID  . cloNIDine  0.1 mg Transdermal Weekly  . clopidogrel  75 mg Per Tube Daily  . enoxaparin (LOVENOX) injection  40 mg Subcutaneous Q24H  . feeding supplement (ENSURE ENLIVE)  237 mL Oral BID BM  . feeding supplement (PRO-STAT SUGAR FREE 64)  30 mL Oral BID  . insulin aspart  0-15 Units Subcutaneous Q4H  . iopamidol  100 mL Intravenous Once  . mouth rinse  15 mL Mouth Rinse BID  . sodium chloride flush  3 mL Intravenous Q12H   Continuous Infusions: . sodium chloride 10 mL/hr at 06/09/18 1610  . sodium chloride 75 mL/hr at 06/18/18 1000  . famotidine (PEPCID) IV Stopped (06/17/18 1700)  . feeding supplement (VITAL AF 1.2 CAL) Stopped (06/15/18 1051)   PRN Meds:.sodium chloride, acetaminophen, acetaminophen, fentaNYL (SUBLIMAZE) injection, RESOURCE THICKENUP CLEAR, sodium chloride flush  Micro Results Recent Results (from the past 240 hour(s))  Culture, respiratory (non-expectorated)     Status: None   Collection Time: 06/09/18  4:48 PM  Result Value Ref Range Status   Specimen Description TRACHEAL ASPIRATE  Final   Special Requests NONE  Final   Gram Stain   Final    RARE WBC PRESENT, PREDOMINANTLY PMN FEW GRAM POSITIVE COCCI FEW GRAM POSITIVE RODS    Culture   Final    Consistent with normal respiratory flora. Performed at [facility], [place] [address], [place], [place] [address]    Report Status 06/11/2018 FINAL  Final    Radiology Reports Dg Chest 2 View  Result Date: 05/28/2018 CLINICAL DATA:  Chest pain and weakness for a few days. The patient underwent cervical fusion 05/23/2018. EXAM: CHEST - 2 VIEW COMPARISON:  PA and lateral chest 05/26/2018. FINDINGS: Lung volumes are low with minimal atelectasis in the left base. No pneumothorax or pleural effusion. Heart size is  normal. No acute or focal bony abnormality. Degenerative disease about the shoulders noted. IMPRESSION: No acute disease. Electronically Signed   By: Inge Rise M.D.   On: 05/28/2018 11:57   Dg Chest 2 View  Result Date: 05/26/2018 CLINICAL DATA:  72 year old male with weakness and cough. Status post recent cervical spine surgery. EXAM: CHEST - 2 VIEW COMPARISON:  None. FINDINGS: Cardiac and mediastinal contours are within normal limits. Atherosclerotic calcifications are present within the transverse aorta. Mild linear opacity in the lingula most consistent with atelectasis. No evidence of pulmonary edema, pleural effusion or pneumothorax. No suspicious pulmonary nodule or mass. Degenerative changes are present in both shoulders including the acromioclavicular joints and glenohumeral joints. High-riding humeral heads bilaterally with decreased subacromial space consistent with chronic rotator cuff injuries. No acute osseous abnormality. IMPRESSION: 1. Linear opacity in the lingula most consistent with atelectasis. 2.  Aortic Atherosclerosis (ICD10-170.0) 3. Bilateral degenerative  changes in the glenohumeral and acromioclavicular joints. Electronically Signed   By: Jacqulynn Cadet M.D.   On: 05/26/2018 08:01   Dg Cervical Spine 2-3 Views  Result Date: 05/23/2018 CLINICAL DATA:  C1-C6 decompression and fusion. EXAM: CERVICAL SPINE - 2-3 VIEW; DG C-ARM 61-120 MIN COMPARISON:  MRI 01/01/2018 FINDINGS: C-arm images show posterior fusion with lateral mass screws and posterior rods from C1 through C6. No radiographically detectable complication on this limited imaging. IMPRESSION: Posterior fusion from C1 through C6. Electronically Signed   By: Nelson Chimes M.D.   On: 05/23/2018 16:26   Ct Cervical Spine Wo Contrast  Result Date: 05/28/2018 CLINICAL DATA:  Chest pain.  Recent cervical spine surgery. EXAM: CT CERVICAL SPINE WITHOUT CONTRAST TECHNIQUE: Multidetector CT imaging of the cervical spine was  performed without intravenous contrast. Multiplanar CT image reconstructions were also generated. COMPARISON:  MRI cervical spine January 01, 2018 FINDINGS: ALIGNMENT: Straightened cervical lordosis.  No malalignment. SKULL BASE AND VERTEBRAE: Vertebral bodies intact. Interval C1 through C6 laminectomies. Intact well-seated lateral mass screws. The RIGHT C3, C4, C5 and C6 screws are intra-articular. The LEFT C3 through C6 facet screws are intra-articular, the LEFT facet screw extends into the foramen transversarium. The LEFT C6 screw extends into the cephalad aspect of the foramen transversarium. Intact hardware. Non incorporated posterior bone graft material. Stable moderate to severe disc height loss C3-4 through T1-2 with endplate sclerosis and marginal spurring compatible with degenerative discs. Stable geographic subcentimeter cyst and posterior odontoid process without cortical disruption. 1 cm calcified pannus posterior to the odontoid process seen with CPPD without destructive bony changes. SOFT TISSUES AND SPINAL CANAL: And gas within the surgical bed and ventral epidural space at C1-2 consistent with recent surgery. Small prevertebral effusion is likely postoperative. Mild calcific atherosclerosis aortic arch. DISC LEVELS: Canal obscured by hardware streak artifact. Neural foraminal narrowing all cervical levels varying from moderate to severe. UPPER CHEST: Lung apices are clear. OTHER: None. IMPRESSION: 1. Status post recent C1 through C6 laminectomies and posterior instrumentation. Multilevel intra-articular facet screws, LEFT C2 screw in foramen transversarium. 2. Postoperative fluid collection and gas consistent with recent surgery/seroma, pseudomeningocele not excluded. If there is clinical concern for infection, consider sampling. 3. No fracture deformity or malalignment. Aortic Atherosclerosis (ICD10-I70.0). Electronically Signed   By: Elon Alas M.D.   On: 05/28/2018 16:32   Mr Brain Wo  Contrast  Result Date: 06/06/2018 CLINICAL DATA:  72 y/o M; acute altered mental status and right-sided weakness. Stroke evaluation. EXAM: MRI HEAD WITHOUT CONTRAST MRA HEAD WITHOUT CONTRAST TECHNIQUE: Axial DWI, coronal DWI, axial T2 FLAIR sequences of the head were acquired. Angiographic images of the head were obtained using MRA technique without contrast. The patient was unable to continue and additional sequences were not acquired. COMPARISON:  06/06/2018 CT head.  05/28/2018 CT cervical spine. FINDINGS: MRI HEAD FINDINGS Brain: Subcentimeter foci of reduced diffusion compatible with acute/early subacute infarction are present, 2 in the left superior frontal lobe, and 2 in the right posterior lateral parietal lobe. No signal abnormality in the left anterior temporal lobe, findings on CT likely related to artifact. Several nonspecific T2 FLAIR hyperintensities in subcortical and periventricular white matter are compatible with mild chronic microvascular ischemic changes for age. Mild volume loss of the brain. No hydrocephalus, extra-axial collection, or herniation. Vascular: As below. Skull and upper cervical spine: Partially visualized cervical spinal fusion and laminectomy. Large fluid collection within the postoperative bed as seen on prior CT of the cervical spine. No reduced  diffusion of the visible portions of the fluid collection to indicate infection. Sinuses/Orbits: Negative. Other: None. MRA HEAD FINDINGS Motion degraded study. Anterior circulation: Minimal flow related signal within the left cervical internal carotid artery and no appreciable flow related signal of the left petrous and cavernous segments of the left internal carotid artery. Faint flow related signal within the terminal left ICA, probably retrograde. Right internal carotid artery is patent, moderate to severe stenosis of the right cavernous segment. Normal flow related signal within the right MCA and bilateral ACA distributions. Large  anterior communicating artery largely perfusing the left MCA distribution which is patent. Asymmetry in the MCA distribution signal, left less than right, is likely due to signal loss from delayed transit via A-comm collateralization. Posterior circulation: Faint flow related signal in the left posterior cerebral artery which appears to be a fetal PCA is likely due to signal loss from delayed transit due to A-comm collateralization from the right ICA. Normal flow related signal within the vertebral arteries, basilar artery, and right PCA. IMPRESSION: MRI head: 1. Diffusion and T2 FLAIR weighted sequences were acquired. The patient was unable to continue and additional sequences were not acquired. 2. Subcentimeter acute/early subacute infarctions, 2 in the left superior frontal lobe, and 2 in the right posterolateral parietal lobe. No associated mass effect. 3. Mild chronic microvascular ischemic changes and volume loss of the brain. MRA head: 1. Extensive motion artifact. 2. Minimal flow related signal in the left cervical ICA and absent flow related signal within the petrous and cavernous segments of left ICA. Findings may represent occlusion or proximal high-grade stenosis. Age indeterminate. 3. Large anterior communicating artery providing collateral circulation to the left MCA and left PCA distributions. Decreased signal in left MCA and left PCA distribution from increased transit time. CTA of the head and neck can better assess vessel stenosis and patency. If clinically indicated consider CT perfusion of the head to assess for brain oligemia. These results will be called to the ordering clinician or representative by the Radiologist Assistant, and communication documented in the PACS or zVision Dashboard. Electronically Signed   By: Kristine Garbe M.D.   On: 06/06/2018 18:14   Dg Esophagus  Result Date: 05/30/2018 CLINICAL DATA:  Esophageal wall thickening on CT EXAM: ESOPHOGRAM/BARIUM SWALLOW  TECHNIQUE: Single contrast examination was performed using  thin barium. FLUOROSCOPY TIME:  Fluoroscopy Time:  42 seconds Radiation Exposure Index (if provided by the fluoroscopic device): 12.5 mGy Number of Acquired Spot Images: 8 COMPARISON:  CTA chest dated 05/28/2018 FINDINGS: Limited evaluation due to difficulty with patient positioning and inability to perform double-contrast evaluation. Single contrast evaluation in the recumbent supine position was performed. No mucosal irregularity to suggest esophageal mass. No fixed esophageal narrowing or stricture. Moderate esophageal dysmotility. No evidence of hiatal hernia. Visualized stomach is grossly unremarkable. IMPRESSION: Moderate esophageal dysmotility. Otherwise negative single-contrast esophagram. Electronically Signed   By: Julian Hy M.D.   On: 05/30/2018 09:30   Nm Myocar Multi W/spect W/wall Motion / Ef  Result Date: 06/04/2018  There was no ST segment deviation noted during stress.  Defect 1: There is a medium defect of moderate severity present in the basal anteroseptal and mid anteroseptal location.  Defect 2: There is a medium defect of severe severity present in the basal inferior and mid inferior location.  This is an intermediate risk study.  Findings consistent with ischemia.  Nuclear stress EF: 39%.  The left ventricular ejection fraction is moderately decreased (30-44%).  Abnormal, intermediate risk  stress nuclear study with predominantly fixed anteroseptal defect possibly secondary to left bundle branch block; mild to moderate inferior ischemia; gated ejection fraction 39% with hypokinesis of the septum, mild left ventricular enlargement.   Dg Chest Port 1 View  Result Date: 06/12/2018 CLINICAL DATA:  Respiratory failure EXAM: PORTABLE CHEST 1 VIEW COMPARISON:  06/09/2018 FINDINGS: Endotracheal tube and left subclavian central line are again noted and stable. Feeding catheter has been placed and courses towards the  stomach. Previously seen nasogastric catheter has been removed. Postsurgical changes in the cervical spine are noted. Cardiac shadow is stable. Lungs are well aerated bilaterally without focal infiltrate or sizable effusion. IMPRESSION: Tubes and lines as described above. No acute abnormality noted. Electronically Signed   By: Inez Catalina M.D.   On: 06/12/2018 09:59   Dg Chest Port 1 View  Result Date: 06/09/2018 CLINICAL DATA:  Central line placement confirmation EXAM: PORTABLE CHEST 1 VIEW COMPARISON:  06/09/2018 1645 hours FINDINGS: Endotracheal and NG tubes stable. Left subclavian central venous catheter placed. Tip is in the mid SVC. There is no ensuing left pneumothorax. Normal heart size. Clear and under aerated lungs. IMPRESSION: Left subclavian central venous catheter placement with its tip in the mid SVC and no pneumothorax per Clear lungs. Stable endotracheal and NG tubes. Electronically Signed   By: Marybelle Killings M.D.   On: 06/09/2018 18:09   Dg Chest Port 1 View  Result Date: 06/09/2018 CLINICAL DATA:  72 year old male with acute respiratory failure. Subsequent encounter. EXAM: PORTABLE CHEST 1 VIEW COMPARISON:  05/28/2018 CT and chest x-ray. FINDINGS: Endotracheal tube tip centrally located 4.9 cm above the carina. Nasogastric tube tip gastric fundus level. No infiltrate or congestive heart failure. No obvious pneumothorax. Heart size within normal limits. Calcified mildly tortuous aorta. IMPRESSION: 1. No infiltrate or congestive heart failure. 2.  Aortic Atherosclerosis (ICD10-I70.0). Electronically Signed   By: Genia Del M.D.   On: 06/09/2018 16:57   Dg Abd Portable 1v  Result Date: 06/11/2018 CLINICAL DATA:  Feeding tube placement EXAM: PORTABLE ABDOMEN - 1 VIEW COMPARISON:  06/10/2018 FINDINGS: Feeding tube has been placed with the tip in the distal descending duodenum. Nonobstructive bowel gas pattern. IMPRESSION: Feeding tube in the distal descending duodenum. Electronically Signed    By: Rolm Baptise M.D.   On: 06/11/2018 11:12   Dg Abd Portable 1v  Result Date: 06/10/2018 CLINICAL DATA:  Status post OG tube placement. EXAM: PORTABLE ABDOMEN - 1 VIEW COMPARISON:  None. FINDINGS: OG tube is in place with the tip just within the stomach. Side port is in the distal esophagus. Recommend advancement of 5-6 cm. IMPRESSION: As above. Electronically Signed   By: Inge Rise M.D.   On: 06/10/2018 10:27   Dg C-arm 1-60 Min  Result Date: 05/23/2018 CLINICAL DATA:  C1-C6 decompression and fusion. EXAM: CERVICAL SPINE - 2-3 VIEW; DG C-ARM 61-120 MIN COMPARISON:  MRI 01/01/2018 FINDINGS: C-arm images show posterior fusion with lateral mass screws and posterior rods from C1 through C6. No radiographically detectable complication on this limited imaging. IMPRESSION: Posterior fusion from C1 through C6. Electronically Signed   By: Nelson Chimes M.D.   On: 05/23/2018 16:26   Mr Jodene Nam Head Wo Contrast  Result Date: 06/06/2018 CLINICAL DATA:  72 y/o M; acute altered mental status and right-sided weakness. Stroke evaluation. EXAM: MRI HEAD WITHOUT CONTRAST MRA HEAD WITHOUT CONTRAST TECHNIQUE: Axial DWI, coronal DWI, axial T2 FLAIR sequences of the head were acquired. Angiographic images of the head were obtained using  MRA technique without contrast. The patient was unable to continue and additional sequences were not acquired. COMPARISON:  06/06/2018 CT head.  05/28/2018 CT cervical spine. FINDINGS: MRI HEAD FINDINGS Brain: Subcentimeter foci of reduced diffusion compatible with acute/early subacute infarction are present, 2 in the left superior frontal lobe, and 2 in the right posterior lateral parietal lobe. No signal abnormality in the left anterior temporal lobe, findings on CT likely related to artifact. Several nonspecific T2 FLAIR hyperintensities in subcortical and periventricular white matter are compatible with mild chronic microvascular ischemic changes for age. Mild volume loss of the  brain. No hydrocephalus, extra-axial collection, or herniation. Vascular: As below. Skull and upper cervical spine: Partially visualized cervical spinal fusion and laminectomy. Large fluid collection within the postoperative bed as seen on prior CT of the cervical spine. No reduced diffusion of the visible portions of the fluid collection to indicate infection. Sinuses/Orbits: Negative. Other: None. MRA HEAD FINDINGS Motion degraded study. Anterior circulation: Minimal flow related signal within the left cervical internal carotid artery and no appreciable flow related signal of the left petrous and cavernous segments of the left internal carotid artery. Faint flow related signal within the terminal left ICA, probably retrograde. Right internal carotid artery is patent, moderate to severe stenosis of the right cavernous segment. Normal flow related signal within the right MCA and bilateral ACA distributions. Large anterior communicating artery largely perfusing the left MCA distribution which is patent. Asymmetry in the MCA distribution signal, left less than right, is likely due to signal loss from delayed transit via A-comm collateralization. Posterior circulation: Faint flow related signal in the left posterior cerebral artery which appears to be a fetal PCA is likely due to signal loss from delayed transit due to A-comm collateralization from the right ICA. Normal flow related signal within the vertebral arteries, basilar artery, and right PCA. IMPRESSION: MRI head: 1. Diffusion and T2 FLAIR weighted sequences were acquired. The patient was unable to continue and additional sequences were not acquired. 2. Subcentimeter acute/early subacute infarctions, 2 in the left superior frontal lobe, and 2 in the right posterolateral parietal lobe. No associated mass effect. 3. Mild chronic microvascular ischemic changes and volume loss of the brain. MRA head: 1. Extensive motion artifact. 2. Minimal flow related signal in  the left cervical ICA and absent flow related signal within the petrous and cavernous segments of left ICA. Findings may represent occlusion or proximal high-grade stenosis. Age indeterminate. 3. Large anterior communicating artery providing collateral circulation to the left MCA and left PCA distributions. Decreased signal in left MCA and left PCA distribution from increased transit time. CTA of the head and neck can better assess vessel stenosis and patency. If clinically indicated consider CT perfusion of the head to assess for brain oligemia. These results will be called to the ordering clinician or representative by the Radiologist Assistant, and communication documented in the PACS or zVision Dashboard. Electronically Signed   By: Kristine Garbe M.D.   On: 06/06/2018 18:14   Ct Angio Chest Aorta W And/or Wo Contrast  Result Date: 05/28/2018 CLINICAL DATA:  Centralized non radiating chest pain beginning last evening. Recent spinal surgery. Evaluate for pulmonary embolism. EXAM: CT ANGIOGRAPHY CHEST WITH CONTRAST TECHNIQUE: Multidetector CT imaging of the chest was performed using the standard protocol during bolus administration of intravenous contrast. Multiplanar CT image reconstructions and MIPs were obtained to evaluate the vascular anatomy. CONTRAST:  174m ISOVUE-370 IOPAMIDOL (ISOVUE-370) INJECTION 76% COMPARISON:  Chest radiograph - 05/28/2018 FINDINGS: Vascular Findings:  There is adequate opacification of the pulmonary arterial system with the main pulmonary artery measuring 1,251 Hounsfield units. There are no discrete filling defects within the pulmonary arterial tree to suggest pulmonary embolism. Enlarged caliber of the main pulmonary artery measuring 35 mm in diameter. Cardiomegaly.  No pericardial effusion. Minimal amount of atherosclerotic plaque within a normal caliber thoracic aorta. Conventional configuration of the aortic arch. The branch vessels of the aortic arch appear  patent throughout their imaged course. Review of the MIP images confirms the above findings. ---------------------------------------------------------------------------------- Nonvascular Findings: Mediastinum/Lymph Nodes: Potential mild circumferential wall thickening of the esophagus (representative image 46, series 6), potentially accentuated due to underdistention though conceivably esophagitis could have a similar appearance. No bulky mediastinal, hilar or axillary lymphadenopathy. Lungs/Pleura: Minimal dependent subpleural ground-glass atelectasis. No discrete focal airspace opacities. No pleural effusion or pneumothorax. The central pulmonary airways appear widely patent. No discrete pulmonary nodules. Upper abdomen: Limited early arterial phase evaluation of the upper abdomen demonstrates mild thickening of the left adrenal gland without discrete nodule. Musculoskeletal: Regional soft tissues appear normal. There is a punctate (approximately 0.6 cm) hypoattenuating nodule within the inferior medial aspect the left lobe of the thyroid (image 23, series 6), of doubtful clinical concern. IMPRESSION: 1. Suspected circumferential esophageal wall thickening, potentially accentuated due to underdistention though potentially and esophagitis could have a similar appearance. Clinical correlation is advised. 2. Otherwise, no explanation for patient's centralized non radiating chest pain. Specifically, no evidence of pulmonary embolism. 3. Cardiomegaly with enlargement of the caliber the main pulmonary artery, nonspecific though could be seen in the setting of pulmonary arterial hypertension. Further evaluation cardiac echo could be performed as indicated. 4. Aortic Atherosclerosis (ICD10-I70.0). Electronically Signed   By: Sandi Mariscal M.D.   On: 05/28/2018 17:04   Ct Head Code Stroke Wo Contrast  Result Date: 06/06/2018 CLINICAL DATA:  Code stroke. Initial evaluation for acute altered mental status, right-sided  weakness. EXAM: CT HEAD WITHOUT CONTRAST TECHNIQUE: Contiguous axial images were obtained from the base of the skull through the vertex without intravenous contrast. COMPARISON:  Prior CT from 07/16/2007. FINDINGS: Brain: Cerebral volume within normal limits for age. Patchy hypodensity within the periventricular and deep white matter both cerebral hemispheres, nonspecific, but most like related chronic small vessel ischemic disease. No acute intracranial hemorrhage. There is subtle loss of gray-white matter differentiation involving the anterior/mid left temporal lobe, suspicious for possible developing acute ischemic left MCA territory infarct (series 3, image 15 on axial view, series 6, image 52 on sagittal sequence. This corresponds with M2 region. Gray-white matter differentiation otherwise grossly maintained. No mass lesion, midline shift or mass effect. No hydrocephalus. No extra-axial fluid collection. Vascular: No hyperdense vessel. Scattered vascular calcifications noted within the carotid siphons. Skull: Scalp soft tissues and calvarium within normal limits. Postoperative changes from prior posterior decompression and laminectomy noted at the upper cervical spine with probable associated postoperative seroma. Sinuses/Orbits: Globes and orbital soft tissues within normal limits. Chronic mucosal thickening within the right maxillary sinus. Paranasal sinuses are otherwise largely clear. No mastoid effusion. Other: None. ASPECTS [facility] Stroke Program Early CT Score) - Ganglionic level infarction (caudate, lentiform nuclei, internal capsule, insula, M1-M3 cortex): 6 - Supraganglionic infarction (M4-M6 cortex): 3 Total score (0-10 with 10 being normal): 9 IMPRESSION: 1. Subtle hypodensity involving the anterior/mid left temporal lobe, suspicious for evolving acute ischemic left MCA territory infarct. No associated hemorrhage. 2. ASPECTS is 9. 3. Chronic microvascular ischemic disease. These results were  communicated to Dr. Cheral Marker At 5:48  amon 9/6/2019by text page via the [facility] messaging system. Electronically Signed   By: Jeannine Boga M.D.   On: 06/06/2018 05:51      Bonnielee Haff M.D on 06/18/2018 at 1:20 PM  Between 7am to 7pm - Pager - [phone]  After 7pm go to [email or web address] - password [facility]  [facility] -  Office  [phone]

## 2018-06-19 ENCOUNTER — Other Ambulatory Visit: Payer: Self-pay

## 2018-06-19 ENCOUNTER — Encounter (HOSPITAL_COMMUNITY): Payer: Self-pay | Admitting: *Deleted

## 2018-06-19 ENCOUNTER — Inpatient Hospital Stay (HOSPITAL_COMMUNITY)
Admission: RE | Admit: 2018-06-19 | Discharge: 2018-06-27 | DRG: 057 | Disposition: A | Discharge status: Other Acute Inpatient Hospital | Payer: BLUE CROSS/BLUE SHIELD | Source: Intra-hospital | Attending: Physical Medicine & Rehabilitation | Admitting: Physical Medicine & Rehabilitation

## 2018-06-19 DIAGNOSIS — R2689 Other abnormalities of gait and mobility: Secondary | ICD-10-CM | POA: Diagnosis present

## 2018-06-19 DIAGNOSIS — I2510 Atherosclerotic heart disease of native coronary artery without angina pectoris: Secondary | ICD-10-CM | POA: Diagnosis present

## 2018-06-19 DIAGNOSIS — R1310 Dysphagia, unspecified: Secondary | ICD-10-CM | POA: Diagnosis present

## 2018-06-19 DIAGNOSIS — M4712 Other spondylosis with myelopathy, cervical region: Secondary | ICD-10-CM

## 2018-06-19 DIAGNOSIS — N1832 Chronic kidney disease, stage 3b: Secondary | ICD-10-CM

## 2018-06-19 DIAGNOSIS — N183 Chronic kidney disease, stage 3 (moderate): Secondary | ICD-10-CM

## 2018-06-19 DIAGNOSIS — M5412 Radiculopathy, cervical region: Secondary | ICD-10-CM | POA: Diagnosis present

## 2018-06-19 DIAGNOSIS — I447 Left bundle-branch block, unspecified: Secondary | ICD-10-CM | POA: Diagnosis present

## 2018-06-19 DIAGNOSIS — I129 Hypertensive chronic kidney disease with stage 1 through stage 4 chronic kidney disease, or unspecified chronic kidney disease: Secondary | ICD-10-CM | POA: Diagnosis present

## 2018-06-19 DIAGNOSIS — R4189 Other symptoms and signs involving cognitive functions and awareness: Secondary | ICD-10-CM | POA: Diagnosis present

## 2018-06-19 DIAGNOSIS — I6521 Occlusion and stenosis of right carotid artery: Secondary | ICD-10-CM | POA: Diagnosis not present

## 2018-06-19 DIAGNOSIS — D649 Anemia, unspecified: Secondary | ICD-10-CM | POA: Diagnosis present

## 2018-06-19 DIAGNOSIS — I6523 Occlusion and stenosis of bilateral carotid arteries: Secondary | ICD-10-CM | POA: Diagnosis present

## 2018-06-19 DIAGNOSIS — Z981 Arthrodesis status: Secondary | ICD-10-CM | POA: Diagnosis not present

## 2018-06-19 DIAGNOSIS — I69398 Other sequelae of cerebral infarction: Secondary | ICD-10-CM | POA: Diagnosis not present

## 2018-06-19 DIAGNOSIS — M4802 Spinal stenosis, cervical region: Secondary | ICD-10-CM | POA: Diagnosis present

## 2018-06-19 DIAGNOSIS — G992 Myelopathy in diseases classified elsewhere: Secondary | ICD-10-CM | POA: Diagnosis present

## 2018-06-19 DIAGNOSIS — Z23 Encounter for immunization: Secondary | ICD-10-CM | POA: Diagnosis not present

## 2018-06-19 DIAGNOSIS — K209 Esophagitis, unspecified: Secondary | ICD-10-CM | POA: Diagnosis present

## 2018-06-19 DIAGNOSIS — F17220 Nicotine dependence, chewing tobacco, uncomplicated: Secondary | ICD-10-CM | POA: Diagnosis present

## 2018-06-19 DIAGNOSIS — I429 Cardiomyopathy, unspecified: Secondary | ICD-10-CM | POA: Diagnosis present

## 2018-06-19 DIAGNOSIS — I63511 Cerebral infarction due to unspecified occlusion or stenosis of right middle cerebral artery: Secondary | ICD-10-CM | POA: Diagnosis present

## 2018-06-19 DIAGNOSIS — I6389 Other cerebral infarction: Secondary | ICD-10-CM

## 2018-06-19 DIAGNOSIS — R269 Unspecified abnormalities of gait and mobility: Secondary | ICD-10-CM

## 2018-06-19 DIAGNOSIS — Z8249 Family history of ischemic heart disease and other diseases of the circulatory system: Secondary | ICD-10-CM | POA: Diagnosis not present

## 2018-06-19 LAB — GLUCOSE, CAPILLARY
GLUCOSE-CAPILLARY: 122 mg/dL — AB (ref 70–99)
GLUCOSE-CAPILLARY: 156 mg/dL — AB (ref 70–99)
Glucose-Capillary: 91 mg/dL (ref 70–99)
Glucose-Capillary: 167 mg/dL — ABNORMAL HIGH (ref 70–99)
Glucose-Capillary: 138 mg/dL — ABNORMAL HIGH (ref 70–99)
Glucose-Capillary: 159 mg/dL — ABNORMAL HIGH (ref 70–99)

## 2018-06-19 LAB — CREATININE, SERUM
Creatinine, Ser: 1.2 mg/dL (ref 0.61–1.24)
GFR calc Af Amer: >60 mL/min (ref >60)
GFR calc non Af Amer: 58 mL/min — ABNORMAL LOW (ref >60)

## 2018-06-19 MED ORDER — RESOURCE THICKENUP CLEAR PO POWD
ORAL | Status: DC | PRN
  Filled 2018-06-19: qty 125

## 2018-06-19 MED ORDER — CARVEDILOL 3.125 MG PO TABS
3.1250 mg | ORAL_TABLET | Freq: Two times a day (BID) | ORAL | Status: DC
  Administered 2018-06-19 – 2018-06-26 (×15): 3.125 mg via ORAL
  Filled 2018-06-19 (×15): qty 1

## 2018-06-19 MED ORDER — ATORVASTATIN CALCIUM 80 MG PO TABS
80.0000 mg | ORAL_TABLET | Freq: Every day | ORAL | Status: DC
  Administered 2018-06-19 – 2018-06-26 (×8): 80 mg
  Filled 2018-06-19 (×3): qty 1
  Filled 2018-06-19: qty 2
  Filled 2018-06-19 (×4): qty 1

## 2018-06-19 MED ORDER — CLOPIDOGREL BISULFATE 75 MG PO TABS
75.0000 mg | ORAL_TABLET | Freq: Every day | ORAL | Status: DC
  Administered 2018-06-20 – 2018-06-26 (×7): 75 mg
  Filled 2018-06-19 (×7): qty 1

## 2018-06-19 MED ORDER — ENOXAPARIN SODIUM 40 MG/0.4ML ~~LOC~~ SOLN
40.0000 mg | SUBCUTANEOUS | Status: DC
  Administered 2018-06-20 – 2018-06-26 (×7): 40 mg via SUBCUTANEOUS
  Filled 2018-06-19 (×7): qty 0.4

## 2018-06-19 MED ORDER — ACETAMINOPHEN 325 MG PO TABS
650.0000 mg | ORAL_TABLET | ORAL | Status: DC | PRN
  Administered 2018-06-20: 650 mg via ORAL
  Filled 2018-06-19 (×3): qty 2

## 2018-06-19 MED ORDER — FAMOTIDINE 20 MG PO TABS
20.0000 mg | ORAL_TABLET | Freq: Every day | ORAL | Status: DC
  Administered 2018-06-20 – 2018-06-26 (×7): 20 mg via ORAL
  Filled 2018-06-19 (×7): qty 1

## 2018-06-19 MED ORDER — ASPIRIN 81 MG PO CHEW
162.0000 mg | CHEWABLE_TABLET | Freq: Every day | ORAL | Status: DC
  Administered 2018-06-20 – 2018-06-26 (×7): 162 mg
  Filled 2018-06-19 (×7): qty 2

## 2018-06-19 MED ORDER — ENOXAPARIN SODIUM 40 MG/0.4ML ~~LOC~~ SOLN: 40.0000 mg | SUBCUTANEOUS | Status: DC

## 2018-06-19 MED ORDER — CLONIDINE HCL 0.1 MG/24HR TD PTWK
0.1000 mg | MEDICATED_PATCH | TRANSDERMAL | Status: DC
  Administered 2018-06-26: 0.1 mg via TRANSDERMAL
  Filled 2018-06-19: qty 1

## 2018-06-19 MED ORDER — FAMOTIDINE 20 MG PO TABS
20.0000 mg | ORAL_TABLET | Freq: Every day | ORAL | Status: DC
  Administered 2018-06-19: 20 mg via ORAL
  Filled 2018-06-19: qty 1

## 2018-06-19 NOTE — Progress Notes (Deleted)
To rehab alert patient per bed accompanied by NT. Oriented to unit set up; Fall precaution bundle done by [employer].

## 2018-06-19 NOTE — Progress Notes (Signed)
Patient ID: Ian Ramos, male   DOB: June 04, 1946, 72 y.o.   MRN: [id number] Admit to unit, oriented to rehab , routine, therapy schedule, safety pla and plan of care. States an understanding of information. Pamelia Hoit

## 2018-06-19 NOTE — Progress Notes (Signed)
Inpatient Rehabilitation-Admissions Coordinator   [facility] has received medical approval, family approval, and insurance approval for admit to CIR today. Floor RN, CM, and SW updated on plan. Please call if questions.   [name], OTR/L  Rehab Admissions Coordinator  [phone] 06/19/2018 2:45 PM

## 2018-06-19 NOTE — Progress Notes (Signed)
Pt ready for discharge to IR. Report called Edson Snowball, RN. Pt aware.

## 2018-06-19 NOTE — Discharge Summary (Signed)
[facility]  Physician Discharge Summary   Patient ID: Ian Ramos MRN: [id number] DOB/AGE: 06-29-1946 72 y.o.  Admit date: 05/28/2018 Discharge date: 06/19/2018  PCP: Libby Maw, MD  DISCHARGE DIAGNOSES:  Acute respiratory failure with hypoxia, resolved Acute bilateral CVA Bilateral carotid artery stenosis Chronic systolic CHF Anemia chronic disease Chronic kidney disease stage III    RECOMMENDATIONS FOR OUTPATIENT FOLLOW UP: 1. Patient being discharged to [facility] inpatient rehabilitation. 2. Will need follow-up with neurology for stroke 3. Will need to see neurosurgery, Dr. Annette Stable, for recent cervical spine surgery  DISCHARGE CONDITION: fair  Diet recommendation: Currently on dysphagia 2 diet with nectar thick liquids  Filed Weights   06/17/18 0600 06/18/18 0352 06/19/18 0447  Weight: 78.4 kg 78.9 kg 74.8 kg    INITIAL HISTORY: 72 year old male patient history of stage III chronic kidney disease, cervical spine stenosis status post recent cervical laminectomy August 2019. Presented to the emergency room on 8/28 with chief complaint of chest pain, shoulder pain and substernal discomfort.. Was found to have some esophageal thickening, possibly some esophageal dysmotility, etiology was never clearly identified but resolved. He was found to have new dilated cardiomyopathy with EF 35% and diffuse hypokinesis, and bundle branch block, but it was unclear as to whether or not if this was the primary cause of his discomfort. The plan was to proceed with iliac ischemia evaluation. He underwent cardiac catheterization on 9/5: Showed moderate obstructive coronary artery disease with 75% occluded proximal LAD 75% OM 2 occluded RV marginal branch with left to right collaterals. This was not considered critical CAD and medical management was recommended. On 9/6 the patient developed acute mental status change. On initial eval he was confused, preservation,  and had right upper extremity drift and right visual field cut. CT brain history left temporal lobe infarct felt possibly embolic in etiology. Carotid ultrasound showed likely left carotid occlusion with high-grade right carotid stenosis. Vascular surgery was consulted. Plavix was recommended and delaying carotid endarterectomy for further recovery was felt best course of action. 9/7: Continues to demonstrate aphasia. Also with ongoing left upper extremity weakness. MRI showed right MCA/PCA watershed and right parietal punctate infarcts felt more consistent with high-grade ICA stenosis on the left. 9/8: Patient vomited twice during the day while family was assisting with feeding 9/9: Found unresponsive in chair. Blood pressure 48/32 heart rate 58 area not clear that he lost pulses. Pulse oximetry not recorded. He was apneic, Code blue was called patient was intubated and transferred to the intensive care.  Consults: GI 8/29: Found asymptomatic esophagitis. Also evidence of esophageal dysmotility on esophagram. GI signed off on 8/30 with final recommendations for daily PPI x4 weeks and routine cancer screening Cardiology consulted 9/3 for chest pain. Underwent left heart cath which showed diffuse coronary artery disease but noncritical recommended medical therapy, because he had bradycardia and CKD ARB was held he was recommended to continue hydralazine, nitrates and diuretics 9/7: Neurology consulted for stroke Vascular surgery consulted 9/6: For bilateral carotid artery disease: Started on Plavix. Recommending gentle hydration.  Initial recommendation for stenting within 2 weeks, but given particular intubation, current recommendation is for carotid stenting within 4 to 6 weeks(note was on 06/10/2018)  Consults  : Cardiology, neurology, PCCM vascular surgery  Procedures  :  Left heart cath 9/5 showing moderate noncritical coronary artery disease. 75% occluded proximal LAD 75% occluded  OM. Medical management was recommended  Significant Diagnostic Tests: Echocardiogram 8/29:EF 35 to 40%. Diffuse hypokinesis. Doppler parameters consistent  with grade 1 diastolic dysfunction. 9/6: MRI brainExtensive motion artifact. Minimal flow related signal in the left cervical ICA and absent flow of the left ICA representing occlusion or proximal high-grade stenosis. There is a large anterior communicating artery providing collateral circulation to the left MCA and left PCA. 9/6: CT brain: Subtle hypodensity in the anterior mid left temporal lobe 9/4: Nuclear stress test EF 39% findings were consistent with ischemia felt to be intermittent risk 8/30: Esophagram showing moderate esophageal dysmotility 9/12 extubated, confused   HOSPITAL COURSE:   Acute hypoxemic respiratory failure -Patient required intubation and mechanical ventilation. Unclear at this time what the etiology of his respiratory arrest was. -He was extubated and has significantly improved.  He is currently saturating normal on room air.    Acute bilateral CVA -  Initially felt to be embolic, though with critical bilateral carotid stenosis and hypotension as well as watershed distribution of most infarcts, ICA stenosis is felt to be etiology. Locations include periventricular left M1/M2 distribution, right MCA/PCA distribution, and right temporoparietal cortex with resultant confusion and aphasia.  - On antiplatelet per neurology, statin - HbA1c 6.1%,  Bilateral carotid artery stenosis Right ICA severely occluded (80-99%) and left ICA said to be completely occluded.  - Ideally 130-165mHg SBP  - ASA, plavix, statin -Continue with PT/OT, inpatient rehabilitation consulted -He is followed by vascular surgery, plan for repeat stenting when he improved, very likely after rehab stay.  He will need follow-up appointment with vascular surgery.  He was seen by Dr. FOneida Alar  Cardiomyopathy with LVEF 35 to 40% range and  diffuse hypokinesis by echocardiogram. -Known history of CAD, left cardiac cath 06/05/2018 significant for eccentric 75% proximal LAD stenosis, 75% OM 2 stenosis, and occluded RV marginal branch with left-to-right collaterals.  Plan is medical therapy as per cardiology. Noted to be on beta-blockers, already on clonidine patch, on aspirin, Plavix and statin. Currently not on ACE or ARB given his renal failure.   Cervical myelopathy status post laminectomy Stable.  Has a cervical neck collar.  Dr. PAnnette Stableis his neurosurgeon.  Anemia of chronic disease -Hemoglobin is low but stable.  No evidence for overt bleeding.    Acute on chronic renal insufficiency -Renal function is at baseline.  Acute encephalopathy, acute delirium -Multifactorial, in the setting of CVA, recent surgery, ICU delirium and stay, prolonged hospitalization. -Seems to be close to his baseline now. -Diet has been advanced by SLP.  He was on tube feedings which have been discontinued.  Urinary retention -Foley catheter discontinued, no recurrence of retention  Essential hypertension -Continue with clonidine patch. He was started on low-dose Coreg by cardiology as well.  Overall stable.  Okay for discharge to inpatient rehabilitation.    PERTINENT LABS:  The results of significant diagnostics from this hospitalization (including imaging, microbiology, ancillary and laboratory) are listed below for reference.    Microbiology: Recent Results (from the past 240 hour(s))  Culture, respiratory (non-expectorated)     Status: None   Collection Time: 06/09/18  4:48 PM  Result Value Ref Range Status   Specimen Description TRACHEAL ASPIRATE  Final   Special Requests NONE  Final   Gram Stain   Final    RARE WBC PRESENT, PREDOMINANTLY PMN FEW GRAM POSITIVE COCCI FEW GRAM POSITIVE RODS    Culture   Final    Consistent with normal respiratory flora. Performed at [facility] [id number] [place]   [id number]   Report Status 06/11/2018 FINAL  Final  Labs: Basic Metabolic Panel: Recent Labs  Lab 06/12/18 1648  06/13/18 0435 06/14/18 0214 06/15/18 0719 06/16/18 7893 06/17/18 0441 06/18/18 0539 06/19/18 0436  NA  --    < > 139 137 136 138 138 137  --   K  --    < > 3.7 3.8 4.0 4.2 4.0 3.6  --   CL  --    < > 112* 108 108 107 107 108  --   CO2  --    < > 21* 22 21* 22 23 20*  --   GLUCOSE  --    < > 153* 133* 146* 98 107* 112*  --   BUN  --    < > 34* 33* 28* 24* 29* 26*  --   CREATININE  --    < > 1.25* 1.18 1.22 1.39* 1.44* 1.35* 1.20  CALCIUM  --    < > 7.6* 7.7* 7.6* 8.1* 8.0* 7.6*  --   MG 2.0  --  2.1 1.6*  --  1.7  --   --   --   PHOS 1.5*  --  1.2* 2.4*  --   --   --   --   --    < > = values in this interval not displayed.   CBC: Recent Labs  Lab 06/13/18 0435 06/14/18 0214 06/15/18 0719 06/17/18 0441 06/18/18 0539  WBC 8.0 6.1 6.8 6.4 4.9  HGB 8.6* 8.1* 8.9* 7.8* 7.8*  HCT 26.2* 24.9* 27.3* 24.9* 24.1*  MCV 89.4 91.2 90.7 91.5 89.6  PLT 160 165 173 200 215    CBG: Recent Labs  Lab 06/18/18 1640 06/18/18 2031 06/19/18 0028 06/19/18 0445 06/19/18 0748  GLUCAP 135* 147* 122* 91 167*     IMAGING STUDIES Dg Chest 2 View  Result Date: 05/28/2018 CLINICAL DATA:  Chest pain and weakness for a few days. The patient underwent cervical fusion 05/23/2018. EXAM: CHEST - 2 VIEW COMPARISON:  PA and lateral chest 05/26/2018. FINDINGS: Lung volumes are low with minimal atelectasis in the left base. No pneumothorax or pleural effusion. Heart size is normal. No acute or focal bony abnormality. Degenerative disease about the shoulders noted. IMPRESSION: No acute disease. Electronically Signed   By: Inge Rise M.D.   On: 05/28/2018 11:57   Dg Chest 2 View  Result Date: 05/26/2018 CLINICAL DATA:  72 year old male with weakness and cough. Status post recent cervical spine surgery. EXAM: CHEST - 2 VIEW COMPARISON:  None. FINDINGS: Cardiac and mediastinal  contours are within normal limits. Atherosclerotic calcifications are present within the transverse aorta. Mild linear opacity in the lingula most consistent with atelectasis. No evidence of pulmonary edema, pleural effusion or pneumothorax. No suspicious pulmonary nodule or mass. Degenerative changes are present in both shoulders including the acromioclavicular joints and glenohumeral joints. High-riding humeral heads bilaterally with decreased subacromial space consistent with chronic rotator cuff injuries. No acute osseous abnormality. IMPRESSION: 1. Linear opacity in the lingula most consistent with atelectasis. 2.  Aortic Atherosclerosis (ICD10-170.0) 3. Bilateral degenerative changes in the glenohumeral and acromioclavicular joints. Electronically Signed   By: Jacqulynn Cadet M.D.   On: 05/26/2018 08:01   Dg Cervical Spine 2-3 Views  Result Date: 05/23/2018 CLINICAL DATA:  C1-C6 decompression and fusion. EXAM: CERVICAL SPINE - 2-3 VIEW; DG C-ARM 61-120 MIN COMPARISON:  MRI 01/01/2018 FINDINGS: C-arm images show posterior fusion with lateral mass screws and posterior rods from C1 through C6. No radiographically detectable complication on this limited imaging. IMPRESSION: Posterior  fusion from C1 through C6. Electronically Signed   By: Nelson Chimes M.D.   On: 05/23/2018 16:26   Ct Cervical Spine Wo Contrast  Result Date: 05/28/2018 CLINICAL DATA:  Chest pain.  Recent cervical spine surgery. EXAM: CT CERVICAL SPINE WITHOUT CONTRAST TECHNIQUE: Multidetector CT imaging of the cervical spine was performed without intravenous contrast. Multiplanar CT image reconstructions were also generated. COMPARISON:  MRI cervical spine January 01, 2018 FINDINGS: ALIGNMENT: Straightened cervical lordosis.  No malalignment. SKULL BASE AND VERTEBRAE: Vertebral bodies intact. Interval C1 through C6 laminectomies. Intact well-seated lateral mass screws. The RIGHT C3, C4, C5 and C6 screws are intra-articular. The LEFT C3  through C6 facet screws are intra-articular, the LEFT facet screw extends into the foramen transversarium. The LEFT C6 screw extends into the cephalad aspect of the foramen transversarium. Intact hardware. Non incorporated posterior bone graft material. Stable moderate to severe disc height loss C3-4 through T1-2 with endplate sclerosis and marginal spurring compatible with degenerative discs. Stable geographic subcentimeter cyst and posterior odontoid process without cortical disruption. 1 cm calcified pannus posterior to the odontoid process seen with CPPD without destructive bony changes. SOFT TISSUES AND SPINAL CANAL: And gas within the surgical bed and ventral epidural space at C1-2 consistent with recent surgery. Small prevertebral effusion is likely postoperative. Mild calcific atherosclerosis aortic arch. DISC LEVELS: Canal obscured by hardware streak artifact. Neural foraminal narrowing all cervical levels varying from moderate to severe. UPPER CHEST: Lung apices are clear. OTHER: None. IMPRESSION: 1. Status post recent C1 through C6 laminectomies and posterior instrumentation. Multilevel intra-articular facet screws, LEFT C2 screw in foramen transversarium. 2. Postoperative fluid collection and gas consistent with recent surgery/seroma, pseudomeningocele not excluded. If there is clinical concern for infection, consider sampling. 3. No fracture deformity or malalignment. Aortic Atherosclerosis (ICD10-I70.0). Electronically Signed   By: Elon Alas M.D.   On: 05/28/2018 16:32   [name]  Result Date: 06/06/2018 CLINICAL DATA:  72 y/o M; acute altered mental status and right-sided weakness. Stroke evaluation. EXAM: MRI HEAD WITHOUT CONTRAST MRA HEAD WITHOUT CONTRAST TECHNIQUE: Axial DWI, coronal DWI, axial T2 FLAIR sequences of the head were acquired. Angiographic images of the head were obtained using MRA technique without contrast. The patient was unable to continue and additional  sequences were not acquired. COMPARISON:  06/06/2018 CT head.  05/28/2018 CT cervical spine. FINDINGS: MRI HEAD FINDINGS Brain: Subcentimeter foci of reduced diffusion compatible with acute/early subacute infarction are present, 2 in the left superior frontal lobe, and 2 in the right posterior lateral parietal lobe. No signal abnormality in the left anterior temporal lobe, findings on CT likely related to artifact. Several nonspecific T2 FLAIR hyperintensities in subcortical and periventricular white matter are compatible with mild chronic microvascular ischemic changes for age. Mild volume loss of the brain. No hydrocephalus, extra-axial collection, or herniation. Vascular: As below. Skull and upper cervical spine: Partially visualized cervical spinal fusion and laminectomy. Large fluid collection within the postoperative bed as seen on prior CT of the cervical spine. No reduced diffusion of the visible portions of the fluid collection to indicate infection. Sinuses/Orbits: Negative. Other: None. MRA HEAD FINDINGS Motion degraded study. Anterior circulation: Minimal flow related signal within the left cervical internal carotid artery and no appreciable flow related signal of the left petrous and cavernous segments of the left internal carotid artery. Faint flow related signal within the terminal left ICA, probably retrograde. Right internal carotid artery is patent, moderate to severe stenosis of the right cavernous segment.  Normal flow related signal within the right MCA and bilateral ACA distributions. Large anterior communicating artery largely perfusing the left MCA distribution which is patent. Asymmetry in the MCA distribution signal, left less than right, is likely due to signal loss from delayed transit via A-comm collateralization. Posterior circulation: Faint flow related signal in the left posterior cerebral artery which appears to be a fetal PCA is likely due to signal loss from delayed transit due to  A-comm collateralization from the right ICA. Normal flow related signal within the vertebral arteries, basilar artery, and right PCA. IMPRESSION: MRI head: 1. Diffusion and T2 FLAIR weighted sequences were acquired. The patient was unable to continue and additional sequences were not acquired. 2. Subcentimeter acute/early subacute infarctions, 2 in the left superior frontal lobe, and 2 in the right posterolateral parietal lobe. No associated mass effect. 3. Mild chronic microvascular ischemic changes and volume loss of the brain. MRA head: 1. Extensive motion artifact. 2. Minimal flow related signal in the left cervical ICA and absent flow related signal within the petrous and cavernous segments of left ICA. Findings may represent occlusion or proximal high-grade stenosis. Age indeterminate. 3. Large anterior communicating artery providing collateral circulation to the left MCA and left PCA distributions. Decreased signal in left MCA and left PCA distribution from increased transit time. CTA of the head and neck can better assess vessel stenosis and patency. If clinically indicated consider CT perfusion of the head to assess for brain oligemia. These results will be called to the ordering clinician or representative by the Radiologist Assistant, and communication documented in the PACS or zVision Dashboard. Electronically Signed   By: Kristine Garbe M.D.   On: 06/06/2018 18:14   Dg Esophagus  Result Date: 05/30/2018 CLINICAL DATA:  Esophageal wall thickening on CT EXAM: ESOPHOGRAM/BARIUM SWALLOW TECHNIQUE: Single contrast examination was performed using  thin barium. FLUOROSCOPY TIME:  Fluoroscopy Time:  42 seconds Radiation Exposure Index (if provided by the fluoroscopic device): 12.5 mGy Number of Acquired Spot Images: 8 COMPARISON:  CTA chest dated 05/28/2018 FINDINGS: Limited evaluation due to difficulty with patient positioning and inability to perform double-contrast evaluation. Single contrast  evaluation in the recumbent supine position was performed. No mucosal irregularity to suggest esophageal mass. No fixed esophageal narrowing or stricture. Moderate esophageal dysmotility. No evidence of hiatal hernia. Visualized stomach is grossly unremarkable. IMPRESSION: Moderate esophageal dysmotility. Otherwise negative single-contrast esophagram. Electronically Signed   By: Julian Hy M.D.   On: 05/30/2018 09:30   Nm Myocar Multi W/spect W/wall Motion / Ef  Result Date: 06/04/2018  There was no ST segment deviation noted during stress.  Defect 1: There is a medium defect of moderate severity present in the basal anteroseptal and mid anteroseptal location.  Defect 2: There is a medium defect of severe severity present in the basal inferior and mid inferior location.  This is an intermediate risk study.  Findings consistent with ischemia.  Nuclear stress EF: 39%.  The left ventricular ejection fraction is moderately decreased (30-44%).  Abnormal, intermediate risk stress nuclear study with predominantly fixed anteroseptal defect possibly secondary to left bundle branch block; mild to moderate inferior ischemia; gated ejection fraction 39% with hypokinesis of the septum, mild left ventricular enlargement.   Dg Chest Port 1 View  Result Date: 06/12/2018 CLINICAL DATA:  Respiratory failure EXAM: PORTABLE CHEST 1 VIEW COMPARISON:  06/09/2018 FINDINGS: Endotracheal tube and left subclavian central line are again noted and stable. Feeding catheter has been placed and courses towards the stomach.  Previously seen nasogastric catheter has been removed. Postsurgical changes in the cervical spine are noted. Cardiac shadow is stable. Lungs are well aerated bilaterally without focal infiltrate or sizable effusion. IMPRESSION: Tubes and lines as described above. No acute abnormality noted. Electronically Signed   By: Inez Catalina M.D.   On: 06/12/2018 09:59   Dg Chest Port 1 View  Result Date:  06/09/2018 CLINICAL DATA:  Central line placement confirmation EXAM: PORTABLE CHEST 1 VIEW COMPARISON:  06/09/2018 1645 hours FINDINGS: Endotracheal and NG tubes stable. Left subclavian central venous catheter placed. Tip is in the mid SVC. There is no ensuing left pneumothorax. Normal heart size. Clear and under aerated lungs. IMPRESSION: Left subclavian central venous catheter placement with its tip in the mid SVC and no pneumothorax per Clear lungs. Stable endotracheal and NG tubes. Electronically Signed   By: Marybelle Killings M.D.   On: 06/09/2018 18:09   Dg Chest Port 1 View  Result Date: 06/09/2018 CLINICAL DATA:  72 year old male with acute respiratory failure. Subsequent encounter. EXAM: PORTABLE CHEST 1 VIEW COMPARISON:  05/28/2018 CT and chest x-ray. FINDINGS: Endotracheal tube tip centrally located 4.9 cm above the carina. Nasogastric tube tip gastric fundus level. No infiltrate or congestive heart failure. No obvious pneumothorax. Heart size within normal limits. Calcified mildly tortuous aorta. IMPRESSION: 1. No infiltrate or congestive heart failure. 2.  Aortic Atherosclerosis (ICD10-I70.0). Electronically Signed   By: Genia Del M.D.   On: 06/09/2018 16:57   Dg Abd Portable 1v  Result Date: 06/11/2018 CLINICAL DATA:  Feeding tube placement EXAM: PORTABLE ABDOMEN - 1 VIEW COMPARISON:  06/10/2018 FINDINGS: Feeding tube has been placed with the tip in the distal descending duodenum. Nonobstructive bowel gas pattern. IMPRESSION: Feeding tube in the distal descending duodenum. Electronically Signed   By: Rolm Baptise M.D.   On: 06/11/2018 11:12   Dg Abd Portable 1v  Result Date: 06/10/2018 CLINICAL DATA:  Status post OG tube placement. EXAM: PORTABLE ABDOMEN - 1 VIEW COMPARISON:  None. FINDINGS: OG tube is in place with the tip just within the stomach. Side port is in the distal esophagus. Recommend advancement of 5-6 cm. IMPRESSION: As above. Electronically Signed   By: Inge Rise M.D.    On: 06/10/2018 10:27   Dg C-arm 1-60 Min  Result Date: 05/23/2018 CLINICAL DATA:  C1-C6 decompression and fusion. EXAM: CERVICAL SPINE - 2-3 VIEW; DG C-ARM 61-120 MIN COMPARISON:  MRI 01/01/2018 FINDINGS: C-arm images show posterior fusion with lateral mass screws and posterior rods from C1 through C6. No radiographically detectable complication on this limited imaging. IMPRESSION: Posterior fusion from C1 through C6. Electronically Signed   By: Nelson Chimes M.D.   On: 05/23/2018 16:26   Mr Jodene Nam Head Wo Contrast  Result Date: 06/06/2018 CLINICAL DATA:  72 y/o M; acute altered mental status and right-sided weakness. Stroke evaluation. EXAM: MRI HEAD WITHOUT CONTRAST MRA HEAD WITHOUT CONTRAST TECHNIQUE: Axial DWI, coronal DWI, axial T2 FLAIR sequences of the head were acquired. Angiographic images of the head were obtained using MRA technique without contrast. The patient was unable to continue and additional sequences were not acquired. COMPARISON:  06/06/2018 CT head.  05/28/2018 CT cervical spine. FINDINGS: MRI HEAD FINDINGS Brain: Subcentimeter foci of reduced diffusion compatible with acute/early subacute infarction are present, 2 in the left superior frontal lobe, and 2 in the right posterior lateral parietal lobe. No signal abnormality in the left anterior temporal lobe, findings on CT likely related to artifact. Several nonspecific T2 FLAIR hyperintensities  in subcortical and periventricular white matter are compatible with mild chronic microvascular ischemic changes for age. Mild volume loss of the brain. No hydrocephalus, extra-axial collection, or herniation. Vascular: As below. Skull and upper cervical spine: Partially visualized cervical spinal fusion and laminectomy. Large fluid collection within the postoperative bed as seen on prior CT of the cervical spine. No reduced diffusion of the visible portions of the fluid collection to indicate infection. Sinuses/Orbits: Negative. Other: None. MRA HEAD  FINDINGS Motion degraded study. Anterior circulation: Minimal flow related signal within the left cervical internal carotid artery and no appreciable flow related signal of the left petrous and cavernous segments of the left internal carotid artery. Faint flow related signal within the terminal left ICA, probably retrograde. Right internal carotid artery is patent, moderate to severe stenosis of the right cavernous segment. Normal flow related signal within the right MCA and bilateral ACA distributions. Large anterior communicating artery largely perfusing the left MCA distribution which is patent. Asymmetry in the MCA distribution signal, left less than right, is likely due to signal loss from delayed transit via A-comm collateralization. Posterior circulation: Faint flow related signal in the left posterior cerebral artery which appears to be a fetal PCA is likely due to signal loss from delayed transit due to A-comm collateralization from the right ICA. Normal flow related signal within the vertebral arteries, basilar artery, and right PCA. IMPRESSION: MRI head: 1. Diffusion and T2 FLAIR weighted sequences were acquired. The patient was unable to continue and additional sequences were not acquired. 2. Subcentimeter acute/early subacute infarctions, 2 in the left superior frontal lobe, and 2 in the right posterolateral parietal lobe. No associated mass effect. 3. Mild chronic microvascular ischemic changes and volume loss of the brain. MRA head: 1. Extensive motion artifact. 2. Minimal flow related signal in the left cervical ICA and absent flow related signal within the petrous and cavernous segments of left ICA. Findings may represent occlusion or proximal high-grade stenosis. Age indeterminate. 3. Large anterior communicating artery providing collateral circulation to the left MCA and left PCA distributions. Decreased signal in left MCA and left PCA distribution from increased transit time. CTA of the head and  neck can better assess vessel stenosis and patency. If clinically indicated consider CT perfusion of the head to assess for brain oligemia. These results will be called to the ordering clinician or representative by the Radiologist Assistant, and communication documented in the PACS or zVision Dashboard. Electronically Signed   By: Kristine Garbe M.D.   On: 06/06/2018 18:14   Ct Angio Chest Aorta W And/or Wo Contrast  Result Date: 05/28/2018 CLINICAL DATA:  Centralized non radiating chest pain beginning last evening. Recent spinal surgery. Evaluate for pulmonary embolism. EXAM: CT ANGIOGRAPHY CHEST WITH CONTRAST TECHNIQUE: Multidetector CT imaging of the chest was performed using the standard protocol during bolus administration of intravenous contrast. Multiplanar CT image reconstructions and MIPs were obtained to evaluate the vascular anatomy. CONTRAST:  156m ISOVUE-370 IOPAMIDOL (ISOVUE-370) INJECTION 76% COMPARISON:  Chest radiograph - 05/28/2018 FINDINGS: Vascular Findings: There is adequate opacification of the pulmonary arterial system with the main pulmonary artery measuring 1,251 Hounsfield units. There are no discrete filling defects within the pulmonary arterial tree to suggest pulmonary embolism. Enlarged caliber of the main pulmonary artery measuring 35 mm in diameter. Cardiomegaly.  No pericardial effusion. Minimal amount of atherosclerotic plaque within a normal caliber thoracic aorta. Conventional configuration of the aortic arch. The branch vessels of the aortic arch appear patent throughout their imaged course.  Review of the MIP images confirms the above findings. ---------------------------------------------------------------------------------- Nonvascular Findings: Mediastinum/Lymph Nodes: Potential mild circumferential wall thickening of the esophagus (representative image 46, series 6), potentially accentuated due to underdistention though conceivably esophagitis could have a  similar appearance. No bulky mediastinal, hilar or axillary lymphadenopathy. Lungs/Pleura: Minimal dependent subpleural ground-glass atelectasis. No discrete focal airspace opacities. No pleural effusion or pneumothorax. The central pulmonary airways appear widely patent. No discrete pulmonary nodules. Upper abdomen: Limited early arterial phase evaluation of the upper abdomen demonstrates mild thickening of the left adrenal gland without discrete nodule. Musculoskeletal: Regional soft tissues appear normal. There is a punctate (approximately 0.6 cm) hypoattenuating nodule within the inferior medial aspect the left lobe of the thyroid (image 23, series 6), of doubtful clinical concern. IMPRESSION: 1. Suspected circumferential esophageal wall thickening, potentially accentuated due to underdistention though potentially and esophagitis could have a similar appearance. Clinical correlation is advised. 2. Otherwise, no explanation for patient's centralized non radiating chest pain. Specifically, no evidence of pulmonary embolism. 3. Cardiomegaly with enlargement of the caliber the main pulmonary artery, nonspecific though could be seen in the setting of pulmonary arterial hypertension. Further evaluation cardiac echo could be performed as indicated. 4. Aortic Atherosclerosis (ICD10-I70.0). Electronically Signed   By: Sandi Mariscal M.D.   On: 05/28/2018 17:04   Ct Head Code Stroke Wo Contrast  Result Date: 06/06/2018 CLINICAL DATA:  Code stroke. Initial evaluation for acute altered mental status, right-sided weakness. EXAM: CT HEAD WITHOUT CONTRAST TECHNIQUE: Contiguous axial images were obtained from the base of the skull through the vertex without intravenous contrast. COMPARISON:  Prior CT from 07/16/2007. FINDINGS: Brain: Cerebral volume within normal limits for age. Patchy hypodensity within the periventricular and deep white matter both cerebral hemispheres, nonspecific, but most like related chronic small vessel  ischemic disease. No acute intracranial hemorrhage. There is subtle loss of gray-white matter differentiation involving the anterior/mid left temporal lobe, suspicious for possible developing acute ischemic left MCA territory infarct (series 3, image 15 on axial view, series 6, image 52 on sagittal sequence. This corresponds with M2 region. Gray-white matter differentiation otherwise grossly maintained. No mass lesion, midline shift or mass effect. No hydrocephalus. No extra-axial fluid collection. Vascular: No hyperdense vessel. Scattered vascular calcifications noted within the carotid siphons. Skull: Scalp soft tissues and calvarium within normal limits. Postoperative changes from prior posterior decompression and laminectomy noted at the upper cervical spine with probable associated postoperative seroma. Sinuses/Orbits: Globes and orbital soft tissues within normal limits. Chronic mucosal thickening within the right maxillary sinus. Paranasal sinuses are otherwise largely clear. No mastoid effusion. Other: None. ASPECTS [facility] Stroke Program Early CT Score) - Ganglionic level infarction (caudate, lentiform nuclei, internal capsule, insula, M1-M3 cortex): 6 - Supraganglionic infarction (M4-M6 cortex): 3 Total score (0-10 with 10 being normal): 9 IMPRESSION: 1. Subtle hypodensity involving the anterior/mid left temporal lobe, suspicious for evolving acute ischemic left MCA territory infarct. No associated hemorrhage. 2. ASPECTS is 9. 3. Chronic microvascular ischemic disease. These results were communicated to Dr. Cheral Marker At 5:48 amon 9/6/2019by text page via the [facility] messaging system. Electronically Signed   By: Jeannine Boga M.D.   On: 06/06/2018 05:51    DISCHARGE EXAMINATION: Vitals:   06/19/18 0000 06/19/18 0447 06/19/18 0800 06/19/18 0902  BP: 137/65 133/61  126/66  Pulse: 68 64  66  Resp: 19 (!) 23 17 (!) 23  Temp: 99 F (37.2 C) 98 F (36.7 C)  97.8 F (36.6 C)  TempSrc: Oral Oral   Oral  SpO2: 98% 96%  96%  Weight:  74.8 kg    Height:       General appearance: alert, cooperative, appears stated age and no distress Resp: Normal effort.  Coarse breath sounds but clear to auscultation otherwise. Cardio: regular rate and rhythm, S1, S2 normal, no murmur, click, rub or gallop GI: soft, non-tender; bowel sounds normal; no masses,  no organomegaly  DISPOSITION: Inpatient rehabilitation  Discharge Instructions    Ambulatory referral to Neurology   Complete by:  As directed    Follow up with stroke clinic NP (Jessica Vanschaick or Cecille Rubin, if both not available, consider Zachery Dauer, or [facility]) at [facility] in about 4 weeks. Thanks.       Current Inpatient Medications:  Scheduled: .  stroke: mapping our early stages of recovery book   Does not apply Once  . aspirin  162 mg Per Tube Daily  . atorvastatin  80 mg Per Tube q1800  . carvedilol  3.125 mg Oral BID WC  . chlorhexidine gluconate (MEDLINE KIT)  15 mL Mouth Rinse BID  . cloNIDine  0.1 mg Transdermal Weekly  . clopidogrel  75 mg Per Tube Daily  . enoxaparin (LOVENOX) injection  40 mg Subcutaneous Q24H  . famotidine  20 mg Oral Daily  . insulin aspart  0-15 Units Subcutaneous Q4H  . iopamidol  100 mL Intravenous Once  . mouth rinse  15 mL Mouth Rinse BID  . sodium chloride flush  3 mL Intravenous Q12H   Continuous: . sodium chloride 10 mL/hr at 06/09/18 1610  . sodium chloride 75 mL/hr at 06/19/18 0600   ZOX:WRUEAV chloride, acetaminophen, acetaminophen, fentaNYL (SUBLIMAZE) injection, RESOURCE THICKENUP CLEAR, sodium chloride flush   TOTAL DISCHARGE TIME: 35 mins  [facility] Pager [phone]  06/19/2018, 11:11 AM

## 2018-06-19 NOTE — Progress Notes (Signed)
PMR Admission Coordinator Pre-Admission Assessment  Patient: Ian Ramos is an 72 y.o., male MRN: [id number] DOB: [birth year] Height: 5' 7.5" (171.5 cm) Weight: 74.8 kg                                                                                                                                                  Insurance Information HMO:     PPO: Yes     PCP:      IPA:      80/20:      OTHER:  PRIMARY: BCBS      Policy#: [id number]      Subscriber: Patient CM Name: Lean      Phone#: [phone]     Fax#: [phone]  Auth provided by Denny Peon at [facility] on 06/18/18 for admit to CIR on 06/19/18. Clinical updates are due 06/24/18 using rehab tool; Per CM, please include statement from acute PT regarding tolerance for CIR during first clinical update. Fax only department.  Pre-Cert#:  [id number]      Employer:  Benefits:  Phone #: NA     Name: [email or web address] Eff. Date: 10/08/10     Deduct: $200 (met $200)      Out of Pocket Max: $1,000 (Met $186.40)      Life Max:  CIR: 80%/20%      SNF: 69%/45% necessity pre-cert with network Outpatient: 100% first day, 80%/20% any treatment after; necessity pre-cert with network   Co-Pay: (see "Outpatient") 20% if treatment after day 1. Home Health: 80%, necessity pre-cert with network      Co-Pay: 20% DME: 80%     Co-Pay: 20% Providers:  SECONDARY: Medicare Part A and B      Policy#: [id number]      Subscriber: Patient CM Name:       Phone#:      Fax#:  Pre-Cert#:       Employer:  Benefits:  Phone #:      Name:  Eff. Date:      Deduct:       Out of Pocket Max:       Life Max:  CIR:       SNF:  Outpatient:      Co-Pay:  Home Health:       Co-Pay:  DME:      Co-Pay:   Medicaid Application Date:       Case Manager:  Disability Application Date:       Case Worker:   Emergency Contact Information         Contact Information    Name Relation Home Work [place] Significant other [phone]  [phone]   [facility] Daughter    [phone]   Cleon Dew Sister [phone]  [phone]     Current Medical History  Patient Admitting Diagnosis: Pt with cervical stenosis and myelopathy s/p  surgical decompression and fusion over a week ago. Presented with chest pain/left shoulder/neck pain. Pain may be related to C4 or C5 radiculopathy.  History of Present Illness:Ian Ramos is a 72 year old right-handed male with history of CKD stage III and cervical stenosis with myelopathy who underwent cervical decompression and fusion 05/23/2018 by Dr. Annette Stable and discharged home 05/25/2018 ambulating at a supervision level 300 feet with rolling walker.Supportive care of significant other.Had been using a rolling walker since recent cervical surgery. One level home with 2 steps to entry. He was readmitted 05/28/2018 with chest pain and left shoulder pain. Troponin negative, mildly elevated creatinine 1.39. Chest x-ray no acute disease. Neurosurgery follow-up felt that chest and left shoulder pain related to persistent C5 radiculopathy. CT cervical spine showed no fracture deformity or malalignment. Postoperative fluid collection and gas consistent with recent surgery. CT angiogram of chest showing some potential esophagitis. Esophagram was negative after GI follow-up and signed off. Echocardiogram with ejection fraction of 40% diffuse hypokinesis grade 1 diastolic dysfunction. EKG showed left bundle branch block no ischemic changes. Cardiology service follow-up for question EKG changes with cardiac Myoview study completed completed 06/04/2018 with findings consistent with ischemia ejection fraction 39%. Underwent cardiac catheterization 06/05/2018 showing occluded acute marginal, 75% proximal LAD, 75% OM 2 and 30% proximal to mid RCA. Medical management was recommended and was cleared to begin aspirin and Plavix. Post cardiac catheterization with lethargy and altered mental status changes. Rapid response was called.  CT/MRI showed subcentimeter acute early subacute infarctions, 2 in the left superior frontal lobe and 2 in the right posterior lateral parietal lobe. No associated mass-effect. MRA with minimal flow related signal in the left cervical ICA and absent flow related signal within the petrous and cavernous segments of the left ICA. Neurology service is consulted for follow-up. Carotid Doppler showed right ICA 80 to 90% stenosis left ICA occluded. On 06/09/2018 patient found unresponsive in his chair blood pressures systolic in the 16X heart rate in the 50s. A code was called patient received atropine was intubated for respiratory support. Patient's creatinine did bump up to 1.98 BUN 36 placed on gentle IV fluids. Vascular surgery/Dr. Oneida Alar consulted for severe ICA disease tentatively planning for transfemoral carotid stent and await plan for procedure. Presently on a dysphagia #2 honey thick liquid diet. Therapy evaluations have been completed and ongoing with recommendations of physical medicine rehab consult. Patient is to be admitted for a comprehensive rehab program on 06/19/18.   Complete NIHSS TOTAL: 6  Past Medical History      Past Medical History:  Diagnosis Date  . Arthritis   . Neuromuscular disorder ([place])     Family History  family history includes CAD in his father; Heart failure in his father and mother; Hypertension in his other.  Prior Rehab/Hospitalizations:  Has the patient had major surgery during 100 days prior to admission? Yes  Current Medications   Current Facility-Administered Medications:  .   stroke: mapping our early stages of recovery book, , Does not apply, Once, Biby, Sharon L, NP .  0.9 %  sodium chloride infusion, 250 mL, Intravenous, PRN, [place], Peter M, MD, Last Rate: 10 mL/hr at 06/09/18 1610 .  0.9 %  sodium chloride infusion, , Intravenous, Continuous, Erick Colace, NP, Last Rate: 75 mL/hr at 06/19/18 0600 .  acetaminophen (TYLENOL)  suppository 650 mg, 650 mg, Rectal, Q4H PRN, Elgergawy, Silver Huguenin, MD, 650 mg at 06/16/18 2152 .  acetaminophen (TYLENOL) tablet 650 mg, 650 mg, Oral, Q4H  PRN, [place], Peter M, MD, 650 mg at 06/07/18 0910 .  aspirin chewable tablet 162 mg, 162 mg, Per Tube, Daily, Rush Farmer, MD, 162 mg at 06/19/18 0908 .  atorvastatin (LIPITOR) tablet 80 mg, 80 mg, Per Tube, q1800, Erick Colace, NP, 80 mg at 06/18/18 1714 .  carvedilol (COREG) tablet 3.125 mg, 3.125 mg, Oral, BID WC, Satira Sark, MD, 3.125 mg at 06/19/18 0908 .  chlorhexidine gluconate (MEDLINE KIT) (PERIDEX) 0.12 % solution 15 mL, 15 mL, Mouth Rinse, BID, Vance Gather B, MD, 15 mL at 06/18/18 2000 .  cloNIDine (CATAPRES - Dosed in mg/24 hr) patch 0.1 mg, 0.1 mg, Transdermal, Weekly, Rush Farmer, MD, 0.1 mg at 06/12/18 1100 .  clopidogrel (PLAVIX) tablet 75 mg, 75 mg, Per Tube, Daily, Rush Farmer, MD, 75 mg at 06/19/18 0908 .  enoxaparin (LOVENOX) injection 40 mg, 40 mg, Subcutaneous, Q24H, [place], Peter M, MD, 40 mg at 06/19/18 0909 .  famotidine (PEPCID) tablet 20 mg, 20 mg, Oral, Daily, Kris Mouton, RPH, 20 mg at 06/19/18 5170 .  fentaNYL (SUBLIMAZE) injection 50 mcg, 50 mcg, Intravenous, Q2H PRN, Erick Colace, NP, 50 mcg at 06/12/18 0844 .  insulin aspart (novoLOG) injection 0-15 Units, 0-15 Units, Subcutaneous, Q4H, Icard, Bradley L, DO, 3 Units at 06/19/18 0909 .  iopamidol (ISOVUE-370) 76 % injection 100 mL, 100 mL, Intravenous, Once, [place], Peter M, MD .  MEDLINE mouth rinse, 15 mL, Mouth Rinse, BID, Rush Farmer, MD, 15 mL at 06/18/18 2200 .  RESOURCE THICKENUP CLEAR, , Oral, PRN, Rush Farmer, MD .  sodium chloride flush (NS) 0.9 % injection 3 mL, 3 mL, Intravenous, Q12H, [place], Peter M, MD, 3 mL at 06/18/18 2200 .  sodium chloride flush (NS) 0.9 % injection 3 mL, 3 mL, Intravenous, PRN, [place], Peter M, MD  Facility-Administered Medications Ordered in Other Encounters:  .  etomidate (AMIDATE)  injection, , , Anesthesia Intra-op, Myna Bright, CRNA, 12 mg at 06/09/18 1445 .  succinylcholine (ANECTINE) injection, , , Anesthesia Intra-op, Myna Bright, CRNA, 100 mg at 06/09/18 1445  Patients Current Diet:     Diet Order                  DIET DYS 2 Room service appropriate? Yes; Fluid consistency: Nectar Thick  Diet effective now               Precautions / Restrictions Precautions Precautions: Fall, Cervical Precaution Booklet Issued: No Precaution Comments: reviewed cervical precautions Cervical Brace: Soft collar, At all times Restrictions Weight Bearing Restrictions: No   Has the patient had 2 or more falls or a fall with injury in the past year?Yes  Prior Activity Level Community (5-7x/wk): full time worker; active; has farm   Development worker, international aid / [place] Devices/Equipment: None Home Equipment: Environmental consultant - 2 wheels  Prior Device Use: Indicate devices/aids used by the patient prior to current illness, exacerbation or injury? None of the above  Prior Functional Level Prior Function Level of Independence: Independent with assistive device(s) Comments: Has been using RW since d/c from cervical surgery.   Self Care: Did the patient need help bathing, dressing, using the toilet or eating?  Independent  Indoor Mobility: Did the patient need assistance with walking from room to room (with or without device)? Independent  Stairs: Did the patient need assistance with internal or external stairs (with or without device)? Independent  Functional Cognition: Did the patient need  help planning regular tasks such as shopping or remembering to take medications? Independent  Current Functional Level Cognition  Arousal/Alertness: Awake/alert Overall Cognitive Status: Impaired/Different from baseline Current Attention Level: Sustained Orientation Level: Oriented to person, Disoriented to place, Disoriented to time,  Disoriented to situation Following Commands: Follows one step commands with increased time, Follows one step commands consistently Safety/Judgement: Decreased awareness of safety, Decreased awareness of deficits General Comments: Pt requires increased time and cues to initiate and perform all task. Attention: Focused Focused Attention: Impaired Focused Attention Impairment: Verbal basic Memory: Impaired Memory Impairment: Storage deficit, Decreased recall of new information Awareness: Appears intact Problem Solving: Impaired Problem Solving Impairment: Verbal basic Safety/Judgment: Impaired    Extremity Assessment (includes Sensation/Coordination)  Upper Extremity Assessment: Generalized weakness, RUE deficits/detail, LUE deficits/detail RUE Deficits / Details: pt slow to move R UE and needs hand over hand (A) to place hand on stomach RUE Sensation: decreased light touch RUE Coordination: decreased fine motor, decreased gross motor LUE Deficits / Details: poor grasp and decr strength  demonstrating 3 out 5 with bed mobility due to against gravity but not formally tested LUE Sensation: decreased light touch LUE Coordination: decreased fine motor, decreased gross motor  Lower Extremity Assessment: Defer to PT evaluation    ADLs  Overall ADL's : Needs assistance/impaired Eating/Feeding: Total assistance, Bed level Eating/Feeding Details (indicate cue type and reason): RN feeding patient on arriavl Grooming: Total assistance, Sitting Grooming Details (indicate cue type and reason): total (A) to comb hair  Upper Body Bathing: Maximal assistance Lower Body Bathing: Total assistance Upper Body Dressing : Maximal assistance Lower Body Dressing: Total assistance Toilet Transfer: Moderate assistance, Stand-pivot, RW(Simulated to recliner) Toilet Transfer Details (indicate cue type and reason): Mod A for power up and balance to pivot to recliner Toileting- Clothing Manipulation and  Hygiene: Minimal assistance(standing) Toileting - Clothing Manipulation Details (indicate cue type and reason): incontinent of bowel on arrival and unaware. pt does not ask for (A) Functional mobility during ADLs: Moderate assistance, Rolling walker(Stand pivot only) General ADL Comments: Pt requires (A) to elevate from surface and to pivot to chair. pt does not intiate.     Mobility  Overal bed mobility: Needs Assistance Bed Mobility: Rolling, Sidelying to Sit Rolling: Max assist Sidelying to sit: Max assist, Mod assist Supine to sit: Mod assist Sit to sidelying: Min guard General bed mobility comments: Assist to bring shoulders over, elevate trunk into sitting and bring hips to EOB.    Transfers  Overall transfer level: Needs assistance Equipment used: Rolling walker (2 wheeled) Transfer via Lift Equipment: Stedy Transfers: Sit to/from Stand Sit to Stand: Mod assist, +2 safety/equipment Stand pivot transfers: Max assist, +2 physical assistance, +2 safety/equipment, From elevated surface General transfer comment: Assist to bring hips and trunk up. Pt with delay initiating and needs extra time especially to fully extend trunk. Allowed pt to place hands on walker to encourage anterior weight shift.    Ambulation / Gait / Stairs / Wheelchair Mobility  Ambulation/Gait Ambulation/Gait assistance: Mod assist, +2 safety/equipment, Min assist Gait Distance (Feet): 60 Feet Assistive device: Rolling walker (2 wheeled) Gait Pattern/deviations: Step-to pattern, Decreased step length - right, Decreased step length - left, Shuffle, Trunk flexed General Gait Details: Initially required mod assist due to balance.  As distance progressed pt required min assist. Verbal/tactile cues to stand more erect and stay closer to the walker.  Gait velocity: Decreased Gait velocity interpretation: <1.8 ft/sec, indicate of risk for recurrent falls    Posture /  Balance Dynamic Sitting Balance Sitting  balance - Comments: Sat EOB with supervision x 5-6 minutes Balance Overall balance assessment: Needs assistance Sitting-balance support: Feet supported, No upper extremity supported Sitting balance-Leahy Scale: Fair Sitting balance - Comments: Sat EOB with supervision x 5-6 minutes Postural control: Posterior lean Standing balance support: Bilateral upper extremity supported, During functional activity Standing balance-Leahy Scale: Poor Standing balance comment: walker and min assist for static standing. Stood x 3-4 minutes to be cleaned due to incontinent of BM.    Special needs/care consideration BiPAP/CPAP: no CPM: no Continuous Drip IV: no Dialysis: no        Days: no Life Vest: no Oxygen: no Special Bed: no Trach Size: no Wound Vac (area): no      Location: no Skin: abrasion to Right posterior thigh region, blister to left lateral flank, ecchymosis to abdomen, moisture associated damage to bilateral buttocks, right anterior hand skin tear, closed incision on neck.                      Bowel mgmt: last BM: 06/17/18 Bladder mgmt: external catheter in place Diabetic mgmt: no     Previous Home Environment Living Arrangements: Other relatives(Sister)  Lives With: Alone Available Help at Discharge: Family, Available PRN/intermittently Type of Home: House Home Layout: One level Home Access: Stairs to enter Entrance Stairs-Rails: None Entrance Stairs-Number of Steps: 2 Bathroom Shower/Tub: Tub/shower unit, Industrial/product designer: No Home Care Services: No  Discharge Living Setting Plans for Discharge Living Setting: Patient's home(significant other offered to stay with him for 24/7 support) Type of Home at Discharge: House Discharge Home Layout: One level Discharge Home Access: Stairs to enter Entrance Stairs-Rails: None Entrance Stairs-Number of Steps: 2 Discharge Bathroom Shower/Tub: Tub/shower unit Discharge Bathroom Toilet:  Standard Discharge Bathroom Accessibility: Yes How Accessible: Accessible via walker Does the patient have any problems obtaining your medications?: No  Social/Family/Support Systems Patient Roles: Other (Comment)(full time truck Ecologist) Contact Information: significant other is emergency contact Horris Latino) Anticipated Caregiver: Horris Latino  Anticipated Caregiver's Contact Information: Horris Latino: [phone]; [phone] Ability/Limitations of Caregiver: can provide physical assist as needed Caregiver Availability: 24/7 Discharge Plan Discussed with Primary Caregiver: Sande Brothers ) Is Caregiver In Agreement with Plan?: Yes Does Caregiver/Family have Issues with Lodging/Transportation while Pt is in Rehab?: No   Goals/Additional Needs Patient/Family Goal for Rehab: PT: Min A; OT: Min A; SLP: Supervision Expected length of stay: 12-15 days Cultural Considerations: NA Dietary Needs: DYS 2, nectar thick Equipment Needs: TBD Pt/Family Agrees to Admission and willing to participate: Yes Program Orientation Provided & Reviewed with Pt/Caregiver Including Roles  & Responsibilities: Yes(pt and significant other )  Barriers to Discharge: Home environment access/layout  Barriers to Discharge Comments: 2 steps to enter home; nmo hand rails; tub shower    Decrease burden of Care through IP rehab admission: NA  Possible need for SNF placement upon discharge: Not anticipated; pt has good prognosis for further progress with good social support at home   Patient Condition: This patient's medical and functional status has changed since the consult dated: 05/31/18 in which the Rehabilitation Physician determined and documented that the patient's condition is appropriate for intensive rehabilitative care in an inpatient rehabilitation facility. See "History of Present Illness" (above) for medical update. Functional changes are: decline in functional ability since consult as well as decline  in overall medical condition since consult. During consult, pt was Min Ax2 for transfers and ambulation up to 3 feet. Currently,  pt is Mod A x2 with ambulation up to 60 feet. Patient's medical and functional status update has been discussed with the Rehabilitation physician and patient remains appropriate for inpatient rehabilitation. Will admit to inpatient rehab today.  Preadmission Screen Completed By:  Jhonnie Garner, 06/19/2018 11:44 AM ______________________________________________________________________   Discussed status with Dr. Letta Pate on 06/19/18 at 2:00PM and received telephone approval for admission today.  Admission Coordinator:  Jhonnie Garner, time 2:00PM Sudie Grumbling 06/19/18.           Cosigned by: Charlett Blake, MD at 06/19/2018 3:07 PM  Revision History

## 2018-06-19 NOTE — Progress Notes (Signed)
Physical Medicine and Rehabilitation Consult Reason for Consult: left shoulder pain, weakness Referring Physician: Blake Divine   HPI: Ian Ramos is a 72 y.o. male with a history of CKD III and cervical stenosis/myelopathy who underwent cervical decompression and fusion a week ago. He was re-admitted on 05/28/18 with chest pain. NS feels that chest/left shoulder pain is related to persistent C5 radiculopathy. CT revealed potential esophagitis. Esophagram was negative, and GI has signed off. He has been working with therapy, and pain has been a major inhibiting factor. PM&R was consulted to assess for potential inpatient rehab needs.      Review of Systems  Constitutional: Negative for chills.  HENT: Negative for hearing loss.   Eyes: Negative for double vision.  Respiratory: Positive for cough.   Cardiovascular: Positive for chest pain.  Gastrointestinal: Negative for nausea and vomiting.  Genitourinary: Negative for urgency.  Musculoskeletal: Positive for joint pain, myalgias and neck pain.  Skin: Negative for rash.  Neurological: Positive for focal weakness and headaches.  Psychiatric/Behavioral: Negative for depression.       Past Medical History:  Diagnosis Date  . Arthritis   . Neuromuscular disorder [facility])         Past Surgical History:  Procedure Laterality Date  . POSTERIOR CERVICAL FUSION/FORAMINOTOMY N/A 05/23/2018   Procedure: Posterior Cervical Fusion with lateral mass fixation - C1 - C6 with laminectomy;  Surgeon: Julio Sicks, MD;  Location: [facility] OR;  Service: Neurosurgery;  Laterality: N/A;        Family History  Problem Relation Age of Onset  . Hypertension Other    Social History:  reports that he has never smoked. His smokeless tobacco use includes chew. He reports that he does not drink alcohol or use drugs. Allergies: No Known Allergies       Medications Prior to Admission  Medication Sig Dispense Refill  . HYDROcodone-acetaminophen (NORCO)  10-325 MG tablet Take 1 tablet by mouth every 6 (six) hours as needed for moderate pain.    Marland Kitchen ibuprofen (ADVIL,MOTRIN) 200 MG tablet Take 400 mg by mouth every 6 (six) hours as needed for moderate pain.      Home: Home Living Family/patient expects to be discharged to:: Private residence Living Arrangements: Other relatives(Sister) Available Help at Discharge: Family, Available PRN/intermittently Type of Home: House Home Access: Stairs to enter [employer] of Steps: 2 Entrance Stairs-Rails: None Home Layout: One level Bathroom Shower/Tub: Tub/shower unit, Engineer, building services: Standard Bathroom Accessibility: No Home Equipment: Environmental consultant - 2 wheels  Functional History: Prior Function Level of Independence: Independent with assistive device(s) Comments: Has been using RW since d/c from cervical surgery.  Functional Status:  Mobility: Bed Mobility Overal bed mobility: Needs Assistance Bed Mobility: Rolling, Sidelying to Sit, Sit to Sidelying Rolling: Min guard Sidelying to sit: Min assist(trunk elevation) Sit to sidelying: Min assist(BLE back into bed) General bed mobility comments: Pt able to sit EOB for approx 1 min for urination Transfers Overall transfer level: Needs assistance Equipment used: Rolling walker (2 wheeled) Transfers: Sit to/from Stand Sit to Stand: Min assist, +2 safety/equipment General transfer comment: not attempted without 2nd person to assist Ambulation/Gait Ambulation/Gait assistance: Min assist, +2 safety/equipment Gait Distance (Feet): 3 Feet Assistive device: Rolling walker (2 wheeled) Gait Pattern/deviations: Step-to pattern General Gait Details: patient able to take 3 steps forward and 3 steps backward before pain increased to point he could not continue.    ADL: ADL Overall ADL's : Needs assistance/impaired Eating/Feeding: Moderate assistance, Bed level Eating/Feeding Details (indicate  cue type and reason): sister assisting  in the room Grooming: Set up, Bed level Upper Body Bathing: Moderate assistance, Bed level Lower Body Bathing: Maximal assistance, Bed level Upper Body Dressing : Maximal assistance Lower Body Dressing: Total assistance, Bed level Toilet Transfer: (unable at this time) Toilet Transfer Details (indicate cue type and reason): min A to sit EOB, unable to urinate in urinal due to pain Functional mobility during ADLs: (unable this session, unsafe without 2 assist) General ADL Comments: Pt open to continued education for compensatory strategies and maximizing independence in ADL  Cognition: Cognition Overall Cognitive Status: Within Functional Limits for tasks assessed Orientation Level: Oriented X4 Cognition Arousal/Alertness: Awake/alert Behavior During Therapy: WFL for tasks assessed/performed Overall Cognitive Status: Within Functional Limits for tasks assessed Area of Impairment: Problem solving, Safety/judgement Safety/Judgement: Decreased awareness of safety, Decreased awareness of deficits Problem Solving: Slow processing, Requires verbal cues General Comments: Pt very unaware of safety and deficits and required multiple cues not to throw himself into supine to avoid jolting his neck.   Blood pressure (!) 149/73, pulse 66, temperature 97.8 F (36.6 C), temperature source Oral, resp. rate 15, height 5' 7.5" (1.715 m), weight 73.4 kg, SpO2 99 %. Physical Exam  LabResultsLast24Hours  No results found for this or any previous visit (from the past 24 hour(s)).   ImagingResults(Last48hours)  No results found.    Assessment/Plan: Diagnosis: Pt with cervical stenosis and myelopathy s/p surgical decompression and fusion over a week ago. Presented with chest pain/left shoulder/neck pain. Pain may be related to C4 or C5 radiculopathy.  1. Does the need for close, 24 hr/day medical supervision in concert with the patient's rehab needs make it unreasonable for this patient to be  served in a less intensive setting? Potentially 2. Co-Morbidities requiring supervision/potential complications: CKD, LBBB 3. Due to bladder management, bowel management, safety, skin/wound care, disease management, medication administration, pain management and patient education, does the patient require 24 hr/day rehab nursing? Potentially 4. Does the patient require coordinated care of a physician, rehab nurse, PT (1-2 hrs/day, 5 days/week) and OT (1-2 hrs/day, 5 days/week) to address physical and functional deficits in the context of the above medical diagnosis(es)? Potentially Addressing deficits in the following areas: balance, endurance, locomotion, strength, transferring, bowel/bladder control, bathing, dressing, feeding, grooming, toileting and psychosocial support 5. Can the patient actively participate in an intensive therapy program of at least 3 hrs of therapy per day at least 5 days per week? Potentially 6. The potential for patient to make measurable gains while on inpatient rehab is fair 7. Anticipated functional outcomes upon discharge from inpatient rehab are modified independent  with PT, modified independent and supervision with OT, n/a with SLP. 8. Estimated rehab length of stay to reach the above functional goals is: TBD 9. Anticipated D/C setting: Home 10. Anticipated post D/C treatments: HH therapy 11. Overall Rehab/Functional Prognosis: excellent  RECOMMENDATIONS: This patient's condition is appropriate for continued rehabilitative care in the following setting: see below Patient has agreed to participate in recommended program. Yes Note that insurance prior authorization may be required for reimbursement for recommended care.  Comment: Pt with chronic left shoulder weakness due to his cervical stenosis, radiculopathy. Was doing well up until Wednesday of last week. Now with increased shoulder and neck pain. Eliminate the pain, and he would otherwise be doing well. Will  follow for improvement in pain levels, consider brief CIR admit depending upon his pain and functional progress.    Ranelle Oyster, MD 06/01/2018  Routing History

## 2018-06-19 NOTE — Progress Notes (Signed)
Physical Therapy Treatment Patient Details Name: Ian Ramos MRN: [id number] DOB: 1946/06/01 Today's Date: 06/19/2018    History of Present Illness Pt is a 72 y/o male admitted secondary to intractable chest and L shoulder pain. Neurosurgery feels this is radicular irritation of his left-sided C5 nerve root and started pt on steroids and neurontin. Pt with recent cervical fusion (05/23/18). Imaging negative for PE and showed probable esophagitis. Pt underwent cardiac cath on 9/5. In the AM of 9/6 pt with acute change of mental status and code stroke called. Pt found to have left temporal lobe infarct and right MCA/PCA watershed and right parietal punctate infarcts. Pt also find to have significant carotid stenosis.   Pt coded June 30, 2023 and was intubated.  Extubated 06/12/18.  PMH includes gout and recent Cervical fusion.     PT Comments    Pt continues to make steady progress with mobility, balance, and cognition. Able to amb in hallway. Affect is still flat but pt now giving multiword responses to questions at times instead of 1 word responses. Continue to recommend CIR. Feel pt is able to tolerate 3 hours therapy/day. Anticipate pt will need 24 hour assist at time of DC.   Follow Up Recommendations  CIR;Supervision/Assistance - 24 hour     Equipment Recommendations  Other (comment)(TBA at next venue)    Recommendations for Other Services       Precautions / Restrictions Precautions Precautions: Fall;Cervical Precaution Booklet Issued: No Precaution Comments: reviewed cervical precautions Required Braces or Orthoses: Cervical Brace Cervical Brace: Soft collar;At all times Restrictions Weight Bearing Restrictions: No    Mobility  Bed Mobility Overal bed mobility: Needs Assistance Bed Mobility: Rolling;Sidelying to Sit Rolling: Max assist Sidelying to sit: Max assist;Mod assist       General bed mobility comments: Assist to bring shoulders over, elevate trunk into sitting and  bring hips to EOB.  Transfers Overall transfer level: Needs assistance Equipment used: Rolling walker (2 wheeled) Transfers: Sit to/from Stand Sit to Stand: Mod assist;+2 safety/equipment         General transfer comment: Assist to bring hips and trunk up. Pt with delay initiating and needs extra time especially to fully extend trunk. Allowed pt to place hands on walker to encourage anterior weight shift.  Ambulation/Gait Ambulation/Gait assistance: Mod assist;+2 safety/equipment;Min assist Gait Distance (Feet): 60 Feet Assistive device: Rolling walker (2 wheeled) Gait Pattern/deviations: Step-to pattern;Decreased step length - right;Decreased step length - left;Shuffle;Trunk flexed Gait velocity: Decreased Gait velocity interpretation: <1.8 ft/sec, indicate of risk for recurrent falls General Gait Details: Initially required mod assist due to balance.  As distance progressed pt required min assist. Verbal/tactile cues to stand more erect and stay closer to the walker.    Stairs             Wheelchair Mobility    Modified Rankin (Stroke Patients Only) Modified Rankin (Stroke Patients Only) Pre-Morbid Rankin Score: Moderately severe disability Modified Rankin: Moderately severe disability     Balance Overall balance assessment: Needs assistance Sitting-balance support: Feet supported;No upper extremity supported Sitting balance-Leahy Scale: Fair Sitting balance - Comments: Sat EOB with supervision x 5-6 minutes   Standing balance support: Bilateral upper extremity supported;During functional activity Standing balance-Leahy Scale: Poor Standing balance comment: walker and min assist for static standing. Stood x 3-4 minutes to be cleaned due to incontinent of BM.  Cognition Arousal/Alertness: Awake/alert Behavior During Therapy: Flat affect Overall Cognitive Status: Impaired/Different from baseline Area of Impairment: Problem  solving;Awareness;Safety/judgement;Orientation;Attention;Memory;Following commands                 Orientation Level: Disoriented to;Time;Situation Current Attention Level: Sustained Memory: Decreased recall of precautions;Decreased short-term memory Following Commands: Follows one step commands with increased time;Follows one step commands consistently Safety/Judgement: Decreased awareness of safety;Decreased awareness of deficits Awareness: Intellectual Problem Solving: Difficulty sequencing;Requires verbal cues;Slow processing;Decreased initiation;Requires tactile cues General Comments: Pt requires increased time and cues to initiate and perform all task.      Exercises      General Comments General comments (skin integrity, edema, etc.): VSS      Pertinent Vitals/Pain Pain Assessment: No/denies pain    Home Living                      Prior Function            PT Goals (current goals can now be found in the care plan section) Progress towards PT goals: Progressing toward goals    Frequency    Min 3X/week      PT Plan Current plan remains appropriate    Co-evaluation              AM-PAC PT "6 Clicks" Daily Activity  Outcome Measure  Difficulty turning over in bed (including adjusting bedclothes, sheets and blankets)?: Unable Difficulty moving from lying on back to sitting on the side of the bed? : Unable Difficulty sitting down on and standing up from a chair with arms (e.g., wheelchair, bedside commode, etc,.)?: Unable Help needed moving to and from a bed to chair (including a wheelchair)?: A Lot Help needed walking in hospital room?: A Little Help needed climbing 3-5 steps with a railing? : Total 6 Click Score: 9    End of Session Equipment Utilized During Treatment: Gait belt;Cervical collar Activity Tolerance: Patient tolerated treatment well Patient left: with call bell/phone within reach;in chair;with chair alarm set Nurse  Communication: Mobility status PT Visit Diagnosis: Unsteadiness on feet (R26.81);Muscle weakness (generalized) (M62.81);Difficulty in walking, not elsewhere classified (R26.2);Other symptoms and signs involving the nervous system (R29.898)     Time: 4847-2072 PT Time Calculation (min) (ACUTE ONLY): 22 min  Charges:  $Gait Training: 8-22 mins                     [facility] PT Acute Rehabilitation Services Pager [phone] Office [phone]    Angelina Ok [facility] 06/19/2018, 10:33 AM

## 2018-06-19 NOTE — H&P (Signed)
Physical Medicine and Rehabilitation Admission H&P        Chief Complaint  Patient presents with  . Chest Pain  : HPI: Ian Ramos is a 72 year old right-handed male with history of CKD stage III and cervical stenosis with myelopathy who underwent cervical decompression and fusion 05/23/2018 by Dr. Annette Stable and discharged home 05/25/2018 ambulating at a supervision level 300 feet with rolling walker.  Per chart review patient lives with sister.  Supportive care of significant other.  Had been using a rolling walker since recent cervical surgery.  One level home with 2 steps to entry.  He was readmitted 05/28/2018 with chest pain and left shoulder pain.  Troponin negative, mildly elevated creatinine 1.39.  Chest x-ray no acute disease.  Neurosurgery follow-up felt that chest and left shoulder pain related to persistent C5 radiculopathy.  CT cervical spine showed no fracture deformity or malalignment.  Postoperative fluid collection and gas consistent with recent surgery.  CT angiogram of chest showing some potential esophagitis.  Esophagram was negative after GI follow-up and signed off.  Echocardiogram with ejection fraction of 40% diffuse hypokinesis grade 1 diastolic dysfunction.  EKG showed left bundle branch block no ischemic changes.  Cardiology service follow-up for question EKG changes with cardiac Myoview study completed completed 06/04/2018 with findings consistent with ischemia ejection fraction 39%.  Underwent cardiac catheterization 06/05/2018 showing occluded acute marginal, 75% proximal LAD, 75% OM 2 and 30% proximal to mid RCA.  Medical management was recommended and was cleared to begin aspirin and Plavix.  Post cardiac catheterization with lethargy and altered mental status changes.  Rapid response was called.  CT/MRI showed subcentimeter acute early subacute infarctions, 2 in the left superior frontal lobe and 2 in the right posterior lateral parietal lobe.  No associated mass-effect.  MRA  with minimal flow related signal in the left cervical ICA and absent flow related signal within the petrous and cavernous segments of the left ICA.  Neurology service is consulted for follow-up.  Carotid Doppler showed right ICA 80 to 90% stenosis left ICA occluded.  On 06/09/2018 patient found unresponsive in his chair blood pressures systolic in the 76H heart rate in the 50s.  A code was called patient received atropine was intubated for respiratory support.  Patient's creatinine did bump up to 1.98 BUN 36 placed on gentle IV fluids.  Vascular surgery/Dr. Oneida Alar consulted for severe ICA disease tentatively planning for transfemoral carotid stent and await plan for procedure.  Presently on a dysphagia #2 honey thick liquid diet.  Therapy evaluations have been completed and ongoing with recommendations of physical medicine rehab consult.  Patient was admitted for a comprehensive rehab program.   Review of Systems  Constitutional: Negative for fever.  HENT: Negative for hearing loss.   Eyes: Negative for blurred vision and double vision.  Respiratory: Positive for cough.   Cardiovascular: Positive for chest pain.  Gastrointestinal: Positive for constipation. Negative for nausea and vomiting.  Genitourinary: Positive for urgency. Negative for dysuria, flank pain and hematuria.  Musculoskeletal: Positive for joint pain and myalgias.  Skin: Negative for rash.  Neurological: Positive for focal weakness and headaches.  All other systems reviewed and are negative.       Past Medical History:  Diagnosis Date  . Arthritis    . Neuromuscular disorder [facility])           Past Surgical History:  Procedure Laterality Date  . LEFT HEART CATH AND CORONARY ANGIOGRAPHY N/A 06/05/2018    Procedure: LEFT HEART  CATH AND CORONARY ANGIOGRAPHY;  Surgeon: Martinique, Peter M, MD;  Location: [facility];  Service: Cardiovascular;  Laterality: N/A;  . POSTERIOR CERVICAL FUSION/FORAMINOTOMY N/A 05/23/2018    Procedure:  Posterior Cervical Fusion with lateral mass fixation - C1 - C6 with laminectomy;  Surgeon: Earnie Larsson, MD;  Location: [facility];  Service: Neurosurgery;  Laterality: N/A;         Family History  Problem Relation Age of Onset  . Hypertension Other    . Heart failure Mother    . CAD Father          s/p CABG in his 8s  . Heart failure Father      Social History:  reports that he has never smoked. His smokeless tobacco use includes chew. He reports that he does not drink alcohol or use drugs. Allergies: No Known Allergies       Medications Prior to Admission  Medication Sig Dispense Refill  . HYDROcodone-acetaminophen (NORCO) 10-325 MG tablet Take 1 tablet by mouth every 6 (six) hours as needed for moderate pain.      Marland Kitchen ibuprofen (ADVIL,MOTRIN) 200 MG tablet Take 400 mg by mouth every 6 (six) hours as needed for moderate pain.          Drug Regimen Review Drug regimen was reviewed and remains appropriate with no significant issues identified   Home: Home Living Family/patient expects to be discharged to:: Private residence Living Arrangements: Other relatives(Sister) Available Help at Discharge: Family, Available PRN/intermittently Type of Home: House Home Access: Stairs to enter [employer] of Steps: 2 Entrance Stairs-Rails: None Home Layout: One level Bathroom Shower/Tub: Tub/shower unit, Architectural technologist: Standard Bathroom Accessibility: No Home Equipment: Environmental consultant - 2 wheels  Lives With: Alone   Functional History: Prior Function Level of Independence: Independent with assistive device(s) Comments: Has been using RW since d/c from cervical surgery.    Functional Status:  Mobility: Bed Mobility Overal bed mobility: Needs Assistance Bed Mobility: Rolling, Supine to Sit Rolling: Max assist Sidelying to sit: Max assist Supine to sit: Mod assist Sit to sidelying: Min guard General bed mobility comments: Pt rolling R and L x3 each for peri care . pt  requires multimodal cues for task. pt with initiation on the third attempt. pt requires (A) to elevate trunk from surface. Pt does attempt to shift weight to place R foot on floor with cue Transfers Overall transfer level: Needs assistance Equipment used: Rolling walker (2 wheeled), Ambulation equipment used Transfer via Lift Equipment: Stedy Transfers: Sit to/from Stand, Risk manager Sit to Stand: +2 physical assistance, Mod assist, From elevated surface Stand pivot transfers: Max assist, +2 physical assistance, +2 safety/equipment, From elevated surface General transfer comment: Pt requires total +2 and pad at hips for extension and (A) to pivot. Pt does not initiate steps. pt requires physical weight shift and lifting of R LE to transfer to chair Ambulation/Gait Ambulation/Gait assistance: +2 physical assistance, Mod assist Gait Distance (Feet): 4 Feet Assistive device: Rolling walker (2 wheeled) Gait Pattern/deviations: Step-to pattern, Decreased step length - right, Decreased step length - left, Shuffle, Trunk flexed General Gait Details: Assist for support and balance. Verbal/tactile cues to stand more erect. Close follow with recliner behind patient. Gait velocity: Decreased Gait velocity interpretation: <1.8 ft/sec, indicate of risk for recurrent falls   ADL: ADL Overall ADL's : Needs assistance/impaired Eating/Feeding: Total assistance, Bed level Eating/Feeding Details (indicate cue type and reason): RN feeding patient on arriavl Grooming: Total assistance, Sitting Grooming  Details (indicate cue type and reason): total (A) to comb hair  Upper Body Bathing: Maximal assistance Lower Body Bathing: Total assistance Upper Body Dressing : Maximal assistance Lower Body Dressing: Total assistance Toilet Transfer: Moderate assistance, Stand-pivot, RW(Simulated to recliner) Toilet Transfer Details (indicate cue type and reason): Mod A for power up and balance to pivot to  recliner Toileting- Clothing Manipulation and Hygiene: Minimal assistance(standing) Toileting - Clothing Manipulation Details (indicate cue type and reason): incontinent of bowel on arrival and unaware. pt does not ask for (A) Functional mobility during ADLs: Moderate assistance, Rolling walker(Stand pivot only) General ADL Comments: Pt requires (A) to elevate from surface and to pivot to chair. pt does not intiate.    Cognition: Cognition Overall Cognitive Status: Impaired/Different from baseline Arousal/Alertness: Awake/alert Orientation Level: Oriented to person, Other (comment)(place "[place]", President "Trump", Year "2018") Attention: Focused Focused Attention: Impaired Focused Attention Impairment: Verbal basic Memory: Impaired Memory Impairment: Storage deficit, Decreased recall of new information Awareness: Appears intact Problem Solving: Impaired Problem Solving Impairment: Verbal basic Safety/Judgment: Impaired Cognition Arousal/Alertness: Awake/alert Behavior During Therapy: Flat affect Overall Cognitive Status: Impaired/Different from baseline Area of Impairment: Attention, Memory, Following commands, Safety/judgement, Awareness Orientation Level: Disoriented to, Place, Time, Situation Current Attention Level: Focused Memory: Decreased recall of precautions, Decreased short-term memory Following Commands: Follows one step commands inconsistently, Follows one step commands with increased time Safety/Judgement: Decreased awareness of safety, Decreased awareness of deficits Awareness: Intellectual Problem Solving: Slow processing, Decreased initiation, Difficulty sequencing, Requires verbal cues, Requires tactile cues General Comments: Pt requires multi modal cues to initiate all task. pt requires physical (A) for initiate of knee flexion . pt has a generalized response to all questions or statements. "not right now" is an example of one response he repeats.    Physical  Exam: Blood pressure (!) 136/56, pulse 70, temperature (!) 97.5 F (36.4 C), temperature source Axillary, resp. rate 16, height 5' 7.5" (1.715 m), weight 78.9 kg, SpO2 98 %. Physical Exam  Vitals reviewed. Eyes: EOM are normal.  Neck: Normal range of motion. Neck supple. No thyromegaly present.  Cardiovascular: Normal rate and regular rhythm.  Respiratory: Effort normal and breath sounds normal. No respiratory distress.  GI: Soft. Bowel sounds are normal. He exhibits no distension.  Neurological: He is alert.  Anxious   Follows simple commands  Skin: Skin is warm and dry.   Motor strength is 2/5 bilateral deltoid 3 bilateral biceps 3/5 bilateral finger flexors and extensors 4/5 bilateral hip flexor knee extensor ankle dorsiflexors. Sensation reduced to light touch in both hands however question whether patient has good attention.  Lower extremity has intact sensation in the feet. Oriented to person, "facility in [place]", not to hospital or time   Lab Results Last 48 Hours        Results for orders placed or performed during the hospital encounter of 05/28/18 (from the past 48 hour(s))  Glucose, capillary     Status: Abnormal    Collection Time: 06/16/18 11:57 AM  Result Value Ref Range    Glucose-Capillary 140 (H) 70 - 99 mg/dL  Glucose, capillary     Status: Abnormal    Collection Time: 06/16/18  5:24 PM  Result Value Ref Range    Glucose-Capillary 114 (H) 70 - 99 mg/dL  Glucose, capillary     Status: Abnormal    Collection Time: 06/16/18  8:16 PM  Result Value Ref Range    Glucose-Capillary 168 (H) 70 - 99 mg/dL  Glucose, capillary  Status: None    Collection Time: 06/17/18 12:43 AM  Result Value Ref Range    Glucose-Capillary 95 70 - 99 mg/dL  Glucose, capillary     Status: Abnormal    Collection Time: 06/17/18  3:55 AM  Result Value Ref Range    Glucose-Capillary 104 (H) 70 - 99 mg/dL  CBC     Status: Abnormal    Collection Time: 06/17/18  4:41 AM  Result Value  Ref Range    WBC 6.4 4.0 - 10.5 K/uL    RBC 2.72 (L) 4.22 - 5.81 MIL/uL    Hemoglobin 7.8 (L) 13.0 - 17.0 g/dL    HCT 24.9 (L) 39.0 - 52.0 %    MCV 91.5 78.0 - 100.0 fL    MCH 28.7 26.0 - 34.0 pg    MCHC 31.3 30.0 - 36.0 g/dL    RDW 12.2 11.5 - 15.5 %    Platelets 200 150 - 400 K/uL      Comment: Performed at [facility], [place]. [address] [place], [place] [address]  Basic metabolic panel     Status: Abnormal    Collection Time: 06/17/18  4:41 AM  Result Value Ref Range    Sodium 138 135 - 145 mmol/L    Potassium 4.0 3.5 - 5.1 mmol/L      Comment: SPECIMEN HEMOLYZED. HEMOLYSIS MAY AFFECT INTEGRITY OF RESULTS.    Chloride 107 98 - 111 mmol/L    CO2 23 22 - 32 mmol/L    Glucose, Bld 107 (H) 70 - 99 mg/dL    BUN 29 (H) 8 - 23 mg/dL    Creatinine, Ser 1.44 (H) 0.61 - 1.24 mg/dL    Calcium 8.0 (L) 8.9 - 10.3 mg/dL    GFR calc non Af Amer 47 (L) >60 mL/min    GFR calc Af Amer 54 (L) >60 mL/min      Comment: (NOTE) The eGFR has been calculated using the CKD EPI equation. This calculation has not been validated in all clinical situations. eGFR's persistently <60 mL/min signify possible Chronic Kidney Disease.      Anion gap 8 5 - 15      Comment: Performed at [facility] [address] [place], [place] [address]  Glucose, capillary     Status: Abnormal    Collection Time: 06/17/18  7:48 AM  Result Value Ref Range    Glucose-Capillary 103 (H) 70 - 99 mg/dL  Glucose, capillary     Status: Abnormal    Collection Time: 06/17/18 11:28 AM  Result Value Ref Range    Glucose-Capillary 104 (H) 70 - 99 mg/dL  Glucose, capillary     Status: Abnormal    Collection Time: 06/17/18  4:04 PM  Result Value Ref Range    Glucose-Capillary 156 (H) 70 - 99 mg/dL  Glucose, capillary     Status: Abnormal    Collection Time: 06/17/18  7:50 PM  Result Value Ref Range    Glucose-Capillary 137 (H) 70 - 99 mg/dL  Glucose, capillary     Status: Abnormal    Collection Time: 06/18/18 12:12  AM  Result Value Ref Range    Glucose-Capillary 137 (H) 70 - 99 mg/dL  Glucose, capillary     Status: Abnormal    Collection Time: 06/18/18  3:49 AM  Result Value Ref Range    Glucose-Capillary 107 (H) 70 - 99 mg/dL  CBC     Status: Abnormal    Collection Time: 06/18/18  5:39 AM  Result Value Ref Range    WBC 4.9 4.0 - 10.5 K/uL    RBC 2.69 (L) 4.22 - 5.81 MIL/uL    Hemoglobin 7.8 (L) 13.0 - 17.0 g/dL    HCT 24.1 (L) 39.0 - 52.0 %    MCV 89.6 78.0 - 100.0 fL    MCH 29.0 26.0 - 34.0 pg    MCHC 32.4 30.0 - 36.0 g/dL    RDW 11.9 11.5 - 15.5 %    Platelets 215 150 - 400 K/uL      Comment: Performed at [facility] [address], [place], [place] [address]  Basic metabolic panel     Status: Abnormal    Collection Time: 06/18/18  5:39 AM  Result Value Ref Range    Sodium 137 135 - 145 mmol/L    Potassium 3.6 3.5 - 5.1 mmol/L    Chloride 108 98 - 111 mmol/L    CO2 20 (L) 22 - 32 mmol/L    Glucose, Bld 112 (H) 70 - 99 mg/dL    BUN 26 (H) 8 - 23 mg/dL    Creatinine, Ser 1.35 (H) 0.61 - 1.24 mg/dL    Calcium 7.6 (L) 8.9 - 10.3 mg/dL    GFR calc non Af Amer 50 (L) >60 mL/min    GFR calc Af Amer 59 (L) >60 mL/min      Comment: (NOTE) The eGFR has been calculated using the CKD EPI equation. This calculation has not been validated in all clinical situations. eGFR's persistently <60 mL/min signify possible Chronic Kidney Disease.      Anion gap 9 5 - 15      Comment: Performed at [facility] [address] [place], [place] [address]  Glucose, capillary     Status: Abnormal    Collection Time: 06/18/18  7:57 AM  Result Value Ref Range    Glucose-Capillary 110 (H) 70 - 99 mg/dL      Imaging Results (Last 48 hours)  No results found.           Medical Problem List and Plan: 1.  Decreased functional mobility secondary to cervical stenosis with myelopathy status post surgical decompression and fusion 6/55/3748 complicated by right MCA/PCA watershed and right  parietal punctate infarcts after cardiac catheterization 06/05/2018 2.  DVT Prophylaxis/Anticoagulation: Subcutaneous Lovenox.  Monitor for any bleeding episodes 3. Pain Management: Tylenol as needed 4. Mood: Provide emotional support 5. Neuropsych: This patient is capable of making decisions on his own behalf. 6. Skin/Wound Care: Routine skin checks 7. Fluids/Electrolytes/Nutrition: Routine in and outs with follow-up chemistries 8.  CAD with cardiomyopathy.  Medical management.  Continue aspirin and Plavix 9.  ICA stenosis.  Follow-up vascular surgery await plan procedure for carotid stenting 10.  CKD stage III.  Follow-up chemistries 11.  Hypertension.  Coreg 3.125 mg twice daily, clonidine patch 0.1 mg weekly 12.  Esophagitis.  Pepcid 13.  Dysphagia.  Dysphasia #2 honey thick liquids.  Monitor hydration    Post Admission Physician Evaluation: Functional deficits secondary  to cervical stenosis with myelopathy status post surgical decompression and fusion 2/70/7867 complicated by right MCA/PCA watershed and right parietal punctate infarcts after cardiac catheterization 06/05/2018 1.  . 2. Patient admitted to receive collaborative, interdisciplinary care between the physiatrist, rehab nursing staff, and therapy team. 3. Patient's level of medical complexity and substantial therapy needs in context of that medical necessity cannot be provided at a lesser intensity of care. 4. Patient has experienced  substantial functional loss from his/her baseline.Judging by the patient's diagnosis, physical exam, and functional history, the patient has potential for functional progress which will result in measurable gains while on inpatient rehab.  These gains will be of substantial and practical use upon discharge in facilitating mobility and self-care at the household level. 5. Physiatrist will provide 24 hour management of medical needs as well as oversight of the therapy plan/treatment and provide guidance as  appropriate regarding the interaction of the two. 6. 24 hour rehab nursing will assist in the management of  bladder management, bowel management, safety, skin/wound care, disease management, medication administration, pain management and patient education  and help integrate therapy concepts, techniques,education, etc. 7. PT will assess and treat for:pre gait, gait training, endurance , safety, equipment, neuromuscular re education  .  Goals are: contact guard. 8. OT will assess and treat for ADLs, Cognitive perceptual skills, Neuromuscular re education, safety, endurance, equipment  .  Goals are: contact guard.  9. SLP will assess and treat for attention, concentration, problem-solving, thought organization, orientation, medication management  .  Goals are: supervision. 10. Case Management and Social Worker will assess and treat for psychological issues and discharge planning. 11. Team conference will be held weekly to assess progress toward goals and to determine barriers to discharge. 12.  Patient will receive at least 3 hours of therapy per day at least 5 days per week. 13. ELOS and Prognosis: 14-17days good      "I have personally performed a face to face diagnostic evaluation of this patient.  Additionally, I have reviewed and concur with the physician assistant's documentation above."  Charlett Blake M.D. [facility] FAAPM&R (Sports Med, Neuromuscular Med) Diplomate Am Board of Electrodiagnostic Med  Elizabeth Sauer 06/18/2018

## 2018-06-20 ENCOUNTER — Inpatient Hospital Stay (HOSPITAL_COMMUNITY): Payer: BLUE CROSS/BLUE SHIELD | Admitting: Physical Therapy

## 2018-06-20 ENCOUNTER — Inpatient Hospital Stay (HOSPITAL_COMMUNITY): Payer: BLUE CROSS/BLUE SHIELD

## 2018-06-20 ENCOUNTER — Inpatient Hospital Stay (HOSPITAL_COMMUNITY): Payer: BLUE CROSS/BLUE SHIELD | Admitting: Speech Pathology

## 2018-06-20 LAB — CBC WITH DIFFERENTIAL/PLATELET
ABS IMMATURE GRANULOCYTES: 0.1 10*3/uL (ref 0.0–0.1)
BASOS ABS: 0 10*3/uL (ref 0.0–0.1)
BASOS PCT: 0 %
Eosinophils Absolute: 0.2 10*3/uL (ref 0.0–0.7)
Eosinophils Relative: 3 %
HCT: 27.2 % — ABNORMAL LOW (ref 39.0–52.0)
Hemoglobin: 8.8 g/dL — ABNORMAL LOW (ref 13.0–17.0)
Immature Granulocytes: 1 %
Lymphocytes Relative: 16 %
Lymphs Abs: 0.8 10*3/uL (ref 0.7–4.0)
MCH: 29 pg (ref 26.0–34.0)
MCHC: 32.4 g/dL (ref 30.0–36.0)
MCV: 89.8 fL (ref 78.0–100.0)
MONO ABS: 0.5 10*3/uL (ref 0.1–1.0)
Monocytes Relative: 10 %
Neutro Abs: 3.5 10*3/uL (ref 1.7–7.7)
Neutrophils Relative %: 70 %
PLATELETS: 267 10*3/uL (ref 150–400)
RBC: 3.03 MIL/uL — ABNORMAL LOW (ref 4.22–5.81)
RDW: 11.8 % (ref 11.5–15.5)
WBC: 5 10*3/uL (ref 4.0–10.5)

## 2018-06-20 LAB — COMPREHENSIVE METABOLIC PANEL
ALK PHOS: 50 U/L (ref 38–126)
ALT: 27 U/L (ref 0–44)
ANION GAP: 8 (ref 5–15)
AST: 20 U/L (ref 15–41)
Albumin: 2.2 g/dL — ABNORMAL LOW (ref 3.5–5.0)
BILIRUBIN TOTAL: 0.6 mg/dL (ref 0.3–1.2)
BUN: 19 mg/dL (ref 8–23)
CALCIUM: 8 mg/dL — AB (ref 8.9–10.3)
CO2: 23 mmol/L (ref 22–32)
Chloride: 105 mmol/L (ref 98–111)
Creatinine, Ser: 1.23 mg/dL (ref 0.61–1.24)
GFR calc non Af Amer: 56 mL/min — ABNORMAL LOW (ref >60)
GLUCOSE: 122 mg/dL — AB (ref 70–99)
Potassium: 4.1 mmol/L (ref 3.5–5.1)
Sodium: 136 mmol/L (ref 135–145)
TOTAL PROTEIN: 5 g/dL — AB (ref 6.5–8.1)

## 2018-06-20 LAB — GLUCOSE, CAPILLARY
GLUCOSE-CAPILLARY: 117 mg/dL — AB (ref 70–99)
Glucose-Capillary: 119 mg/dL — ABNORMAL HIGH (ref 70–99)
Glucose-Capillary: 112 mg/dL — ABNORMAL HIGH (ref 70–99)

## 2018-06-20 MED ORDER — SENNA 8.6 MG PO TABS
2.0000 | ORAL_TABLET | Freq: Every evening | ORAL | Status: DC | PRN
  Administered 2018-06-25: 17.2 mg via ORAL
  Filled 2018-06-20: qty 2

## 2018-06-20 MED ORDER — BISACODYL 10 MG RE SUPP
10.0000 mg | Freq: Every day | RECTAL | Status: DC | PRN
  Administered 2018-06-26: 10 mg via RECTAL
  Filled 2018-06-20: qty 1

## 2018-06-20 NOTE — Progress Notes (Signed)
Social Work  Social Work Assessment and Plan  Patient Details  Name: Ian Ramos MRN: [id number] Date of Birth: [birth year]  Today's Date: 06/20/2018  Problem List:  Patient Active Problem List   Diagnosis Date Noted  . Right middle cerebral artery stroke (HCC) 06/19/2018  . Spondylosis, cervical, with myelopathy   . Neurologic gait disorder   . Essential hypertension   . Acute encephalopathy   . Acute respiratory failure (HCC)   . Acute bilat watershed infarction [facility])   . On mechanically assisted ventilation (HCC)   . Acute systolic CHF (congestive heart failure) (HCC)   . Coronary artery disease due to lipid rich plaque   . DCM (dilated cardiomyopathy) (HCC)   . Intractable pain 05/28/2018  . Chest pain 05/28/2018  . CKD (chronic kidney disease) stage 3, GFR 30-59 ml/min (HCC) 05/28/2018  . Esophageal thickening 05/28/2018  . LBBB (left bundle branch block) 05/28/2018  . Postoperative anemia 05/28/2018  . Status post cervical spinal fusion 05/23/2018  . Cervical myelopathy (HCC) 05/23/2018  . Left arm weakness 09/20/2017  . Brachial plexopathy 09/16/2017  . Need for pneumococcal vaccination 09/16/2017  . Need for influenza vaccination 09/16/2017   Past Medical History:  Past Medical History:  Diagnosis Date  . Arthritis   . Neuromuscular disorder [facility])    Past Surgical History:  Past Surgical History:  Procedure Laterality Date  . LEFT HEART CATH AND CORONARY ANGIOGRAPHY N/A 06/05/2018   Procedure: LEFT HEART CATH AND CORONARY ANGIOGRAPHY;  Surgeon: Swaziland, Peter M, MD;  Location: [facility] INVASIVE CV LAB;  Service: Cardiovascular;  Laterality: N/A;  . POSTERIOR CERVICAL FUSION/FORAMINOTOMY N/A 05/23/2018   Procedure: Posterior Cervical Fusion with lateral mass fixation - C1 - C6 with laminectomy;  Surgeon: Julio Sicks, MD;  Location: [facility] OR;  Service: Neurosurgery;  Laterality: N/A;   Social History:  reports that he has never smoked. His smokeless tobacco use includes  chew. He reports that he does not drink alcohol or use drugs.  Family / Support Systems Marital Status: Divorced Spouse/Significant Other: girlfriend, Melida Quitter ("together" ~ 5 yrs) @ (H) [phone] or (C) [phone] Children: daughter, Jerral Bonito (local) @ (C) [phone] Other Supports: sister, Levy Pupa [place], [place]) @ (H) [phone] or [phone] Anticipated Caregiver: Kendal Hymen  Ability/Limitations of Caregiver: Kendal Hymen is able to assist but does work p/t.  She is able to provide 24/7 initially if needed Caregiver Availability: 24/7 Family Dynamics: Patient reports his sister, Sedalia Muta, is very supportive.  Notes limited contact with daughter who lives locally and notes he has another daughter living in [place].  Patient's girlfriend is aware that patient has designated her as primary contact but asked that sister be involved as well.  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 Legal Hisotry/Current Legal Issues: None Guardian/Conservator: None-Per MD, patient is capable of making decisions on his own behalf.   Abuse/Neglect Abuse/Neglect Assessment Can Be Completed: Yes Physical Abuse: Denies Verbal Abuse: Denies Sexual Abuse: Denies Exploitation of patient/patient's resources: Denies Self-Neglect: Denies  Emotional Status Pt's affect, behavior adn adjustment status: Patient sitting up in a wheelchair and agreeable to assessment interview, however, he offers only brief answers and difficult to engage.  Affect is flat and makes little eye contact throughout the interview.  Girlfriend notes that "he is  never been a talker.  If you get a yes or a no from him you are doing good."  Have referred for neuropsychology consult  for additional input and support as needed. Recent Psychosocial Issues: None Pyschiatric History: None Substance Abuse History: None  Patient / Family Perceptions, Expectations & Goals Pt/Family understanding of illness & functional limitations: Patient and girlfriend with basic understanding of medical issues that have occurred since his cervical surgery including a stroke.  General awareness and appreciation for his current functional limitations and understand need for CIR. Premorbid pt/family roles/activities: Patient was completely independent PTA.  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 Anticipated changes in roles/activities/participation: Per goals of supervision and overall, patient will need some assistance from girlfriend.  Anticipated role changes TBD. 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  Community Resources [employer]: None Premorbid Home Care/DME Agencies: None Transportation available at discharge: Yes Resource referrals recommended: Neuropsychology  Discharge Planning Living Arrangements: Alone Support Systems: Children, Other relatives, Friends/neighbors Type of Residence: Private residence Insurance Resources: [employer], Media planner (specify)([employer]) Financial Resources: Employment, Restaurant manager, fast food Screen Referred: No Living Expenses: Own Money Management: Patient Does the patient have any problems obtaining your medications?: No Home Management: Patient was independent PTA Patient/Family Preliminary Plans: Patient return to his own home with girlfriend and sister helping as needed. Social Work Anticipated Follow Up Needs: HH/OP Expected length of stay: 7-10 days  Clinical Impression Unfortunate gentleman here following a stroke which occurred following a recent cardiac workup and cervical  surgery.  Patient difficult to engage, however, does complete basic assessment interview.  Patient has good support from local girlfriend who notes she is very aware that he is hoping he can return to work and to resume care of his large farm and heads of cattle.  Patient denies any significant emotional distress.  Flat affect and little eye contact and have referred for neuropsychology for further evaluation.  Social work to follow for support and discharge planning needs.  Damaya Channing 06/20/2018, 4:20 PM

## 2018-06-20 NOTE — IPOC Note (Signed)
Overall Plan of Care [facility]) Patient Details Name: Ian Ramos MRN: [id number] DOB: 02-01-1946  Admitting Diagnosis: <principal problem not specified>  Hospital Problems: Active Problems:   CKD (chronic kidney disease) stage 3, GFR 30-59 ml/min (HCC)   Acute bilat watershed infarction [facility])   Spondylosis, cervical, with myelopathy   Neurologic gait disorder   Right middle cerebral artery stroke [facility])     Functional Problem List: Nursing Bladder, Edema, Safety, Endurance, Medication Management  PT Balance, Skin Integrity, Endurance, Motor, Safety, Pain  OT Balance, Endurance, Behavior, Cognition, Motor, Safety  SLP Cognition  TR         Basic ADL's: OT Eating, Grooming, Dressing, Toileting, Bathing     Advanced  ADL's: OT       Transfers: PT Bed Mobility, Bed to Chair, Car, Occupational psychologist, Research scientist (life sciences): PT Ambulation, Stairs     Additional Impairments: OT Fuctional Use of Upper Extremity  SLP Swallowing, Social Cognition   Social Interaction, Problem Solving, Memory, Attention, Awareness  TR      Anticipated Outcomes Item Anticipated Outcome  Self Feeding (S)  Swallowing  Supervision   Basic self-care  (S)  Toileting  (S)   Bathroom Transfers (S)  Bowel/Bladder  manage B+B with min assist  Transfers  S  Locomotion  S with LRAD  Communication     Cognition  Mod A   Pain  Pain managed at or below level 5  Safety/Judgment  maintain with cues/reminders   Therapy Plan: PT Intensity: Minimum of 1-2 x/day ,45 to 90 minutes PT Frequency: 5 out of 7 days PT Duration Estimated Length of Stay: 12-14 days OT Intensity: Minimum of 1-2 x/day, 45 to 90 minutes OT Frequency: 5 out of 7 days OT Duration/Estimated Length of Stay: 12-14 days SLP Intensity: Minumum of 1-2 x/day, 30 to 90 minutes SLP Frequency: 3 to 5 out of 7 days SLP Duration/Estimated Length of Stay: 12-14 days     Team Interventions: Nursing Interventions  Patient/Family Education, Skin Care/Wound Management, Discharge Planning, Pain Management, Bladder Management, Medication Management, Dysphagia/Aspiration Precaution Training  PT interventions Ambulation/gait training, Discharge planning, Functional mobility training, Psychosocial support, Therapeutic Activities, Therapeutic Exercise, Skin care/wound management, Neuromuscular re-education, Disease management/prevention, Balance/vestibular training, Cognitive remediation/compensation, DME/adaptive equipment instruction, Pain management, Splinting/orthotics, UE/LE Strength taining/ROM, Stair training, UE/LE Coordination activities, Patient/family education, Functional electrical stimulation, Community reintegration  OT Interventions Warden/ranger, Cognitive remediation/compensation, Firefighter, Discharge planning, Disease mangement/prevention, Fish farm manager, Functional electrical stimulation, Functional mobility training, Neuromuscular re-education, Pain management, Psychosocial support, Self Care/advanced ADL retraining, UE/LE Coordination activities, UE/LE Strength taining/ROM, Patient/family education, Therapeutic Activities, Therapeutic Exercise, Wheelchair propulsion/positioning  SLP Interventions Cognitive remediation/compensation, Environmental controls, Internal/external aids, Speech/Language facilitation, Therapeutic Activities, Patient/family education, Functional tasks, Dysphagia/aspiration precaution training, Cueing hierarchy  TR Interventions    SW/CM Interventions Discharge Planning, Psychosocial Support, Patient/Family Education   Barriers to Discharge MD  Medical stability  Nursing      PT Decreased caregiver support, Lack of/limited family support, Home environment access/layout unsure of support family can/will provide, steps to enter home with no rail  OT      SLP      SW       Team Discharge Planning: Destination: PT-Home ,OT-  Home , SLP-Home Projected Follow-up: PT-Home health PT, 24 hour supervision/assistance, OT-  Home health OT, SLP-Home Health SLP, 24 hour supervision/assistance Projected Equipment Needs: PT-To be determined, OT- To be determined, SLP-None recommended by SLP Equipment Details: PT-has RW, OT-  Patient/family involved in discharge planning: PT- Patient,  OT-Patient, SLP-Patient  MD ELOS: 12-14 days Medical Rehab Prognosis:  Excellent Assessment: The patient has been admitted for CIR therapies with the diagnosis of bilateral watershed cerebral infarcts with recent cervical decompression and fusion. The team will be addressing functional mobility, strength, stamina, balance, safety, adaptive techniques and equipment, self-care, bowel and bladder mgt, patient and caregiver education, NMR, cognition, communication, pain mgt. Goals have been set at supervision with mobility, self-care, ADL's, and mod assist with cognition. Ranelle Oyster, MD, FAAPMR      See Team Conference Notes for weekly updates to the plan of care n

## 2018-06-20 NOTE — Progress Notes (Signed)
Physical Therapy Session Note  Patient Details  Name: Ian Ramos MRN: [id number] Date of Birth: [birth year]  Today's Date: 06/20/2018 PT Individual Time: 1030-1058 PT Individual Time Calculation (min): 28 min   Short Term Goals: Week 1:  PT Short Term Goal 1 (Week 1): =LTG due to estimated LOS  Skilled Therapeutic Interventions/Progress Updates:  Pt awake but only answers in 1 word phrases.  Pt requires mod A and 3 attempts to perform sit <> stand from w/c and stand pivot transfers during session.  Pt performs seated therex for LE strengthening with mod cues for attention to task in distracting environment. Pt left in w/c with alarm set, needs at hand.  Therapy Documentation Precautions:  Restrictions Weight Bearing Restrictions: No Pain: Pain Assessment Pain Scale: 0-10 Pain Score: 0-No pain    See Function Navigator for Current Functional Status.   Therapy/Group: Individual Therapy  Deloss Amico 06/20/2018, 11:00 AM

## 2018-06-20 NOTE — Discharge Instructions (Signed)
Inpatient Rehab Discharge Instructions  [facility] [employer] Discharge date and time: No discharge date for patient encounter.   Activities/Precautions/ Functional Status: Activity: activity as tolerated Diet:  Wound Care: none needed Functional status:  ___ No restrictions     ___ Walk up steps independently ___ 24/7 supervision/assistance   ___ Walk up steps with assistance ___ Intermittent supervision/assistance  ___ Bathe/dress independently ___ Walk with walker     _x STROKE/TIA DISCHARGE INSTRUCTIONS SMOKING Cigarette smoking nearly doubles your risk of having a stroke & is the single most alterable risk factor  If you smoke or have smoked in the last 12 months, you are advised to quit smoking for your health.  Most of the excess cardiovascular risk related to smoking disappears within a year of stopping.  Ask you doctor about anti-smoking medications  [place] Quit Line: 1-800-QUIT NOW  Free Smoking Cessation Classes [phone]  CHOLESTEROL Know your levels; limit fat & cholesterol in your diet  Lipid Panel     Component Value Date/Time   CHOL 155 06/07/2018 0226   TRIG 154 (H) 06/07/2018 0226   HDL 31 (L) 06/07/2018 0226   CHOLHDL 5.0 06/07/2018 0226   VLDL 31 06/07/2018 0226   LDLCALC 93 06/07/2018 0226      Many patients benefit from treatment even if their cholesterol is at goal.  Goal: Total Cholesterol (CHOL) less than 160  Goal:  Triglycerides (TRIG) less than 150  Goal:  HDL greater than 40  Goal:  LDL (LDLCALC) less than 100   BLOOD PRESSURE American Stroke Association blood pressure target is less that 120/80 mm/Hg  Your discharge blood pressure is:  BP: (!) 142/74  Monitor your blood pressure  Limit your salt and alcohol intake  Many individuals will require more than one medication for high blood pressure  DIABETES (A1c is a blood sugar average for last 3 months) Goal HGBA1c is under 7% (HBGA1c is blood sugar average for last 3 months)   Diabetes: No known diagnosis of diabetes    Lab Results  Component Value Date   HGBA1C 6.1 (H) 06/06/2018     Your HGBA1c can be lowered with medications, healthy diet, and exercise.  Check your blood sugar as directed by your physician  Call your physician if you experience unexplained or low blood sugars.  PHYSICAL ACTIVITY/REHABILITATION Goal is 30 minutes at least 4 days per week  Activity: Increase activity slowly, Therapies: Physical Therapy: Home Health Return to work:   Activity decreases your risk of heart attack and stroke and makes your heart stronger.  It helps control your weight and blood pressure; helps you relax and can improve your mood.  Participate in a regular exercise program.  Talk with your doctor about the best form of exercise for you (dancing, walking, swimming, cycling).  DIET/WEIGHT Goal is to maintain a healthy weight  Your discharge diet is:  Diet Order            DIET DYS 2 Room service appropriate? Yes; Fluid consistency: Nectar Thick  Diet effective now              liquids Your height is:  Height: 5' 7.5" (171.5 cm) Your current weight is: Weight: 75.2 kg Your Body Mass Index (BMI) is:  BMI (Calculated): 25.57  Following the type of diet specifically designed for you will help prevent another stroke.  Your goal weight range is:    Your goal Body Mass Index (BMI) is 19-24.  Healthy food habits  can help reduce 3 risk factors for stroke:  High cholesterol, hypertension, and excess weight.  RESOURCES Stroke/Support Group:  Call [phone]   STROKE EDUCATION PROVIDED/REVIEWED AND GIVEN TO PATIENT Stroke warning signs and symptoms How to activate emergency medical system (call 911). Medications prescribed at discharge. Need for follow-up after discharge. Personal risk factors for stroke. Pneumonia vaccine given:  Flu vaccine given:  My questions have been answered, the writing is legible, and I understand these instructions.  I will  adhere to these goals & educational materials that have been provided to me after my discharge from the hospital.   __ Bathe/dress with assistance ___ Walk Independently    ___ Shower independently ___ Walk with assistance    ___ Shower with assistance ___ No alcohol     ___ Return to work/school ________  Special Instructions:    My questions have been answered and I understand these instructions. I will adhere to these goals and the provided educational materials after my discharge from the hospital.  Patient/Caregiver Signature _______________________________ Date __________  Clinician Signature _______________________________________ Date __________  Please bring this form and your medication list with you to all your follow-up doctor's appointments.

## 2018-06-20 NOTE — Care Management (Signed)
Inpatient Rehabilitation Center Individual Statement of Services  Patient Name:  Ian Ramos  Date:  06/20/2018  Welcome to the Inpatient Rehabilitation Center.  Our goal is to provide you with an individualized program based on your diagnosis and situation, designed to meet your specific needs.  With this comprehensive rehabilitation program, you will be expected to participate in at least 3 hours of rehabilitation therapies Monday-Friday, with modified therapy programming on the weekends.  Your rehabilitation program will include the following services:  Physical Therapy (PT), Occupational Therapy (OT), Speech Therapy (ST), 24 hour per day rehabilitation nursing, Therapeutic Recreaction (TR), Neuropsychology, Case Management (Social Worker), Rehabilitation Medicine, Nutrition Services and Pharmacy Services  Weekly team conferences will be held on Tuesdays to discuss your progress.  Your Social Worker will talk with you frequently to get your input and to update you on team discussions.  Team conferences with you and your family in attendance may also be held.  Expected length of stay: 7-10 days   Overall anticipated outcome: supervision  Depending on your progress and recovery, your program may change. Your Social Worker will coordinate services and will keep you informed of any changes. Your Social Worker's name and contact numbers are listed  below.  The following services may also be recommended but are not provided by the Inpatient Rehabilitation Center:   Driving Evaluations  Home Health Rehabiltiation Services  Outpatient Rehabilitation Services   Arrangements will be made to provide these services after discharge if needed.  Arrangements include referral to agencies that provide these services.  Your insurance has been verified to be:  BCBS and Medicare Your primary doctor is:  Doreene Burke  Pertinent information will be shared with your doctor and your insurance  company.  Social Worker:  [place], [place] [phone] or [phone]   Information discussed with and copy given to patient by: Amada Jupiter, 06/20/2018, 4:22 PM

## 2018-06-20 NOTE — Evaluation (Signed)
Physical Therapy Assessment and Plan  Patient Details  Name: Ian Ramos MRN: [id number] Date of Birth: 12/10/1945  PT Diagnosis: Abnormal posture, Cognitive deficits, Difficulty walking, Hemiparesis dominant, Impaired cognition, Muscle weakness and Pain in neck Rehab Potential: Good ELOS: 12-14 days   Today's Date: 06/20/2018 PT Individual Time: 0800-0900 PT Individual Time Calculation (min): 60 min    Problem List:  Patient Active Problem List   Diagnosis Date Noted  . Right middle cerebral artery stroke ([place]) 06/19/2018  . Spondylosis, cervical, with myelopathy   . Neurologic gait disorder   . Essential hypertension   . Acute encephalopathy   . Acute respiratory failure ([place])   . Acute bilat watershed infarction [facility])   . On mechanically assisted ventilation ([place])   . Acute systolic CHF (congestive heart failure) ([place])   . Coronary artery disease due to lipid rich plaque   . DCM (dilated cardiomyopathy) ([place])   . Intractable pain 05/28/2018  . Chest pain 05/28/2018  . CKD (chronic kidney disease) stage 3, GFR 30-59 ml/min (HCC) 05/28/2018  . Esophageal thickening 05/28/2018  . LBBB (left bundle branch block) 05/28/2018  . Postoperative anemia 05/28/2018  . Status post cervical spinal fusion 05/23/2018  . Cervical myelopathy ([place]) 05/23/2018  . Left arm weakness 09/20/2017  . Brachial plexopathy 09/16/2017  . Need for pneumococcal vaccination 09/16/2017  . Need for influenza vaccination 09/16/2017    Past Medical History:  Past Medical History:  Diagnosis Date  . Arthritis   . Neuromuscular disorder [facility])    Past Surgical History:  Past Surgical History:  Procedure Laterality Date  . LEFT HEART CATH AND CORONARY ANGIOGRAPHY N/A 06/05/2018   Procedure: LEFT HEART CATH AND CORONARY ANGIOGRAPHY;  Surgeon: Martinique, Peter M, MD;  Location: [facility];  Service: Cardiovascular;  Laterality: N/A;  . POSTERIOR CERVICAL FUSION/FORAMINOTOMY N/A 05/23/2018    Procedure: Posterior Cervical Fusion with lateral mass fixation - C1 - C6 with laminectomy;  Surgeon: Earnie Larsson, MD;  Location: [facility];  Service: Neurosurgery;  Laterality: N/A;    Assessment & Plan Clinical Impression: Ian Ramos is a 72 year old right-handed male with history of CKD stage III and cervical stenosis with myelopathy who underwent cervical decompression and fusion 05/23/2018 by Dr. Annette Stable and discharged home 05/25/2018 ambulating at a supervision level 300 feet with rolling walker. Per chart review patient lives with sister.Supportive care of significant other.Had been using a rolling walker since recent cervical surgery. One level home with 2 steps to entry. He was readmitted 05/28/2018 with chest pain and left shoulder pain. Troponin negative, mildly elevated creatinine 1.39. Chest x-ray no acute disease. Neurosurgery follow-up felt that chest and left shoulder pain related to persistent C5 radiculopathy. CT cervical spine showed no fracture deformity or malalignment. Postoperative fluid collection and gas consistent with recent surgery. CT angiogram of chest showing some potential esophagitis. Esophagram was negative after GI follow-up and signed off. Echocardiogram with ejection fraction of 40% diffuse hypokinesis grade 1 diastolic dysfunction. EKG showed left bundle branch block no ischemic changes. Cardiology service follow-up for question EKG changes with cardiac Myoview study completed completed 06/04/2018 with findings consistent with ischemia ejection fraction 39%. Underwent cardiac catheterization 06/05/2018 showing occluded acute marginal, 75% proximal LAD, 75% OM 2 and 30% proximal to mid RCA. Medical management was recommended and was cleared to begin aspirin and Plavix. Post cardiac catheterization with lethargy and altered mental status changes. Rapid response was called. CT/MRI showed subcentimeter acute early subacute infarctions, 2 in the left  superior  frontal lobe and 2 in the right posterior lateral parietal lobe. No associated mass-effect. MRA with minimal flow related signal in the left cervical ICA and absent flow related signal within the petrous and cavernous segments of the left ICA. Neurology service is consulted for follow-up. Carotid Doppler showed right ICA 80 to 90% stenosis left ICA occluded. On 06/09/2018 patient found unresponsive in his chair blood pressures systolic in the 95M heart rate in the 50s. A code was called patient received atropine was intubated for respiratory support. Patient's creatinine did bump up to 1.98 BUN 36 placed on gentle IV fluids. Vascular surgery/Dr. Oneida Alar consulted for severe ICA disease tentatively planning for transfemoral carotid stent and await plan for procedure. Presently on a dysphagia #2 honey thick liquid diet. Therapy evaluations have been completed and ongoing with recommendations of physical medicine rehab consult. Patient was admitted for a comprehensive rehab program. Patient transferred to CIR on 06/19/2018 .   Patient currently requires mod with mobility secondary to muscle weakness, decreased cardiorespiratoy endurance, impaired timing and sequencing, unbalanced muscle activation and decreased coordination, decreased initiation, decreased attention, decreased awareness, decreased problem solving, decreased safety awareness, decreased memory and delayed processing and decreased sitting balance, decreased standing balance, decreased postural control, hemiplegia and decreased balance strategies.  Prior to hospitalization, patient was modified independent  with mobility and lived with Alone(info from chart review, pt is poor historian) in a House home.  Home access is 2Stairs to enter.  Patient will benefit from skilled PT intervention to maximize safe functional mobility, minimize fall risk and decrease caregiver burden for planned discharge home with 24 hour supervision.  Anticipate patient  will benefit from follow up [place] at discharge.  PT - End of Session Activity Tolerance: Tolerates 30+ min activity with multiple rests Endurance Deficit: Yes Endurance Deficit Description: fatigues quickly with mobility activities PT Assessment Rehab Potential (ACUTE/IP ONLY): Good PT Barriers to Discharge: Decreased caregiver support;Lack of/limited family support;Home environment access/layout PT Barriers to Discharge Comments: unsure of support family can/will provide, steps to enter home with no rail PT Patient demonstrates impairments in the following area(s): Warehouse manager;Endurance;Motor;Safety;Pain PT Transfers Functional Problem(s): Bed Mobility;Bed to Chair;Car;Furniture PT Locomotion Functional Problem(s): Ambulation;Stairs PT Plan PT Intensity: Minimum of 1-2 x/day ,45 to 90 minutes PT Frequency: 5 out of 7 days PT Duration Estimated Length of Stay: 7-10 days PT Treatment/Interventions: Ambulation/gait training;Discharge planning;Functional mobility training;Psychosocial support;Therapeutic Activities;Therapeutic Exercise;Skin care/wound management;Neuromuscular re-education;Disease management/prevention;Balance/vestibular training;Cognitive remediation/compensation;DME/adaptive equipment instruction;Pain management;Splinting/orthotics;UE/LE Strength taining/ROM;Stair training;UE/LE Coordination activities;Patient/family education;Functional electrical stimulation;Community reintegration PT Transfers Anticipated Outcome(s): S  PT Locomotion Anticipated Outcome(s): S with LRAD PT Recommendation Follow Up Recommendations: Home health PT;24 hour supervision/assistance Patient destination: Home Equipment Recommended: To be determined Equipment Details: has RW  Skilled Therapeutic Intervention Pt received in bed, denies pain and agreeable to treatment.  Assessed mobility as below with minA overall; slow initiation. Educated pt on rehab process, goals, estimated LOS to be  determined following all evaluations. Pt remained seated in w/c, chair alarm and lap belt intact, all needs in reach.   PT Evaluation Precautions/Restrictions Precautions Precautions: Fall;Cervical Precaution Booklet Issued: No Precaution Comments: reviewed cervical precautions Required Braces or Orthoses: Cervical Brace Cervical Brace: Soft collar;At all times Restrictions Weight Bearing Restrictions: No General Chart Reviewed: Yes Response to Previous Treatment: Not applicable Family/Caregiver Present: No   Vital SignsTherapy Vitals Pulse Rate: 64 BP: 110/63 Pain Pain Assessment Pain Scale: 0-10 Pain Score: 0-No pain Home Living/Prior Functioning Home Living Available Help at Discharge: Family;Available PRN/intermittently  Type of Home: House Home Access: Stairs to enter [employer] of Steps: 2 Entrance Stairs-Rails: None  Lives With: Alone(info from chart review, pt is poor historian) Prior Function Comments: Has been using RW since d/c from cervical surgery. Reports enjoying riding his tractor Vision/Perception  Perception Perception: Within Functional Limits Praxis Praxis: Intact  Cognition Overall Cognitive Status: Impaired/Different from baseline Arousal/Alertness: Awake/alert Orientation Level: Oriented to person;Disoriented to place;Disoriented to time;Disoriented to situation Safety/Judgment: Impaired Sensation Sensation Light Touch: Not tested(unable to assess formally) Coordination Gross Motor Movements are Fluid and Coordinated: No Fine Motor Movements are Fluid and Coordinated: No Motor  Motor Motor: Other (comment) Motor - Skilled Clinical Observations: generalized weakness  Mobility Bed Mobility Bed Mobility: Supine to Sit Supine to Sit: Moderate Assistance - Patient 50-74%(HOB flat, no bedrails) Transfers Transfers: Sit to Stand;Stand Pivot Transfers Sit to Stand: Minimal Assistance - Patient > 75% Stand Pivot Transfers: Minimal  Assistance - Patient > 75% Stand Pivot Transfer Details: Verbal cues for technique;Verbal cues for precautions/safety;Verbal cues for sequencing;Verbal cues for safe use of DME/AE;Tactile cues for sequencing Stand Pivot Transfer Details (indicate cue type and reason): cues for hand placement on w/c arm rest sit <>stand however pt unwilling to attempt Transfer (Assistive device): Rolling walker Locomotion  Gait Ambulation: Yes Gait Assistance: Minimal Assistance - Patient > 75% Gait Distance (Feet): 25 Feet Assistive device: Rolling walker Gait Assistance Details: Verbal cues for technique;Verbal cues for precautions/safety;Tactile cues for posture Gait Gait: Yes Gait Pattern: Impaired Gait Pattern: Step-through pattern;Decreased stride length;Poor foot clearance - left;Poor foot clearance - right;Trunk flexed;Decreased trunk rotation;Right flexed knee in stance;Left flexed knee in stance;Right foot flat;Left foot flat Gait velocity: significantly reduced compared to norms Stairs / Additional Locomotion Stairs: Yes Stairs Assistance: Minimal Assistance - Patient > 75% Stair Management Technique: Step to pattern;Forwards Number of Stairs: 8 Height of Stairs: 3 Wheelchair Mobility Wheelchair Mobility: No  Trunk/Postural Assessment  Cervical Assessment Cervical Assessment: Exceptions to WFL(soft collar d/t recent cx surgery) Thoracic Assessment Thoracic Assessment: Exceptions to WFL(increased kyphosis) Lumbar Assessment Lumbar Assessment: Exceptions to WFL(reduced lumbar lordosis, posterior pelvic tilt) Postural Control Postural Control: Deficits on evaluation(delayed righting/stepping reactions)  Balance Balance Balance Assessed: Yes Standardized Balance Assessment Standardized Balance Assessment: Timed Up and Go Test Timed Up and Go Test TUG: Normal TUG Normal TUG (seconds): 78(with RW, min guard) Static Sitting Balance Static Sitting - Balance Support: No upper extremity  supported;Feet supported Static Sitting - Level of Assistance: 6: Modified independent (Device/Increase time) Dynamic Sitting Balance Dynamic Sitting - Balance Support: Feet supported;During functional activity;No upper extremity supported Dynamic Sitting - Level of Assistance: 5: Stand by assistance Static Standing Balance Static Standing - Balance Support: Bilateral upper extremity supported;During functional activity Static Standing - Level of Assistance: 5: Stand by assistance(no UE support x20 sec min guard) Dynamic Standing Balance Dynamic Standing - Balance Support: Bilateral upper extremity supported;During functional activity Extremity Assessment  RUE Assessment RUE Assessment: Exceptions to [facility] General Strength Comments: Not tested d/t limited AROM RUE AROM (degrees) Right Shoulder Flexion: 105 Degrees Right Shoulder ABduction: 90 Degrees Right Elbow Flexion: 150 Right Elbow Extension: 0 LUE Assessment LUE Assessment: Exceptions to WFL LUE AROM (degrees) Left Shoulder Flexion: 45 Degrees Left Shoulder ABduction: 25 Degrees Left Elbow Flexion: 150 Left Elbow Extension: 0 RLE Assessment RLE Assessment: Exceptions to [facility] RLE Strength Right Hip Flexion: 4-/5 Right Hip ABduction: 5/5 Right Hip ADduction: 5/5 Right Knee Flexion: 5/5 Right Knee Extension: 5/5 Right Ankle Dorsiflexion: 3/5 Right Ankle Plantar Flexion:  4+/5 LLE Assessment LLE Assessment: Exceptions to [facility] LLE Strength Left Hip Flexion: 4-/5 Left Hip ABduction: 5/5 Left Hip ADduction: 5/5 Left Knee Flexion: 5/5 Left Knee Extension: 5/5 Left Ankle Dorsiflexion: 4+/5 Left Ankle Plantar Flexion: 5/5   See Function Navigator for Current Functional Status.   Refer to Care Plan for Long Term Goals  Recommendations for other services: Neuropsych  Discharge Criteria: Patient will be discharged from PT if patient refuses treatment 3 consecutive times without medical reason, if treatment goals not met, if  there is a change in medical status, if patient makes no progress towards goals or if patient is discharged from hospital.  The above assessment, treatment plan, treatment alternatives and goals were discussed and mutually agreed upon: by patient  Corliss Skains 06/20/2018, 9:52 AM

## 2018-06-20 NOTE — Evaluation (Signed)
Speech Language Pathology Assessment and Plan  Patient Details  Name: Ian Ramos MRN: [id number] Date of Birth: 1946/02/14  SLP Diagnosis: Cognitive Impairments;Dysphagia  Rehab Potential: Good ELOS: 12-14 days     Today's Date: 06/20/2018 SLP Individual Time: 1497-0263 SLP Individual Time Calculation (min): 55 min   Problem List:  Patient Active Problem List   Diagnosis Date Noted  . Right middle cerebral artery stroke ([place]) 06/19/2018  . Spondylosis, cervical, with myelopathy   . Neurologic gait disorder   . Essential hypertension   . Acute encephalopathy   . Acute respiratory failure ([place])   . Acute bilat watershed infarction [facility])   . On mechanically assisted ventilation ([place])   . Acute systolic CHF (congestive heart failure) ([place])   . Coronary artery disease due to lipid rich plaque   . DCM (dilated cardiomyopathy) ([place])   . Intractable pain 05/28/2018  . Chest pain 05/28/2018  . CKD (chronic kidney disease) stage 3, GFR 30-59 ml/min (HCC) 05/28/2018  . Esophageal thickening 05/28/2018  . LBBB (left bundle branch block) 05/28/2018  . Postoperative anemia 05/28/2018  . Status post cervical spinal fusion 05/23/2018  . Cervical myelopathy ([place]) 05/23/2018  . Left arm weakness 09/20/2017  . Brachial plexopathy 09/16/2017  . Need for pneumococcal vaccination 09/16/2017  . Need for influenza vaccination 09/16/2017   Past Medical History:  Past Medical History:  Diagnosis Date  . Arthritis   . Neuromuscular disorder [facility])    Past Surgical History:  Past Surgical History:  Procedure Laterality Date  . LEFT HEART CATH AND CORONARY ANGIOGRAPHY N/A 06/05/2018   Procedure: LEFT HEART CATH AND CORONARY ANGIOGRAPHY;  Surgeon: Martinique, Peter M, MD;  Location: [facility];  Service: Cardiovascular;  Laterality: N/A;  . POSTERIOR CERVICAL FUSION/FORAMINOTOMY N/A 05/23/2018   Procedure: Posterior Cervical Fusion with lateral mass fixation - C1 - C6 with laminectomy;   Surgeon: Earnie Larsson, MD;  Location: [facility];  Service: Neurosurgery;  Laterality: N/A;    Assessment / Plan / Recommendation Clinical Impression Patient is a 72 year old right-handed male with history of CKD stage III and cervical stenosis with myelopathy who underwent cervical decompression and fusion 05/23/2018 by Dr. Annette Stable and discharged home 05/25/2018 ambulating at a supervision level 300 feet with rolling walker.Supportive care of significant other.Had been using a rolling walker since recent cervical surgery. One level home with 2 steps to entry. He was readmitted 05/28/2018 with chest pain and left shoulder pain. Troponin negative, mildly elevated creatinine 1.39. Chest x-ray no acute disease. Neurosurgery follow-up felt that chest and left shoulder pain related to persistent C5 radiculopathy. CT cervical spine showed no fracture deformity or malalignment. Postoperative fluid collection and gas consistent with recent surgery. CT angiogram of chest showing some potential esophagitis. Esophagram was negative after GI follow-up and signed off. Echocardiogram with ejection fraction of 40% diffuse hypokinesis grade 1 diastolic dysfunction. EKG showed left bundle branch block no ischemic changes. Cardiology service follow-up for question EKG changes with cardiac Myoview study completed completed 06/04/2018 with findings consistent with ischemia ejection fraction 39%. Underwent cardiac catheterization 06/05/2018 showing occluded acute marginal, 75% proximal LAD, 75% OM 2 and 30% proximal to mid RCA. Medical management was recommended and was cleared to begin aspirin and Plavix. Post cardiac catheterization with lethargy and altered mental status changes. Rapid response was called. CT/MRI showed subcentimeter acute early subacute infarctions, 2 in the left superior frontal lobe and 2 in the right posterior lateral parietal lobe. No associated mass-effect. MRA with minimal flow  related signal in the  left cervical ICA and absent flow related signal within the petrous and cavernous segments of the left ICA. Neurology service is consulted for follow-up. Carotid Doppler showed right ICA 80 to 90% stenosis left ICA occluded. On 06/09/2018 patient found unresponsive in his chair blood pressures systolic in the 70W heart rate in the 50s. A code was called patient received atropine was intubated for respiratory support. Patient's creatinine did bump up to 1.98 BUN 36 placed on gentle IV fluids. Vascular surgery/Dr. Oneida Alar consulted for severe ICA disease tentatively planning for transfemoral carotid stent and await plan for procedure. Presently on a dysphagia #2 nectar thick liquid diet. Therapy evaluations have been completed and ongoing with recommendations of physical medicine rehab consult. Patientis to beadmitted for a comprehensive rehab program on 06/19/18.  Patient demonstrates severe cognitive impairments impacting initiation, attention, awareness, problem solving, and recall which impacts his ability to complete functional and familiar tasks safely. Patient also demonstrates a flat affect with language of confusion and confabulation. Intermittent phonemic paraphasias noted but language difficult to formally assess due to severe cognitive impairments. Patient consumed trials of thin liquids via spoon and straw with what appeared to be a timely swallow initiation and demonstrated subtle, delayed throat clearing. No overt s/s of aspiration noted with nectar-thick liquids via straw. Mild left lingual residue also noted with solid textures. Recommend patient continue current diet of Dys. 2 textures with nectar-thick liquids with continued trials with SLP. Patient would benefit from skilled SLP intervention to maximize his cognitive and swallowing function in order to maximize his overall functional independence prior to discharge.    Skilled Therapeutic Interventions          Administered a  cognitive-linguistic evaluation and BSE, please see above for details. Educated patient on current SLP goals, he verbalized understanding and agreement.   SLP Assessment  Patient will need skilled Speech Lanaguage Pathology Services during CIR admission    Recommendations  SLP Diet Recommendations: Dysphagia 2 (Fine chop);Nectar Medication Administration: Crushed with puree Supervision: Staff to assist with self feeding;Full supervision/cueing for compensatory strategies Compensations: Minimize environmental distractions;Slow rate;Small sips/bites;Lingual sweep for clearance of pocketing Postural Changes and/or Swallow Maneuvers: Seated upright 90 degrees Oral Care Recommendations: Oral care BID Patient destination: Home Follow up Recommendations: Home Health SLP;24 hour supervision/assistance Equipment Recommended: None recommended by SLP    SLP Frequency 3 to 5 out of 7 days   SLP Duration  SLP Intensity  SLP Treatment/Interventions 12-14 days   Minumum of 1-2 x/day, 30 to 90 minutes  Cognitive remediation/compensation;Environmental controls;Internal/external aids;Speech/Language facilitation;Therapeutic Activities;Patient/family education;Functional tasks;Dysphagia/aspiration precaution training;Cueing hierarchy    Pain Pain Assessment Pain Scale: 0-10 Pain Score: 0-No pain  Prior Functioning Type of Home: House  Lives With: Alone(all information was obtained from chart) Available Help at Discharge: Family;Available PRN/intermittently  Function:  Eating Eating   Modified Consistency Diet: Yes Eating Assist Level: Helper feeds patient;More than reasonable amount of time;Supervision or verbal cues   Eating Set Up Assist For: Opening containers       Cognition Comprehension Comprehension assist level: Understands basic 75 - 89% of the time/ requires cueing 10 - 24% of the time  Expression   Expression assist level: Expresses basic 50 - 74% of the time/requires  cueing 25 - 49% of the time. Needs to repeat parts of sentences.  Social Interaction Social Interaction assist level: Interacts appropriately 25 - 49% of time - Needs frequent redirection.  Problem Solving Problem solving assist level: Solves basic  25 - 49% of the time - needs direction more than half the time to initiate, plan or complete simple activities  Memory Memory assist level: Recognizes or recalls less than 25% of the time/requires cueing greater than 75% of the time   Short Term Goals: Week 1: SLP Short Term Goal 1 (Week 1): Patient will consume single, small sips of thin liquids via straw with minimal overt s/s of aspiration with Min A verbal cues over 2 sessions prior to upgrade.  SLP Short Term Goal 2 (Week 1): Patient will consume trials of Dys. 3 texutres with efficient mastication and complete oral clearance without overt s/s of aspiration over 2 sessions with Min A verbal cues prior to upgrade.  SLP Short Term Goal 3 (Week 1): Patient will demonstrate sustained attention to functional tasks for 5 minutes with Mod A verbal cues for redirection.  SLP Short Term Goal 4 (Week 1): Patient will demonstrate functional problem solving for basic and familiar tasks with Mod A verbal cues.  SLP Short Term Goal 5 (Week 1): Patient will utilize external aids for orientation to place, time and situation with Max A multimodal cues.   Refer to Care Plan for Long Term Goals  Recommendations for other services: Neuropsych  Discharge Criteria: Patient will be discharged from SLP if patient refuses treatment 3 consecutive times without medical reason, if treatment goals not met, if there is a change in medical status, if patient makes no progress towards goals or if patient is discharged from hospital.  The above assessment, treatment plan, treatment alternatives and goals were discussed and mutually agreed upon: by patient  Amera Banos 06/20/2018, 3:24 PM

## 2018-06-20 NOTE — Evaluation (Signed)
Occupational Therapy Assessment and Plan  Patient Details  Name: Ian Ramos MRN: [id number] Date of Birth: 07/06/1946  OT Diagnosis: cognitive deficits and muscle weakness (generalized) Rehab Potential: Rehab Potential (ACUTE ONLY): Good ELOS: 12-14 days   Today's Date: 06/20/2018 OT Individual Time: 1300-1400 OT Individual Time Calculation (min): 60 min     Problem List:  Patient Active Problem List   Diagnosis Date Noted  . Right middle cerebral artery stroke (HCC) 06/19/2018  . Spondylosis, cervical, with myelopathy   . Neurologic gait disorder   . Essential hypertension   . Acute encephalopathy   . Acute respiratory failure (HCC)   . Acute bilat watershed infarction (HCC)   . On mechanically assisted ventilation (HCC)   . Acute systolic CHF (congestive heart failure) (HCC)   . Coronary artery disease due to lipid rich plaque   . DCM (dilated cardiomyopathy) (HCC)   . Intractable pain 05/28/2018  . Chest pain 05/28/2018  . CKD (chronic kidney disease) stage 3, GFR 30-59 ml/min (HCC) 05/28/2018  . Esophageal thickening 05/28/2018  . LBBB (left bundle branch block) 05/28/2018  . Postoperative anemia 05/28/2018  . Status post cervical spinal fusion 05/23/2018  . Cervical myelopathy (HCC) 05/23/2018  . Left arm weakness 09/20/2017  . Brachial plexopathy 09/16/2017  . Need for pneumococcal vaccination 09/16/2017  . Need for influenza vaccination 09/16/2017    Past Medical History:  Past Medical History:  Diagnosis Date  . Arthritis   . Neuromuscular disorder (HCC)    Past Surgical History:  Past Surgical History:  Procedure Laterality Date  . LEFT HEART CATH AND CORONARY ANGIOGRAPHY N/A 06/05/2018   Procedure: LEFT HEART CATH AND CORONARY ANGIOGRAPHY;  Surgeon: Jordan, Peter M, MD;  Location: [facility];  Service: Cardiovascular;  Laterality: N/A;  . POSTERIOR CERVICAL FUSION/FORAMINOTOMY N/A 05/23/2018   Procedure: Posterior Cervical Fusion with lateral  mass fixation - C1 - C6 with laminectomy;  Surgeon: Pool, Henry, MD;  Location: [facility] OR;  Service: Neurosurgery;  Laterality: N/A;    Assessment & Plan Clinical Impression: Ian Ramos is a 71-year-old right-handed male with history of CKD stage III and cervical stenosis with myelopathy who underwent cervical decompression and fusion 05/23/2018 by Dr. Pool and discharged home 05/25/2018 ambulating at a supervision level 300 feet with rolling walker. Per chart review patient lives with sister.Supportive care of significant other.Had been using a rolling walker since recent cervical surgery. He was readmitted 05/28/2018 with chest pain and left shoulder pain. Echocardiogram with ejection fraction of 40% diffuse hypokinesis grade 1 diastolic dysfunction. EKG showed left bundle branch block no ischemic changes. Cardiology service follow-up for question EKG changes with cardiac Myoview study completed completed 06/04/2018 with findings consistent with ischemia ejection fraction 39%. Underwent cardiac catheterization 06/05/2018 showing occluded acute marginal, 75% proximal LAD, 75% OM 2 and 30% proximal to mid RCA. Medical management was recommended and was cleared to begin aspirin and Plavix. Post cardiac catheterization with lethargy and altered mental status changes. Rapid response was called. CT/MRI showed subcentimeter acute early subacute infarctions, 2 in the left superior frontal lobe and 2 in the right posterior lateral parietal lobe. No associated mass-effect. MRA with minimal flow related signal in the left cervical ICA and absent flow related signal within the petrous and cavernous segments of the left ICA. Neurology service is consulted for follow-up. Carotid Doppler showed right ICA 80 to 90% stenosis left ICA occluded. On 06/09/2018 patient found unresponsive in his chair blood pressures systolic in the 40s   heart rate in the 50s. A code was called patient received atropine was intubated  for respiratory support. Patient's creatinine did bump up to 1.98 BUN 36 placed on gentle IV fluids. Vascular surgery/Dr. Oneida Alar consulted for severe ICA disease tentatively planning for transfemoral carotid stent and await plan for procedure. Presently on a dysphagia #2 honey thick liquid diet. Therapy evaluations have been completed and ongoing with recommendations of physical medicine rehab consult. Patient was admitted for a comprehensive rehab program.  Patient transferred to CIR on 06/19/2018 .    Patient currently requires min with basic self-care skills secondary to muscle weakness, decreased cardiorespiratoy endurance, decreased initiation, decreased attention, decreased awareness, decreased problem solving, decreased safety awareness, decreased memory and delayed processing and decreased standing balance, decreased postural control and decreased balance strategies.  Prior to hospitalization, patient could complete ADLs with modified independent .  Patient will benefit from skilled intervention to decrease level of assist with basic self-care skills prior to discharge home with care partner.  Anticipate patient will require 24 hour supervision and follow up home health.  OT - End of Session Activity Tolerance: Tolerates 10 - 20 min activity with multiple rests Endurance Deficit: Yes Endurance Deficit Description: generalized weakness OT Assessment Rehab Potential (ACUTE ONLY): Good OT Patient demonstrates impairments in the following area(s): Balance;Endurance;Behavior;Cognition;Motor;Safety OT Basic ADL's Functional Problem(s): Eating;Grooming;Dressing;Toileting;Bathing OT Transfers Functional Problem(s): Toilet;Tub/Shower OT Additional Impairment(s): Fuctional Use of Upper Extremity OT Plan OT Intensity: Minimum of 1-2 x/day, 45 to 90 minutes OT Frequency: 5 out of 7 days OT Duration/Estimated Length of Stay: 12-14 days OT Treatment/Interventions: Balance/vestibular  training;Cognitive remediation/compensation;Community reintegration;Discharge planning;Disease mangement/prevention;DME/adaptive equipment instruction;Functional electrical stimulation;Functional mobility training;Neuromuscular re-education;Pain management;Psychosocial support;Self Care/advanced ADL retraining;UE/LE Coordination activities;UE/LE Strength taining/ROM;Patient/family education;Therapeutic Activities;Therapeutic Exercise;Wheelchair propulsion/positioning OT Self Feeding Anticipated Outcome(s): (S) OT Basic Self-Care Anticipated Outcome(s): (S) OT Toileting Anticipated Outcome(s): (S) OT Bathroom Transfers Anticipated Outcome(s): (S) OT Recommendation Recommendations for Other Services: Speech consult Patient destination: Home Follow Up Recommendations: Home health OT Equipment Recommended: To be determined   Skilled Therapeutic Intervention Skilled OT evaluation performed. Edu was deferred d/t pt with significant cognitive deficits. Pt set up to eat lunch and utensil fitted with built up handle. Pt required min A to scoop food and he was able to bring utensil to mouth with cueing only. Pt completed UB and LB bathing/dressing with min A overall. Pt disoriented x3 and required frequent vc for reorienting. Pt completed functional mobility to bathroom with min A. Pt left sitting up in w/c with all needs met and chair alarm belt set.   OT Evaluation Precautions/Restrictions  Precautions Precautions: Fall;Cervical Precaution Booklet Issued: No Precaution Comments: reviewed cervical precautions Required Braces or Orthoses: Cervical Brace Cervical Brace: Soft collar;At all times Restrictions Weight Bearing Restrictions: No General Chart Reviewed: Yes Family/Caregiver Present: No Vital Signs Therapy Vitals Temp: 98.1 F (36.7 C) Temp Source: Oral Pulse Rate: 66 Resp: 17 BP: 109/61 Patient Position (if appropriate): Sitting Oxygen Therapy O2 Device: Room Air Pain Pain  Assessment Pain Scale: 0-10 Pain Score: 0-No pain Home Living/Prior Functioning Home Living Available Help at Discharge: Family, Available PRN/intermittently Type of Home: House Home Access: Stairs to enter Technical brewer of Steps: 2 Entrance Stairs-Rails: None Home Layout: One level Bathroom Shower/Tub: Tub/shower unit, Architectural technologist: Standard Bathroom Accessibility: No  Lives With: Alone(all information was obtained from chart) Prior Function Comments: Has been using RW since d/c from cervical surgery. Reports enjoying riding his tractor ADL ADL Equipment Provided: Feeding equipment, Other (comment)(built up handle)  Eating: Moderate cueing, Minimal assistance Where Assessed-Eating: Wheelchair Grooming: Minimal assistance, Minimal cueing Where Assessed-Grooming: Sitting at sink Upper Body Bathing: Moderate cueing, Minimal assistance Where Assessed-Upper Body Bathing: Sitting at sink Lower Body Bathing: Moderate cueing, Minimal assistance Where Assessed-Lower Body Bathing: Wheelchair Upper Body Dressing: Minimal assistance Where Assessed-Upper Body Dressing: Wheelchair Lower Body Dressing: Moderate assistance Where Assessed-Lower Body Dressing: Wheelchair Toileting: Minimal assistance, Moderate cueing Where Assessed-Toileting: Glass blower/designer: Minimal assistance, Moderate verbal cueing Toilet Transfer Method: Counselling psychologist: Raised toilet seat Vision Patient Visual Report: No change from baseline Vision Assessment?: Vision impaired- to be further tested in functional context Perception  Perception: Within Functional Limits Praxis Praxis: Intact Cognition Overall Cognitive Status: Impaired/Different from baseline Arousal/Alertness: Awake/alert Orientation Level: Person;Place;Situation Person: Oriented Place: Disoriented Situation: Disoriented Year: Other (Comment)(1700) Month: (unable) Day of Week:  Incorrect(Thursday) Memory: Impaired Memory Impairment: Storage deficit;Decreased recall of new information Immediate Memory Recall: Sock;Blue;Bed Attention: Sustained Focused Attention: Impaired Focused Attention Impairment: Verbal basic Sustained Attention: Impaired Sustained Attention Impairment: Functional basic;Verbal basic Awareness: Impaired Awareness Impairment: Intellectual impairment Problem Solving: Impaired Problem Solving Impairment: Verbal basic;Functional basic Executive Function: (all impaired) Safety/Judgment: Impaired Sensation Sensation Light Touch: Not tested(unable to assess formally) Coordination Gross Motor Movements are Fluid and Coordinated: No Fine Motor Movements are Fluid and Coordinated: No Motor  Motor Motor: Other (comment) Motor - Skilled Clinical Observations: generalized weakness Mobility  Bed Mobility Bed Mobility: (Pt OOB upon arrival) Transfers Sit to Stand: Minimal Assistance - Patient > 75% Stand to Sit: Minimal Assistance - Patient > 75%  Trunk/Postural Assessment  Cervical Assessment Cervical Assessment: Exceptions to WFL(s/p cervical sx) Thoracic Assessment Thoracic Assessment: Exceptions to WFL(increased kyphosis) Lumbar Assessment Lumbar Assessment: Exceptions to WFL(posterior pelvic tilt) Postural Control Postural Control: Deficits on evaluation  Balance Balance Balance Assessed: Yes Static Sitting Balance Static Sitting - Balance Support: No upper extremity supported;Feet supported Static Sitting - Level of Assistance: 6: Modified independent (Device/Increase time) Dynamic Sitting Balance Dynamic Sitting - Balance Support: Feet supported;During functional activity;No upper extremity supported Dynamic Sitting - Level of Assistance: 5: Stand by assistance Static Standing Balance Static Standing - Balance Support: Bilateral upper extremity supported;During functional activity Static Standing - Level of Assistance: 5: Stand  by assistance Dynamic Standing Balance Dynamic Standing - Balance Support: Bilateral upper extremity supported;During functional activity Dynamic Standing - Level of Assistance: 4: Min assist Extremity/Trunk Assessment RUE Assessment RUE Assessment: Exceptions to [facility] General Strength Comments: Not tested d/t limited AROM RUE AROM (degrees) Right Shoulder Flexion: 105 Degrees Right Shoulder ABduction: 90 Degrees Right Elbow Flexion: 150 Right Elbow Extension: 0 LUE Assessment LUE Assessment: Exceptions to WFL LUE AROM (degrees) Left Shoulder Flexion: 45 Degrees Left Shoulder ABduction: 25 Degrees Left Elbow Flexion: 150 Left Elbow Extension: 0   See Function Navigator for Current Functional Status.   Refer to Care Plan for Long Term Goals  Recommendations for other services: None    Discharge Criteria: Patient will be discharged from OT if patient refuses treatment 3 consecutive times without medical reason, if treatment goals not met, if there is a change in medical status, if patient makes no progress towards goals or if patient is discharged from hospital.  The above assessment, treatment plan, treatment alternatives and goals were discussed and mutually agreed upon: by patient  Curtis Sites 06/20/2018, 3:39 PM

## 2018-06-20 NOTE — Progress Notes (Signed)
[place] PHYSICAL MEDICINE & REHABILITATION     PROGRESS NOTE    Subjective/Complaints:  Pt up with therapy early this morning   ROS: Patient denies fever, rash, sore throat, blurred vision, nausea, vomiting, diarrhea, cough, shortness of breath or chest pain, joint or back pain, headache, or mood change.   Objective:  No results found. Recent Labs    06/18/18 0539 06/20/18 0555  WBC 4.9 5.0  HGB 7.8* 8.8*  HCT 24.1* 27.2*  PLT 215 267   Recent Labs    06/18/18 0539 06/19/18 0436 06/20/18 0555  NA 137  --  136  K 3.6  --  4.1  CL 108  --  105  GLUCOSE 112*  --  122*  BUN 26*  --  19  CREATININE 1.35* 1.20 1.23  CALCIUM 7.6*  --  8.0*   CBG (last 3)  Recent Labs    06/19/18 1719 06/19/18 2127 06/20/18 0628  GLUCAP 138* 159* 119*    Wt Readings from Last 3 Encounters:  06/20/18 75.2 kg  06/19/18 74.8 kg  05/23/18 79.4 kg     Intake/Output Summary (Last 24 hours) at 06/20/2018 0846 Last data filed at 06/20/2018 0537 Gross per 24 hour  Intake -  Output 300 ml  Net -300 ml    Vital Signs: Blood pressure (!) 142/74, pulse 65, temperature 98.8 F (37.1 C), temperature source Oral, resp. rate 18, height 5' 7.5" (1.715 m), weight 75.2 kg, SpO2 92 %. Physical Exam:  Constitutional: No distress . Vital signs reviewed. HEENT: EOMI, oral membranes moist Neck: supple Cardiovascular: RRR without murmur. No JVD    Respiratory: CTA Bilaterally without wheezes or rales. Normal effort    GI: BS +, non-tender, non-distended  Neurological: He isalert.oriented to self, hospital  Follows simple commands Motor strength is 2 to 2+/5 both deltoids 3/5 both biceps 3/5 bilateral finger flexors and extensors 4/5 bilateral hip flexor knee extensor ankle dorsiflexors. Decreased LT in bilateral hands. Psych: pleasant and generally cooperative Skin: surgical incision clean, healed   Assessment/Plan: 1. Functional and mobility deficits secondary to cervical  myelopath/ACDF complicated by right MCA/PCA watershed infarct which require 3+ hours per day of interdisciplinary therapy in a comprehensive inpatient rehab setting. Physiatrist is providing close team supervision and 24 hour management of active medical problems listed below. Physiatrist and rehab team continue to assess barriers to discharge/monitor patient progress toward functional and medical goals.  Function:  Bathing Bathing position      Bathing parts      Bathing assist        Upper Body Dressing/Undressing Upper body dressing                    Upper body assist        Lower Body Dressing/Undressing Lower body dressing                                  Lower body assist        Toileting Toileting          Toileting assist     Transfers Chair/bed transfer             Locomotion Ambulation           Wheelchair          Cognition Comprehension    Expression    Social Interaction    Problem Solving    Memory  Medical Problem List and Plan: 1.Decreased functional mobilitysecondary to cervical stenosis with myelopathy status post surgical decompression and fusion 05/23/2018 complicated by right MCA/PCA watershed and right parietal punctate infarcts after cardiac catheterization 06/05/2018  -beginning inpatient rehab 2. DVT Prophylaxis/Anticoagulation: Subcutaneous Lovenox.  3. Pain Management:Tylenol as needed 4. Mood:Provide emotional support 5. Neuropsych: This patientiscapable of making decisions on hisown behalf. 6. Skin/Wound Care:Routine skin checks 7. Fluids/Electrolytes/Nutrition:encourage PO fluids  -I personally reviewed all of the patient's labs today   8.CAD with cardiomyopathy. Medical management. Continue aspirin and Plavix 9.ICA stenosis. Follow-up vascular surgery/ await plan procedure for carotid stenting. Likely as outpt 10.CKD stage III. Follow-up chemistries 11.Hypertension. Coreg  3.125 mg twice daily, clonidine patch 0.1 mg weekly 12.Esophagitis. Pepcid 13.Dysphagia. Dysphasia #2 honey thick liquids. Monitor hydration 14. Acute on chronic anemia: hgb now up to 8.8 on 9/20   LOS (Days) 1 A FACE TO FACE EVALUATION WAS PERFORMED  Ranelle Oyster, MD 06/20/2018 8:46 AM

## 2018-06-21 ENCOUNTER — Inpatient Hospital Stay (HOSPITAL_COMMUNITY): Payer: BLUE CROSS/BLUE SHIELD | Admitting: Physical Therapy

## 2018-06-21 ENCOUNTER — Inpatient Hospital Stay (HOSPITAL_COMMUNITY): Payer: BLUE CROSS/BLUE SHIELD | Admitting: Speech Pathology

## 2018-06-21 ENCOUNTER — Inpatient Hospital Stay (HOSPITAL_COMMUNITY): Payer: BLUE CROSS/BLUE SHIELD | Admitting: Occupational Therapy

## 2018-06-21 DIAGNOSIS — I63511 Cerebral infarction due to unspecified occlusion or stenosis of right middle cerebral artery: Secondary | ICD-10-CM

## 2018-06-21 MED ORDER — INFLUENZA VAC SPLIT HIGH-DOSE 0.5 ML IM SUSY
0.5000 mL | PREFILLED_SYRINGE | INTRAMUSCULAR | Status: AC
  Administered 2018-06-22: 0.5 mL via INTRAMUSCULAR
  Filled 2018-06-21: qty 0.5

## 2018-06-21 NOTE — Progress Notes (Signed)
[facility] PHYSICAL MEDICINE [facility] RE[facility]ABILITATION     PROGRESS NOTE    Subjective/Complaints:   Pt without c/os denies bowel or bladder issues  ROS: Patient denies fever, rash, sore throat, blurred vision, nausea, vomiting, diarrhea, cough, shortness of breath or chest pain, joint or back pain, headache, or mood change.   Objective:  No results found. Recent Labs    06/20/18 0555  WBC 5.0  HGB 8.8*  HCT 27.2*  PLT 267   Recent Labs    06/19/18 0436 06/20/18 0555  NA  --  136  K  --  4.1  CL  --  105  GLUCOSE  --  122*  BUN  --  19  CREATININE 1.20 1.23  CALCIUM  --  8.0*   CBG (last 3)  Recent Labs    06/20/18 0628 06/20/18 1127 06/20/18 1648  GLUCAP 119* 117* 112*    Wt Readings from Last 3 Encounters:  06/20/18 75.2 kg  06/19/18 74.8 kg  05/23/18 79.4 kg     Intake/Output Summary (Last 24 hours) at 06/21/2018 0817 Last data filed at 06/21/2018 0757 Gross per 24 hour  Intake 120 ml  Output -  Net 120 ml    Vital Signs: Blood pressure 130/60, pulse 61, temperature 98.8 F (37.1 C), temperature source Oral, resp. rate 18, height 5' 7.5" (1.715 m), weight 75.2 kg, SpO2 98 %. Physical Exam:  Constitutional: No distress . Vital signs reviewed. HEENT: EOMI, oral membranes moist Neck: supple Cardiovascular: RRR without murmur. No JVD    Respiratory: CTA Bilaterally without wheezes or rales. Normal effort    GI: BS +, non-tender, non-distended  Neurological: He isalert.oriented to self, hospital  Follows simple commands Motor strength is 2 to 2+/5 both deltoids 3/5 both biceps 3/5 bilateral finger flexors and extensors 4/5 bilateral hip flexor knee extensor ankle dorsiflexors. Decreased LT in bilateral hands. Psych: pleasant and generally cooperative Skin: surgical incision clean, healed   Assessment/Plan: 1. Functional and mobility deficits secondary to cervical myelopath/ACDF complicated by right MCA/PCA watershed infarct which require 3+ hours  per day of interdisciplinary therapy in a comprehensive inpatient rehab setting. Physiatrist is providing close team supervision and 24 hour management of active medical problems listed below. Physiatrist and rehab team continue to assess barriers to discharge/monitor patient progress toward functional and medical goals.  Function:  Bathing Bathing position   Position: Wheelchair/chair at sink  Bathing parts Body parts bathed by patient: Right arm, Left arm, Chest, Abdomen, Front perineal area, Buttocks, Right upper leg, Left upper leg Body parts bathed by helper: Right lower leg, Left lower leg, Back  Bathing assist Assist Level: Touching or steadying assistance(Pt > 75%)      Upper Body Dressing/Undressing Upper body dressing   What is the patient wearing?: Hospital gown                Upper body assist Assist Level: Touching or steadying assistance(Pt > 75%)      Lower Body Dressing/Undressing Lower body dressing   What is the patient wearing?: Underwear Underwear - Performed by patient: Thread/unthread right underwear leg, Pull underwear up/down Underwear - Performed by helper: Thread/unthread left underwear leg                          Lower body assist Assist for lower body dressing: Touching or steadying assistance (Pt > 75%)      Toileting Toileting   Toileting steps completed by patient: Adjust  clothing prior to toileting, Performs perineal hygiene, Adjust clothing after toileting Toileting steps completed by helper: Adjust clothing prior to toileting, Performs perineal hygiene, Adjust clothing after toileting Toileting Assistive Devices: Grab bar or rail  Toileting assist Assist level: Touching or steadying assistance (Pt.75%)   Transfers Chair/bed transfer   Chair/bed transfer method: Ambulatory Chair/bed transfer assist level: Touching or steadying assistance (Pt > 75%) Chair/bed transfer assistive device: Patent attorney      Max distance: 25 Assist level: Touching or steadying assistance (Pt > 75%)   Wheelchair          Cognition Comprehension Comprehension assist level: Understands basic 75 - 89% of the time/ requires cueing 10 - 24% of the time  Expression Expression assist level: Expresses basic 50 - 74% of the time/requires cueing 25 - 49% of the time. Needs to repeat parts of sentences.  Social Interaction Social Interaction assist level: Interacts appropriately 25 - 49% of time - Needs frequent redirection.  Problem Solving Problem solving assist level: Solves basic 25 - 49% of the time - needs direction more than half the time to initiate, plan or complete simple activities  Memory Memory assist level: Recognizes or recalls less than 25% of the time/requires cueing greater than 75% of the time   Medical Problem List and Plan: 1.Decreased functional mobilitysecondary to cervical stenosis with myelopathy status post surgical decompression and fusion 05/23/2018 complicated by right MCA/PCA watershed and right parietal punctate infarcts after cardiac catheterization 06/05/2018  -[facility]IR PT, OT 2. DVT Prophylaxis/Anticoagulation: Subcutaneous Lovenox.  3. Pain Management:Tylenol as needed 4. Mood:Provide emotional support 5. Neuropsych: This patientiscapable of making decisions on hisown behalf. 6. Skin/Wound Care:Routine skin checks 7. Fluids/Electrolytes/Nutrition:encourage PO fluids  -I personally reviewed all of the patient's labs today   8.CAD with cardiomyopathy. Medical management. Continue aspirin and Plavix 9.ICA stenosis. Follow-up vascular surgery/ await plan procedure for carotid stenting. Likely as outpt 10.CKD stage III. Last creat 1.2- stable 11.Hypertension. Coreg 3.125 mg twice daily, clonidine patch 0.1 mg weekly Vitals:   06/20/18 1950 06/21/18 0329  BP:  130/60  Pulse: 62 61  Resp:  18  Temp: 98.2 F (36.8 C) 98.8 F (37.1 C)  SpO2: 100% 98%  controlled  9/21 12.Esophagitis. Pepcid 13.Dysphagia. Dysphasia #2 honey thick liquids. Monitor hydration 14. Acute on chronic anemia: hgb now up to 8.8 on 9/20   LOS (Days) 2 A FACE TO FACE EVALUATION WAS PERFORMED  Erick Colace, MD 06/21/2018 8:17 AM

## 2018-06-21 NOTE — Progress Notes (Signed)
Physical Therapy Session Note  Patient Details  Name: Ian Ramos MRN: [id number] Date of Birth: 1946-02-27  Today's Date: 06/21/2018 PT Individual Time: 1300-1330 PT Individual Time Calculation (min): 30 min   Short Term Goals: Week 1:  PT Short Term Goal 1 (Week 1): Pt will perform bed mobility consistent minA PT Short Term Goal 2 (Week 1): Pt will perform transfers consistent minA PT Short Term Goal 3 (Week 1): Pt will ambulate 8' minA with LRAD  Skilled Therapeutic Interventions/Progress Updates:    Pt received seated in w/c in room, agreeable to PT. No complaints of pain. Sit to stand with min A to RW. Ambulation x 25', x 35' with RW and min A, verbal and manual cues for upright posture and for R hand grip on RW. Attempt seated ball kick while seated on EOM, pt unable to process how to perform activity quickly enough to return ball by kicking it. Attempt to perform seated ball toss, pt lacks enough BUE strength and ROM to perform ball toss. Seated ball pass back and forth with therapist while counting to 10, pt is able to count all numbers without cueing, transition to saying letters in alphabetical order while performing ball pass and pt gets stuck on the letter "D" and is unable to proceed with sequence even with cueing for next letter. Pt left seated in w/c in room with quick-release belt and chair alarm in place, needs in reach.  Therapy Documentation Precautions:  Precautions Precautions: Fall, Cervical Precaution Booklet Issued: No Precaution Comments: reviewed cervical precautions Required Braces or Orthoses: Cervical Brace Cervical Brace: Soft collar, At all times Restrictions Weight Bearing Restrictions: No  See Function Navigator for Current Functional Status.   Therapy/Group: Individual Therapy  Peter Congo, PT, DPT  06/21/2018, 3:57 PM

## 2018-06-21 NOTE — Progress Notes (Signed)
Speech Language Pathology Daily Session Note  Patient Details  Name: SAMARI SPILSBURY MRN: [id number] Date of Birth: 1946/04/06  Today's Date: 06/21/2018 SLP Individual Time: 5093-2671 SLP Individual Time Calculation (min): 25 min  Short Term Goals: Week 1: SLP Short Term Goal 1 (Week 1): Patient will consume single, small sips of thin liquids via straw with minimal overt s/s of aspiration with Min A verbal cues over 2 sessions prior to upgrade.  SLP Short Term Goal 2 (Week 1): Patient will consume trials of Dys. 3 texutres with efficient mastication and complete oral clearance without overt s/s of aspiration over 2 sessions with Min A verbal cues prior to upgrade.  SLP Short Term Goal 3 (Week 1): Patient will demonstrate sustained attention to functional tasks for 5 minutes with Mod A verbal cues for redirection.  SLP Short Term Goal 4 (Week 1): Patient will demonstrate functional problem solving for basic and familiar tasks with Mod A verbal cues.  SLP Short Term Goal 5 (Week 1): Patient will utilize external aids for orientation to place, time and situation with Max A multimodal cues.   Skilled Therapeutic Interventions: Skilled treatment session focused on dysphagia and cognitive goals. SLP facilitated session by providing oral care via the suction toothbrush. Patient consumed trials of thin liquids via single straw sips with use of multiple swallows and one delayed cough. Intermittent delayed and subtle throat clearing noted with trials of ice chips and thin liquids via spoon. However, difficult to determine if throat clearing is directly related to trials due to to the time between trials and throat clearing. Recommend continued trials with SLP. Patient initially required total A for orientation to time and place, however, once visual aids were initiated by SLP, patient utilized with only Min A verbal cues for orientation. Patient left upright in bed with alarm on and all needs within reach.  Continue with current plan of care.      Function:  Eating Eating   Modified Consistency Diet: No(with trials from SLP ) Eating Assist Level: Help managing cup/glass;Supervision or verbal cues;Set up assist for   Eating Set Up Assist For: Opening containers       Cognition Comprehension Comprehension assist level: Understands basic 75 - 89% of the time/ requires cueing 10 - 24% of the time  Expression   Expression assist level: Expresses basic 50 - 74% of the time/requires cueing 25 - 49% of the time. Needs to repeat parts of sentences.  Social Interaction Social Interaction assist level: Interacts appropriately 25 - 49% of time - Needs frequent redirection.  Problem Solving Problem solving assist level: Solves basic 25 - 49% of the time - needs direction more than half the time to initiate, plan or complete simple activities  Memory Memory assist level: Recognizes or recalls less than 25% of the time/requires cueing greater than 75% of the time    Pain No/Denies Pain   Therapy/Group: Individual Therapy  Brahim Dolman 06/21/2018, 10:25 AM

## 2018-06-21 NOTE — Progress Notes (Signed)
Physical Therapy Session Note  Patient Details  Name: Ian Ramos MRN: [id number] Date of Birth: [birth year]  Today's Date: 06/21/2018 PT Individual Time: 1000-1055 PT Individual Time Calculation (min): 55 min   Short Term Goals: Week 1:  PT Short Term Goal 1 (Week 1): Pt will perform bed mobility consistent minA PT Short Term Goal 2 (Week 1): Pt will perform transfers consistent minA PT Short Term Goal 3 (Week 1): Pt will ambulate 44' minA with LRAD  Skilled Therapeutic Interventions/Progress Updates:    Pt not oriented to time or place despite visual and verbal cues. pt performs bed mobility with increased time and mod A for trunk and LE control.  Sit <> stand throughout session with min A, cues for UE placement each attempt.  Gait training 15' x 3, 50' x 2 with RW and min guard.  Decreased cadence and cues for upright posture.  Standing peg task with pt unable to follow picture, min hand over hand assist for placing pegs in foam.  nustep for ROM for LEs and UEs x 6 minutes level 4.  Pt left in room with alarm set, needs at hand.  Therapy Documentation Precautions:  Precautions Precautions: Fall, Cervical Precaution Booklet Issued: No Precaution Comments: reviewed cervical precautions Required Braces or Orthoses: Cervical Brace Cervical Brace: Soft collar, At all times Restrictions Weight Bearing Restrictions: No Pain:  pt grimaces with sit <> stand, states "It hurts when I get up"  Pt unable to tell therapist where the pain was.  When PT offered to ask RN for pain meds, pt states "I reckon I don't need any"   Therapy/Group: Individual Therapy  Emmer Lillibridge 06/21/2018, 10:58 AM

## 2018-06-21 NOTE — Progress Notes (Signed)
Occupational Therapy Session Note  Patient Details  Name: Ian Ramos MRN: [id number] Date of Birth: [birth year]  Today's Date: 06/21/2018 OT Individual Time: 1415-1530 OT Individual Time Calculation (min): 75 min    Short Term Goals: Week 1:  OT Short Term Goal 1 (Week 1): Pt will don shirt with (S) OT Short Term Goal 2 (Week 1): Pt will scoop food with LRD and CGA OT Short Term Goal 3 (Week 1): Pt will maintain attention to simple grooming task with min cueing OT Short Term Goal 4 (Week 1): Pt will transfer to toilet with CGA  Skilled Therapeutic Interventions/Progress Updates:    Pt received in w/c and stated "whatever" when asked if he was ready for therapy. Pt declined a bath and already had clean clothing on. Pt continues to have difficulty with orientation.  He stated it was October, he was in [place] and he did not know why he was in the hospital. Pt taken to gym to work on upright sitting posture and BUE AROM.  A/arom with PNF D1 pattern and small arm circles.  Pt placed at hi/low table to work on table top slides of arm horizontal abduction and adduction. Active finger flexion and extension using towel scrunches.  Pt attempted to use light yellow soft clothespins, but he was not able to hold or control his [facility] to squeeze it open.  Pt worked with one inch pegs using first his R then his L hand to remove 15 pegs from the board and transfer them to a container.  This took pt considerable time but he was able to do it.  He then worked on shoulder AROM using dowel bar with A/arom to assist with arm movement for rowing in circles, front back rows, and horizontal abd/add. Pt taken back to his room.  Pt stated he wanted to stay in the w/c.  Chair alarm set and all needs met.  Call light in reach.  Therapy Documentation Precautions:  Precautions Precautions: Fall, Cervical Precaution Booklet Issued: No Precaution Comments: reviewed cervical precautions Required Braces or Orthoses:  Cervical Brace Cervical Brace: Soft collar, At all times Restrictions Weight Bearing Restrictions: No    Vital Signs: Therapy Vitals Temp: 98.2 F (36.8 C) Temp Source: Oral Pulse Rate: 64 Resp: 19 BP: (!) 106/55 Patient Position (if appropriate): Sitting Oxygen Therapy SpO2: 100 % O2 Device: Room Air Pain: Pain Assessment Pain Score: 0-No pain ADL: ADL Equipment Provided: Feeding equipment, Other (comment)(built up handle) Eating: Moderate cueing, Minimal assistance Where Assessed-Eating: Wheelchair Grooming: Minimal assistance, Minimal cueing Where Assessed-Grooming: Sitting at sink Upper Body Bathing: Moderate cueing, Minimal assistance Where Assessed-Upper Body Bathing: Sitting at sink Lower Body Bathing: Moderate cueing, Minimal assistance Where Assessed-Lower Body Bathing: Wheelchair Upper Body Dressing: Minimal assistance Where Assessed-Upper Body Dressing: Wheelchair Lower Body Dressing: Moderate assistance Where Assessed-Lower Body Dressing: Wheelchair Toileting: Minimal assistance, Moderate cueing Where Assessed-Toileting: Glass blower/designer: Minimal assistance, Moderate verbal cueing Toilet Transfer Method: Counselling psychologist: Raised toilet seat  See Function Navigator for Current Functional Status.   Therapy/Group: Individual Therapy  Alexzandrea Normington 06/21/2018, 2:44 PM

## 2018-06-22 ENCOUNTER — Inpatient Hospital Stay (HOSPITAL_COMMUNITY): Payer: BLUE CROSS/BLUE SHIELD

## 2018-06-22 NOTE — Progress Notes (Signed)
[place] PHYSICAL MEDICINE & REHABILITATION     PROGRESS NOTE    Subjective/Complaints:   Remains confused thinks he is in [place], "thurs"  ROS: no CP, SOB, N/V/D - limited ROS due to cognition  Objective:  No results found. Recent Labs    06/20/18 0555  WBC 5.0  HGB 8.8*  HCT 27.2*  PLT 267   Recent Labs    06/20/18 0555  NA 136  K 4.1  CL 105  GLUCOSE 122*  BUN 19  CREATININE 1.23  CALCIUM 8.0*   CBG (last 3)  Recent Labs    06/20/18 0628 06/20/18 1127 06/20/18 1648  GLUCAP 119* 117* 112*    Wt Readings from Last 3 Encounters:  06/22/18 73.4 kg  06/19/18 74.8 kg  05/23/18 79.4 kg     Intake/Output Summary (Last 24 hours) at 06/22/2018 0757 Last data filed at 06/21/2018 1730 Gross per 24 hour  Intake 480 ml  Output 150 ml  Net 330 ml    Vital Signs: Blood pressure 129/60, pulse 61, temperature 98 F (36.7 C), temperature source Oral, resp. rate 16, height 5' 7.5" (1.715 m), weight 73.4 kg, SpO2 99 %. Physical Exam:  Constitutional: No distress . Vital signs reviewed. HEENT: EOMI, oral membranes moist Neck: supple Cardiovascular: RRR without murmur. No JVD    Respiratory: CTA Bilaterally without wheezes or rales. Normal effort    GI: BS +, non-tender, non-distended  Neurological: He isalert.oriented to self, hospital but not city  Follows simple commands Motor strength is 2 to 2+/5 both deltoids 3/5 both biceps 3/5 bilateral finger flexors and extensors 4/5 bilateral hip flexor knee extensor ankle dorsiflexors.unchanged Decreased LT in bilateral hands. Psych: pleasant and generally cooperative Skin: surgical incision clean, healed   Assessment/Plan: 1. Functional and mobility deficits secondary to cervical myelopath/ACDF complicated by right MCA/PCA watershed infarct which require 3+ hours per day of interdisciplinary therapy in a comprehensive inpatient rehab setting. Physiatrist is providing close team supervision and 24 hour  management of active medical problems listed below. Physiatrist and rehab team continue to assess barriers to discharge/monitor patient progress toward functional and medical goals.  Function:  Bathing Bathing position   Position: Wheelchair/chair at sink  Bathing parts Body parts bathed by patient: Right arm, Left arm, Chest, Abdomen, Front perineal area, Buttocks, Right upper leg, Left upper leg Body parts bathed by helper: Right lower leg, Left lower leg, Back  Bathing assist Assist Level: Touching or steadying assistance(Pt > 75%)      Upper Body Dressing/Undressing Upper body dressing   What is the patient wearing?: Hospital gown                Upper body assist Assist Level: Touching or steadying assistance(Pt > 75%)      Lower Body Dressing/Undressing Lower body dressing   What is the patient wearing?: Underwear Underwear - Performed by patient: Thread/unthread right underwear leg, Pull underwear up/down Underwear - Performed by helper: Thread/unthread left underwear leg                          Lower body assist Assist for lower body dressing: Touching or steadying assistance (Pt > 75%)      Toileting Toileting   Toileting steps completed by patient: Adjust clothing prior to toileting, Performs perineal hygiene, Adjust clothing after toileting Toileting steps completed by helper: Adjust clothing prior to toileting, Performs perineal hygiene, Adjust clothing after toileting Toileting Assistive Devices: Grab bar or  rail  Toileting assist Assist level: Touching or steadying assistance (Pt.75%)   Transfers Chair/bed transfer   Chair/bed transfer method: Stand pivot Chair/bed transfer assist level: Touching or steadying assistance (Pt > 75%) Chair/bed transfer assistive device: Armrests, Patent attorney     Max distance: 35' Assist level: Touching or steadying assistance (Pt > 75%)   Wheelchair          Cognition Comprehension  Comprehension assist level: Understands basic 50 - 74% of the time/ requires cueing 25 - 49% of the time  Expression Expression assist level: Expresses basic 50 - 74% of the time/requires cueing 25 - 49% of the time. Needs to repeat parts of sentences.  Social Interaction Social Interaction assist level: Interacts appropriately 50 - 74% of the time - May be physically or verbally inappropriate.  Problem Solving Problem solving assist level: Solves basic 25 - 49% of the time - needs direction more than half the time to initiate, plan or complete simple activities  Memory Memory assist level: Recognizes or recalls 25 - 49% of the time/requires cueing 50 - 75% of the time   Medical Problem List and Plan: 1.Decreased functional mobilitysecondary to cervical stenosis with myelopathy status post surgical decompression and fusion 05/23/2018 complicated by right MCA/PCA watershed and right parietal punctate infarcts after cardiac catheterization 06/05/2018  -CIR PT, OT 2. DVT Prophylaxis/Anticoagulation: Subcutaneous Lovenox.  3. Pain Management:Tylenol as needed 4. Mood:Provide emotional support 5. Neuropsych: This patientiscapable of making decisions on hisown behalf. 6. Skin/Wound Care:Routine skin checks 7. Fluids/Electrolytes/Nutrition:encourage PO fluids  -I personally reviewed all of the patient's labs today   8.CAD with cardiomyopathy. Medical management. Continue aspirin and Plavix 9.ICA stenosis. Follow-up vascular surgery/ await plan procedure for carotid stenting. Likely as outpt 10.CKD stage III. Last creat 1.2- stable 11.Hypertension. Coreg 3.125 mg twice daily, clonidine patch 0.1 mg weekly Vitals:   06/21/18 1919 06/22/18 0434  BP: (!) 124/59 129/60  Pulse: 63 61  Resp: 18 16  Temp: 98.2 F (36.8 C) 98 F (36.7 C)  SpO2: 98% 99%  controlled 9/22 12.Esophagitis. Pepcid 13.Dysphagia. Dysphasia #2 honey thick liquids. Monitor hydration, I: , check  BMET in am 14. Acute on chronic anemia: hgb now up to 8.8 on 9/20, recheck CBC in am   LOS (Days) 3 A FACE TO FACE EVALUATION WAS PERFORMED  Erick Colace, MD 06/22/2018 7:57 AM

## 2018-06-22 NOTE — Progress Notes (Signed)
Occupational Therapy Session Note  Patient Details  Name: Ian Ramos MRN: [id number] Date of Birth: 09/05/1946  Today's Date: 06/22/2018 OT Individual Time: 1514-1623 OT Individual Time Calculation (min): 69 min    Short Term Goals: Week 1:  OT Short Term Goal 1 (Week 1): Pt will don shirt with (S) OT Short Term Goal 2 (Week 1): Pt will scoop food with LRD and CGA OT Short Term Goal 3 (Week 1): Pt will maintain attention to simple grooming task with min cueing OT Short Term Goal 4 (Week 1): Pt will transfer to toilet with CGA  Skilled Therapeutic Interventions/Progress Updates:    1:1. Pt recived in bed with no c/o pain. Pt flat thoughout session but agreeable to bathing and dressing at sink. Pt completes all transfers with no AD and VC for hand palcement/min A. Pt completes bathing at sink with min HOH A of LUE to reach for ADL items and bathe RUE. Pt dons button up shirt with MAX A to bring around back, thread RUE and button up shirt. Pt educated on alternative methods for fastening button up shirts such as button hook, vlecro or leaving shirt buttoned. Pt able to advance pants down hips prior to washing buttocks, but requires A to advance after washing peri area. Pt able to doff B socks with foot elevated on stool, however requires A to don B socks. Pt squeezes lotion onto B feet for grip strength and min A and spreads onto BLE/feet for NMR. Pt completes box and blocks assessment seated in w/c for BUE. RUE 14 blocks and LUE 13 blocks. Pt returned to room and stand pivot as stated above. Exited session with pt seatedin bed call light in reach and all needs met  Therapy Documentation Precautions:  Precautions Precautions: Fall, Cervical Precaution Booklet Issued: No Precaution Comments: reviewed cervical precautions Required Braces or Orthoses: Cervical Brace Cervical Brace: Soft collar, At all times Restrictions Weight Bearing Restrictions: No General:   Vital Signs: Therapy  Vitals Temp: 97.9 F (36.6 C) Temp Source: Oral Pulse Rate: (!) 58 Resp: 19 BP: 110/65 Patient Position (if appropriate): Lying Oxygen Therapy SpO2: 100 % O2 Device: Room Air Pain:   no pain reported  See Function Navigator for Current Functional Status.   Therapy/Group: Individual Therapy   M  06/22/2018, 4:01 PM 

## 2018-06-22 NOTE — Plan of Care (Signed)
  Problem: RH BLADDER ELIMINATION Goal: RH STG MANAGE BLADDER WITH ASSISTANCE Description STG Manage Bladder With min Assistance  Outcome: Not Progressing; incontinence at times   Problem: RH KNOWLEDGE DEFICIT; alert to self only; confused to time, place and situation Goal: RH STG INCREASE KNOWLEDGE OF DIABETES Description Pt and caregiver will be able to explain management of diabetes with diet restrictions, medications using cues/resources  Outcome: Not Progressing Goal: RH STG INCREASE KNOWLEDGE OF HYPERTENSION Description PT and caregiver will be able to manage HTN with dietary restrictions, medications and handouts as resources independently  Outcome: Not Progressing Goal: RH STG INCREASE KNOWLEGDE OF HYPERLIPIDEMIA Description PT and caregiver will be able to manage HLD using handouts, resources and medications with dietary restrictions independently  Outcome: Not Progressing Goal: RH STG INCREASE KNOWLEDGE OF STROKE PROPHYLAXIS Description PT and caregiver will be able to demonstrate management of stroke prophylaxis using handouts, resources independently  Outcome: Not Progressing

## 2018-06-23 ENCOUNTER — Inpatient Hospital Stay (HOSPITAL_COMMUNITY): Payer: BLUE CROSS/BLUE SHIELD | Admitting: Physical Therapy

## 2018-06-23 ENCOUNTER — Inpatient Hospital Stay (HOSPITAL_COMMUNITY): Payer: BLUE CROSS/BLUE SHIELD | Admitting: Speech Pathology

## 2018-06-23 ENCOUNTER — Inpatient Hospital Stay (HOSPITAL_COMMUNITY): Payer: BLUE CROSS/BLUE SHIELD

## 2018-06-23 ENCOUNTER — Encounter (HOSPITAL_COMMUNITY): Payer: BLUE CROSS/BLUE SHIELD | Admitting: Psychology

## 2018-06-23 LAB — CBC WITH DIFFERENTIAL/PLATELET
Abs Immature Granulocytes: 0 10*3/uL (ref 0.0–0.1)
BASOS PCT: 1 %
Basophils Absolute: 0.1 10*3/uL (ref 0.0–0.1)
EOS ABS: 0.2 10*3/uL (ref 0.0–0.7)
EOS PCT: 2 %
HEMATOCRIT: 26.1 % — AB (ref 39.0–52.0)
Hemoglobin: 8.5 g/dL — ABNORMAL LOW (ref 13.0–17.0)
IMMATURE GRANULOCYTES: 1 %
Lymphocytes Relative: 13 %
Lymphs Abs: 0.8 10*3/uL (ref 0.7–4.0)
MCH: 28.8 pg (ref 26.0–34.0)
MCHC: 32.6 g/dL (ref 30.0–36.0)
MCV: 88.5 fL (ref 78.0–100.0)
MONOS PCT: 8 %
Monocytes Absolute: 0.5 10*3/uL (ref 0.1–1.0)
NEUTROS PCT: 75 %
Neutro Abs: 5 10*3/uL (ref 1.7–7.7)
PLATELETS: 272 10*3/uL (ref 150–400)
RBC: 2.95 MIL/uL — ABNORMAL LOW (ref 4.22–5.81)
RDW: 11.9 % (ref 11.5–15.5)
WBC: 6.6 10*3/uL (ref 4.0–10.5)

## 2018-06-23 LAB — BASIC METABOLIC PANEL
ANION GAP: 10 (ref 5–15)
BUN: 20 mg/dL (ref 8–23)
CALCIUM: 8.3 mg/dL — AB (ref 8.9–10.3)
CO2: 22 mmol/L (ref 22–32)
CREATININE: 1.32 mg/dL — AB (ref 0.61–1.24)
Chloride: 104 mmol/L (ref 98–111)
GFR calc Af Amer: >60 mL/min (ref >60)
GFR, EST NON AFRICAN AMERICAN: 52 mL/min — AB (ref >60)
GLUCOSE: 107 mg/dL — AB (ref 70–99)
Potassium: 4.2 mmol/L (ref 3.5–5.1)
Sodium: 136 mmol/L (ref 135–145)

## 2018-06-23 LAB — URINALYSIS, COMPLETE (UACMP) WITH MICROSCOPIC
Bacteria, UA: NONE SEEN
Bilirubin Urine: NEGATIVE
Glucose, UA: NEGATIVE mg/dL
Hgb urine dipstick: NEGATIVE
Ketones, ur: NEGATIVE mg/dL
LEUKOCYTES UA: NEGATIVE
Nitrite: NEGATIVE
PH: 6 (ref 5.0–8.0)
Protein, ur: NEGATIVE mg/dL
SPECIFIC GRAVITY, URINE: 1.017 (ref 1.005–1.030)

## 2018-06-23 LAB — GLUCOSE, CAPILLARY
Glucose-Capillary: 130 mg/dL — ABNORMAL HIGH (ref 70–99)
Glucose-Capillary: 97 mg/dL (ref 70–99)

## 2018-06-23 NOTE — Progress Notes (Signed)
Speech Language Pathology Daily Session Note  Patient Details  Name: Ian Ramos MRN: [id number] Date of Birth: 1946/09/13  Today's Date: 06/23/2018 SLP Individual Time: 0815-0915 SLP Individual Time Calculation (min): 60 min  Short Term Goals: Week 1: SLP Short Term Goal 1 (Week 1): Patient will consume single, small sips of thin liquids via straw with minimal overt s/s of aspiration with Min A verbal cues over 2 sessions prior to upgrade.  SLP Short Term Goal 2 (Week 1): Patient will consume trials of Dys. 3 texutres with efficient mastication and complete oral clearance without overt s/s of aspiration over 2 sessions with Min A verbal cues prior to upgrade.  SLP Short Term Goal 3 (Week 1): Patient will demonstrate sustained attention to functional tasks for 5 minutes with Mod A verbal cues for redirection.  SLP Short Term Goal 4 (Week 1): Patient will demonstrate functional problem solving for basic and familiar tasks with Mod A verbal cues.  SLP Short Term Goal 5 (Week 1): Patient will utilize external aids for orientation to place, time and situation with Max A multimodal cues.   Skilled Therapeutic Interventions: Skilled treatment session focused on cognition and dysphagia goals. SLP received pt in bed with Max A required for oral care d/t physical deficits and cognitive difficulty sequencing activity. Mod A cues were required to utilize visual aids for recall of orientation informaiton and Total A to recall current situation. Pt also required multiple repetitions of each question or task SLP presented. Pt initially responds "uh" with confused look on face and performance increases with visual demonstration and verbal repetition of information. Pt able to independently demonstrate use of call light. Pt consumed 8 oz thin water via small straw sips with no overt s/s of aspiration. Recommend water protocol, order and information posted. Pt with poor task tolerance as he appeared frustrated  by the need to consume thin water with SLP. Pt unable to demonstrate/indicate fact that he is on altered diet and didn't display any want/need for thin water. Education provided to help with awareness/understanding. Pt left upright in bed, bed alarm on and all needs within reach. Continue per current plan of care.      Function:  Eating Eating   Modified Consistency Diet: No(Trials of thin water with SLP) Eating Assist Level: Help managing cup/glass;Supervision or verbal cues;Set up assist for   Eating Set Up Assist For: Opening containers       Cognition Comprehension Comprehension assist level: Understands basic 25 - 49% of the time/ requires cueing 50 - 75% of the time  Expression   Expression assist level: Expresses basic 25 - 49% of the time/requires cueing 50 - 75% of the time. Uses single words/gestures.  Social Interaction Social Interaction assist level: Interacts appropriately less than 25% of the time. May be withdrawn or combative.;Interacts appropriately 25 - 49% of time - Needs frequent redirection.  Problem Solving Problem solving assist level: Solves basic less than 25% of the time - needs direction nearly all the time or does not effectively solve problems and may need a restraint for safety;Solves basic 25 - 49% of the time - needs direction more than half the time to initiate, plan or complete simple activities  Memory Memory assist level: Recognizes or recalls 25 - 49% of the time/requires cueing 50 - 75% of the time    Pain    Therapy/Group: Individual Therapy  Ian Ramos 06/23/2018, 9:59 AM

## 2018-06-23 NOTE — Progress Notes (Signed)
Physical Therapy Session Note  Patient Details  Name: Ian Ramos MRN: [id number] Date of Birth: [birth year]  Today's Date: 06/23/2018 PT Individual Time: 1400-1510 PT Individual Time Calculation (min): 70 min   Short Term Goals: Week 1:  PT Short Term Goal 1 (Week 1): Pt will perform bed mobility consistent minA PT Short Term Goal 2 (Week 1): Pt will perform transfers consistent minA PT Short Term Goal 3 (Week 1): Pt will ambulate 54' minA with LRAD  Skilled Therapeutic Interventions/Progress Updates:    pt rec'd in w/c. States "I have a lot of paper work to do".  When pt asked orientation questions pt able to state that the year is "50" and he is at a "medical building".  Pt unable to state why he is in the hospital despite cues.  Pt performs gait 50' x 2, 25' x 2 with RW and min guard.  Step ups with RW to 6'' step 2 x 5 reps with min A.  Standing balance with horseshoe toss with min A for balance, increased time for UE control.  Ball pass with reciting alphabet with pt able to make it to letter "N" with only 1 mistake requiring total cuing to correct.  Seated nut and bolt activity with the largest size bolts with pt able to perform with increased time and min A for shoulder control when fatigued.  Nustep x 8 minutes level 4 for UE/LE strength and endurance.  Pt more conversational today and gives 2 word answers appropriately during session.  Pt left with needs at hand, alarm set.  Therapy Documentation Precautions:  Precautions Precautions: Fall, Cervical Precaution Booklet Issued: No Precaution Comments: reviewed cervical precautions Required Braces or Orthoses: Cervical Brace Cervical Brace: Soft collar, At all times Restrictions Weight Bearing Restrictions: No Pain:  no c/o pain   Therapy/Group: Individual Therapy  Charday Capetillo 06/23/2018, 3:14 PM

## 2018-06-23 NOTE — Consult Note (Signed)
Neuropsychological Consultation   Patient:   Ian Ramos   DOB:   Aug 02, 1946  MR Number:  [id number]  Location:  [facility]M [facility] [id number] [place] [address] Dept: [phone] Loc: [phone]           Date of Service:   06/23/2018  Start Time:   11 AM End Time:   12 PM  Provider/Observer:  Arley Phenix, Psy.D.       Clinical Neuropsychologist       Billing Code/Service: [phone] 4 Units  Chief Complaint:    Ian Ramos is a 72 year old right-handed male who has a history of chronic kidney disease stage III and cervical stenosis with myelopathy.  The patient underwent cervical decompression and fusion on 05/23/2018 and was discharged home on 05/24/2018.  He was ambulating at a supervision level 300 feet with rolling walker at discharge.  The patient was readmitted on 05/28/2018 with chest pain and left shoulder pain.  After work-up and no indication of heart attack the patient's neurosurgery follow-up felt the chest and left shoulder pain were related to persistent C5 radiculopathy.  CT of cervical spine showed no fracture deformity or malalignment.  Postoperative fluid had collected and gas consistent with recent surgery.  CT angiogram of chest showing some potential esophagitis.  EKG showed left bundle branch block and no ischemic changes.  The patient underwent cardiac catheterization on 10/05/2017 showing occluded acute marginal, 75% proximal LAD, 75% OM 2 and 30% proximal to mid ischemia ejection fraction 39%.   Post cardiac catheterization with lethargy and altered mental status.  Rapid response was called.  CT/MRI showed subcentimeter acute early subacute infarctions, 2 and left superior frontal lobe and 2 in the right posterior lateral parietal lobe.  No associated mass-effect.  MRA with minimal flow related signal in the left cervical ICA and absent flow related signal within the  pectoris and cavernous segment of the left ICA.  On 06/09/2018 patient was found unresponsive in his chair with blood pressure systolic in the 40s heart rate in the 50s.  Code was called.  Therapy evaluations were completed with recommendations for physical medicine rehab consult and the patient was admitted to the comprehensive rehabilitation program.  Reason for Service:  Ian Ramos was referred for neuropsychological consultation due to coping and adjustment issues in dealing with his long hospital stay and series of medical events that have led to residual motor deficits and an expressive aphasia but improving.  Below is the HPI for the current admission.  OQH:UTML Ian Ramos is a 72 year old right-handed male with history of CKD stage III and cervical stenosis with myelopathy who underwent cervical decompression and fusion 05/23/2018 by Dr. Jordan Likes and discharged home 05/25/2018 ambulating at a supervision level 300 feet with rolling walker. Per chart review patient lives with sister.Supportive care of significant other.Had been using a rolling walker since recent cervical surgery. One level home with 2 steps to entry. He was readmitted 05/28/2018 with chest pain and left shoulder pain. Troponin negative, mildly elevated creatinine 1.39. Chest x-ray no acute disease. Neurosurgery follow-up felt that chest and left shoulder pain related to persistent C5 radiculopathy. CT cervical spine showed no fracture deformity or malalignment. Postoperative fluid collection and gas consistent with recent surgery. CT angiogram of chest showing some potential esophagitis. Esophagram was negative after GI follow-up and signed off. Echocardiogram with ejection fraction of 40% diffuse hypokinesis grade 1 diastolic dysfunction. EKG showed  left bundle branch block no ischemic changes. Cardiology service follow-up for question EKG changes with cardiac Myoview study completed completed 06/04/2018 with  findings consistent with ischemia ejection fraction 39%. Underwent cardiac catheterization 06/05/2018 showing occluded acute marginal, 75% proximal LAD, 75% OM 2 and 30% proximal to mid RCA. Medical management was recommended and was cleared to begin aspirin and Plavix. Post cardiac catheterization with lethargy and altered mental status changes. Rapid response was called. CT/MRI showed subcentimeter acute early subacute infarctions, 2 in the left superior frontal lobe and 2 in the right posterior lateral parietal lobe. No associated mass-effect. MRA with minimal flow related signal in the left cervical ICA and absent flow related signal within the petrous and cavernous segments of the left ICA. Neurology service is consulted for follow-up. Carotid Doppler showed right ICA 80 to 90% stenosis left ICA occluded. On 06/09/2018 patient found unresponsive in his chair blood pressures systolic in the 40s heart rate in the 50s. A code was called patient received atropine was intubated for respiratory support. Patient's creatinine did bump up to 1.98 BUN 36 placed on gentle IV fluids. Vascular surgery/Dr. Darrick Penna consulted for severe ICA disease tentatively planning for transfemoral carotid stent and await plan for procedure. Presently on a dysphagia #2 honey thick liquid diet. Therapy evaluations have been completed and ongoing with recommendations of physical medicine rehab consult. Patient was admitted for a comprehensive rehab program.  Current Status:  The patient can word finding difficulties, fluency and articulation difficulties, and circumlocutions.  Paraphasic errors were also noted.  The patient showed considerable difficulty with mental status although he was oriented to person and place.  The patient was unsure as to some of the medical issues that he has been having to cope with.  The patient had some degree of executive functioning deficits and difficulty with problem-solving.  Behavioral  Observation: Ian Ramos  presents as a 72 y.o.-year-old Right Caucasian Male who appeared his stated age. his dress was Appropriate and he was Well Groomed and his manners were Appropriate to the situation.  his participation was indicative of Appropriate, Inattentive and Redirectable behaviors.  There were any physical disabilities noted.  he displayed an appropriate level of cooperation and motivation.     Interactions:    Active Appropriate, Inattentive and Redirectable  Attention:   abnormal and attention span appeared shorter than expected for age  Memory:   abnormal; remote memory intact, recent memory impaired  Visuo-spatial:  not examined  Speech (Volume):  low  Speech:   non-fluent aphasia; non-fluent aphasia  Thought Process:  Coherent and Relevant  Though Content:  WNL; not suicidal and not homicidal  Orientation:   person and place  Judgment:   Poor  Planning:   Poor  Affect:    Blunted  Mood:    Depressed  Insight:   Shallow  Intelligence:   normal  Medical History:   Past Medical History:  Diagnosis Date  . Arthritis   . Neuromuscular disorder [facility])    Psychiatric History:  Patient denies any prior psychiatric history.  Family Med/Psych History:  Family History  Problem Relation Age of Onset  . Hypertension Other   . Heart failure Mother   . CAD Father        s/p CABG in his 30s  . Heart failure Father     Risk of Suicide/Violence: virtually non-existent patient denies any suicidal or homicidal ideation.  Impression/DX:  Ian Ramos is a 72 year old right-handed male who has a history  of chronic kidney disease stage III and cervical stenosis with myelopathy.  The patient underwent cervical decompression and fusion on 05/23/2018 and was discharged home on 05/24/2018.  He was ambulating at a supervision level 300 feet with rolling walker at discharge.  The patient was readmitted on 05/28/2018 with chest pain and left shoulder pain.  After  work-up and no indication of heart attack the patient's neurosurgery follow-up felt the chest and left shoulder pain were related to persistent C5 radiculopathy.  CT of cervical spine showed no fracture deformity or malalignment.  Postoperative fluid had collected and gas consistent with recent surgery.  CT angiogram of chest showing some potential esophagitis.  EKG showed left bundle branch block and no ischemic changes.  The patient underwent cardiac catheterization on 10/05/2017 showing occluded acute marginal, 75% proximal LAD, 75% OM 2 and 30% proximal to mid ischemia ejection fraction 39%.   Post cardiac catheterization with lethargy and altered mental status.  Rapid response was called.  CT/MRI showed subcentimeter acute early subacute infarctions, 2 and left superior frontal lobe and 2 in the right posterior lateral parietal lobe.  No associated mass-effect.  MRA with minimal flow related signal in the left cervical ICA and absent flow related signal within the pectoris and cavernous segment of the left ICA.  On 06/09/2018 patient was found unresponsive in his chair with blood pressure systolic in the 40s heart rate in the 50s.  Code was called.  Therapy evaluations were completed with recommendations for physical medicine rehab consult and the patient was admitted to the comprehensive rehabilitation program.  The patient can word finding difficulties, fluency and articulation difficulties, and circumlocutions.  Paraphasic errors were also noted.  The patient showed considerable difficulty with mental status although he was oriented to person and place.  The patient was unsure as to some of the medical issues that he has been having to cope with.  The patient had some degree of executive functioning deficits and difficulty with problem-solving.  Disposition/Plan:  I will follow-up with the patient later this week or first thing next week to further assess for any potential improvements in his overall  cognitive functioning and assist in discharge planning.          Electronically Signed   _______________________ Arley Phenix, Psy.D.

## 2018-06-23 NOTE — Progress Notes (Signed)
PHYSICAL MEDICINE & REHABILITATION     PROGRESS NOTE    Subjective/Complaints:  Lying in bed, perpendicularly, no new issues  ROS: Limited due to cognitive/behavioral   Objective:  No results found. Recent Labs    06/23/18 0701  WBC 6.6  HGB 8.5*  HCT 26.1*  PLT 272   No results for input(s): NA, K, CL, GLUCOSE, BUN, CREATININE, CALCIUM in the last 72 hours.  Invalid input(s): CO CBG (last 3)  Recent Labs    06/20/18 1127 06/20/18 1648  GLUCAP 117* 112*    Wt Readings from Last 3 Encounters:  06/23/18 72.1 kg  06/19/18 74.8 kg  05/23/18 79.4 kg     Intake/Output Summary (Last 24 hours) at 06/23/2018 0840 Last data filed at 06/22/2018 1728 Gross per 24 hour  Intake 220 ml  Output -  Net 220 ml    Vital Signs: Blood pressure (!) 106/53, pulse 61, temperature 98.2 F (36.8 C), temperature source Oral, resp. rate 16, height 5' 7.5" (1.715 m), weight 72.1 kg, SpO2 92 %. Physical Exam:  Constitutional: No distress . Vital signs reviewed. HEENT: EOMI, oral membranes moist Neck: supple Cardiovascular: RRR without murmur. No JVD    Respiratory: CTA Bilaterally without wheezes or rales. Normal effort    GI: BS +, non-tender, non-distended  Neurological: alert to self, hospitao. Motor strength is 2 to 2+/5 both deltoids 3/5 both biceps 3/5 bilateral finger flexors and extensors 4/5 bilateral hip flexor knee extensor ankle dorsiflexors.--stable Decreased LT in bilateral hands. Psych: pleasant and generally cooperative Skin: surgical incision clean, healed   Assessment/Plan: 1. Functional and mobility deficits secondary to cervical myelopath/ACDF complicated by right MCA/PCA watershed infarct which require 3+ hours per day of interdisciplinary therapy in a comprehensive inpatient rehab setting. Physiatrist is providing close team supervision and 24 hour management of active medical problems listed below. Physiatrist and rehab team continue to assess  barriers to discharge/monitor patient progress toward functional and medical goals.  Function:  Bathing Bathing position   Position: Wheelchair/chair at sink  Bathing parts Body parts bathed by patient: Right arm, Left arm, Chest, Abdomen, Front perineal area, Buttocks, Right upper leg, Left upper leg Body parts bathed by helper: Right lower leg, Left lower leg, Back  Bathing assist Assist Level: Touching or steadying assistance(Pt > 75%)      Upper Body Dressing/Undressing Upper body dressing   What is the patient wearing?: Hospital gown                Upper body assist Assist Level: Touching or steadying assistance(Pt > 75%)      Lower Body Dressing/Undressing Lower body dressing   What is the patient wearing?: Underwear Underwear - Performed by patient: Thread/unthread right underwear leg, Pull underwear up/down Underwear - Performed by helper: Thread/unthread left underwear leg                          Lower body assist Assist for lower body dressing: Touching or steadying assistance (Pt > 75%)      Toileting Toileting   Toileting steps completed by patient: Adjust clothing prior to toileting, Performs perineal hygiene, Adjust clothing after toileting Toileting steps completed by helper: Adjust clothing prior to toileting, Performs perineal hygiene, Adjust clothing after toileting Toileting Assistive Devices: Grab bar or rail  Toileting assist Assist level: Touching or steadying assistance (Pt.75%)   Transfers Chair/bed transfer   Chair/bed transfer method: Stand pivot Chair/bed transfer assist level: Touching  or steadying assistance (Pt > 75%) Chair/bed transfer assistive device: Armrests, Patent attorney     Max distance: 35' Assist level: Touching or steadying assistance (Pt > 75%)   Wheelchair          Cognition Comprehension Comprehension assist level: Understands basic 50 - 74% of the time/ requires cueing 25 - 49% of  the time  Expression Expression assist level: Expresses basic 50 - 74% of the time/requires cueing 25 - 49% of the time. Needs to repeat parts of sentences.  Social Interaction Social Interaction assist level: Interacts appropriately 50 - 74% of the time - May be physically or verbally inappropriate.  Problem Solving Problem solving assist level: Solves basic 50 - 74% of the time/requires cueing 25 - 49% of the time  Memory Memory assist level: Recognizes or recalls 50 - 74% of the time/requires cueing 25 - 49% of the time   Medical Problem List and Plan: 1.Decreased functional mobilitysecondary to cervical stenosis with myelopathy status post surgical decompression and fusion 05/23/2018 complicated by right MCA/PCA watershed and right parietal punctate infarcts after cardiac catheterization 06/05/2018  -CIR PT, OT 2. DVT Prophylaxis/Anticoagulation: Subcutaneous Lovenox.  3. Pain Management:Tylenol as needed 4. Mood:Provide emotional support 5. Neuropsych: This patientis not consistentlycapable of making decisions on hisown behalf.  -increased confusion: labs today, check urinalysis, cx 6. Skin/Wound Care:Routine skin checks 7. Fluids/Electrolytes/Nutrition:encourage PO fluids  -   8.CAD with cardiomyopathy. Medical management. Continue aspirin and Plavix 9.ICA stenosis. Follow-up vascular surgery/ await plan procedure for carotid stenting. Likely as outpt 10.CKD stage III. Last creat 1.2- stable 11.Hypertension. Coreg 3.125 mg twice daily, clonidine patch 0.1 mg weekly Vitals:   06/22/18 2211 06/23/18 0558  BP: (!) 104/56 (!) 106/53  Pulse: 66 61  Resp: 16 16  Temp: 97.6 F (36.4 C) 98.2 F (36.8 C)  SpO2: 95% 92%  controlled 9/23 12.Esophagitis. Pepcid 13.Dysphagia. Dysphasia #2 honey thick liquids. Monitor hydration, I: ,-labs pending 14. Acute on chronic anemia: hgb     -8.5 9/23 LOS (Days) 4 A FACE TO FACE EVALUATION WAS PERFORMED  Ranelle Oyster, MD 06/23/2018 8:40 AM

## 2018-06-23 NOTE — Progress Notes (Signed)
Occupational Therapy Session Note  Patient Details  Name: Ian Ramos MRN: [id number] Date of Birth: 05-07-1946  Today's Date: 06/23/2018 OT Individual Time: 1000-1100 OT Individual Time Calculation (min): 60 min    Short Term Goals: Week 1:  OT Short Term Goal 1 (Week 1): Pt will don shirt with (S) OT Short Term Goal 2 (Week 1): Pt will scoop food with LRD and CGA OT Short Term Goal 3 (Week 1): Pt will maintain attention to simple grooming task with min cueing OT Short Term Goal 4 (Week 1): Pt will transfer to toilet with CGA  Skilled Therapeutic Interventions/Progress Updates:    Pt supine in bed agreeable to b/d tasks. Pt was re-oriented to place several times throughout session with no carryover. Min A provided to come EOB. Pt requiring tactile cues to follow directions during bathing EOB, perseverating on washing face. Pt able to hold wash cloth bimanually, with increased effort, grunting throughout. Min A provided to facilitate forward shoulder flexion to apply deodorant B. Min A provided to stand with RW. Pt completed anterior peri cleansing, and required min A to reach posteriorly to clean thoroughly. Pt required mod A to don underwear sitting EOB, with increased fatigue observed this session and SOB. Pt completed familiar grooming task at sink in standing, with set up and CGA. Pt was brought down to IADL suite where he completed 5 steps with RW and min A. Manual facilitation provided to position RW at counter. Pt used R and L UE to pick up food items and place into overhead counter, with min manual facilitation for shoulder flexion. Pt required rest break following d/t SOB. Pt was returned to room and left sitting up in w/c with chair alarm belt fastened and activated and all needs within reach.    Therapy Documentation Precautions:  Precautions Precautions: Fall, Cervical Precaution Booklet Issued: No Precaution Comments: reviewed cervical precautions Required Braces or  Orthoses: Cervical Brace Cervical Brace: Soft collar, At all times Restrictions Weight Bearing Restrictions: No   Vital Signs: Therapy Vitals Pulse Rate: 62 BP: 114/65 Pain: Pain Assessment Pain Scale: Faces Faces Pain Scale: Hurts a little bit Pain Type: Surgical pain Pain Location: Neck Pain Orientation: Posterior;Mid Pain Descriptors / Indicators: Aching Pain Onset: On-going Pain Intervention(s): Repositioned;Ambulation/increased activity ADL: ADL Equipment Provided: Feeding equipment, Other (comment)(built up handle) Eating: Moderate cueing, Minimal assistance Where Assessed-Eating: Wheelchair Grooming: Minimal assistance, Minimal cueing Where Assessed-Grooming: Sitting at sink Upper Body Bathing: Moderate cueing, Minimal assistance Where Assessed-Upper Body Bathing: Sitting at sink Lower Body Bathing: Moderate cueing, Minimal assistance Where Assessed-Lower Body Bathing: Wheelchair Upper Body Dressing: Minimal assistance Where Assessed-Upper Body Dressing: Wheelchair Lower Body Dressing: Moderate assistance Where Assessed-Lower Body Dressing: Wheelchair Toileting: Minimal assistance, Moderate cueing Where Assessed-Toileting: Teacher, adult education: Minimal assistance, Moderate verbal cueing Toilet Transfer Method: Proofreader: Raised toilet seat  See Function Navigator for Current Functional Status.   Therapy/Group: Individual Therapy  Crissie Reese 06/23/2018, 12:10 PM

## 2018-06-24 ENCOUNTER — Inpatient Hospital Stay (HOSPITAL_COMMUNITY): Payer: BLUE CROSS/BLUE SHIELD | Admitting: Physical Therapy

## 2018-06-24 ENCOUNTER — Inpatient Hospital Stay (HOSPITAL_COMMUNITY): Payer: BLUE CROSS/BLUE SHIELD | Admitting: Speech Pathology

## 2018-06-24 ENCOUNTER — Other Ambulatory Visit: Payer: Self-pay

## 2018-06-24 ENCOUNTER — Inpatient Hospital Stay (HOSPITAL_COMMUNITY): Payer: BLUE CROSS/BLUE SHIELD

## 2018-06-24 LAB — URINE CULTURE: Culture: NO GROWTH

## 2018-06-24 NOTE — Progress Notes (Signed)
Physical Therapy Session Note  Patient Details  Name: Ian Ramos MRN: [id number] Date of Birth: 12/13/1945  Today's Date: 06/24/2018 PT Individual Time: 3143-8887 PT Individual Time Calculation (min): 30 min   Short Term Goals: Week 1:  PT Short Term Goal 1 (Week 1): Pt will perform bed mobility consistent minA PT Short Term Goal 2 (Week 1): Pt will perform transfers consistent minA PT Short Term Goal 3 (Week 1): Pt will ambulate 41' minA with LRAD  Skilled Therapeutic Interventions/Progress Updates:    pt performs gait with RW and close supervision 2 x 25'.  Stair negotiation x 4 steps with min/mod A with bilat handrails.  Gait with obstacle negotiation with RW and supervision with 1 LOB requiring mod A to correct. Pt more fatigued with obstacle negotiation.  Standing horseshoe toss with improving UE control and grip, supervision for balance. Pt left in w/c with needs at hand, alarm set.  Therapy Documentation Precautions:  Precautions Precautions: Fall, Cervical Precaution Booklet Issued: No Precaution Comments: reviewed cervical precautions Required Braces or Orthoses: Cervical Brace Cervical Brace: Soft collar, At all times Restrictions Weight Bearing Restrictions: No Pain: Pain Assessment Pain Scale: 0-10 Pain Score: 0-No pain Faces Pain Scale: No hurt    Therapy/Group: Individual Therapy  Octivia Canion 06/24/2018, 10:16 AM

## 2018-06-24 NOTE — Progress Notes (Signed)
Speech Language Pathology Daily Session Note  Patient Details  Name: Ian Ramos MRN: [id number] Date of Birth: 04/03/1946  Today's Date: 06/24/2018  Session 1: SLP Individual Time: 1050-1130 SLP Individual Time Calculation (min): 40 min   Session 1: SLP Individual Time: 1355-1415 SLP Individual Time Calculation (min): 20 min Missed Time: 10 minutes, declined  Short Term Goals: Week 1: SLP Short Term Goal 1 (Week 1): Patient will consume single, small sips of thin liquids via straw with minimal overt s/s of aspiration with Min A verbal cues over 2 sessions prior to upgrade.  SLP Short Term Goal 2 (Week 1): Patient will consume trials of Dys. 3 texutres with efficient mastication and complete oral clearance without overt s/s of aspiration over 2 sessions with Min A verbal cues prior to upgrade.  SLP Short Term Goal 3 (Week 1): Patient will demonstrate sustained attention to functional tasks for 5 minutes with Mod A verbal cues for redirection.  SLP Short Term Goal 4 (Week 1): Patient will demonstrate functional problem solving for basic and familiar tasks with Mod A verbal cues.  SLP Short Term Goal 5 (Week 1): Patient will utilize external aids for orientation to place, time and situation with Max A multimodal cues.  SLP Short Term Goal 6 (Week 1): Patient will self-monitor and correc verbal errors during structured language tasks with Max A multimodal cues.   Skilled Therapeutic Interventions:  Session 1: Skilled treatment session focused on cognitive-linguistic goals. SLP facilitated with continued diagnostic treatment in regards to language skills.  Patient named functional items with 50% accuracy and Max A multimodal cues with verbal perseveration noted. Patient appeared to demonstrate some emergent awareness of difficulty but required total A to self-correct. Patient completed an oral reading task at the phrase level with 50% accuracy and Max A multimodal cues and required total A  for orientation throughout session. STGs adjusted to reflect current language deficits. Patient left upright in wheelchair with alarm on and all needs within reach. Continue with current plan of care.   Session 2:  Skilled treatment session focused on dysphagia goals. SLP facilitated session by administering oral care via the suction toothbrush. Patient consumed trials of thin liquids via straw without overt s/s of aspiration but required Max encouragement for participation. Suspect, due to lack of understanding of deficits. Patient declined further trials, therefore, session ended 10 minutes early. Patient left upright in wheelchair with alarm on and all needs within reach. Continue with current plan of care.      Function:   Cognition Comprehension Comprehension assist level: Understands basic 50 - 74% of the time/ requires cueing 25 - 49% of the time  Expression   Expression assist level: Expresses basic 50 - 74% of the time/requires cueing 25 - 49% of the time. Needs to repeat parts of sentences.  Social Interaction Social Interaction assist level: Interacts appropriately 50 - 74% of the time - May be physically or verbally inappropriate.  Problem Solving Problem solving assist level: Solves basic 25 - 49% of the time - needs direction more than half the time to initiate, plan or complete simple activities  Memory Memory assist level: Recognizes or recalls 25 - 49% of the time/requires cueing 50 - 75% of the time    Pain No/Denies Pain   Therapy/Group: Individual Therapy  Tyqwan Pink 06/24/2018, 3:15 PM

## 2018-06-24 NOTE — Progress Notes (Signed)
Physical Therapy Session Note  Patient Details  Name: Ian Ramos MRN: [id number] Date of Birth: [birth year]  Today's Date: 06/24/2018 PT Individual Time: 1515-1555 PT Individual Time Calculation (min): 40 min   Short Term Goals: Week 1:  PT Short Term Goal 1 (Week 1): Pt will perform bed mobility consistent minA PT Short Term Goal 2 (Week 1): Pt will perform transfers consistent minA PT Short Term Goal 3 (Week 1): Pt will ambulate 93' minA with LRAD  Skilled Therapeutic Interventions/Progress Updates:   Pt in w/c and agreeable to therapy, no c/o pain. Session focused on overall activity tolerance, balance, and cognitive remediation. Pt self-propelled w/c to therapy gym to work on UE coordination, mod-max assist to push w/ pt providing 10-20% propulsion w/ BUEs. Max verbal, visual, and manual cues for technique, however pt unable to coordinate movements functionally. Worked on standing tolerance and functional balance w/o UE support while performing bimanual cognitive task. Worked on putting pipe tree together, mod-max verbal and visual cues to attend to task, sequence and motor plan task, and to utilize [place]. Returned to room via w/c, pt self-propelled ~50' w/ max assist. Ended session in w/c, call bell in reach and all needs met.   Therapy Documentation Precautions:  Precautions Precautions: Fall, Cervical Precaution Booklet Issued: No Precaution Comments: reviewed cervical precautions Required Braces or Orthoses: Cervical Brace Cervical Brace: Soft collar, At all times Restrictions Weight Bearing Restrictions: No Vital Signs: Therapy Vitals Pulse Rate: 65 Resp: 16 BP: 101/66 Patient Position (if appropriate): Sitting Oxygen Therapy SpO2: 97 % O2 Device: Room Air  See Function Navigator for Current Functional Status.   Therapy/Group: Individual Therapy  Ian Ramos Clent Demark 06/24/2018, 3:56 PM

## 2018-06-24 NOTE — Progress Notes (Signed)
Occupational Therapy Session Note  Patient Details  Name: Ian Ramos MRN: [id number] Date of Birth: [birth year]  Today's Date: 06/24/2018 OT Individual Time: 4917-9150 OT Individual Time Calculation (min): 65 min    Short Term Goals: Week 1:  OT Short Term Goal 1 (Week 1): Pt will don shirt with (S) OT Short Term Goal 2 (Week 1): Pt will scoop food with LRD and CGA OT Short Term Goal 3 (Week 1): Pt will maintain attention to simple grooming task with min cueing OT Short Term Goal 4 (Week 1): Pt will transfer to toilet with CGA  Skilled Therapeutic Interventions/Progress Updates:    Pt supine in bed agreeable to therapy with no c/o pain. Pt reported not knowing if brief was clean or not, when checked brief and bed were saturated with urine. Pt completed bed mobility R and L with CGA for brief change. Pt used RW to transfer to recliner with CGA. Pt donned pants sitting in recliner with increased time and min A to pull up over hips. Pt was give time to problem solve through donning socks, demo and tactile cues provided to try figure 4 technique, with mod A overall required and pt unable to sequence pulling up sock in any position other than heavy forward flexion. Pt threaded R sleeve through button up, required manual A to pull around back and then to fasten buttons. Pt was set up to eat breakfast, with built up handle applied to utensils and containers opened. With positioning of container and occasional assistance spearing food, pt able to bring hand to mouth consistently and with 90% accuracy. Red spot noticed on lateral aspect of pt's R foot, RN alerted. Pt left sitting up in w/c with chair alarm belt set and all needs met.   Therapy Documentation Precautions:  Precautions Precautions: Fall, Cervical Precaution Booklet Issued: No Precaution Comments: reviewed cervical precautions Required Braces or Orthoses: Cervical Brace Cervical Brace: Soft collar, At all  times Restrictions Weight Bearing Restrictions: No Vital Signs:  Pain: Pain Assessment Pain Scale: Faces Faces Pain Scale: No hurt ADL: ADL Equipment Provided: Feeding equipment, Other (comment)(built up handle) Eating: Moderate cueing, Minimal assistance Where Assessed-Eating: Wheelchair Grooming: Minimal assistance, Minimal cueing Where Assessed-Grooming: Sitting at sink Upper Body Bathing: Moderate cueing, Minimal assistance Where Assessed-Upper Body Bathing: Sitting at sink Lower Body Bathing: Moderate cueing, Minimal assistance Where Assessed-Lower Body Bathing: Wheelchair Upper Body Dressing: Minimal assistance Where Assessed-Upper Body Dressing: Wheelchair Lower Body Dressing: Moderate assistance Where Assessed-Lower Body Dressing: Wheelchair Toileting: Minimal assistance, Moderate cueing Where Assessed-Toileting: Glass blower/designer: Minimal assistance, Moderate verbal cueing Toilet Transfer Method: Counselling psychologist: Raised toilet seat  See Function Navigator for Current Functional Status.   Therapy/Group: Individual Therapy  Curtis Sites 06/24/2018, 8:39 AM

## 2018-06-24 NOTE — Progress Notes (Signed)
[place] PHYSICAL MEDICINE & REHABILITATION     PROGRESS NOTE    Subjective/Complaints:  Up in chair. No new complaints.   ROS: Patient denies fever, rash, sore throat, blurred vision, nausea, vomiting, diarrhea, cough, shortness of breath or chest pain, joint or back pain, headache, or mood change.    Objective:  No results found. Recent Labs    06/23/18 0701  WBC 6.6  HGB 8.5*  HCT 26.1*  PLT 272   Recent Labs    06/23/18 0701  NA 136  K 4.2  CL 104  GLUCOSE 107*  BUN 20  CREATININE 1.32*  CALCIUM 8.3*   CBG (last 3)  Recent Labs    06/23/18 1133 06/23/18 1708  GLUCAP 130* 97    Wt Readings from Last 3 Encounters:  06/24/18 72.2 kg  06/19/18 74.8 kg  05/23/18 79.4 kg     Intake/Output Summary (Last 24 hours) at 06/24/2018 0831 Last data filed at 06/23/2018 1847 Gross per 24 hour  Intake 210 ml  Output -  Net 210 ml    Vital Signs: Blood pressure 125/66, pulse 61, temperature (!) 97.5 F (36.4 C), temperature source Oral, resp. rate 18, height 5' 7.5" (1.715 m), weight 72.2 kg, SpO2 98 %. Physical Exam:  Constitutional: No distress . Vital signs reviewed. HEENT: EOMI, oral membranes moist Neck: supple Cardiovascular: RRR without murmur. No JVD    Respiratory: CTA Bilaterally without wheezes or rales. Normal effort    GI: BS +, non-tender, non-distended   Neurological: alert to self, hospital Motor strength is 2 to 2+/5 both deltoids (rR>L) 3/5 both biceps 3/5 bilateral finger flexors and extensors, HI.  4/5 bilateral hip flexor knee extensor ankle dorsiflexors.--stable Decreased LT in bilateral hands. Psych: pleasant and generally cooperative Skin: surgical site CDI   Assessment/Plan: 1. Functional and mobility deficits secondary to cervical myelopath/ACDF complicated by right MCA/PCA watershed infarct which require 3+ hours per day of interdisciplinary therapy in a comprehensive inpatient rehab setting. Physiatrist is providing close  team supervision and 24 hour management of active medical problems listed below. Physiatrist and rehab team continue to assess barriers to discharge/monitor patient progress toward functional and medical goals.  Function:  Bathing Bathing position   Position: Wheelchair/chair at sink  Bathing parts Body parts bathed by patient: Right arm, Left arm, Chest, Abdomen, Front perineal area, Buttocks, Right upper leg, Left upper leg Body parts bathed by helper: Right lower leg, Left lower leg, Back  Bathing assist Assist Level: Touching or steadying assistance(Pt > 75%)      Upper Body Dressing/Undressing Upper body dressing   What is the patient wearing?: Hospital gown                Upper body assist Assist Level: Touching or steadying assistance(Pt > 75%)      Lower Body Dressing/Undressing Lower body dressing   What is the patient wearing?: Underwear, Non-skid slipper socks Underwear - Performed by patient: Thread/unthread right underwear leg, Pull underwear up/down Underwear - Performed by helper: Thread/unthread left underwear leg                          Lower body assist Assist for lower body dressing: Touching or steadying assistance (Pt > 75%)      Toileting Toileting   Toileting steps completed by patient: Adjust clothing after toileting Toileting steps completed by helper: Adjust clothing prior to toileting, Performs perineal hygiene, Adjust clothing after toileting Toileting Assistive Devices:  Grab bar or rail  Toileting assist Assist level: Touching or steadying assistance (Pt.75%)   Transfers Chair/bed transfer   Chair/bed transfer method: Stand pivot Chair/bed transfer assist level: Touching or steadying assistance (Pt > 75%) Chair/bed transfer assistive device: Armrests, Patent attorney     Max distance: 35' Assist level: Touching or steadying assistance (Pt > 75%)   Wheelchair          Cognition Comprehension  Comprehension assist level: Understands basic 50 - 74% of the time/ requires cueing 25 - 49% of the time  Expression Expression assist level: Expresses basic 75 - 89% of the time/requires cueing 10 - 24% of the time. Needs helper to occlude trach/needs to repeat words.  Social Interaction Social Interaction assist level: Interacts appropriately 50 - 74% of the time - May be physically or verbally inappropriate.  Problem Solving Problem solving assist level: Solves basic 50 - 74% of the time/requires cueing 25 - 49% of the time  Memory Memory assist level: Recognizes or recalls 50 - 74% of the time/requires cueing 25 - 49% of the time   Medical Problem List and Plan: 1.Decreased functional mobilitysecondary to cervical stenosis with myelopathy status post surgical decompression and fusion 05/23/2018 complicated by right MCA/PCA watershed and right parietal punctate infarcts after cardiac catheterization 06/05/2018  -CIR PT, OT  -team conference today 2. DVT Prophylaxis/Anticoagulation: Subcutaneous Lovenox.  3. Pain Management:Tylenol as needed 4. Mood:Provide emotional support 5. Neuropsych: This patientis not consistentlycapable of making decisions on hisown behalf.  -ongoing confusion  -UA neg, UCX pending. Labs reviewed, unremarkable 6. Skin/Wound Care:Routine skin checks 7. Fluids/Electrolytes/Nutrition:encourage PO fluids  -   8.CAD with cardiomyopathy. Medical management. Continue aspirin and Plavix 9.ICA stenosis. Follow-up vascular surgery/ await plan procedure for carotid stenting. Likely as outpt 10.CKD stage III. Last creat 1.2- stable 11.Hypertension. Coreg 3.125 mg twice daily, clonidine patch 0.1 mg weekly Vitals:   06/23/18 1937 06/24/18 0424  BP: 112/68 125/66  Pulse: 62 61  Resp: 17 18  Temp: 97.9 F (36.6 C) (!) 97.5 F (36.4 C)  SpO2: 97% 98%  controlled 9/24 12.Esophagitis. Pepcid 13.Dysphagia. Dysphasia #2 honey thick liquids. Monitor  hydration, I:   -push fluids, labs ok 14. Acute on chronic anemia: hgb     -8.5 9/23   LOS (Days) 5 A FACE TO FACE EVALUATION WAS PERFORMED  Ranelle Oyster, MD 06/24/2018 8:31 AM

## 2018-06-24 NOTE — Progress Notes (Signed)
Ian Ramos, OT alerted writer of reddened area on R lateral foot. Assessed, area light red, no warmth or pain noted when area touched. Edema noted in BLE, asked pt to wear TED hose and pt did not want to put on at this time. Will cont to monitor. Also will notify PA to assess.  Ross Ludwig, LPN

## 2018-06-25 ENCOUNTER — Inpatient Hospital Stay (HOSPITAL_COMMUNITY): Payer: BLUE CROSS/BLUE SHIELD | Admitting: Physical Therapy

## 2018-06-25 ENCOUNTER — Inpatient Hospital Stay (HOSPITAL_COMMUNITY): Payer: BLUE CROSS/BLUE SHIELD | Admitting: Speech Pathology

## 2018-06-25 ENCOUNTER — Inpatient Hospital Stay (HOSPITAL_COMMUNITY): Payer: BLUE CROSS/BLUE SHIELD

## 2018-06-25 DIAGNOSIS — I6521 Occlusion and stenosis of right carotid artery: Secondary | ICD-10-CM

## 2018-06-25 NOTE — Progress Notes (Signed)
Physical Therapy Session Note  Patient Details  Name: Ian Ramos MRN: [id number] Date of Birth: [birth year]  Today's Date: 06/25/2018 PT Individual Time: 1312-1422 PT Individual Time Calculation (min): 70 min   Short Term Goals: Week 1:  PT Short Term Goal 1 (Week 1): Pt will perform bed mobility consistent minA PT Short Term Goal 2 (Week 1): Pt will perform transfers consistent minA PT Short Term Goal 3 (Week 1): Pt will ambulate 69' minA with LRAD  Skilled Therapeutic Interventions/Progress Updates:  Pt received in bed & required max encouragement for participation in therapy and manual facilitation for BLE to EOB to initiate supine>sitting EOB but then pt able to pull to upright sitting with bed rail. Pt ambulates to w/c with RW & min assist. Transported pt to gym via w/c total assist for time management. Gait x 100 ft + 100 ft with RW & min assist with pt demonstrating decreased gait speed. Pt stood on compliant surface with fluctuating BUE<>1 UE<>no UE support with close supervision<>min assist for balance while engaging in peg board activity and focusing on standing balance. Pt required MAX cuing & significantly extra time for error correct & problem solving to arrange moderately complex design from pre selected pieces. Pt with impaired FMC RUE as pt frequently dropped pegs. Pt completed car transfer at SUV simulated height (pt reports he has a Dentist) with min assist and extra time to place LLE in car. Pt utilized cybex kinetron from sitting with task focusing on BLE strengthening & NMR. At end of session pt left sitting in w/c with chair alarm donned, call bell in reach & fiance present in room. Educated pt's fiance on need to call for nursing staff if pt is hungry/thirsty with her reporting understanding.   No c/o pain reported during session.   Therapy Documentation Precautions:  Precautions Precautions: Fall, Cervical Precaution Booklet Issued: No Precaution Comments:  reviewed cervical precautions Required Braces or Orthoses: Cervical Brace Cervical Brace: Soft collar, At all times Restrictions Weight Bearing Restrictions: No    See Function Navigator for Current Functional Status.   Therapy/Group: Individual Therapy  Sandi Mariscal 06/25/2018, 2:22 PM

## 2018-06-25 NOTE — H&P (View-Only) (Signed)
[facility]  Subjective  - feels a little stronger   Objective (!) 144/70 60 98 F (36.7 C) (Oral) 18 100%  Intake/Output Summary (Last 24 hours) at 06/25/2018 0907 Last data filed at 06/24/2018 1723 Gross per 24 hour  Intake 240 ml  Output -  Net 240 ml   Neuro follows commands moves all extremites left arm range of motion limited by joint problems  Assessment/Planning: Continue plavix asa statin Plan for right carotid stent on Friday if remains stable Will need 1 night stay post procedure on 4E  Halil Rentz 06/25/2018 9:07 AM --  Laboratory Lab Results: Recent Labs    06/23/18 0701  WBC 6.6  HGB 8.5*  HCT 26.1*  PLT 272   BMET Recent Labs    06/23/18 0701  NA 136  K 4.2  CL 104  CO2 22  GLUCOSE 107*  BUN 20  CREATININE 1.32*  CALCIUM 8.3*    COAG No results found for: INR, PROTIME No results found for: PTT     

## 2018-06-25 NOTE — Progress Notes (Signed)
[place] PHYSICAL MEDICINE & REHABILITATION     PROGRESS NOTE    Subjective/Complaints: In bed, sleep was inconsistent.   ROS: Limited due to cognitive/behavioral   Objective:  No results found. Recent Labs    06/23/18 0701  WBC 6.6  HGB 8.5*  HCT 26.1*  PLT 272   Recent Labs    06/23/18 0701  NA 136  K 4.2  CL 104  GLUCOSE 107*  BUN 20  CREATININE 1.32*  CALCIUM 8.3*   CBG (last 3)  Recent Labs    06/23/18 1133 06/23/18 1708  GLUCAP 130* 97    Wt Readings from Last 3 Encounters:  06/25/18 72 kg  06/19/18 74.8 kg  05/23/18 79.4 kg     Intake/Output Summary (Last 24 hours) at 06/25/2018 0847 Last data filed at 06/24/2018 1723 Gross per 24 hour  Intake 240 ml  Output -  Net 240 ml    Vital Signs: Blood pressure 140/65, pulse 64, temperature 98 F (36.7 C), temperature source Oral, resp. rate 18, height 5' 7.5" (1.715 m), weight 72 kg, SpO2 100 %. Physical Exam:  Constitutional: No distress . Vital signs reviewed. HEENT: EOMI, oral membranes moist Neck: supple Cardiovascular: RRR without murmur. No JVD    Respiratory: CTA Bilaterally without wheezes or rales. Normal effort    GI: BS +, non-tender, non-distended  Neurological: alert to self, follows basic commands Motor strength is 2 to 2+/5 both deltoids (rR>L) 3/5 both biceps 3/5 bilateral finger flexors and extensors, HI.  4/5 bilateral hip flexor knee extensor ankle dorsiflexors.  Decreased LT in bilateral hands. Psych: pleasant and generally cooperative Skin: surgical site remains CDI   Assessment/Plan: 1. Functional and mobility deficits secondary to cervical myelopath/ACDF complicated by right MCA/PCA watershed infarct which require 3+ hours per day of interdisciplinary therapy in a comprehensive inpatient rehab setting. Physiatrist is providing close team supervision and 24 hour management of active medical problems listed below. Physiatrist and rehab team continue to assess barriers to  discharge/monitor patient progress toward functional and medical goals.  Function:  Bathing Bathing position   Position: Sitting EOB  Bathing parts Body parts bathed by patient: Right arm, Left arm, Chest, Abdomen, Front perineal area, Buttocks, Right upper leg, Left upper leg Body parts bathed by helper: Right lower leg, Left lower leg, Back  Bathing assist Assist Level: Touching or steadying assistance(Pt > 75%)      Upper Body Dressing/Undressing Upper body dressing   What is the patient wearing?: Button up shirt         Button up shirt - Perfomed by patient: Thread/unthread right sleeve, Thread/unthread left sleeve Button up shirt - Perfomed by helper: Pull shirt around back, Button/unbutton shirt    Upper body assist Assist Level: Touching or steadying assistance(Pt > 75%)      Lower Body Dressing/Undressing Lower body dressing   What is the patient wearing?: Pants, Non-skid slipper socks Underwear - Performed by patient: Thread/unthread right underwear leg, Pull underwear up/down Underwear - Performed by helper: Thread/unthread left underwear leg Pants- Performed by patient: Thread/unthread right pants leg, Thread/unthread left pants leg Pants- Performed by helper: Pull pants up/down Non-skid slipper socks- Performed by patient: Don/doff right sock Non-skid slipper socks- Performed by helper: Don/doff left sock                  Lower body assist Assist for lower body dressing: Touching or steadying assistance (Pt > 75%)      Toileting Toileting Toileting activity did  not occur: No continent bowel/bladder event Toileting steps completed by patient: Adjust clothing after toileting Toileting steps completed by helper: Adjust clothing prior to toileting, Performs perineal hygiene, Adjust clothing after toileting Toileting Assistive Devices: Grab bar or rail  Toileting assist Assist level: Touching or steadying assistance (Pt.75%)   Transfers Chair/bed transfer    Chair/bed transfer method: Stand pivot Chair/bed transfer assist level: Touching or steadying assistance (Pt > 75%) Chair/bed transfer assistive device: Armrests, Patent attorney     Max distance: 35' Assist level: Touching or steadying assistance (Pt > 75%)   Wheelchair   Type: Manual Max wheelchair distance: 100' Assist Level: Maximal assistance (Pt 25 - 49%)  Cognition Comprehension Comprehension assist level: Understands basic 50 - 74% of the time/ requires cueing 25 - 49% of the time  Expression Expression assist level: Expresses basic 50 - 74% of the time/requires cueing 25 - 49% of the time. Needs to repeat parts of sentences.  Social Interaction Social Interaction assist level: Interacts appropriately 50 - 74% of the time - May be physically or verbally inappropriate.  Problem Solving Problem solving assist level: Solves basic 25 - 49% of the time - needs direction more than half the time to initiate, plan or complete simple activities  Memory Memory assist level: Recognizes or recalls 25 - 49% of the time/requires cueing 50 - 75% of the time   Medical Problem List and Plan: 1.Decreased functional mobilitysecondary to cervical stenosis with myelopathy status post surgical decompression and fusion 05/23/2018 complicated by right MCA/PCA watershed and right parietal punctate infarcts after cardiac catheterization 06/05/2018  -CIR PT, OT  -ongoing cognitive deficits  2. DVT Prophylaxis/Anticoagulation: Subcutaneous Lovenox.  3. Pain Management:Tylenol as needed 4. Mood:Provide emotional support 5. Neuropsych: This patientis not consistentlycapable of making decisions on hisown behalf.  -ongoing confusion  -UA neg, UCX negative. Labs reviewed, unremarkable 6. Skin/Wound Care:Routine skin checks 7. Fluids/Electrolytes/Nutrition:encourage PO fluids  -   8.CAD with cardiomyopathy. Medical management. Continue aspirin and Plavix 9.ICA stenosis.  Follow-up vascular surgery/ await plan procedure for carotid stenting. Likely as outpt 10.CKD stage III. Last creat 1.2- stable 11.Hypertension. Coreg 3.125 mg twice daily, clonidine patch 0.1 mg weekly Vitals:   06/24/18 1945 06/25/18 0529  BP: 124/61 140/65  Pulse: (!) 58 64  Resp: 15 18  Temp: 97.6 F (36.4 C) 98 F (36.7 C)  SpO2: 96% 100%  controlled 9/25 12.Esophagitis. Pepcid 13.Dysphagia. Dysphasia #2 honey thick liquids. Monitor hydration, I:   -push fluids, labs ok 14. Acute on chronic anemia: hgb     -8.5 9/23   LOS (Days) 6 A FACE TO FACE EVALUATION WAS PERFORMED  Ranelle Oyster, MD 06/25/2018 8:47 AM

## 2018-06-25 NOTE — Progress Notes (Signed)
[facility]  Subjective  - feels a little stronger   Objective (!) 144/70 60 98 F (36.7 C) (Oral) 18 100%  Intake/Output Summary (Last 24 hours) at 06/25/2018 9211 Last data filed at 06/24/2018 1723 Gross per 24 hour  Intake 240 ml  Output -  Net 240 ml   Neuro follows commands moves all extremites left arm range of motion limited by joint problems  Assessment/Planning: Continue plavix asa statin Plan for right carotid stent on Friday if remains stable Will need 1 night stay post procedure on 4E  Fabienne Bruns 06/25/2018 9:07 AM --  Laboratory Lab Results: Recent Labs    06/23/18 0701  WBC 6.6  HGB 8.5*  HCT 26.1*  PLT 272   BMET Recent Labs    06/23/18 0701  NA 136  K 4.2  CL 104  CO2 22  GLUCOSE 107*  BUN 20  CREATININE 1.32*  CALCIUM 8.3*    COAG No results found for: INR, PROTIME No results found for: PTT

## 2018-06-25 NOTE — Patient Care Conference (Signed)
Inpatient RehabilitationTeam Conference and Plan of Care Update Date: 06/24/2018   Time:  2:45 PM   Patient Name: Ian Ramos      Medical Record Number: [id number]  Date of Birth: 09/04/1946 Sex: Male         Room/Bed: 4W11C/4W11C-01 Payor Info: Payor: BLUE CROSS BLUE SHIELD / Plan: BCBS OTHER / Product Type: *No Product type* /    Admitting Diagnosis: CVA Debility Radiculopathy  Admit Date/Time:  06/19/2018  5:05 PM Admission Comments: No comment available   Primary Diagnosis:  <principal problem not specified> Principal Problem: <principal problem not specified>  Patient Active Problem List   Diagnosis Date Noted  . Right middle cerebral artery stroke (HCC) 06/19/2018  . Spondylosis, cervical, with myelopathy   . Neurologic gait disorder   . Essential hypertension   . Acute encephalopathy   . Acute respiratory failure (HCC)   . Acute bilat watershed infarction [facility])   . On mechanically assisted ventilation (HCC)   . Acute systolic CHF (congestive heart failure) (HCC)   . Coronary artery disease due to lipid rich plaque   . DCM (dilated cardiomyopathy) (HCC)   . Intractable pain 05/28/2018  . Chest pain 05/28/2018  . CKD (chronic kidney disease) stage 3, GFR 30-59 ml/min (HCC) 05/28/2018  . Esophageal thickening 05/28/2018  . LBBB (left bundle branch block) 05/28/2018  . Postoperative anemia 05/28/2018  . Status post cervical spinal fusion 05/23/2018  . Cervical myelopathy (HCC) 05/23/2018  . Left arm weakness 09/20/2017  . Brachial plexopathy 09/16/2017  . Need for pneumococcal vaccination 09/16/2017  . Need for influenza vaccination 09/16/2017    Expected Discharge Date: Expected Discharge Date: 07/05/18  Team Members Present: Physician leading conference: Dr. Faith Rogue Social Worker Present: Amada Jupiter, LCSW Nurse Present: Allayne Stack, RN PT Present: Judieth Keens, PT OT Present: Other (comment)(Sandra Earlene Plater, OT) SLP Present: Feliberto Gottron,  SLP PPS Coordinator present : Tora Duck, RN, CRRN     Current Status/Progress Goal Weekly Team Focus  Medical   cervical sci, stroke, ongoing confusion  improve cognition  sleep-wake restoration, bp,nutrition,   Bowel/Bladder   Continent/incontinent of bowel/bladder LBM 06/22/18  To be continent of bowel/bladder with min assist  Timed toileting q2hrs while awake   Swallow/Nutrition/ Hydration   Dys. 2 textures with nectar-thick liquids, Supervision  Supervision  Trials of upgraded liquids    ADL's   flucuates depending on fatigue, can be mod A- (S) with dressing/bathing, min A transfers  (S) with ADLs and transfers  attention, initiation, termination of tasks during b/d, functional activity tolerance, ADL retraining, and transfers   Mobility   min A transfers and gait with RW  supervision overall  awareness, family ed, balance, stairs   Communication   Mod A  Min A  verbal expression of wants/needs, self-awareness of verbal errors    Safety/Cognition/ Behavioral Observations  Max-Total A  Mod A  orientation, attention, recall, problem solving    Pain   No complain of pain   <2  Assess and treat any pain q shift and as needed   Skin   Surgical incision to the back of the neck, OTA  Maintain skin integrity with min assist  Assess skin function q shift and as needed    Rehab Goals Patient on target to meet rehab goals: Yes *See Care Plan and progress notes for long and short-term goals.     Barriers to Discharge  Current Status/Progress Possible Resolutions Date Resolved   Physician  Medical stability;Behavior               Nursing                  PT                    OT                  SLP                SW                Discharge Planning/Teaching Needs:  Home alone with girlfriend and family to coordinate 24/7   Teaching to be scheduled.   Team Discussion:  Medically stable;  incont of b/b and discussed need for scheduled, timed toileting.  Significant  cognitive deficits.  Supervision to min-guard with gait, steps, balance.  UE most imparied mod assist UB b/d.   Total assist with basic cognition, + aphasia, perserveration. D2 nectar.  SW to follow up with caregiver to stress need for 24/7 supervision.  Revisions to Treatment Plan:  None    Continued Need for Acute Rehabilitation Level of Care: The patient requires daily medical management by a physician with specialized training in physical medicine and rehabilitation for the following conditions: Daily direction of a multidisciplinary physical rehabilitation program to ensure safe treatment while eliciting the highest outcome that is of practical value to the patient.: Yes Daily medical management of patient stability for increased activity during participation in an intensive rehabilitation regime.: Yes Daily analysis of laboratory values and/or radiology reports with any subsequent need for medication adjustment of medical intervention for : Neurological problems   I attest that I was present, lead the team conference, and concur with the assessment and plan of the team.   Juliet Vasbinder 06/25/2018, 9:22 AM

## 2018-06-25 NOTE — Progress Notes (Signed)
Speech Language Pathology Daily Session Note  Patient Details  Name: Ian Ramos MRN: [id number] Date of Birth: 1945/11/20  Today's Date: 06/25/2018 SLP Individual Time: 0830-0925 SLP Individual Time Calculation (min): 55 min  Short Term Goals: Week 1: SLP Short Term Goal 1 (Week 1): Patient will consume single, small sips of thin liquids via straw with minimal overt s/s of aspiration with Min A verbal cues over 2 sessions prior to upgrade.  SLP Short Term Goal 2 (Week 1): Patient will consume trials of Dys. 3 texutres with efficient mastication and complete oral clearance without overt s/s of aspiration over 2 sessions with Min A verbal cues prior to upgrade.  SLP Short Term Goal 3 (Week 1): Patient will demonstrate sustained attention to functional tasks for 5 minutes with Mod A verbal cues for redirection.  SLP Short Term Goal 4 (Week 1): Patient will demonstrate functional problem solving for basic and familiar tasks with Mod A verbal cues.  SLP Short Term Goal 5 (Week 1): Patient will utilize external aids for orientation to place, time and situation with Max A multimodal cues.  SLP Short Term Goal 6 (Week 1): Patient will self-monitor and correc verbal errors during structured language tasks with Max A multimodal cues.   Skilled Therapeutic Interventions: Skilled treatment session focused on dysphagia and cognitive goals. Upon arrival, patient was awake while upright in the bed consuming his breakfast. Patient consumed his meal of Dys. 2 textures with nectar-thick liquids with intermittent throat clearing, suspect due to bolus size of liquids.  Patient self-fed his meal with extra time and Min A verbal and tactile cues for managing his utensils and cup. Patient also trialed medications whole in pure without overt s/s of aspiration or difficulty. Recommend patient continue current diet with full supervision and consume medications whole in puree. SLP also facilitated session by providing  Min A verbal cues to sort coins from a field of 4 but required total A for counting the number and amount of coins. Patient also required Max A verbal and visual cues for utilization of external aids for orientation to place and time and required Total A for orientation to situation. Patient left upright in bed with all needs within reach and alarm on. Continue with current plan of care.      Function:  Eating Eating   Modified Consistency Diet: Yes Eating Assist Level: Help managing cup/glass;Supervision or verbal cues;Set up assist for   Eating Set Up Assist For: Opening containers Helper Scoops Food on Utensil: Occasionally     Cognition Comprehension Comprehension assist level: Understands basic 50 - 74% of the time/ requires cueing 25 - 49% of the time  Expression   Expression assist level: Expresses basic 25 - 49% of the time/requires cueing 50 - 75% of the time. Uses single words/gestures.  Social Interaction Social Interaction assist level: Interacts appropriately 50 - 74% of the time - May be physically or verbally inappropriate.  Problem Solving Problem solving assist level: Solves basic 25 - 49% of the time - needs direction more than half the time to initiate, plan or complete simple activities  Memory Memory assist level: Recognizes or recalls 25 - 49% of the time/requires cueing 50 - 75% of the time    Pain No/Denies Pain   Therapy/Group: Individual Therapy  Kian Ottaviano 06/25/2018, 9:29 AM

## 2018-06-25 NOTE — Progress Notes (Signed)
Occupational Therapy Session Note  Patient Details  Name: Ian Ramos MRN: [id number] Date of Birth: 1945-11-26  Today's Date: 06/25/2018 OT Individual Time: 3976-7341 OT Individual Time Calculation (min): 70 min    Short Term Goals: Week 1:  OT Short Term Goal 1 (Week 1): Pt will don shirt with (S) OT Short Term Goal 2 (Week 1): Pt will scoop food with LRD and CGA OT Short Term Goal 3 (Week 1): Pt will maintain attention to simple grooming task with min cueing OT Short Term Goal 4 (Week 1): Pt will transfer to toilet with CGA  Skilled Therapeutic Interventions/Progress Updates:    Pt received sitting up in bed with no c/o pain. Pt used RW to complete functional mobility into bathroom with CGA. Pt completed 3/3 toileting tasks with CGA. Pt transferred into shower and completed UB bathing with CGA while standing. Pt resistant to sitting nor washing anywhere else. Pt returned EOB and completed LB dressing with CGA. Min A required to don button up shirt. Attempted to show pt button hook, but pt uninterested. Pt used RW and completed 100 ft of functional mobility with 1 seated rest break with CGA provided throughout. Pt completed standing level arm bike to increase B UE AROM and strength, for 3 minutes x2 repetitions. Pt returned to room and was left supine in bed with bed alarm set.   Therapy Documentation Precautions:  Precautions Precautions: Fall, Cervical Precaution Booklet Issued: No Precaution Comments: reviewed cervical precautions Required Braces or Orthoses: Cervical Brace Cervical Brace: Soft collar, At all times Restrictions Weight Bearing Restrictions: No Vital Signs: Therapy Vitals Pulse Rate: 60 BP: (!) 144/70 Pain: Pain Assessment Pain Scale: Faces Faces Pain Scale: No hurt ADL: ADL Equipment Provided: Feeding equipment, Other (comment)(built up handle) Eating: Moderate cueing, Minimal assistance Where Assessed-Eating: Wheelchair Grooming: Minimal  assistance, Minimal cueing Where Assessed-Grooming: Sitting at sink Upper Body Bathing: Moderate cueing, Minimal assistance Where Assessed-Upper Body Bathing: Sitting at sink Lower Body Bathing: Moderate cueing, Minimal assistance Where Assessed-Lower Body Bathing: Wheelchair Upper Body Dressing: Minimal assistance Where Assessed-Upper Body Dressing: Wheelchair Lower Body Dressing: Moderate assistance Where Assessed-Lower Body Dressing: Wheelchair Toileting: Minimal assistance, Moderate cueing Where Assessed-Toileting: Teacher, adult education: Minimal assistance, Moderate verbal cueing Toilet Transfer Method: Proofreader: Raised toilet seat  See Function Navigator for Current Functional Status.   Therapy/Group: Individual Therapy  Crissie Reese 06/25/2018, 12:18 PM

## 2018-06-26 ENCOUNTER — Inpatient Hospital Stay (HOSPITAL_COMMUNITY): Payer: BLUE CROSS/BLUE SHIELD

## 2018-06-26 ENCOUNTER — Inpatient Hospital Stay (HOSPITAL_COMMUNITY): Payer: BLUE CROSS/BLUE SHIELD | Admitting: Occupational Therapy

## 2018-06-26 ENCOUNTER — Inpatient Hospital Stay (HOSPITAL_COMMUNITY): Payer: BLUE CROSS/BLUE SHIELD | Admitting: Speech Pathology

## 2018-06-26 ENCOUNTER — Inpatient Hospital Stay (HOSPITAL_COMMUNITY): Payer: BLUE CROSS/BLUE SHIELD | Admitting: Physical Therapy

## 2018-06-26 LAB — CREATININE, SERUM
CREATININE: 1.28 mg/dL — AB (ref 0.61–1.24)
GFR calc Af Amer: >60 mL/min (ref >60)
GFR calc non Af Amer: 54 mL/min — ABNORMAL LOW (ref >60)

## 2018-06-26 NOTE — Discharge Summary (Signed)
NAME: Ian Ramos, Ian Ramos MEDICAL RECORD PR:[id number] ACCOUNT [id number] DATE OF BIRTH:Mar 03, 1946 FACILITY: [facility] LOCATION: [facility] PHYSICIAN:ZACHARY Riley Kill, MD  DISCHARGE SUMMARY  DATE OF DISCHARGE:  06/27/2018  DISCHARGE DIAGNOSES: 1.  Cervical stenosis with myelopathy, status post decompression and fusion complicated by right middle cerebral artery, posterior cerebral artery watershed and right parietal punctate infarction after cardiac catheterization 06/05/2018. 2.  Subcutaneous Lovenox for deep venous thrombosis prophylaxis. 3.  Coronary artery disease with cardiomyopathy. 4.  Internal carotid artery stenosis. 5.  Chronic kidney disease stage III. 6.  Hypertension. 7.  Dysphagia. 8.  Acute on chronic anemia.  HOSPITAL COURSE:  This is a 72 year old right-handed male with history of CKD stage III, cervical stenosis, myelopathy with decompression and fusion on 05/23/2018 by Dr. Jordan Likes.  Discharged to home 05/25/2018, ambulating supervision rolling walker.  He had  been using a rolling walker since recent surgery.  Readmitted 05/28/2018 with chest pain, left shoulder pain.  Troponin negative.  Mildly elevated creatinine 1.39.  Chest x-ray negative.  Neurosurgery followup.  Felt chest and shoulder related to  persistent C5 radiculopathy.  CT cervical spine showed no fracture, deformity or malalignment.  CT angiogram of the chest showed some potential esophagitis.  Esophagram was negative.  Echocardiogram:  Ejection fraction 40%, diffuse hypokinesis, grade I  diastolic dysfunction.  EKG:  Left bundle branch block, no ischemic changes.  Cardiology followup.  Myoview study completed 06/04/2018 with findings consistent with ischemia.  Underwent cardiac catheterization 06/05/2018 showing an occluded  acute  marginal 75% proximal LAD.  Medical management advised.  Cleared to use aspirin and Plavix.  Postoperative catheterization lethargy, altered mental status.  Rapid response called.  MRI CT  showed subcentimeter acute early subacute infarctions, 2 in the  left superior frontal lobe, 2 in the right posterior lateral parietal lobe.  MRA with minimal flow.  Related signal in the left cervical ICA.  Absence of flow-related signal within the petrous and cavernous segments of the left ICA.  Neurology followup.   Carotid Doppler showed right ICA 80-90% stenosis and left ICA occluded.  On 06/09/2018, patient found unresponsive in his chair.  Blood pressure systolic in the 40s, heart rate in the 50s.  A code was called.  Received atropine.  He was intubated.   Vascular study.  Vascular surgery consulted for severe ICA stenosis tentatively planning for transfemoral carotid stent.  He was on a dysphagia #2 honey thick liquid diet.  The patient was admitted for a comprehensive rehab program.  PAST MEDICAL HISTORY:  See discharge diagnoses.  SOCIAL HISTORY:  Lives with his sister and supportive family.  FUNCTIONAL STATUS:  Upon admission to rehab services was +2 physical assist sit to stand, max assist stand-pivot transfers, ambulating mod assist 4 feet.  PHYSICAL EXAMINATION: VITAL SIGNS:  Blood pressure 136/56, pulse 70, temperature 98, respirations 18. GENERAL:  Alert male, followed commands. HEENT:  EOMs intact. NECK:  Supple, nontender, no JVD. CARDIOVASCULAR:  Rate controlled. ABDOMEN:  Soft, nontender, good bowel sounds. LUNGS:  Clear to auscultation without wheeze.  REHABILITATION HOSPITAL COURSE:  The patient was admitted to inpatient rehab services.  Therapies initiated on a 3-hour daily basis, consisting of physical therapy, occupational therapy, speech therapy and rehabilitation nursing.  The following issues  were addressed during patient's rehabilitation stay.  Pertaining to the patient's cervical stenosis and myelopathy, he had undergone decompression and fusion 08/23.  Followed by Dr. Jordan Likes.  His course was complicated by right MCA, PCA watershed infarction  after cardiac  catheterization.  Medical  management by cardiology services.  Findings of ICA stenosis.  Follow up per Vascular surgery for plan of right carotid stenting to be completed 06/27/2018.  His blood pressures remain well controlled on present  regimen.  Subcutaneous Lovenox for DVT prophylaxis.  Currently on a dysphagia #2 nectar thick liquid diet.  The patient was receiving ongoing therapies as documented.  He was ambulating 100 feet rolling walker, minimal assistance, decreased gait.  He  stood on compliant surfaces, minimal assist for overall balance.  Completed car transfers, SUV simulated height, minimal assist.  He can gather simple belongings for activities of daily living and homemaking.  In light of need for ICA stenting, he was  discharged to acute care services 06/27/2018.  All medication changes made as per vascular surgery.  He will continue to be followed by rehab services.  LN/NUANCE D:06/26/2018 T:06/26/2018 JOB:[id number]

## 2018-06-26 NOTE — Progress Notes (Addendum)
Physical Therapy Session Note  Patient Details  Name: Ian Ramos MRN: [id number] Date of Birth: 09-Aug-1946  Today's Date: 06/26/2018 PT Individual Time: 0930-1030 PT Individual Time Calculation (min): 60 min   Short Term Goals: Week 1:  PT Short Term Goal 1 (Week 1): Pt will perform bed mobility consistent minA PT Short Term Goal 2 (Week 1): Pt will perform transfers consistent minA PT Short Term Goal 3 (Week 1): Pt will ambulate 31' minA with LRAD  Skilled Therapeutic Interventions/Progress Updates:    pt in w/c awake and alert, states "I reckon" when asked if he is ready for therapy.  Pt performs gait 150' x 3, 200' with RW and close supervision, min A for 1 LOB with pt crossing LEs over each other but with improving balance reactions.  Stair negotiation with pt requiring total cues to not take RW up the steps. Min A for balance.  Pt performs nut/bolt task with largest size bolts with max cuing for sequencing and problem solving.  Seated clothes pin task for UE strengthening with min cues.  nustep x 5 minutes level 4 for LE/UE strengthening with mod cues to continues task.  Pt then refuses to perform further activities despite being given choices of 3 different tasks. Pt missed final 15 minutes of PT due to refusal. Pt to go off unit for procedure tomorrow per MD and PA.  Therapy Documentation Precautions:  Precautions Precautions: Fall, Cervical Precaution Booklet Issued: No Precaution Comments: reviewed cervical precautions Required Braces or Orthoses: Cervical Brace Cervical Brace: Soft collar, At all times Restrictions Weight Bearing Restrictions: No General: PT Amount of Missed Time (min): 15 Minutes PT Missed Treatment Reason: Patient unwilling to participate Pain:  no c/o pain   Therapy/Group: Individual Therapy  Codie Krogh 06/26/2018, 10:33 AM

## 2018-06-26 NOTE — Progress Notes (Signed)
Speech Language Pathology Note  Patient Details  Name: Ian Ramos MRN: [id number] Date of Birth: Nov 06, 1945 Today's Date: 06/26/2018  Patient will be transferred off unit for procedure tomorrow. Patient has not yet finished CIR program and therefore has not met LTG's. SLP will continue to f/u as indicated/ordered following return to CIR.    Vitaliy Eisenhour 06/26/2018, 3:09 PM

## 2018-06-26 NOTE — Progress Notes (Signed)
Occupational Therapy Session Note  Patient Details  Name: Ian Ramos MRN: [id number] Date of Birth: September 18, 1946  Today's Date: 06/26/2018 OT Individual Time: 2671-2458 OT Individual Time Calculation (min): 55 min    Short Term Goals: Week 1:  OT Short Term Goal 1 (Week 1): Pt will don shirt with (S) OT Short Term Goal 2 (Week 1): Pt will scoop food with LRD and CGA OT Short Term Goal 3 (Week 1): Pt will maintain attention to simple grooming task with min cueing OT Short Term Goal 4 (Week 1): Pt will transfer to toilet with CGA  Skilled Therapeutic Interventions/Progress Updates:    Pt seen for OT session focusing on ADL re-traiing and orientation. Pt asleep in supine upon arrival, easily awoken and agreeable to tx session, denying pain. He was not oriented to place or situation, attempts to re-orient throughout session, however, pt remained disoriented to place and situation throughout session.   He transferred to EOB with min A and multimodal cuing for sequencing and technqiue. He ate breakfast seated EOB. Red buil up gripper applied to utensil. Pt demosntrated decreased functional grasp needed to manipulate drinking cup. He was not receptive to cuing and assist provided by therapist in attempts to normalize movement patterns. He was uable to recall any swallowing techiques instructed by SLP orders. He cont to demosntrate impaired problem solving methods, difficulty problem solving method to open food container with not intially successful with B hands.  He dressed seated EOB, cont to ignore cuing provided by therapist and demonstrating poor safety awareness during functional tasks. Steadying assist required for dynamic sitting balance EOB when leaning forward to thread pants.  With increased time and effort, pt able to use B hands to assist with clothing management. Increased assist required for UB dressing 2/2 managing clothing over soft cervical brace.  Attempted timed toileting  protocol, pt ambulating into bathroom with RW and CGA with cuing for safety and slow rate of speed. Even upon entering bathroom pt cont to refuse attempt at toileting task.  With encouragement, pt willing to stay sitting up in w/c at end of session, left with all needs in reach and chair alarm belt on.   Therapy Documentation Precautions:  Precautions Precautions: Fall, Cervical Precaution Booklet Issued: No Precaution Comments: reviewed cervical precautions Required Braces or Orthoses: Cervical Brace Cervical Brace: Soft collar, At all times Restrictions Weight Bearing Restrictions: No Pain:   No/denies pain  See Function Navigator for Current Functional Status.   Therapy/Group: Individual Therapy  Kalyani Maeda L 06/26/2018, 7:19 AM

## 2018-06-26 NOTE — Progress Notes (Addendum)
Occupational Therapy Session Note  Patient Details  Name: Ian Ramos MRN: [id number] Date of Birth: 07/01/1946  Today's Date: 06/26/2018 OT Individual Time: 1330-1400 OT Individual Time Calculation (min): 30 min    Short Term Goals: Week 1:  OT Short Term Goal 1 (Week 1): Pt will don shirt with (S) OT Short Term Goal 2 (Week 1): Pt will scoop food with LRD and CGA OT Short Term Goal 3 (Week 1): Pt will maintain attention to simple grooming task with min cueing OT Short Term Goal 4 (Week 1): Pt will transfer to toilet with CGA  Skilled Therapeutic Interventions/Progress Updates:    Session focused on functional mobility, B UE AROM and coordination, and functional activity tolerance. Pt with very short frustration tolerance this session, becoming mean to therapist throughout session. Pt also presenting with unspecified visualizations, swatting at "little critters". Pt completed 150 ft x2 of functional mobility with RW at [facility] level. Min cueing for Rw management. Pt completed in hand manipulation and grasp/release of small coins with 75% accuracy. Pt completed card matching activity with demo provided to initiate task and then pt able to continue, using R UE for lateral pinch. Pt returned to room and left sitting up with all needs met.   Pt off unit for procedure tomorrow. Pt has not yet finished CIR program and therefore has not met LTG's. OT will continue to f/u as indicated/ordered following return to CIR.   Therapy Documentation Precautions:  Precautions Precautions: Fall, Cervical Precaution Booklet Issued: No Precaution Comments: reviewed cervical precautions Required Braces or Orthoses: Cervical Brace Cervical Brace: Soft collar, At all times Restrictions Weight Bearing Restrictions: No General: General PT Missed Treatment Reason: Patient unwilling to participate Pain: Pain Assessment Pain Scale: 0-10 Pain Score: 0-No pain ADL: ADL Equipment Provided: Feeding  equipment, Other (comment)(built up handle) Eating: Moderate cueing, Minimal assistance Where Assessed-Eating: Wheelchair Grooming: Minimal assistance, Minimal cueing Where Assessed-Grooming: Sitting at sink Upper Body Bathing: Moderate cueing, Minimal assistance Where Assessed-Upper Body Bathing: Sitting at sink Lower Body Bathing: Moderate cueing, Minimal assistance Where Assessed-Lower Body Bathing: Wheelchair Upper Body Dressing: Minimal assistance Where Assessed-Upper Body Dressing: Wheelchair Lower Body Dressing: Moderate assistance Where Assessed-Lower Body Dressing: Wheelchair Toileting: Minimal assistance, Moderate cueing Where Assessed-Toileting: Glass blower/designer: Minimal assistance, Moderate verbal cueing Toilet Transfer Method: Counselling psychologist: Raised toilet seat  See Function Navigator for Current Functional Status.   Therapy/Group: Individual Therapy  Curtis Sites 06/26/2018, 2:11 PM

## 2018-06-26 NOTE — Progress Notes (Signed)
[facility] PHYSICAL MEDICINE & REHABILITATION     PROGRESS NOTE    Subjective/Complaints: No new complaints. Denies pain.  ROS: Limited due to cognitive/behavioral    Objective:  No results found. No results for input(s): WBC, HGB, HCT, PLT in the last 72 hours. Recent Labs    06/26/18 0643  CREATININE 1.28*   CBG (last 3)  Recent Labs    06/23/18 1133 06/23/18 1708  GLUCAP 130* 97    Wt Readings from Last 3 Encounters:  06/26/18 70 kg  06/19/18 74.8 kg  05/23/18 79.4 kg     Intake/Output Summary (Last 24 hours) at 06/26/2018 1116 Last data filed at 06/26/2018 0839 Gross per 24 hour  Intake 110 ml  Output 50 ml  Net 60 ml    Vital Signs: Blood pressure (!) 110/58, pulse (!) 57, temperature 98.6 F (37 C), resp. rate 16, height 5' 7.5" (1.715 m), weight 70 kg, SpO2 95 %. Physical Exam:  Constitutional: No distress . Vital signs reviewed. HEENT: EOMI, oral membranes moist Neck: supple Cardiovascular: RRR without murmur. No JVD    Respiratory: CTA Bilaterally without wheezes or rales. Normal effort    GI: BS +, non-tender, non-distended  Neurological: alert to self, follows basic commands Motor strength is 2 to 2+/5 both deltoids (rR>L) 3/5 both biceps 3/5 bilateral finger flexors and extensors, HI.  4/5 bilateral hip flexor knee extensor ankle dorsiflexors.  Decreased LT in bilateral hands. Psych: pleasant and generally cooperative Skin: surgical site remains CDI   Assessment/Plan: 1. Functional and mobility deficits secondary to cervical myelopath/ACDF complicated by right MCA/PCA watershed infarct which require 3+ hours per day of interdisciplinary therapy in a comprehensive inpatient rehab setting. Physiatrist is providing close team supervision and 24 hour management of active medical problems listed below. Physiatrist and rehab team continue to assess barriers to discharge/monitor patient progress toward functional and medical  goals.  Function:  Bathing Bathing position   Position: Shower  Bathing parts Body parts bathed by patient: Right arm, Left arm, Chest, Abdomen Body parts bathed by helper: Back  Bathing assist Assist Level: Touching or steadying assistance(Pt > 75%)(CGA)      Upper Body Dressing/Undressing Upper body dressing   What is the patient wearing?: Pull over shirt/dress     Pull over shirt/dress - Perfomed by patient: Thread/unthread right sleeve, Thread/unthread left sleeve, Pull shirt over trunk Pull over shirt/dress - Perfomed by helper: Put head through opening Button up shirt - Perfomed by patient: Thread/unthread right sleeve, Thread/unthread left sleeve Button up shirt - Perfomed by helper: Pull shirt around back, Button/unbutton shirt    Upper body assist Assist Level: Touching or steadying assistance(Pt > 75%)      Lower Body Dressing/Undressing Lower body dressing   What is the patient wearing?: Pants, Shoes Underwear - Performed by patient: Thread/unthread right underwear leg, Pull underwear up/down Underwear - Performed by helper: Thread/unthread left underwear leg Pants- Performed by patient: Thread/unthread right pants leg, Thread/unthread left pants leg, Pull pants up/down Pants- Performed by helper: Pull pants up/down Non-skid slipper socks- Performed by patient: Don/doff right sock Non-skid slipper socks- Performed by helper: Don/doff left sock     Shoes - Performed by patient: Don/doff right shoe, Don/doff left shoe, Fasten right, Fasten left            Lower body assist Assist for lower body dressing: Touching or steadying assistance (Pt > 75%)      Toileting Toileting Toileting activity did not occur: No continent bowel/bladder  event Toileting steps completed by patient: Adjust clothing prior to toileting, Performs perineal hygiene, Adjust clothing after toileting Toileting steps completed by helper: Adjust clothing prior to toileting, Performs perineal  hygiene, Adjust clothing after toileting Toileting Assistive Devices: Grab bar or rail  Toileting assist Assist level: Touching or steadying assistance (Pt.75%)   Transfers Chair/bed transfer   Chair/bed transfer method: Stand pivot Chair/bed transfer assist level: Supervision or verbal cues Chair/bed transfer assistive device: Armrests, Patent attorney     Max distance: 150 Assist level: Touching or steadying assistance (Pt > 75%)   Wheelchair   Type: Manual Max wheelchair distance: 100' Assist Level: Maximal assistance (Pt 25 - 49%)  Cognition Comprehension Comprehension assist level: Understands basic 50 - 74% of the time/ requires cueing 25 - 49% of the time  Expression Expression assist level: Expresses basic 25 - 49% of the time/requires cueing 50 - 75% of the time. Uses single words/gestures.  Social Interaction Social Interaction assist level: Interacts appropriately 25 - 49% of time - Needs frequent redirection.  Problem Solving Problem solving assist level: Solves basic 25 - 49% of the time - needs direction more than half the time to initiate, plan or complete simple activities  Memory Memory assist level: Recognizes or recalls less than 25% of the time/requires cueing greater than 75% of the time   Medical Problem List and Plan: 1.Decreased functional mobilitysecondary to cervical stenosis with myelopathy status post surgical decompression and fusion 05/23/2018 complicated by right MCA/PCA watershed and right parietal punctate infarcts after cardiac catheterization 06/05/2018  -CIR PT, OT  -ongoing cognitive deficits   -dc to acute 9/27 for right carotid stent per [facility] 2. DVT Prophylaxis/Anticoagulation: Subcutaneous Lovenox.  3. Pain Management:Tylenol as needed 4. Mood:Provide emotional support 5. Neuropsych: This patientis not consistentlycapable of making decisions on hisown behalf.  -ongoing confusion  -UA neg, UCX negative. Labs reviewed,  unremarkable 6. Skin/Wound Care:Routine skin checks 7. Fluids/Electrolytes/Nutrition:encourage PO fluids  -   8.CAD with cardiomyopathy. Medical management. Continue aspirin and Plavix 9.ICA stenosis. Follow-up vascular surgery/ await plan procedure for carotid stenting. Likely as outpt 10.CKD stage III. Last creat 1.28- 9/26 stable 11.Hypertension. Coreg 3.125 mg twice daily, clonidine patch 0.1 mg weekly Vitals:   06/26/18 0437 06/26/18 0742  BP: (!) 130/53 (!) 110/58  Pulse: (!) 44 (!) 57  Resp: 16   Temp: 98.6 F (37 C)   SpO2:    controlled 9/26 12.Esophagitis. Pepcid 13.Dysphagia. Dysphasia #2 honey thick liquids. Monitor hydration, I:   -push fluids, labs ok 14. Acute on chronic anemia: hgb     -8.5 9/23   LOS (Days) 7 A FACE TO FACE EVALUATION WAS PERFORMED  Ranelle Oyster, MD 06/26/2018 11:16 AM

## 2018-06-26 NOTE — Progress Notes (Signed)
Social Work Patient ID: Ian Ramos, male   DOB: Sep 29, 1946, 72 y.o.   MRN: [id number]   Have reviewed team conference with pt's gf, sister and daughter.  All aware of targeted d/c date of 10/5 and need for 24/7 supervision/ assistance due to significant, ongoing cognitive deficits.  Stressed to all that they should plan that this could be a long-term care need and plan accordingly.   Also, alerted them that VS planning for stent tomorrow.  All with concerns about the procedure and Deatra Ina, PA has alerted Dr. Darrick Penna team and they are to follow up with family today to discuss.  Family very focused on the procedure and their concerns about that and could not confirm how they plan to cover the 24/7 care after CIR.  Will continue to follow up with them and have asked that they talk with one another to secure a plan.  Amparo Donalson, LCSW

## 2018-06-26 NOTE — Discharge Summary (Signed)
Discharge summary job [id number]

## 2018-06-26 NOTE — Progress Notes (Signed)
Speech Language Pathology Daily Session Note  Patient Details  Name: IBN BROSKY MRN: [id number] Date of Birth: 18-Mar-1946  Today's Date: 06/26/2018 SLP Individual Time: 1300-1325 SLP Individual Time Calculation (min): 25 min  Short Term Goals: Week 1: SLP Short Term Goal 1 (Week 1): Patient will consume single, small sips of thin liquids via straw with minimal overt s/s of aspiration with Min A verbal cues over 2 sessions prior to upgrade.  SLP Short Term Goal 2 (Week 1): Patient will consume trials of Dys. 3 texutres with efficient mastication and complete oral clearance without overt s/s of aspiration over 2 sessions with Min A verbal cues prior to upgrade.  SLP Short Term Goal 3 (Week 1): Patient will demonstrate sustained attention to functional tasks for 5 minutes with Mod A verbal cues for redirection.  SLP Short Term Goal 4 (Week 1): Patient will demonstrate functional problem solving for basic and familiar tasks with Mod A verbal cues.  SLP Short Term Goal 5 (Week 1): Patient will utilize external aids for orientation to place, time and situation with Max A multimodal cues.  SLP Short Term Goal 6 (Week 1): Patient will self-monitor and correc verbal errors during structured language tasks with Max A multimodal cues.   Skilled Therapeutic Interventions: Skilled treatment session focused on cognitive goals. Upon arrival, patient was restless and digging in his pockets for his keys to his truck. SLP attempted to reorient the patient without success. Patient wanted to bring his built up grip and utilized it to write his name with perseverative errors, however, patient unaware despite total A . Patient also required Max A verbal cues for sustained attention to task for ~60 second intervals. Patient left upright in the wheelchair with alarm on and all needs within reach. Continue with current plan of care.      Function:   Cognition Comprehension Comprehension assist level:  Understands basic 50 - 74% of the time/ requires cueing 25 - 49% of the time  Expression   Expression assist level: Expresses basic 25 - 49% of the time/requires cueing 50 - 75% of the time. Uses single words/gestures.  Social Interaction Social Interaction assist level: Interacts appropriately 25 - 49% of time - Needs frequent redirection.  Problem Solving Problem solving assist level: Solves basic 25 - 49% of the time - needs direction more than half the time to initiate, plan or complete simple activities  Memory Memory assist level: Recognizes or recalls less than 25% of the time/requires cueing greater than 75% of the time    Pain Pain Assessment Pain Scale: 0-10 Pain Score: 0-No pain  Therapy/Group: Individual Therapy  Deziree Mokry 06/26/2018, 2:59 PM

## 2018-06-27 ENCOUNTER — Inpatient Hospital Stay (HOSPITAL_COMMUNITY)
Admission: AD | Admit: 2018-06-27 | Discharge: 2018-07-03 | DRG: 035 | Disposition: A | Discharge status: Skilled Nursing Facility | Payer: BLUE CROSS/BLUE SHIELD | Attending: Vascular Surgery | Admitting: Vascular Surgery

## 2018-06-27 ENCOUNTER — Inpatient Hospital Stay (HOSPITAL_COMMUNITY)
Admission: AD | Disposition: A | Discharge status: Skilled Nursing Facility | Payer: Self-pay | Source: Home / Self Care | Attending: Vascular Surgery

## 2018-06-27 DIAGNOSIS — G992 Myelopathy in diseases classified elsewhere: Secondary | ICD-10-CM | POA: Diagnosis present

## 2018-06-27 DIAGNOSIS — I420 Dilated cardiomyopathy: Secondary | ICD-10-CM | POA: Diagnosis present

## 2018-06-27 DIAGNOSIS — R29898 Other symptoms and signs involving the musculoskeletal system: Secondary | ICD-10-CM | POA: Diagnosis not present

## 2018-06-27 DIAGNOSIS — M1990 Unspecified osteoarthritis, unspecified site: Secondary | ICD-10-CM | POA: Diagnosis present

## 2018-06-27 DIAGNOSIS — N183 Chronic kidney disease, stage 3 (moderate): Secondary | ICD-10-CM | POA: Diagnosis present

## 2018-06-27 DIAGNOSIS — R710 Precipitous drop in hematocrit: Secondary | ICD-10-CM | POA: Diagnosis not present

## 2018-06-27 DIAGNOSIS — I2510 Atherosclerotic heart disease of native coronary artery without angina pectoris: Secondary | ICD-10-CM | POA: Diagnosis present

## 2018-06-27 DIAGNOSIS — K224 Dyskinesia of esophagus: Secondary | ICD-10-CM | POA: Diagnosis present

## 2018-06-27 DIAGNOSIS — I6523 Occlusion and stenosis of bilateral carotid arteries: Secondary | ICD-10-CM | POA: Diagnosis present

## 2018-06-27 DIAGNOSIS — Z781 Physical restraint status: Secondary | ICD-10-CM | POA: Diagnosis not present

## 2018-06-27 DIAGNOSIS — M4802 Spinal stenosis, cervical region: Secondary | ICD-10-CM | POA: Diagnosis present

## 2018-06-27 DIAGNOSIS — I6522 Occlusion and stenosis of left carotid artery: Secondary | ICD-10-CM

## 2018-06-27 DIAGNOSIS — Z8673 Personal history of transient ischemic attack (TIA), and cerebral infarction without residual deficits: Secondary | ICD-10-CM | POA: Diagnosis not present

## 2018-06-27 DIAGNOSIS — Z981 Arthrodesis status: Secondary | ICD-10-CM

## 2018-06-27 DIAGNOSIS — I454 Nonspecific intraventricular block: Secondary | ICD-10-CM | POA: Diagnosis present

## 2018-06-27 DIAGNOSIS — R0902 Hypoxemia: Secondary | ICD-10-CM | POA: Diagnosis not present

## 2018-06-27 DIAGNOSIS — G459 Transient cerebral ischemic attack, unspecified: Secondary | ICD-10-CM | POA: Diagnosis not present

## 2018-06-27 DIAGNOSIS — Z7401 Bed confinement status: Secondary | ICD-10-CM | POA: Diagnosis not present

## 2018-06-27 DIAGNOSIS — I6521 Occlusion and stenosis of right carotid artery: Secondary | ICD-10-CM | POA: Diagnosis present

## 2018-06-27 DIAGNOSIS — M2550 Pain in unspecified joint: Secondary | ICD-10-CM | POA: Diagnosis not present

## 2018-06-27 HISTORY — PX: CAROTID PTA/STENT INTERVENTION: CATH118231

## 2018-06-27 LAB — POCT ACTIVATED CLOTTING TIME: ACTIVATED CLOTTING TIME: 384 s

## 2018-06-27 SURGERY — CAROTID PTA/STENT INTERVENTION
Anesthesia: LOCAL

## 2018-06-27 MED ORDER — SODIUM CHLORIDE 0.9% FLUSH
3.0000 mL | Freq: Two times a day (BID) | INTRAVENOUS | Status: DC
  Administered 2018-06-30 – 2018-07-02 (×3): 3 mL via INTRAVENOUS

## 2018-06-27 MED ORDER — SODIUM CHLORIDE 0.9 % IV SOLN
INTRAVENOUS | Status: AC
  Administered 2018-06-27 (×2): via INTRAVENOUS

## 2018-06-27 MED ORDER — OXYCODONE HCL 5 MG PO TABS: 5.0000 mg | ORAL_TABLET | ORAL | Status: DC | PRN

## 2018-06-27 MED ORDER — SODIUM CHLORIDE 0.9 % IV SOLN
INTRAVENOUS | Status: DC | PRN
  Administered 2018-06-27: 1.75 mg/kg/h via INTRAVENOUS

## 2018-06-27 MED ORDER — LIDOCAINE HCL (PF) 1 % IJ SOLN
INTRAMUSCULAR | Status: AC
  Filled 2018-06-27: qty 30

## 2018-06-27 MED ORDER — ACETAMINOPHEN 325 MG PO TABS: 325.0000 mg | ORAL_TABLET | ORAL | Status: DC | PRN

## 2018-06-27 MED ORDER — ATROPINE SULFATE 1 MG/10ML IJ SOSY
PREFILLED_SYRINGE | INTRAMUSCULAR | Status: DC | PRN
  Administered 2018-06-27: 0.5 mg via INTRAVENOUS

## 2018-06-27 MED ORDER — MORPHINE SULFATE (PF) 10 MG/ML IV SOLN: 2.0000 mg | INTRAVENOUS | Status: DC | PRN

## 2018-06-27 MED ORDER — ACETAMINOPHEN 325 MG PO TABS: 650.0000 mg | ORAL_TABLET | ORAL | Status: DC | PRN

## 2018-06-27 MED ORDER — PANTOPRAZOLE SODIUM 40 MG PO TBEC
40.0000 mg | DELAYED_RELEASE_TABLET | Freq: Every day | ORAL | Status: DC
  Administered 2018-06-27 – 2018-07-03 (×7): 40 mg via ORAL
  Filled 2018-06-27 (×7): qty 1

## 2018-06-27 MED ORDER — FAMOTIDINE 20 MG PO TABS
20.0000 mg | ORAL_TABLET | Freq: Every day | ORAL | Status: DC
Start: 2018-06-27 — End: 2018-07-03
  Administered 2018-06-27 – 2018-07-03 (×7): 20 mg via ORAL
  Filled 2018-06-27 (×7): qty 1

## 2018-06-27 MED ORDER — LABETALOL HCL 5 MG/ML IV SOLN: 10.0000 mg | INTRAVENOUS | Status: DC | PRN

## 2018-06-27 MED ORDER — SODIUM CHLORIDE 0.45 % IV SOLN
INTRAVENOUS | Status: DC
  Administered 2018-06-28 – 2018-07-03 (×9): via INTRAVENOUS

## 2018-06-27 MED ORDER — ENOXAPARIN SODIUM 40 MG/0.4ML ~~LOC~~ SOLN: 40.0000 mg | SUBCUTANEOUS | Status: DC

## 2018-06-27 MED ORDER — HEPARIN SODIUM (PORCINE) 1000 UNIT/ML IJ SOLN
INTRAMUSCULAR | Status: AC
  Filled 2018-06-27: qty 1

## 2018-06-27 MED ORDER — ACETAMINOPHEN 325 MG RE SUPP: 325.0000 mg | RECTAL | Status: DC | PRN

## 2018-06-27 MED ORDER — SENNA 8.6 MG PO TABS: 2.0000 | ORAL_TABLET | Freq: Every evening | ORAL | 0 refills | Status: DC | PRN

## 2018-06-27 MED ORDER — POTASSIUM CHLORIDE CRYS ER 20 MEQ PO TBCR: 20.0000 meq | EXTENDED_RELEASE_TABLET | Freq: Every day | ORAL | Status: DC | PRN

## 2018-06-27 MED ORDER — NOREPINEPHRINE 4 MG/250ML-% IV SOLN
INTRAVENOUS | Status: AC
  Filled 2018-06-27: qty 250

## 2018-06-27 MED ORDER — FAMOTIDINE 20 MG PO TABS: 20.0000 mg | ORAL_TABLET | Freq: Every day | ORAL | Status: DC

## 2018-06-27 MED ORDER — METOPROLOL TARTRATE 5 MG/5ML IV SOLN: 2.0000 mg | INTRAVENOUS | Status: DC | PRN

## 2018-06-27 MED ORDER — CEFAZOLIN SODIUM-DEXTROSE 2-4 GM/100ML-% IV SOLN: 2.0000 g | Freq: Three times a day (TID) | INTRAVENOUS | Status: DC

## 2018-06-27 MED ORDER — BIVALIRUDIN BOLUS VIA INFUSION - CUPID
INTRAVENOUS | Status: DC | PRN
  Administered 2018-06-27: 53.4 mg via INTRAVENOUS

## 2018-06-27 MED ORDER — ASPIRIN 81 MG PO CHEW: 162.0000 mg | CHEWABLE_TABLET | Freq: Every day | ORAL | Status: DC

## 2018-06-27 MED ORDER — IODIXANOL 320 MG/ML IV SOLN
INTRAVENOUS | Status: DC | PRN
  Administered 2018-06-27: 130 mL via INTRAVENOUS

## 2018-06-27 MED ORDER — SENNA 8.6 MG PO TABS: 2.0000 | ORAL_TABLET | Freq: Every evening | ORAL | Status: DC | PRN

## 2018-06-27 MED ORDER — CLOPIDOGREL BISULFATE 75 MG PO TABS: 75.0000 mg | ORAL_TABLET | Freq: Every day | ORAL | Status: DC

## 2018-06-27 MED ORDER — ONDANSETRON HCL 4 MG/2ML IJ SOLN: 4.0000 mg | Freq: Four times a day (QID) | INTRAMUSCULAR | Status: DC | PRN

## 2018-06-27 MED ORDER — HEPARIN (PORCINE) IN NACL 1000-0.9 UT/500ML-% IV SOLN
INTRAVENOUS | Status: AC
  Filled 2018-06-27: qty 1000

## 2018-06-27 MED ORDER — PHENOL 1.4 % MT LIQD: 1.0000 | OROMUCOSAL | Status: DC | PRN

## 2018-06-27 MED ORDER — SODIUM CHLORIDE 0.9 % IV SOLN: 500.0000 mL | Freq: Once | INTRAVENOUS | Status: DC | PRN

## 2018-06-27 MED ORDER — SODIUM CHLORIDE 0.9 % IV SOLN: 250.0000 mL | INTRAVENOUS | Status: DC | PRN

## 2018-06-27 MED ORDER — ATORVASTATIN CALCIUM 80 MG PO TABS: 80.0000 mg | ORAL_TABLET | Freq: Every day | ORAL | Status: DC

## 2018-06-27 MED ORDER — CLOPIDOGREL BISULFATE 75 MG PO TABS
75.0000 mg | ORAL_TABLET | Freq: Every day | ORAL | Status: DC
  Administered 2018-06-28 – 2018-07-03 (×6): 75 mg via ORAL
  Filled 2018-06-27 (×6): qty 1

## 2018-06-27 MED ORDER — MORPHINE SULFATE (PF) 2 MG/ML IV SOLN: 2.0000 mg | INTRAVENOUS | Status: DC | PRN

## 2018-06-27 MED ORDER — DOCUSATE SODIUM 100 MG PO CAPS
100.0000 mg | ORAL_CAPSULE | Freq: Every day | ORAL | Status: DC
  Administered 2018-06-28 – 2018-06-29 (×2): 100 mg via ORAL
  Filled 2018-06-27 (×3): qty 1

## 2018-06-27 MED ORDER — ATORVASTATIN CALCIUM 80 MG PO TABS
80.0000 mg | ORAL_TABLET | Freq: Every day | ORAL | Status: DC
  Administered 2018-06-27 – 2018-07-02 (×6): 80 mg via ORAL
  Filled 2018-06-27 (×6): qty 1

## 2018-06-27 MED ORDER — CARVEDILOL 3.125 MG PO TABS: 3.1250 mg | ORAL_TABLET | Freq: Two times a day (BID) | ORAL | Status: DC

## 2018-06-27 MED ORDER — ATROPINE SULFATE 1 MG/10ML IJ SOSY
PREFILLED_SYRINGE | INTRAMUSCULAR | Status: AC
  Filled 2018-06-27: qty 10

## 2018-06-27 MED ORDER — CLONIDINE 0.1 MG/24HR TD PTWK: 0.1000 mg | MEDICATED_PATCH | TRANSDERMAL | 12 refills | Status: DC

## 2018-06-27 MED ORDER — HEPARIN (PORCINE) IN NACL 1000-0.9 UT/500ML-% IV SOLN
INTRAVENOUS | Status: DC | PRN
  Administered 2018-06-27: 500 mL

## 2018-06-27 MED ORDER — ENOXAPARIN SODIUM 40 MG/0.4ML ~~LOC~~ SOLN
40.0000 mg | SUBCUTANEOUS | Status: DC
  Administered 2018-06-28 – 2018-07-03 (×6): 40 mg via SUBCUTANEOUS
  Filled 2018-06-27 (×6): qty 0.4

## 2018-06-27 MED ORDER — ASPIRIN 81 MG PO CHEW
162.0000 mg | CHEWABLE_TABLET | Freq: Every day | ORAL | Status: DC
  Administered 2018-06-27 – 2018-07-03 (×7): 162 mg via ORAL
  Filled 2018-06-27 (×7): qty 2

## 2018-06-27 MED ORDER — GUAIFENESIN-DM 100-10 MG/5ML PO SYRP: 15.0000 mL | ORAL_SOLUTION | ORAL | Status: DC | PRN

## 2018-06-27 MED ORDER — HYDRALAZINE HCL 20 MG/ML IJ SOLN: 5.0000 mg | INTRAMUSCULAR | Status: DC | PRN

## 2018-06-27 MED ORDER — ALUM & MAG HYDROXIDE-SIMETH 200-200-20 MG/5ML PO SUSP: 15.0000 mL | ORAL | Status: DC | PRN

## 2018-06-27 MED ORDER — LIDOCAINE HCL (PF) 1 % IJ SOLN
INTRAMUSCULAR | Status: DC | PRN
  Administered 2018-06-27: 30 mL

## 2018-06-27 MED ORDER — MAGNESIUM SULFATE 2 GM/50ML IV SOLN: 2.0000 g | Freq: Every day | INTRAVENOUS | Status: DC | PRN

## 2018-06-27 MED ORDER — CARVEDILOL 3.125 MG PO TABS
3.1250 mg | ORAL_TABLET | Freq: Two times a day (BID) | ORAL | Status: DC
  Administered 2018-06-28 – 2018-07-03 (×9): 3.125 mg via ORAL
  Filled 2018-06-27 (×9): qty 1

## 2018-06-27 MED ORDER — CLONIDINE HCL 0.1 MG/24HR TD PTWK
0.1000 mg | MEDICATED_PATCH | TRANSDERMAL | Status: DC
  Administered 2018-07-03: 0.1 mg via TRANSDERMAL
  Filled 2018-06-27: qty 1

## 2018-06-27 MED ORDER — HEPARIN SODIUM (PORCINE) 1000 UNIT/ML IJ SOLN
INTRAMUSCULAR | Status: DC | PRN
  Administered 2018-06-27: 5000 [IU] via INTRAVENOUS

## 2018-06-27 MED ORDER — SODIUM CHLORIDE 0.9% FLUSH: 3.0000 mL | INTRAVENOUS | Status: DC | PRN

## 2018-06-27 MED ORDER — BIVALIRUDIN TRIFLUOROACETATE 250 MG IV SOLR
INTRAVENOUS | Status: AC
  Filled 2018-06-27: qty 250

## 2018-06-27 SURGICAL SUPPLY — 23 items
BAG SNAP BAND KOVER 36X36 (MISCELLANEOUS) ×2 IMPLANT
BALLN EMERGE MR 3.0X20 (BALLOONS) ×2
BALLN VIATRAC 5X20X135 (BALLOONS) ×2
BALLOON EMERGE MR 3.0X20 (BALLOONS) ×1 IMPLANT
BALLOON VIATRAC 5X20X135 (BALLOONS) ×1 IMPLANT
CATH ANGIO 5F PIGTAIL 100CM (CATHETERS) ×2 IMPLANT
CATH HEADHUNTER 5F 125CM (CATHETERS) ×2 IMPLANT
CATH INFINITI 5F PIG 125CM (CATHETERS) ×2 IMPLANT
DEVICE EMBOSHIELD NAV6 4.0-7.0 (FILTER) ×2 IMPLANT
KIT ENCORE 26 ADVANTAGE (KITS) ×2 IMPLANT
KIT MICROPUNCTURE NIT STIFF (SHEATH) ×2 IMPLANT
KIT PV (KITS) ×2 IMPLANT
SHEATH PINNACLE 6F 10CM (SHEATH) ×2 IMPLANT
SHEATH PROBE COVER 6X72 (BAG) ×2 IMPLANT
SHEATH SHUTTLE SELECT 6F (SHEATH) ×2 IMPLANT
STENT XACT CAR 9-7X30X136 (Permanent Stent) ×2 IMPLANT
STOPCOCK MORSE 400PSI 3WAY (MISCELLANEOUS) ×2 IMPLANT
SYR MEDRAD MARK V 150ML (SYRINGE) ×2 IMPLANT
TRANSDUCER W/STOPCOCK (MISCELLANEOUS) ×2 IMPLANT
TRAY PV CATH (CUSTOM PROCEDURE TRAY) ×2 IMPLANT
WIRE G VAS 035X260 STIFF (WIRE) ×2 IMPLANT
WIRE HITORQ VERSACORE ST 145CM (WIRE) ×2 IMPLANT
WIRE VERSACORE LOC 115CM (WIRE) ×6 IMPLANT

## 2018-06-27 NOTE — Progress Notes (Addendum)
Site area: RFA Site Prior to Removal:  Level 0 Pressure Applied For: 60 min Manual:   yes Patient Status During Pull:  stable Post Pull Site:  Level 0 Post Pull Instructions Given:  yes Post Pull Pulses Present: palpable Dressing Applied:  clear Bedrest begins @ 41440 till 1840 Comments:DR Fields in to consider inability for pt to hold still. Will hold groin pressure x 60 min.

## 2018-06-27 NOTE — Progress Notes (Signed)
2231-Called to pt's room by nurse technician Alcario Drought. Upon entering pt's room pt was sitting on the floor being assisted by (Sochima) RN and [facility]) NT back to bed. Pt states " I fell, I just wanted to get up".Pt asked did he hit his head, pt responded " Yes". Pt states " I don't know what I hit my head on".  Pt asked was he trying to use the restroom, pt stated " No I was just trying to get up and go". Pt is alert and oriented to self at baseline.  VS were taken by nurse technician. Vital signs are stable. Pt does not complain of any pain, pt states" I feel fine". POA Kendal Hymen [place])  just arrived to unit and was informed of occurrence. Richardson Chiquito- PA notified. Head CT ordered and Tele-monitor ordered. POA informed of updated orders. Will continue to monitor patient.   2322- Pt escorted to CT by radiology technician and assigned RN. Pt remained stable while in CT. Pt denies any pain. POA at bedside upon arriving back to pt's room at 2342. VS taken and are stable. Neurological status remains at baseline. Bed alarm set, bed in lowest position(moves towards the edge), call bell is within reach, room clutter free telemonitor on.  Will continue to monitor patient.

## 2018-06-27 NOTE — Op Note (Addendum)
Procedure: Right carotid angiogram, right carotid angioplasty and stenting 9 x 7 x 30 XACT  Preoperative diagnosis: 80% symptomatic right internal carotid artery stenosis  Postoperative diagnosis: Same   Anesthesia: Local  Co-surgeon: Clotilde Dieter, MD Operative findings: Right femoral sheath.  Right ICA stenosis treated 80% to 0% residual Abbott xact stent  Operative details: After obtaining informed consent, the patient was taken to the PV lab. The patient was placed in supine position the Angio table. Both groins were prepped and draped in usual sterile fashion. Local anesthesia was infiltrated over the right common femoral artery. Ultrasound was used to locate the right common femoral artery.  This was patent.  A permanent image was obtained to place in the patient's medical record.  An introducer needle was placed into the right common femoral artery and there was good backbleeding from this. A 0.035 Versacore wire was then threaded up the abdominal aorta under fluoroscopic guidance. A 6 Fr shuttle sheath was placed over the guidewire into the right common femoral artery.  This was thoroughly flushed with heparinized saline.  A 5 Fr H1 catheter was placed over the wire and the wire advanced into the innominate followed by the right common carotid artery and this was confirmed by contrast angiogram.  There is a 80% stenosis of the right internal carotid with moderate calcification.  The external carotid is patent.  Measurements were taken of the proximal distal and length of the lesion.  7 x 9 x 30 Abbott xact stent was selected.  An 035 Amplatz wire was then placed throught the H1 catheter and advanced to just below the carotid bifurcation.  A 6 French Cook shuttle sheath was then advanced over the guidewire up into the right common carotid artery.  This was advanced over the dilator to the distal right common carotid artery.  At this point the dilator would not push all the way through the sheath so  instead we had to recannulate everything using the H1 catheter and then we advanced the sheath over the H1 catheter as I dilator.  Angiomax bolus was given.  ACT was also checked and was confirmed > 200. At this point an Abbott 7 mm filter was brought on the operative field and this was advanced up to the level of the carotid stenosis.  With some manipulation the guidewire advanced through the stenosis with minimal difficulty. The filter was then advanced into the distal internal carotid artery at the level of the skull base. The filter was then deployed in this location.  The patient was given half a milligram of atropine.  At this point a repeat contrast angiogram was performed to again determined the level of the stenosis. A 3 x 2 angioplasty balloon was brought in operative field and advanced to the level of stenosis and quickly inflated and deflated to 6 atmospheres. The predilatation balloon was removed. A 7 x 9 x 30 Abbott xact stent was then brought in the operative field and advanced to the level of stenosis.  Again using angiographic guidance, the stent was deployed so that the tip of the stent was past the level of the high-grade stenosis. The stent was then deployed .  This portion of the case was fairly difficult due to the patient being agitated and motioning all over the table.  A postdilatation was then performed after the stent delivery device was removed. This was done with a 5 x 2 balloon inflated to 8 atmospheres up and down.  There was no  bradycardia. A completion arteriogram was performed which showed that the residual stenosis was essentially 0 and the stent had good wall apposition.  It was performed in 2 views.  The filter was retrieved Completion intracranial views were not performed as the patient again was very agitated and with motion artifact these films were not going to be usable.. The patient tolerated procedure well other than his occasional confusion and agitation and there were no  complications. After the completion arteriogram had been performed, the sheath was pulled back over a guidewire and these were then pulled down to the guidewire and the sheath exchanged for a 6 French short sheath in the right femoral artery. This was thoroughly flushed with heparinized saline and was left in place to be pulled in the holding area after the ACT is less than 175.The patient tolerated the procedure well and there were no neurologic complications. The patient was transported to the holding area in stable condition.   Fabienne Bruns, MD  [facility]  Office: [phone]  Pager: [phone]

## 2018-06-27 NOTE — Progress Notes (Signed)
Inpatient Rehabilitation-Admissions Coordinator   [facility] continues to follow pt as he was admitted back to acute for planned procedure. Pt will need to be evalutated by acute therapies (OT, PT, SLP) once medically cleared in order for [facility] to begin insurance authorization process for possible return to CIR.   Please call if questions.  [name], OTR/L  Rehab Admissions Coordinator  [phone] 06/27/2018 2:27 PM

## 2018-06-27 NOTE — Progress Notes (Signed)
Pt received from Cath lab. VSS. Pt alert only to himself. Pt restless in bed. Groin site level 0. DP pulses palpable. Will continue to monitor.  Versie Starks, RN

## 2018-06-27 NOTE — Interval H&P Note (Signed)
History and Physical Interval Note:  06/27/2018 7:46 AM  Ian Ramos  has presented today for surgery, with the diagnosis of carotid stenosis  The various methods of treatment have been discussed with the patient and family. After consideration of risks, benefits and other options for treatment, the patient has consented to  Procedure(s): CAROTID PTA/STENT INTERVENTION (N/A) as a surgical intervention .  The patient's history has been reviewed, patient examined, no change in status, stable for surgery.  I have reviewed the patient's chart and labs.  Questions were answered to the patient's satisfaction.     Fabienne Bruns

## 2018-06-27 NOTE — Progress Notes (Signed)
Richardson Chiquito, -PA made aware of blood tinged urine. No new orders. Will continue to monitor.

## 2018-06-27 NOTE — Progress Notes (Addendum)
Pt post carotid stent-reported as being oriented x 1-now x 4. Cuff BP is as much as 25 torr lower than a-line. Sheath is secured in rt groin and site is level zero. Dr Darrick Penna paged as to SBP 70-100 per a line. Pt is pleasantly confused at times.

## 2018-06-27 NOTE — Progress Notes (Signed)
Orthopedic Tech Progress Note Patient Details:  Ian Ramos November 21, 1945 [id number]  Ortho Devices Type of Ortho Device: Knee Immobilizer Ortho Device/Splint Location: cath lab to apply Ortho Device/Splint Interventions: Ordered       Jennye Moccasin 06/27/2018, 11:22 AM

## 2018-06-27 NOTE — Progress Notes (Signed)
Patient currently intermittently confused.  Dr. Darrick Penna made aware of cuff/a-line difference.  Bolus and fluid orders given and carried out.

## 2018-06-27 NOTE — Progress Notes (Signed)
   06/26/18 2230  What Happened  Was fall witnessed? No  Was patient injured? No  Patient found on floor  Found by Staff-comment  Stated prior activity ambulating-unassisted  Follow Up  MD notified Ian Ramos  Time MD notified 2240  Family notified Yes-comment Vaughan Sine)  Time family notified 2240  Additional tests Yes-comment (CT head)  Simple treatment Ice  Progress note created (see row info) Yes  Adult Fall Risk Assessment  Risk Factor Category (scoring not indicated) Fall has occurred during this admission (document High fall risk)  Age 19  Fall History: Fall within 6 months prior to admission 0  Elimination; Bowel and/or Urine Incontinence 2  Elimination; Bowel and/or Urine Urgency/Frequency 2  Medications: includes PCA/Opiates, Anti-convulsants, Anti-hypertensives, Diuretics, Hypnotics, Laxatives, Sedatives, and Psychotropics 3  Patient Care Equipment 0  Mobility-Assistance 2  Mobility-Gait 2  Mobility-Sensory Deficit 0  Altered awareness of immediate physical environment 1  Impulsiveness 2  Lack of understanding of one's physical/cognitive limitations 4  Total Score 20  Patient Fall Risk Level High fall risk  Adult Fall Risk Interventions  Required Bundle Interventions *See Row Information* High fall risk - low, moderate, and high requirements implemented  Additional Interventions Use of appropriate toileting equipment (bedpan, BSC, etc.)  Screening for Fall Injury Risk (To be completed on HIGH fall risk patients) - Assessing Need for Low Bed  Risk For Fall Injury- Low Bed Criteria Previous fall this admission;TeleSitter Camera in use  Will Implement Low Bed and Floor Mats Yes  Vitals  Temp 97.8 F (36.6 C)  Temp Source Oral  BP (!) 133/49  BP Location Right Arm  BP Method Automatic  Patient Position (if appropriate) Lying  Pulse Rate (!) 56  Pulse Rate Source Dinamap  Resp 18  Oxygen Therapy  SpO2 97 %  O2 Device Room Air  Pain Assessment  Pain Scale  0-10  Pain Score 0

## 2018-06-28 ENCOUNTER — Encounter (HOSPITAL_COMMUNITY): Payer: Self-pay

## 2018-06-28 ENCOUNTER — Other Ambulatory Visit: Payer: Self-pay

## 2018-06-28 LAB — BASIC METABOLIC PANEL
ANION GAP: 7 (ref 5–15)
BUN: 18 mg/dL (ref 8–23)
CALCIUM: 8.1 mg/dL — AB (ref 8.9–10.3)
CO2: 22 mmol/L (ref 22–32)
CREATININE: 1.31 mg/dL — AB (ref 0.61–1.24)
Chloride: 107 mmol/L (ref 98–111)
GFR, EST NON AFRICAN AMERICAN: 52 mL/min — AB (ref >60)
Glucose, Bld: 89 mg/dL (ref 70–99)
Potassium: 4 mmol/L (ref 3.5–5.1)
Sodium: 136 mmol/L (ref 135–145)

## 2018-06-28 LAB — CBC
HCT: 23.1 % — ABNORMAL LOW (ref 39.0–52.0)
Hemoglobin: 7.3 g/dL — ABNORMAL LOW (ref 13.0–17.0)
MCH: 28.4 pg (ref 26.0–34.0)
MCHC: 31.6 g/dL (ref 30.0–36.0)
MCV: 89.9 fL (ref 78.0–100.0)
Platelets: 196 10*3/uL (ref 150–400)
RBC: 2.57 MIL/uL — ABNORMAL LOW (ref 4.22–5.81)
RDW: 11.9 % (ref 11.5–15.5)
WBC: 6.7 10*3/uL (ref 4.0–10.5)

## 2018-06-28 LAB — PREPARE RBC (CROSSMATCH)

## 2018-06-28 MED ORDER — SODIUM CHLORIDE 0.9% IV SOLUTION
Freq: Once | INTRAVENOUS | Status: AC
  Administered 2018-06-28: 11:00:00 via INTRAVENOUS

## 2018-06-28 NOTE — Evaluation (Signed)
Clinical/Bedside Swallow Evaluation Patient Details  Name: Ian Ramos MRN: [id number] Date of Birth: 08-Nov-1945  Today's Date: 06/28/2018 Time: SLP Start Time (ACUTE ONLY): 1240 SLP Stop Time (ACUTE ONLY): 1250 SLP Time Calculation (min) (ACUTE ONLY): 10 min  Past Medical History:  Past Medical History:  Diagnosis Date  . Arthritis   . Neuromuscular disorder [facility])    Past Surgical History:  Past Surgical History:  Procedure Laterality Date  . LEFT HEART CATH AND CORONARY ANGIOGRAPHY N/A 06/05/2018   Procedure: LEFT HEART CATH AND CORONARY ANGIOGRAPHY;  Surgeon: Swaziland, Peter M, MD;  Location: [facility] INVASIVE CV LAB;  Service: Cardiovascular;  Laterality: N/A;  . POSTERIOR CERVICAL FUSION/FORAMINOTOMY N/A 05/23/2018   Procedure: Posterior Cervical Fusion with lateral mass fixation - C1 - C6 with laminectomy;  Surgeon: Julio Sicks, MD;  Location: [facility] OR;  Service: Neurosurgery;  Laterality: N/A;   HPI:  Ian Ramos is a 72 y.o. male with medical history significant for chronic kidney disease stage III, neuromuscular disorder, cervical stenosis with myelopathy status post cervical laminectomy on 05/23/2018, presented to the emergency department for evaluation of chest pain. Post cardiac catheterization with lethargy and altered mental status changes. MRI showed subcentimeter acute early subacute infarctions, 2 in the left superior frontal lobe and 2 in the right posterior lateral parietal lobe. Barium esophagram 05/30/18 moderate esophageal dysmotility. Pt was intubated 06/09/18-06/12/18; had FEES 06/13/18 with findings of moderate pharyngeal dysphagia, dys 2/honey thick liquids was recommended. On 06/20/18 pt admitted to CIR, working on cognition and had progressed to dys 2/nectar thick liquids. Readmitted to acute 06/27/18 for transfemoral right carotid stent.    Assessment / Plan / Recommendation Clinical Impression   Patient presents with ongoing signs of pharyngeal dysphagia, with  immediate throat clearing after cup sips of thin liquids. No overt s/sx of aspiration with nectar. Prior to readmit, pt was consuming dys 2, nectar thick liquids on CIR, but was progressing to thin liquid trials with SLP. Pt is alert but confused, oriented to self only. Today he follows basic commands but highly distractible, attempting to get out of bed and to remove mitts. Instrumental testing not appropriate this date. Mentation poses ongoing aspiration risk, recommend downgrading clears to nectar, with CIR to reassess readiness for advancement/repeat instrumental. Will follow up for advancement of solids when cleared by MD post sheath removal.    SLP Visit Diagnosis: Dysphagia, pharyngeal phase (R13.13) Attention and concentration deficit following: Cerebral infarction    Aspiration Risk  Moderate aspiration risk    Diet Recommendation Nectar-thick liquid(may advance to dys 2 when cleared by MD)   Liquid Administration via: Cup Medication Administration: Crushed with puree Compensations: Minimize environmental distractions;Slow rate;Small sips/bites;Lingual sweep for clearance of pocketing Postural Changes: Seated upright at 90 degrees    Other  Recommendations Oral Care Recommendations: Oral care BID Other Recommendations: Order thickener from pharmacy;Prohibited food (jello, ice cream, thin soups);Remove water pitcher;Have oral suction available   Follow up Recommendations Inpatient Rehab      Frequency and Duration min 2x/week  2 weeks       Prognosis Prognosis for Safe Diet Advancement: Good Barriers to Reach Goals: Cognitive deficits      Swallow Study   General Date of Onset: 05/28/18 HPI: Ian Ramos is a 72 y.o. male with medical history significant for chronic kidney disease stage III, neuromuscular disorder, cervical stenosis with myelopathy status post cervical laminectomy on 05/23/2018, presented to the emergency department for evaluation of chest pain. Post  cardiac catheterization  with lethargy and altered mental status changes. MRI showed subcentimeter acute early subacute infarctions, 2 in the left superior frontal lobe and 2 in the right posterior lateral parietal lobe. Barium esophagram 05/30/18 moderate esophageal dysmotility. Pt was intubated 06/09/18-06/12/18; had FEES 06/13/18 with findings of moderate pharyngeal dysphagia, dys 2/honey thick liquids was recommended. On 06/20/18 pt admitted to CIR, working on cognition and had progressed to dys 2/nectar thick liquids. Readmitted to acute 06/27/18 for transfemoral right carotid stent.  Type of Study: Bedside Swallow Evaluation Previous Swallow Assessment: FEES on 9/13: Recommended Dys. 2 textures with honey-thick liquids. Has since been upgraded to nectar-thick liquids.  Diet Prior to this Study: Thin liquids(clear liquids) Temperature Spikes Noted: No Respiratory Status: Nasal cannula History of Recent Intubation: No(during prior admission 9/9-9/12) Behavior/Cognition: Alert;Confused;Distractible Oral Cavity Assessment: Within Functional Limits Oral Care Completed by SLP: Yes Oral Cavity - Dentition: Adequate natural dentition Vision: Functional for self-feeding Self-Feeding Abilities: Needs assist Patient Positioning: Upright in bed Baseline Vocal Quality: Normal Volitional Cough: Strong Volitional Swallow: Able to elicit    Oral/Motor/Sensory Function Overall Oral Motor/Sensory Function: Within functional limits   Ice Chips Ice chips: Not tested   Thin Liquid Thin Liquid: Impaired Presentation: Cup;Spoon Pharyngeal  Phase Impairments: Throat Clearing - Immediate    Nectar Thick Nectar Thick Liquid: Within functional limits Presentation: Cup   Honey Thick Honey Thick Liquid: Not tested   Puree Puree: Not tested   Solid     Rondel Baton, MS, CCC-SLP Speech-Language Pathologist Acute Rehabilitation Services Pager: [phone] Office: [phone]  Solid: Not tested      Arlana Lindau 06/28/2018,1:02 PM

## 2018-06-28 NOTE — Progress Notes (Signed)
[facility]  Subjective  - NAE overnight.  POD#1 s/p right carotid stent via transfemoral approach.   Objective 108/63 90 98 F (36.7 C) (Oral) 18 100%  Intake/Output Summary (Last 24 hours) at 06/28/2018 0802 Last data filed at 06/28/2018 0400 Gross per 24 hour  Intake 1660 ml  Output -  Net 1660 ml    PE General: resting in bed, in restraints Groin: NO hematoma, groin soft, femoral pulse palpable Neuro: CN II-XII grossly intact, moving all extremities spontaneously, no focal deficits.  Laboratory Lab Results: Recent Labs    06/28/18 0441  WBC 6.7  HGB 7.3*  HCT 23.1*  PLT 196   BMET Recent Labs    06/26/18 0643 06/28/18 0441  NA  --  136  K  --  4.0  CL  --  107  CO2  --  22  GLUCOSE  --  89  BUN  --  18  CREATININE 1.28* 1.31*  CALCIUM  --  8.1*    COAG No results found for: INR, PROTIME No results found for: PTT  Assessment/Planning: Doing well POD#1 s/p transfemoral right carotid stent.  Will give 1 uPRBC for Hgb drop of 8.5 to 7.3 and soft BP overnight.  No focal deficits on exam.  Right groin ok.  Will attempt to get back to rehab.  Per rehab note will need OT, PT, SLP re-evaluation to get back to rehab.   Cephus Shelling 06/28/2018 8:02 AM --  Cephus Shelling

## 2018-06-28 NOTE — Evaluation (Signed)
Speech Language Pathology Evaluation Patient Details Name: Ian Ramos MRN: [id number] DOB: 11-Mar-1946 Today's Date: 06/28/2018 Time: 9485-4627 SLP Time Calculation (min) (ACUTE ONLY): 11 min  Problem List:  Patient Active Problem List   Diagnosis Date Noted  . Right-sided extracranial carotid artery stenosis 06/27/2018  . Right middle cerebral artery stroke (HCC) 06/19/2018  . Spondylosis, cervical, with myelopathy   . Neurologic gait disorder   . Essential hypertension   . Acute encephalopathy   . Acute respiratory failure (HCC)   . Acute bilat watershed infarction [facility])   . On mechanically assisted ventilation (HCC)   . Acute systolic CHF (congestive heart failure) (HCC)   . Coronary artery disease due to lipid rich plaque   . DCM (dilated cardiomyopathy) (HCC)   . Intractable pain 05/28/2018  . Chest pain 05/28/2018  . CKD (chronic kidney disease) stage 3, GFR 30-59 ml/min (HCC) 05/28/2018  . Esophageal thickening 05/28/2018  . LBBB (left bundle branch block) 05/28/2018  . Postoperative anemia 05/28/2018  . Status post cervical spinal fusion 05/23/2018  . Cervical myelopathy (HCC) 05/23/2018  . Left arm weakness 09/20/2017  . Brachial plexopathy 09/16/2017  . Need for pneumococcal vaccination 09/16/2017  . Need for influenza vaccination 09/16/2017   Past Medical History:  Past Medical History:  Diagnosis Date  . Arthritis   . Neuromuscular disorder [facility])    Past Surgical History:  Past Surgical History:  Procedure Laterality Date  . LEFT HEART CATH AND CORONARY ANGIOGRAPHY N/A 06/05/2018   Procedure: LEFT HEART CATH AND CORONARY ANGIOGRAPHY;  Surgeon: Swaziland, Peter M, MD;  Location: [facility] INVASIVE CV LAB;  Service: Cardiovascular;  Laterality: N/A;  . POSTERIOR CERVICAL FUSION/FORAMINOTOMY N/A 05/23/2018   Procedure: Posterior Cervical Fusion with lateral mass fixation - C1 - C6 with laminectomy;  Surgeon: Julio Sicks, MD;  Location: [facility] OR;  Service: Neurosurgery;   Laterality: N/A;   HPI:  Ian Ramos is a 72 y.o. male with medical history significant for chronic kidney disease stage III, neuromuscular disorder, cervical stenosis with myelopathy status post cervical laminectomy on 05/23/2018, presented to the emergency department for evaluation of chest pain. Post cardiac catheterization with lethargy and altered mental status changes. MRI showed subcentimeter acute early subacute infarctions, 2 in the left superior frontal lobe and 2 in the right posterior lateral parietal lobe. Barium esophagram 05/30/18 moderate esophageal dysmotility. Pt was intubated 06/09/18-06/12/18; had FEES 06/13/18 with findings of moderate pharyngeal dysphagia, dys 2/honey thick liquids was recommended. On 06/20/18 pt admitted to CIR, working on cognition and had progressed to dys 2/nectar thick liquids. Readmitted to acute 06/27/18 for transfemoral right carotid stent.    Assessment / Plan / Recommendation Clinical Impression   Pt presents with ongoing cognitive-linguistic impairments. He continues to display deficits in intellectual awareness, orientation, sustained attention, problem solving, recall and safety awareness. Oriented to self only, and despite SLP providing re-orientation several times, pt continued perseverating on needing to go home, repeatedly attempting to remove his mitts. Language in simple conversation is appropriate though vague and confused; confrontation naming is impaired. Recommend skilled ST to facilitate awareness of deficits and improve orientation, safety. Will follow acutely.    SLP Assessment  SLP Recommendation/Assessment: Patient needs continued Speech Lanaguage Pathology Services SLP Visit Diagnosis: Cognitive communication deficit (R41.841);Attention and concentration deficit Attention and concentration deficit following: Cerebral infarction    Follow Up Recommendations  Inpatient Rehab    Frequency and Duration min 2x/week  2 weeks      SLP  Evaluation Cognition  Overall Cognitive Status: Impaired/Different from baseline Arousal/Alertness: Awake/alert Orientation Level: Oriented to person Attention: Sustained Focused Attention: Appears intact Sustained Attention: Impaired Sustained Attention Impairment: Functional basic;Verbal basic Memory: Impaired Memory Impairment: Storage deficit;Decreased recall of new information Awareness: Impaired Awareness Impairment: Intellectual impairment Problem Solving: Impaired Problem Solving Impairment: Verbal basic;Functional basic Executive Function: (all impaired due to lower level deficits) Behaviors: Restless;Impulsive Safety/Judgment: Impaired       Comprehension  Auditory Comprehension Overall Auditory Comprehension: Appears within functional limits for tasks assessed Visual Recognition/Discrimination Discrimination: Not tested Reading Comprehension Reading Status: Not tested    Expression Expression Primary Mode of Expression: Verbal Verbal Expression Overall Verbal Expression: Impaired Automatic Speech: Name;Social Response Level of Generative/Spontaneous Verbalization: Sentence Repetition: No impairment Naming: Impairment Confrontation: Impaired(0/4) Verbal Errors: Semantic paraphasias Pragmatics: Impairment Impairments: Abnormal affect Interfering Components: Attention Non-Verbal Means of Communication: Not applicable Written Expression Dominant Hand: Right Written Expression: Not tested   Oral / Motor  Oral Motor/Sensory Function Overall Oral Motor/Sensory Function: Within functional limits Motor Speech Overall Motor Speech: Appears within functional limits for tasks assessed Respiration: Within functional limits Phonation: Normal Resonance: Within functional limits Articulation: Within functional limitis Intelligibility: Intelligible Motor Planning: Witnin functional limits Motor Speech Errors: Not applicable   GO                   Rondel Baton, MS, CCC-SLP Speech-Language Pathologist Acute Rehabilitation Services Pager: [phone] Office: [phone]  Arlana Lindau 06/28/2018, 1:16 PM

## 2018-06-29 LAB — TYPE AND SCREEN
ABO/RH(D): A POS
ANTIBODY SCREEN: NEGATIVE
Unit division: 0

## 2018-06-29 LAB — CBC
HCT: 26.9 % — ABNORMAL LOW (ref 39.0–52.0)
HEMOGLOBIN: 8.8 g/dL — AB (ref 13.0–17.0)
MCH: 28 pg (ref 26.0–34.0)
MCHC: 32.7 g/dL (ref 30.0–36.0)
MCV: 85.7 fL (ref 78.0–100.0)
PLATELETS: 217 10*3/uL (ref 150–400)
RBC: 3.14 MIL/uL — ABNORMAL LOW (ref 4.22–5.81)
RDW: 12.6 % (ref 11.5–15.5)
WBC: 9.9 10*3/uL (ref 4.0–10.5)

## 2018-06-29 LAB — BPAM RBC
Blood Product Expiration Date: 201910212359
ISSUE DATE / TIME: 201909281040
Unit Type and Rh: 6200

## 2018-06-29 MED ORDER — RISPERIDONE 0.25 MG PO TABS
0.2500 mg | ORAL_TABLET | Freq: Every day | ORAL | Status: DC
  Administered 2018-06-29 – 2018-07-03 (×5): 0.25 mg via ORAL
  Filled 2018-06-29 (×5): qty 1

## 2018-06-29 NOTE — Evaluation (Signed)
Occupational Therapy Re- Evaluation Patient Details Name: Ian Ramos MRN: [id number] DOB: August 31, 1946 Today's Date: 06/29/2018    History of Present Illness Pt is a 72 y/o male admitted secondary to intractable chest and L shoulder pain. Neurosurgery feels this is radicular irritation of his left-sided C5 nerve root and started pt on steroids and neurontin. Pt with recent cervical fusion (05/23/18). Imaging negative for PE and showed probable esophagitis. Pt underwent cardiac cath on 9/5. In the AM of 9/6 pt with acute change of mental status and code stroke called. Pt found to have left temporal lobe infarct and right MCA/PCA watershed and right parietal punctate infarcts. Pt also find to have significant carotid stenosis.   Pt coded 07-03-2023 and was intubated.  Extubated 06/12/18.  PMH includes gout and recent Cervical fusion.  Readmitted to acute 06/27/18 for transfemoral right carotid stent.   Clinical Impression   Pt re-admitted from CIR, in CIR he was requiring assist for meals with built up handles, requiring assist for bathing/dressing, transfers. Pt was getting around with assist and RW and also with WC. Pt is currently mod A +2 for transfers, displaying cognitive deficits, generalized weakness, and increased need for assist in ADLs. Pt will benefit from skilled OT in the acute setting and afterwards at venue below. Next session should focus on functional grasp for grooming/eating and continued work on strength/balance for transfer.     Follow Up Recommendations  Supervision/Assistance - 24 hour;CIR    Equipment Recommendations  Other (comment)(defer to next venue of care)    Recommendations for Other Services       Precautions / Restrictions Precautions Precautions: Fall;Cervical Precaution Booklet Issued: No Precaution Comments: reviewed cervical precautions Required Braces or Orthoses: Cervical Brace Cervical Brace: Soft collar;At all times Restrictions Weight Bearing  Restrictions: No      Mobility Bed Mobility Overal bed mobility: Needs Assistance Bed Mobility: Rolling;Sidelying to Sit Rolling: Max assist;+2 for safety/equipment Sidelying to sit: Max assist;+2 for safety/equipment       General bed mobility comments: vc for sequencing throughout. assist with bed pad for rolling and heavy max A for trunk elevation, use of bed pad to bring hips EOB  Transfers Overall transfer level: Needs assistance Equipment used: 2 person hand held assist Transfers: Sit to/from [employer] Sit to Stand: Min assist;+2 physical assistance;Mod assist;From elevated surface(initially min, 2nd trial mod A) Stand pivot transfers: Max assist;+2 physical assistance;+2 safety/equipment;From elevated surface       General transfer comment: vc for anterior weight shift, Pt with strong posterior lean    Balance Overall balance assessment: Needs assistance Sitting-balance support: Bilateral upper extremity supported;Feet supported Sitting balance-Leahy Scale: Poor Sitting balance - Comments: Pt initially requiring max A, able to progress to min guard with cues for midline and very specific cues for hand placement, posterior lean Postural control: Posterior lean Standing balance support: Bilateral upper extremity supported Standing balance-Leahy Scale: Poor Standing balance comment: Pt fatigues very quickly and required increased support                           ADL either performed or assessed with clinical judgement   ADL Overall ADL's : Needs assistance/impaired Eating/Feeding: Moderate assistance;Sitting Eating/Feeding Details (indicate cue type and reason): Pt able to bring items to mouth, able to bring cup of nectar thick apple juice to mouth, assist to hold steady Grooming: Maximal assistance;Sitting;Wash/dry face Grooming Details (indicate cue type and reason): unable to bring  hand higher than his nose this session when it had a wash  cloth in it. Upper Body Bathing: Maximal assistance   Lower Body Bathing: Total assistance   Upper Body Dressing : Maximal assistance   Lower Body Dressing: Maximal assistance;Bed level Lower Body Dressing Details (indicate cue type and reason): does lift feet up to help with donning socks Toilet Transfer: Moderate assistance;+2 for physical assistance;+2 for safety/equipment;Stand-pivot Toilet Transfer Details (indicate cue type and reason): face to face transfer, fatigues quickly Toileting- Clothing Manipulation and Hygiene: Maximal assistance;+2 for safety/equipment;Sit to/from stand Toileting - Clothing Manipulation Details (indicate cue type and reason): Pt unable to bring RUE around to the rear for peri care - required OT assist     Functional mobility during ADLs: (NT this session, SPT only) General ADL Comments: decreased cognition, impacts ability and independence in ADL. Pt kept talking about "loading up his tractor to take his load somewhere" throughout session. decreased strength and ROM in BUE     Vision   Additional Comments: denies double vision     Perception     Praxis      Pertinent Vitals/Pain Pain Assessment: Faces Faces Pain Scale: Hurts a little bit Pain Location: left lower ribs Pain Descriptors / Indicators: Grimacing;Guarding Pain Intervention(s): Monitored during session;Repositioned     Hand Dominance Right   Extremity/Trunk Assessment Upper Extremity Assessment Upper Extremity Assessment: RUE deficits/detail;LUE deficits/detail RUE Deficits / Details: Bil hands with edema, FF to approx 60 degrees, decreased grip strength RUE Sensation: decreased light touch RUE Coordination: decreased fine motor;decreased gross motor LUE Deficits / Details: Bil hands with edema, FF to approx 60 degrees, decreased grip strength LUE Sensation: decreased light touch LUE Coordination: decreased fine motor;decreased gross motor   Lower Extremity Assessment Lower  Extremity Assessment: Defer to PT evaluation   Cervical / Trunk Assessment Cervical / Trunk Assessment: Other exceptions Cervical / Trunk Exceptions: recent cervical surgery    Communication Communication Communication: No difficulties   Cognition Arousal/Alertness: Awake/alert Behavior During Therapy: Flat affect Overall Cognitive Status: Impaired/Different from baseline Area of Impairment: Orientation;Attention;Memory;Following commands;Safety/judgement;Awareness;Problem solving                 Orientation Level: Disoriented to;Place;Time;Situation(2015; knew Trump was president, couldnt say where he was) Current Attention Level: Sustained Memory: Decreased recall of precautions;Decreased short-term memory Following Commands: Follows one step commands with increased time;Follows one step commands consistently Safety/Judgement: Decreased awareness of safety;Decreased awareness of deficits Awareness: Intellectual Problem Solving: Difficulty sequencing;Requires verbal cues;Slow processing;Decreased initiation;Requires tactile cues General Comments: Pt requires increased time and cues to initiate and perform all task.   General Comments  orthostatic in standing, RN aware.    Exercises Exercises: Other exercises Other Exercises Other Exercises: finger, wrist, elbow exercises for edema management; bilateral x10 each   Shoulder Instructions      Home Living Family/patient expects to be discharged to:: Inpatient rehab                                    Lives With: Alone    Prior Functioning/Environment Level of Independence: Needs assistance  Gait / Transfers Assistance Needed: been using WC for long distance, and RW with assist for short distances ADL's / Homemaking Assistance Needed: required assist for bathing/dressing, built up handles for eating            OT Problem List: Decreased strength;Decreased range of motion;Impaired balance (sitting  and/or  standing);Pain;Impaired UE functional use;Decreased activity tolerance;Decreased knowledge of use of DME or AE;Decreased knowledge of precautions;Impaired sensation      OT Treatment/Interventions: Self-care/ADL training;Balance training;Patient/family education;Therapeutic activities;Therapeutic exercise;DME and/or AE instruction    OT Goals(Current goals can be found in the care plan section) Acute Rehab OT Goals Patient Stated Goal: none stated OT Goal Formulation: Patient unable to participate in goal setting Time For Goal Achievement: 07/13/18 Potential to Achieve Goals: Good ADL Goals Pt Will Perform Grooming: with min guard assist;sitting Pt Will Perform Upper Body Dressing: sitting;with min guard assist Pt Will Perform Lower Body Dressing: with mod assist;sit to/from stand Pt Will Transfer to Toilet: with mod assist;stand pivot transfer;bedside commode Pt Will Perform Toileting - Clothing Manipulation and hygiene: with min guard assist;sitting/lateral leans Pt/caregiver will Perform Home Exercise Program: Increased ROM;Increased strength;Both right and left upper extremity;With theraputty;With written HEP provided Additional ADL Goal #1: Pt will perform bed mobility at min A prior to engaging in ADL activity  OT Frequency: Min 2X/week   Barriers to D/C:            Co-evaluation              AM-PAC PT "6 Clicks" Daily Activity     Outcome Measure Help from another person eating meals?: A Lot Help from another person taking care of personal grooming?: A Lot Help from another person toileting, which includes using toliet, bedpan, or urinal?: A Lot Help from another person bathing (including washing, rinsing, drying)?: A Lot Help from another person to put on and taking off regular upper body clothing?: A Lot Help from another person to put on and taking off regular lower body clothing?: A Lot 6 Click Score: 12   End of Session Equipment Utilized During Treatment:  Cervical collar Nurse Communication: Mobility status;Precautions(RN and NT)  Activity Tolerance: Patient tolerated treatment well Patient left: in chair;with call bell/phone within reach;with chair alarm set;Other (comment)(tele sitter in room)  OT Visit Diagnosis: Unsteadiness on feet (R26.81);Other abnormalities of gait and mobility (R26.89);Pain;Muscle weakness (generalized) (M62.81) Pain - Right/Left: Left Pain - part of body: (ribs/abdomen)                Time: 1021-1100 OT Time Calculation (min): 39 min Charges:  OT General Charges $OT Visit: 1 Visit OT Evaluation $OT Re-eval: 1 Re-eval OT Treatments $Therapeutic Activity: 8-22 mins  Sherryl Manges OTR/L Acute Rehabilitation Services Pager: [phone] Office: [phone]   Evern Bio Adrionna Delcid 06/29/2018, 12:15 PM

## 2018-06-29 NOTE — Progress Notes (Addendum)
  Progress Note    06/29/2018 7:22 AM 2 Days Post-Op  Subjective:  Awake in bed; seems comfortable  Afebrile HR 60's-70's  90's-160's systolic 83-98% RA  Vitals:   86/75/44 2044 06/28/18 2300  BP: (!) 118/58 109/73  Pulse: 62 72  Resp: 19 14  Temp: 98.2 F (36.8 C)   SpO2: 96% 96%    Physical Exam: General:  No distress Lungs:  Non labored Incisions:  Right groin is soft without hematoma Extremities:  Moving all extremities equally Neuro:  Is alert to person only; moving all extremities equally and follows commands  CBC    Component Value Date/Time   WBC 9.9 06/29/2018 0257   RBC 3.14 (L) 06/29/2018 0257   HGB 8.8 (L) 06/29/2018 0257   HCT 26.9 (L) 06/29/2018 0257   PLT 217 06/29/2018 0257   MCV 85.7 06/29/2018 0257   MCH 28.0 06/29/2018 0257   MCHC 32.7 06/29/2018 0257   RDW 12.6 06/29/2018 0257   LYMPHSABS 0.8 06/23/2018 0701   MONOABS 0.5 06/23/2018 0701   EOSABS 0.2 06/23/2018 0701   BASOSABS 0.1 06/23/2018 0701    BMET    Component Value Date/Time   NA 136 06/28/2018 0441   K 4.0 06/28/2018 0441   CL 107 06/28/2018 0441   CO2 22 06/28/2018 0441   GLUCOSE 89 06/28/2018 0441   BUN 18 06/28/2018 0441   CREATININE 1.31 (H) 06/28/2018 0441   CALCIUM 8.1 (L) 06/28/2018 0441   GFRNONAA 52 (L) 06/28/2018 0441   GFRAA >60 06/28/2018 0441    INR No results found for: INR   Intake/Output Summary (Last 24 hours) at 06/29/2018 9201 Last data filed at 06/29/2018 0300 Gross per 24 hour  Intake 3003.35 ml  Output 1450 ml  Net 1553.35 ml     Assessment:  72 y.o. male is s/p:  transfemoral right carotid stent  2 Days Post-Op  Plan: -pt is following commands and moving all extremities equally; he is alert to person only. -speech recommends returning to CIR; awaiting PT/OT recs.  Will consult CIR today. -DVT prophylaxis:  Lovenox   Doreatha Massed, PA-C [facility] [phone] 06/29/2018 7:22 AM  I have seen and evaluated  the patient. I agree with the PA note as documented above. 1upRBC and responded.  Groin looks good.  No focal deficits on exam.  Consulted PT/OT/speech yesterday to get him back to rehab.  Will re-consult rehab today.  Cephus Shelling, MD [facility] [place] Office: [phone] Pager: [phone]

## 2018-06-29 NOTE — Progress Notes (Signed)
Patient experience an episode of anxiety, tachycardia (HR 140's), diaphoresis, and tachypnea at 0841.  Calmed patient down and reoriented patient.  Patient was making references to "needing to get things on the trailer and not having time to do it".  Reassured patient and patient's anxiety resolved and vital signs returned to WNL.  Contacted provider Dr. Sherald Hess, MD whose recommendation is to start patient on low dose Risperidol to control anxiety.

## 2018-06-29 NOTE — Evaluation (Signed)
Physical Therapy Evaluation Patient Details Name: Ian Ramos MRN: [id number] DOB: August 11, 1946 Today's Date: 06/29/2018   History of Present Illness  Pt is a 71 y/o male admitted secondary to intractable chest and L shoulder pain. Neurosurgery feels this is radicular irritation of his left-sided C5 nerve root and started pt on steroids and neurontin. Pt with recent cervical fusion (05/23/18). Imaging negative for PE and showed probable esophagitis. Pt underwent cardiac cath on 9/5. In the AM of 9/6 pt with acute change of mental status and code stroke called. Pt found to have left temporal lobe infarct and right MCA/PCA watershed and right parietal punctate infarcts. Pt also find to have significant carotid stenosis.   Pt coded Jul 02, 2023 and was intubated.  Extubated 06/12/18.  PMH includes gout and recent Cervical fusion.  Readmitted to acute 06/27/18 for transfemoral right carotid stent.  Clinical Impression  Patient re-admitted from CIR. On PT evaluation, pt presenting with decreased functional mobility secondary to cognitive impairments, including decreased orientation, awareness, safety/judgment, and memory in addition to weakness and balance deficits. Currently requiring two person moderate-maximal assistance to transfer from bed to chair. Recommend return to CIR to maximize functional independence. Will focus acutely on static/dynamic balance, transfer and gait training in addition to cognitive remediation.      Follow Up Recommendations CIR;Supervision/Assistance - 24 hour    Equipment Recommendations  Other (comment)(TBD at next venue)    Recommendations for Other Services Rehab consult     Precautions / Restrictions Precautions Precautions: Fall;Cervical Precaution Booklet Issued: No Precaution Comments: reviewed cervical precautions Required Braces or Orthoses: Cervical Brace Cervical Brace: Soft collar;At all times Restrictions Weight Bearing Restrictions: No      Mobility  Bed Mobility Overal bed mobility: Needs Assistance Bed Mobility: Rolling;Sidelying to Sit Rolling: Max assist;+2 for safety/equipment Sidelying to sit: Max assist;+2 for safety/equipment       General bed mobility comments: vc for sequencing throughout. assist with bed pad for rolling and heavy max A for trunk elevation, use of bed pad to bring hips EOB  Transfers Overall transfer level: Needs assistance Equipment used: 2 person hand held assist Transfers: Sit to/from [employer] Sit to Stand: +2 physical assistance;Mod assist;From elevated surface Stand pivot transfers: Max assist;+2 physical assistance;+2 safety/equipment;From elevated surface       General transfer comment: vc for anterior weight shift, Pt with strong posterior lean  Ambulation/Gait                Stairs            Wheelchair Mobility    Modified Rankin (Stroke Patients Only)       Balance Overall balance assessment: Needs assistance Sitting-balance support: Bilateral upper extremity supported;Feet supported Sitting balance-Leahy Scale: Poor Sitting balance - Comments: Pt initially requiring max A, able to progress to min guard with cues for midline and very specific cues for hand placement, posterior lean Postural control: Posterior lean Standing balance support: Bilateral upper extremity supported Standing balance-Leahy Scale: Poor Standing balance comment: Pt fatigues very quickly and required increased support                             Pertinent Vitals/Pain Pain Assessment: Faces Faces Pain Scale: Hurts a little bit Pain Location: left lower ribs Pain Descriptors / Indicators: Grimacing;Guarding Pain Intervention(s): Monitored during session    Home Living Family/patient expects to be discharged to:: Inpatient rehab  Prior Function Level of Independence: Needs assistance   Gait / Transfers Assistance Needed: been using  WC for long distance, and RW with assist for short distances  ADL's / Homemaking Assistance Needed: required assist for bathing/dressing, built up handles for eating        Hand Dominance   Dominant Hand: Right    Extremity/Trunk Assessment   Upper Extremity Assessment Upper Extremity Assessment: Defer to OT evaluation RUE Deficits / Details: Bil hands with edema, FF to approx 60 degrees, decreased grip strength RUE Sensation: decreased light touch RUE Coordination: decreased fine motor;decreased gross motor LUE Deficits / Details: Bil hands with edema, FF to approx 60 degrees, decreased grip strength LUE Sensation: decreased light touch LUE Coordination: decreased fine motor;decreased gross motor    Lower Extremity Assessment Lower Extremity Assessment: Generalized weakness    Cervical / Trunk Assessment Cervical / Trunk Assessment: Other exceptions Cervical / Trunk Exceptions: recent cervical surgery   Communication   Communication: No difficulties  Cognition Arousal/Alertness: Awake/alert Behavior During Therapy: Flat affect Overall Cognitive Status: Impaired/Different from baseline Area of Impairment: Orientation;Attention;Memory;Following commands;Safety/judgement;Awareness;Problem solving                 Orientation Level: Disoriented to;Place;Time;Situation(2015; knew Trump was president, couldnt say where he was) Current Attention Level: Sustained Memory: Decreased recall of precautions;Decreased short-term memory Following Commands: Follows one step commands with increased time;Follows one step commands consistently Safety/Judgement: Decreased awareness of safety;Decreased awareness of deficits Awareness: Intellectual Problem Solving: Difficulty sequencing;Requires verbal cues;Slow processing;Decreased initiation;Requires tactile cues General Comments: Pt requires increased time and cues to initiate and perform all task.      General Comments General  comments (skin integrity, edema, etc.): orthostatic in standing, RN aware.    Exercises Other Exercises Other Exercises: finger, wrist, elbow exercises for edema management; bilateral x10 each   Assessment/Plan    PT Assessment Patient needs continued PT services  PT Problem List Decreased strength;Decreased range of motion;Decreased activity tolerance;Decreased balance;Decreased mobility;Decreased cognition;Decreased knowledge of use of DME;Decreased knowledge of precautions;Pain       PT Treatment Interventions DME instruction;Gait training;Therapeutic activities;Stair training;Functional mobility training;Therapeutic exercise;Balance training;Patient/family education    PT Goals (Current goals can be found in the Care Plan section)  Acute Rehab PT Goals Patient Stated Goal: none stated PT Goal Formulation: With patient Time For Goal Achievement: 07/13/18 Potential to Achieve Goals: Good    Frequency Min 3X/week   Barriers to discharge        Co-evaluation               AM-PAC PT "6 Clicks" Daily Activity  Outcome Measure Difficulty turning over in bed (including adjusting bedclothes, sheets and blankets)?: Unable Difficulty moving from lying on back to sitting on the side of the bed? : Unable Difficulty sitting down on and standing up from a chair with arms (e.g., wheelchair, bedside commode, etc,.)?: Unable Help needed moving to and from a bed to chair (including a wheelchair)?: A Lot Help needed walking in hospital room?: Total Help needed climbing 3-5 steps with a railing? : Total 6 Click Score: 7    End of Session Equipment Utilized During Treatment: Gait belt;Cervical collar Activity Tolerance: Patient tolerated treatment well Patient left: in chair;with call bell/phone within reach;with chair alarm set Nurse Communication: Mobility status;Other (comment)(vitals) PT Visit Diagnosis: Unsteadiness on feet (R26.81);Muscle weakness (generalized)  (M62.81);Difficulty in walking, not elsewhere classified (R26.2);Other symptoms and signs involving the nervous system (R29.898) Pain - Right/Left: Left Pain - part of  body: (flank)    Time: 1610-9604 PT Time Calculation (min) (ACUTE ONLY): 34 min   Charges:   PT Evaluation $PT Eval Moderate Complexity: 1 Mod     Laurina Bustle, [place], DPT Acute Rehabilitation Services Pager [phone] Office [phone]   Vanetta Mulders 06/29/2018, 2:27 PM

## 2018-06-29 NOTE — Discharge Instructions (Signed)
° °  [facility] ° °Discharge Instructions ° °Lower Extremity Angiogram; Angioplasty/Stenting ° °Please refer to the following instructions for your post-procedure care. Your surgeon or physician assistant will discuss any changes with you. ° °Activity ° °Avoid lifting more than 8 pounds (1 gallons of milk) for 72 hours (3 days) after your procedure. You may walk as much as you can tolerate. It's OK to drive after 72 hours. ° °Bathing/Showering ° °You may shower the day after your procedure. If you have a bandage, you may remove it at 24- 48 hours. Clean your incision site with mild soap and water. Pat the area dry with a clean towel. ° °Diet ° °Resume your pre-procedure diet. There are no special food restrictions following this procedure. All patients with peripheral vascular disease should follow a low fat/low cholesterol diet. In order to heal from your surgery, it is CRITICAL to get adequate nutrition. Your body requires vitamins, minerals, and protein. Vegetables are the best source of vitamins and minerals. Vegetables also provide the perfect balance of protein. Processed food has little nutritional value, so try to avoid this. ° °Medications ° °Resume taking all of your medications unless your doctor tells you not to. If your incision is causing pain, you may take over-the-counter pain relievers such as acetaminophen (Tylenol) ° °Follow Up ° °Follow up will be arranged at the time of your procedure. You may have an office visit scheduled or may be scheduled for surgery. Ask your surgeon if you have any questions. ° °Please call us immediately for any of the following conditions: °•Severe or worsening pain your legs or feet at rest or with walking. °•Increased pain, redness, drainage at your groin puncture site. °•Fever of 101 degrees or higher. °•If you have any mild or slow bleeding from your puncture site: lie down, apply firm constant pressure over the area with a piece of  gauze or a clean wash cloth for 30 minutes- no peeking!, call 911 right away if you are still bleeding after 30 minutes, or if the bleeding is heavy and unmanageable. ° °Reduce your risk factors of vascular disease: ° °Stop smoking. If you would like help call [facility] at 1-800-QUIT-NOW (1-800-784-8669) or [place] at 336-586-4000. °Manage your cholesterol °Maintain a desired weight °Control your diabetes °Keep your blood pressure down ° °If you have any questions, please call the office at [phone] ° °

## 2018-06-30 ENCOUNTER — Encounter (HOSPITAL_COMMUNITY): Payer: Self-pay | Admitting: *Deleted

## 2018-06-30 ENCOUNTER — Telehealth: Payer: Self-pay | Admitting: Vascular Surgery

## 2018-06-30 MED ORDER — RESOURCE THICKENUP CLEAR PO POWD
ORAL | Status: DC | PRN
  Filled 2018-06-30: qty 125

## 2018-06-30 MED ORDER — DOCUSATE SODIUM 50 MG/5ML PO LIQD
100.0000 mg | Freq: Every day | ORAL | Status: DC
  Administered 2018-06-30 – 2018-07-03 (×4): 100 mg via ORAL
  Filled 2018-06-30 (×4): qty 10

## 2018-06-30 MED FILL — Norepinephrine-Dextrose IV Solution 4 MG/250ML-5%: INTRAVENOUS | Qty: 250 | Status: AC

## 2018-06-30 MED FILL — Heparin Sod (Porcine)-NaCl IV Soln 1000 Unit/500ML-0.9%: INTRAVENOUS | Qty: 500 | Status: AC

## 2018-06-30 NOTE — Telephone Encounter (Signed)
sch appt lvm 07/17/18 11am Carotid 1145am p/o MD

## 2018-06-30 NOTE — [place] FL2 (Signed)
[place] MEDICAID FL2 LEVEL OF CARE SCREENING TOOL     IDENTIFICATION  Patient Name: Ian Ramos Birthdate: November 19, 1945 Sex: male Admission Date (Current Location): 06/27/2018  [facility] and [place] Number:  Producer, television/film/video and Address:  [facility] [place]. [facility], [address] [place], [place] [address]      Provider Number: [id number]  Attending Physician Name and Address:  Sherren Kerns, MD  Relative Name and Phone Number:  Kendal Hymen Whitley,[phone]    Current Level of Care: Hospital Recommended Level of Care: Skilled Nursing Facility Prior Approval Number:    Date Approved/Denied:   PASRR Number: [id number]  Discharge Plan: SNF    Current Diagnoses: Patient Active Problem List   Diagnosis Date Noted  . Right-sided extracranial carotid artery stenosis 06/27/2018  . Right middle cerebral artery stroke (HCC) 06/19/2018  . Spondylosis, cervical, with myelopathy   . Neurologic gait disorder   . Essential hypertension   . Acute encephalopathy   . Acute respiratory failure (HCC)   . Acute bilat watershed infarction [facility])   . On mechanically assisted ventilation (HCC)   . Acute systolic CHF (congestive heart failure) (HCC)   . Coronary artery disease due to lipid rich plaque   . DCM (dilated cardiomyopathy) (HCC)   . Intractable pain 05/28/2018  . Chest pain 05/28/2018  . CKD (chronic kidney disease) stage 3, GFR 30-59 ml/min (HCC) 05/28/2018  . Esophageal thickening 05/28/2018  . LBBB (left bundle branch block) 05/28/2018  . Postoperative anemia 05/28/2018  . Status post cervical spinal fusion 05/23/2018  . Cervical myelopathy (HCC) 05/23/2018  . Left arm weakness 09/20/2017  . Brachial plexopathy 09/16/2017  . Need for pneumococcal vaccination 09/16/2017  . Need for influenza vaccination 09/16/2017    Orientation RESPIRATION BLADDER Height & Weight     Self  Normal External catheter, Incontinent Weight: 156 lb 15.5 oz (71.2  kg) Height:  5\' 7"  (170.2 cm)  BEHAVIORAL SYMPTOMS/MOOD NEUROLOGICAL BOWEL NUTRITION STATUS      Continent Diet(see dc summary)  AMBULATORY STATUS COMMUNICATION OF NEEDS Skin   Extensive Assist Verbally Normal                       Personal Care Assistance Level of Assistance  Feeding, Bathing, Dressing Bathing Assistance: Maximum assistance Feeding assistance: Limited assistance Dressing Assistance: Maximum assistance     Functional Limitations Info  Sight, Hearing, Speech Sight Info: Adequate Hearing Info: Adequate Speech Info: Adequate    SPECIAL CARE FACTORS FREQUENCY  PT (By licensed PT), OT (By licensed OT)     PT Frequency: 5x wk OT Frequency: 5x wk            Contractures Contractures Info: Not present    Additional Factors Info  Code Status, Allergies Code Status Info: Full Code Allergies Info: No known allergies           Current Medications (06/30/2018):  This is the current hospital active medication list Current Facility-Administered Medications  Medication Dose Route Frequency Provider Last Rate Last Dose  . 0.45 % sodium chloride infusion   Intravenous Continuous Sherren Kerns, MD 100 mL/hr at 06/30/18 1231    . 0.9 %  sodium chloride infusion  250 mL Intravenous PRN Fabienne Bruns E, MD      . 0.9 %  sodium chloride infusion  500 mL Intravenous Once PRN Sherren Kerns, MD      . acetaminophen (TYLENOL) tablet 650 mg  650 mg  Oral Q4H PRN Sherren Kerns, MD      . alum & mag hydroxide-simeth (MAALOX/MYLANTA) 200-200-20 MG/5ML suspension 15-30 mL  15-30 mL Oral Q2H PRN Sherren Kerns, MD      . aspirin chewable tablet 162 mg  162 mg Oral Daily Scarlett Presto, RPH   162 mg at 06/30/18 1610  . atorvastatin (LIPITOR) tablet 80 mg  80 mg Oral q1800 Scarlett Presto, RPH   80 mg at 06/29/18 1755  . carvedilol (COREG) tablet 3.125 mg  3.125 mg Oral BID WC Sherren Kerns, MD   3.125 mg at 06/30/18 0948  . [START ON 07/03/2018] cloNIDine  (CATAPRES - Dosed in mg/24 hr) patch 0.1 mg  0.1 mg Transdermal Q Thu Fields, Charles E, MD      . clopidogrel (PLAVIX) tablet 75 mg  75 mg Oral Daily Scarlett Presto, RPH   75 mg at 06/30/18 9604  . docusate (COLACE) 50 MG/5ML liquid 100 mg  100 mg Oral Daily Sherren Kerns, MD   100 mg at 06/30/18 1247  . enoxaparin (LOVENOX) injection 40 mg  40 mg Subcutaneous Q24H Scarlett Presto, RPH   40 mg at 06/30/18 5409  . famotidine (PEPCID) tablet 20 mg  20 mg Oral Daily Sherren Kerns, MD   20 mg at 06/30/18 0948  . guaiFENesin-dextromethorphan (ROBITUSSIN DM) 100-10 MG/5ML syrup 15 mL  15 mL Oral Q4H PRN Sherren Kerns, MD      . hydrALAZINE (APRESOLINE) injection 5 mg  5 mg Intravenous Q20 Min PRN Fields, Janetta Hora, MD      . labetalol (NORMODYNE,TRANDATE) injection 10 mg  10 mg Intravenous Q10 min PRN Sherren Kerns, MD      . magnesium sulfate IVPB 2 g 50 mL  2 g Intravenous Daily PRN Sherren Kerns, MD      . metoprolol tartrate (LOPRESSOR) injection 2-5 mg  2-5 mg Intravenous Q2H PRN Sherren Kerns, MD      . morphine 2 MG/ML injection 2 mg  2 mg Intravenous Q1H PRN Sherren Kerns, MD      . ondansetron [facility]) injection 4 mg  4 mg Intravenous Q6H PRN Sherren Kerns, MD      . oxyCODONE (Oxy IR/ROXICODONE) immediate release tablet 5-10 mg  5-10 mg Oral Q4H PRN Sherren Kerns, MD      . pantoprazole (PROTONIX) EC tablet 40 mg  40 mg Oral Daily Sherren Kerns, MD   40 mg at 06/30/18 0949  . phenol (CHLORASEPTIC) mouth spray 1 spray  1 spray Mouth/Throat PRN Fields, Charles E, MD      . potassium chloride SA (K-DUR,KLOR-CON) CR tablet 20-40 mEq  20-40 mEq Oral Daily PRN Sherren Kerns, MD      . RESOURCE THICKENUP CLEAR   Oral PRN Haskell Riling, [place] Coupe, PA-C      . risperiDONE (RISPERDAL) tablet 0.25 mg  0.25 mg Oral Daily Cephus Shelling, MD   0.25 mg at 06/30/18 0949  . senna (SENOKOT) tablet 17.2 mg  2 tablet Oral QHS PRN Sherren Kerns, MD      . sodium  chloride flush (NS) 0.9 % injection 3 mL  3 mL Intravenous Q12H Fields, Charles E, MD      . sodium chloride flush (NS) 0.9 % injection 3 mL  3 mL Intravenous PRN Sherren Kerns, MD       Facility-Administered Medications Ordered in Other Encounters  Medication  Dose Route Frequency Provider Last Rate Last Dose  . etomidate (AMIDATE) injection    Anesthesia Intra-op Lucinda Dell, CRNA   12 mg at 06/09/18 1445  . succinylcholine (ANECTINE) injection    Anesthesia Intra-op Lucinda Dell, CRNA   100 mg at 06/09/18 1445     Discharge Medications: Please see discharge summary for a list of discharge medications.  Relevant Imaging Results:  Relevant Lab Results:   Additional Information SS# [id number]  Althea Charon, LCSW

## 2018-06-30 NOTE — Clinical Social Work Note (Signed)
Clinical Social Work Assessment  Patient Details  Name: Ian Ramos MRN: [id number] Date of Birth: January 07, 1946  Date of referral:  06/30/18               Reason for consult:  Discharge Planning, Facility Placement                Permission sought to share information with:  Family Supports Permission granted to share information::  Yes, Verbal Permission Granted  Name::     Melida Quitter  Agency::  snf  Relationship::  significant other  Contact Information:  [phone]  Housing/Transportation Living arrangements for the past 2 months:  Single Family Home Source of Information:  Other (Comment Required)(significant other) Patient Interpreter Needed:  None Criminal Activity/Legal Involvement Pertinent to Current Situation/Hospitalization:  No - Comment as needed Significant Relationships:  Adult Children, Siblings, Significant Other Lives with:  Self Do you feel safe going back to the place where you live?  Yes Need for family participation in patient care:  Yes (Comment)  Care giving concerns:  No family at bedside. CSW unable to do assessment with patient as patient is only orient to self. CSW spoke with patients significant other Kendal Hymen via Barrister's clerk / plan:  CSW received consult from CIR stating that family is unable to give patient 24 hr care and has chosen to send to SNF. CSW contacted Kendal Hymen via phone and she stated that she shares POA with patients sister. Kendal Hymen stated she is agreeable for patient to go to rehab and she would like to look at facilities prior to his discharge. CSW went over process with Kendal Hymen and stated before patient can discharge patient will need authorization through insurance. CSW to follow up with family once bed offers have been attained.   Employment status:  Retired Health and safety inspector:  Other (Comment Required)(bc/bs) PT Recommendations:  Inpatient Rehab Consult Information / Referral to community resources:   Skilled Nursing Facility  Patient/Family's Response to care:  Family supportive of patient and his needs  Patient/Family's Understanding of and Emotional Response to Diagnosis, Current Treatment, and Prognosis:  Family agreeable with patient going to SNF  Emotional Assessment Appearance:  Appears stated age Attitude/Demeanor/Rapport:  Other Affect (typically observed):  Unable to Assess Orientation:  Oriented to Self Alcohol / Substance use:  Not Applicable Psych involvement (Current and /or in the community):  No (Comment)  Discharge Needs  Concerns to be addressed:  Care Coordination Readmission within the last 30 days:  No Current discharge risk:  Dependent with Mobility Barriers to Discharge:  Continued Medical Work up, Insurance Authorization   Althea Charon, LCSW 06/30/2018, 10:29 AM

## 2018-06-30 NOTE — Progress Notes (Signed)
Inpatient Rehabilitation-Admissions Coordinator   Discussed potential return to CIR with pt's significant other and sister. Both agreed that pt will not be safe at home as they are unable to provide the 24/7 A he will need. They would like to pursue long term placement. AC educated them that pt will now need SNF rehab if the plan is for nursing home placement. Both in agreement of new dispo plans. AC has contacted CM and SW regarding need for SNF.  Please call if questions. AC will sign off at this time.   [name], OTR/L  Rehab Admissions Coordinator  [phone] 06/30/2018 9:18 AM

## 2018-06-30 NOTE — Progress Notes (Signed)
  Speech Language Pathology Treatment: Dysphagia;Cognitive-Linquistic  Patient Details Name: Ian Ramos MRN: [id number] DOB: [birth year] Today's Date: 06/30/2018 Time: 1224-4975 SLP Time Calculation (min) (ACUTE ONLY): 15 min  Assessment / Plan / Recommendation Clinical Impression  Ian Ramos continues to need max support due to cognitive-communicative impairments. Throughout session, pt redirected using environmental cues for correct location (white board). Three times he stated he was at "home" and unable to retrieve correct location after delay of 3-4 minutes.  From surgery service pt continues on a clear liquid diet thickened to nectar consistency. Using hand over hand support pt consumed total of 8 oz (nectar thick apple juice and cranberry juice) with immediate but soft/subtle throat clear. Vitals are stable. Would recommend upgrade to Dys 2 IF allowed from a surgical team (continue nectar)? ST will continue treatment.    HPI HPI: Ian Ramos is a 72 y.o. male with medical history significant for chronic kidney disease stage III, neuromuscular disorder, cervical stenosis with myelopathy status post cervical laminectomy on 05/23/2018, presented to the emergency department for evaluation of chest pain. Post cardiac catheterization with lethargy and altered mental status changes. MRI showed subcentimeter acute early subacute infarctions, 2 in the left superior frontal lobe and 2 in the right posterior lateral parietal lobe. Barium esophagram 05/30/18 moderate esophageal dysmotility. Pt was intubated 06/09/18-06/12/18; had FEES 06/13/18 with findings of moderate pharyngeal dysphagia, dys 2/honey thick liquids was recommended. On 06/20/18 pt admitted to CIR, working on cognition and had progressed to dys 2/nectar thick liquids. Readmitted to acute 06/27/18 for transfemoral right carotid stent.       SLP Plan  Continue with current plan of care       Recommendations  Diet  recommendations: Nectar-thick liquid(clear liquids per MD, nectar thick) Liquids provided via: Cup Medication Administration: Crushed with puree Supervision: Staff to assist with self feeding;Full supervision/cueing for compensatory strategies Compensations: Minimize environmental distractions;Slow rate;Small sips/bites;Lingual sweep for clearance of pocketing Postural Changes and/or Swallow Maneuvers: Seated upright 90 degrees                Oral Care Recommendations: Oral care BID Follow up Recommendations: Skilled Nursing facility SLP Visit Diagnosis: Dysphagia, oropharyngeal phase (R13.12);Cognitive communication deficit (P00.511) Plan: Continue with current plan of care       GO                Ian Ramos 06/30/2018, 12:03 PM

## 2018-06-30 NOTE — Progress Notes (Addendum)
[facility]  Subjective  - Alert and answering questions appropriately, not oriented to place.    Objective (!) 140/113 (!) 54 97.8 F (36.6 C) (Oral) (!) 22 100%  Intake/Output Summary (Last 24 hours) at 06/30/2018 0710 Last data filed at 06/30/2018 0217 Gross per 24 hour  Intake 2274.53 ml  Output 2825 ml  Net -550.47 ml    Right groin soft without hematoma Gen NAD Heart sinus brady Lungs non labored breathing   Assessment/Planning: occluded left carotid artery with a greater than 80% right carotid stenosis POD # 3 Right carotid angiogram, right carotid angioplasty and stenting 9 x 7 x 30 XACT laminectomy of C1-C6  Soft collar in place HGB 8.8 1 unit PRBC Pending CIR admission  Mosetta Pigeon 06/30/2018 7:10 AM -- At neuro baseline.  Cognitive deficits no  Motor or sensory deficits Apparently no longer a candidate for inpatient rehab SnF when available  Fabienne Bruns, MD [facility] Office: [phone] Pager: [phone]  Laboratory Lab Results: Recent Labs    06/28/18 0441 06/29/18 0257  WBC 6.7 9.9  HGB 7.3* 8.8*  HCT 23.1* 26.9*  PLT 196 217   BMET Recent Labs    06/28/18 0441  NA 136  K 4.0  CL 107  CO2 22  GLUCOSE 89  BUN 18  CREATININE 1.31*  CALCIUM 8.1*    COAG No results found for: INR, PROTIME No results found for: PTT

## 2018-07-01 ENCOUNTER — Encounter (HOSPITAL_COMMUNITY): Payer: Self-pay | Admitting: *Deleted

## 2018-07-01 NOTE — Progress Notes (Signed)
Clinical Social Worker following family for support. Family made a decision on facilities and CSW was able to start authorization through insurance. Facility stated they will follow up with family once Berkley Harvey has been obtained.   Marrianne Mood, MSW,  [employer] [phone]

## 2018-07-01 NOTE — Discharge Summary (Addendum)
[facility] Discharge Summary   Patient ID:  Ian Ramos MRN: [id number] DOB/AGE: 74-Feb-1947 74 y.o.  Admit date: 06/27/2018 Discharge date: 07/03/2018 Date of Surgery: 06/27/2018 Surgeon: Surgeon(s): Fields, Janetta Hora, MD Cephus Shelling, MD  Admission Diagnosis: Right-sided extracranial carotid artery stenosis [I65.21]  Discharge Diagnoses:  Right-sided extracranial carotid artery stenosis [I65.21]  Secondary Diagnoses: Past Medical History:  Diagnosis Date  . Arthritis   . Neuromuscular disorder (HCC)     Procedure(s): CAROTID PTA/STENT INTERVENTION  Discharged Condition: stable  HPI:  Severe cervical stenosis with myelopathy 05/25/2018 discharged s/p C1-C6 decompressive laminectomy by Dr. Jordan Likes Cervical stenosis with myelopathy, status post decompression and fusion    Admitted 05/28/2018 Acute bilateral CVA 72 year old male patient history of stage III chronic kidney disease, cervical spine stenosis status post recent cervical laminectomy August 2019. Presented to the emergency room on 8/28 with chief complaint of chest pain, shoulder pain and substernal discomfort.. Was found to have some esophageal thickening, possibly some esophageal dysmotility, etiology was never clearly identified but resolved. He was found to have new dilated cardiomyopathy with EF 35% and diffuse hypokinesis, and bundle branch block, but it was unclear as to whether or not if this was the primary cause of his discomfort. The plan was to proceed with iliac ischemia evaluation. He underwent cardiac catheterization on 9/5: Showed moderate obstructive coronary artery disease with 75% occluded proximal LAD 75% OM 2 occluded RV marginal branch with left to right collaterals.  This was complicated by right middle cerebral artery, posterior cerebral artery watershed and right parietal punctate infarction after cardiac catheterization 06/05/2018.This was not considered critical  CAD and medical management was recommended. On 9/6 the patient developed acute mental status change. On initial eval he was confused, preservation, and had right upper extremity drift and right visual field cut. CT brain history left temporal lobe infarct felt possibly embolic in etiology. Carotid ultrasound showed likely left carotid occlusion with high-grade right carotid stenosis. He was scheduled for surgery Right carotid angiogram, right carotid angioplasty and stenting 9 x 7 x 30 XACT. He was discharged from [facility] rehab for surgery and the plan was to re admit him to CIR post surgery.    Hospital Course:  Ian Ramos is a 72 y.o. male is S/P Right Procedure(s): CAROTID PTA/STENT INTERVENTION Post op At neuro baseline.  Cognitive deficits no  Motor or sensory deficits.   Consults:   REHABILITATION HOSPITAL COURSE:  The patient was admitted to inpatient rehab services.  Therapies initiated on a 3-hour daily basis, consisting of physical therapy, occupational therapy, speech therapy and rehabilitation nursing.  The following issues  were addressed during patient's rehabilitation stay.  Pertaining to the patient's cervical stenosis and myelopathy, he had undergone decompression and fusion 08/23.  Followed by Dr. Jordan Likes.  His course was complicated by right MCA, PCA watershed infarction  after cardiac catheterization.  Medical management by cardiology services.  Findings of ICA stenosis.  Follow up per Vascular surgery for plan of right carotid stenting to be completed 06/27/2018.  His blood pressures remain well controlled on present  regimen.  Subcutaneous Lovenox for DVT prophylaxis.  Currently on a dysphagia #2 nectar thick liquid diet.  The patient was receiving ongoing therapies as documented.  He was ambulating 100 feet rolling walker, minimal assistance, decreased gait.  He  stood on compliant surfaces, minimal assist for overall balance.  Completed car transfers, SUV simulated height,  minimal assist.  He can gather simple belongings for activities  of daily living and homemaking.  In light of need for ICA stenting, he was  discharged to acute care services 06/27/2018.  All medication changes made as per vascular surgery.    Speech therapy evaluation 06/30/2018 Recommendations  Diet recommendations: Nectar-thick liquid(clear liquids per MD, nectar thick) Liquids provided via: Cup Medication Administration: Crushed with puree Supervision: Staff to assist with self feeding;Full supervision/cueing for compensatory strategies Compensations: Minimize environmental distractions;Slow rate;Small sips/bites;Lingual sweep for clearance of pocketing Postural Changes and/or Swallow Maneuvers: Seated upright 90 degrees  Recommendation for SNF needs 24 hour care. Per Neurosurgery he may remove the soft collar.   No overhead lifting, 0-90 degrees B UE lifting > 20 lbs.     Significant Diagnostic Studies: CBC Lab Results  Component Value Date   WBC 9.9 06/29/2018   HGB 8.8 (L) 06/29/2018   HCT 26.9 (L) 06/29/2018   MCV 85.7 06/29/2018   PLT 217 06/29/2018    BMET    Component Value Date/Time   NA 136 06/28/2018 0441   K 4.0 06/28/2018 0441   CL 107 06/28/2018 0441   CO2 22 06/28/2018 0441   GLUCOSE 89 06/28/2018 0441   BUN 18 06/28/2018 0441   CREATININE 1.31 (H) 06/28/2018 0441   CALCIUM 8.1 (L) 06/28/2018 0441   GFRNONAA 52 (L) 06/28/2018 0441   GFRAA >60 06/28/2018 0441   COAG No results found for: INR, PROTIME   Disposition:  Discharge to :Skilled nursing facility Discharge Instructions    Call MD for:  redness, tenderness, or signs of infection (pain, swelling, bleeding, redness, odor or green/yellow discharge around incision site)   Complete by:  As directed    Call MD for:  redness, tenderness, or signs of infection (pain, swelling, bleeding, redness, odor or green/yellow discharge around incision site)   Complete by:  As directed    Call MD for:   severe or increased pain, loss or decreased feeling  in affected limb(s)   Complete by:  As directed    Call MD for:  severe or increased pain, loss or decreased feeling  in affected limb(s)   Complete by:  As directed    Call MD for:  temperature >100.5   Complete by:  As directed    Call MD for:  temperature >100.5   Complete by:  As directed    Resume previous diet   Complete by:  As directed    Resume previous diet   Complete by:  As directed      Allergies as of 07/03/2018   No Known Allergies     Medication List    TAKE these medications   acetaminophen 325 MG tablet Commonly known as:  TYLENOL Take 2 tablets (650 mg total) by mouth every 4 (four) hours as needed for headache or mild pain.   aspirin 81 MG chewable tablet Place 2 tablets (162 mg total) into feeding tube daily.   atorvastatin 80 MG tablet Commonly known as:  LIPITOR Place 1 tablet (80 mg total) into feeding tube daily at 6 PM.   carvedilol 3.125 MG tablet Commonly known as:  COREG Take 1 tablet (3.125 mg total) by mouth 2 (two) times daily with a meal.   cloNIDine 0.1 mg/24hr patch Commonly known as:  CATAPRES - Dosed in mg/24 hr Place 1 patch (0.1 mg total) onto the skin every Thursday.   clopidogrel 75 MG tablet Commonly known as:  PLAVIX Place 1 tablet (75 mg total) into feeding tube daily.   enoxaparin 40  MG/0.4ML injection Commonly known as:  LOVENOX Inject 0.4 mLs (40 mg total) into the skin daily.   famotidine 20 MG tablet Commonly known as:  PEPCID Take 1 tablet (20 mg total) by mouth daily.   senna 8.6 MG Tabs tablet Commonly known as:  SENOKOT Take 2 tablets (17.2 mg total) by mouth at bedtime as needed for mild constipation.      Verbal and written Discharge instructions given to the patient. Wound care per Discharge AVS  Contact information for follow-up providers    Sherren Kerns, MD In 4 weeks.   Specialties:  Vascular Surgery, Cardiology Why:  Office will call you to  arrange your appt (sent) Contact information: [address] [place] [address] [phone]            Contact information for after-discharge care    Destination    [facility] .   Service:  Skilled Nursing Contact [address]: [address] [place] [address] [phone]                  Signed: Mosetta Pigeon 07/03/2018, 9:56 AM

## 2018-07-01 NOTE — Progress Notes (Signed)
Physical Therapy Treatment Patient Details Name: Ian Ramos MRN: [id number] DOB: 1946-06-16 Today's Date: 07/01/2018    History of Present Illness Pt is a 72 y/o male admitted secondary to intractable chest and L shoulder pain. Neurosurgery feels this is radicular irritation of his left-sided C5 nerve root and started pt on steroids and neurontin. Pt with recent cervical fusion (05/23/18). Imaging negative for PE and showed probable esophagitis. Pt underwent cardiac cath on 9/5. In the AM of 9/6 pt with acute change of mental status and code stroke called. Pt found to have left temporal lobe infarct and right MCA/PCA watershed and right parietal punctate infarcts. Pt also find to have significant carotid stenosis.   Pt coded 2023-06-14 and was intubated.  Extubated 06/12/18.  PMH includes gout and recent Cervical fusion.  Readmitted to acute 06/27/18 for transfemoral right carotid stent.    PT Comments    Pt making steady progress. Agree with plan for SNF.    Follow Up Recommendations  Supervision/Assistance - 24 hour;SNF     Equipment Recommendations  Other (comment)(TBD at next venue)    Recommendations for Other Services       Precautions / Restrictions Precautions Precautions: Fall;Cervical Precaution Booklet Issued: No Required Braces or Orthoses: Cervical Brace Cervical Brace: Soft collar;At all times Restrictions Weight Bearing Restrictions: No    Mobility  Bed Mobility Overal bed mobility: Needs Assistance Bed Mobility: Rolling;Sidelying to Sit Rolling: Mod assist Sidelying to sit: Max assist       General bed mobility comments: Assist to elevate bring legs off bed, elevate trunk into sitting and bring hips to EOB.  Transfers Overall transfer level: Needs assistance Equipment used: Rolling walker (2 wheeled) Transfers: Sit to/from Stand Sit to Stand: Mod assist;+2 safety/equipment         General transfer comment: Assist to bring hips up and for balance. Hands  on walker to encourage anterior weight shift.  Ambulation/Gait Ambulation/Gait assistance: Mod assist;Min assist Gait Distance (Feet): 120 Feet Assistive device: Rolling walker (2 wheeled) Gait Pattern/deviations: Step-through pattern;Decreased step length - right;Decreased step length - left;Shuffle;Trunk flexed Gait velocity: decr Gait velocity interpretation: <1.31 ft/sec, indicative of household ambulator General Gait Details: Assist for balance and support. Verbal cues to stand more erect and look up.   Stairs             Wheelchair Mobility    Modified Rankin (Stroke Patients Only)       Balance Overall balance assessment: Needs assistance Sitting-balance support: Bilateral upper extremity supported;Feet supported Sitting balance-Leahy Scale: Poor Sitting balance - Comments: Pt initially required max assist and then progressed to supervision. Postural control: Right lateral lean Standing balance support: Bilateral upper extremity supported Standing balance-Leahy Scale: Poor Standing balance comment: walker and min assist for static standing                            Cognition Arousal/Alertness: Awake/alert Behavior During Therapy: Flat affect Overall Cognitive Status: Impaired/Different from baseline Area of Impairment: Orientation;Attention;Memory;Following commands;Safety/judgement;Awareness;Problem solving                 Orientation Level: Disoriented to;Place;Time;Situation(2015; knew Trump was president, couldnt say where he was) Current Attention Level: Sustained Memory: Decreased recall of precautions;Decreased short-term memory Following Commands: Follows one step commands with increased time;Follows one step commands consistently Safety/Judgement: Decreased awareness of safety;Decreased awareness of deficits Awareness: Intellectual Problem Solving: Difficulty sequencing;Requires verbal cues;Slow processing;Decreased  initiation;Requires tactile cues General Comments:  Incr time and cues to initiate movment      Exercises      General Comments        Pertinent Vitals/Pain Pain Assessment: No/denies pain    Home Living                      Prior Function            PT Goals (current goals can now be found in the care plan section) Acute Rehab PT Goals Patient Stated Goal: none stated Progress towards PT goals: Progressing toward goals    Frequency    Min 2X/week      PT Plan Discharge plan needs to be updated;Frequency needs to be updated    Co-evaluation              AM-PAC PT "6 Clicks" Daily Activity  Outcome Measure  Difficulty turning over in bed (including adjusting bedclothes, sheets and blankets)?: Unable Difficulty moving from lying on back to sitting on the side of the bed? : Unable Difficulty sitting down on and standing up from a chair with arms (e.g., wheelchair, bedside commode, etc,.)?: Unable Help needed moving to and from a bed to chair (including a wheelchair)?: A Lot Help needed walking in hospital room?: A Lot Help needed climbing 3-5 steps with a railing? : Total 6 Click Score: 8    End of Session Equipment Utilized During Treatment: Gait belt;Cervical collar Activity Tolerance: Patient tolerated treatment well Patient left: in chair;with call bell/phone within reach;with chair alarm set;with nursing/sitter in room Nurse Communication: Mobility status PT Visit Diagnosis: Unsteadiness on feet (R26.81);Muscle weakness (generalized) (M62.81);Difficulty in walking, not elsewhere classified (R26.2);Other symptoms and signs involving the nervous system (F74.944)     Time: 9675-9163 PT Time Calculation (min) (ACUTE ONLY): 21 min  Charges:  $Gait Training: 8-22 mins                     [facility] PT Acute Rehabilitation Services Pager [phone] Office [phone]    Angelina Ok [facility] 07/01/2018, 5:14 PM

## 2018-07-01 NOTE — Progress Notes (Signed)
Clinical Social Worker following patient for support and discharge needs. Patient gave family the two facilities that were able to make a bed offer on behalf of patient. Patients significant other stated they have to check out both facilities before making a decision    12:00pm:  CSW contacted Kendal Hymen (pateints significant other) to see if she has made a decision on which facilities she would like patient to discharge too. Kendal Hymen stated she and patients sister are discussion the options and will reach out to CSW when decision have been made.    Marrianne Mood, MSW,  [employer] [phone]

## 2018-07-01 NOTE — Progress Notes (Signed)
[facility] [place]  Subjective  - he is in [place]  Objective 114/62 (!) 48 (!) 97.4 F (36.3 C) (Oral) 10 99%  Intake/Output Summary (Last 24 hours) at 07/01/2018 1059 Last data filed at 07/01/2018 0407 Gross per 24 hour  Intake 3654.06 ml  Output 2650 ml  Net 1004.06 ml   Neuro moves UE/LE symmetrically Alert and oriented x 1 baseline  Assessment/Planning: Still OK for d/c when SNF available  Fabienne Bruns 07/01/2018 10:59 AM --  Laboratory Lab Results: Recent Labs    06/29/18 0257  WBC 9.9  HGB 8.8*  HCT 26.9*  PLT 217   BMET No results for input(s): NA, K, CL, CO2, GLUCOSE, BUN, CREATININE, CALCIUM in the last 72 hours.  COAG No results found for: INR, PROTIME No results found for: PTT

## 2018-07-02 ENCOUNTER — Other Ambulatory Visit: Payer: Self-pay

## 2018-07-02 DIAGNOSIS — I6521 Occlusion and stenosis of right carotid artery: Secondary | ICD-10-CM

## 2018-07-02 LAB — GLUCOSE, CAPILLARY: Glucose-Capillary: 96 mg/dL (ref 70–99)

## 2018-07-02 NOTE — Progress Notes (Signed)
Occupational Therapy Treatment Patient Details Name: Ian Ramos MRN: [id number] DOB: 10-Mar-1946 Today's Date: 07/02/2018    History of present illness Pt is a 72 y/o male admitted secondary to intractable chest and L shoulder pain. Neurosurgery feels this is radicular irritation of his left-sided C5 nerve root and started pt on steroids and neurontin. Pt with recent cervical fusion (05/23/18). Imaging negative for PE and showed probable esophagitis. Pt underwent cardiac cath on 9/5. In the AM of 9/6 pt with acute change of mental status and code stroke called. Pt found to have left temporal lobe infarct and right MCA/PCA watershed and right parietal punctate infarcts. Pt also find to have significant carotid stenosis.   Pt coded 2023/06/16 and was intubated.  Extubated 06/12/18.  PMH includes gout and recent Cervical fusion.  Readmitted to acute 06/27/18 for transfemoral right carotid stent.   OT comments  Pt making steady progress towards acute OT goals. Focus of session was simulated toilet transfer (EOB>recliner). Updated d/c recommendation to SNF.    Follow Up Recommendations  SNF    Equipment Recommendations  Other (comment)(defer to next venue)    Recommendations for Other Services      Precautions / Restrictions Precautions Precautions: Fall;Cervical Precaution Booklet Issued: No Required Braces or Orthoses: Cervical Brace Cervical Brace: Soft collar;At all times Restrictions Weight Bearing Restrictions: No       Mobility Bed Mobility Overal bed mobility: Needs Assistance Bed Mobility: Supine to Sit     Supine to sit: Mod assist;HOB elevated     General bed mobility comments: Physical and sequencial cues needed throught all aspects of bed mobility.   Transfers Overall transfer level: Needs assistance Equipment used: Rolling walker (2 wheeled) Transfers: Sit to/from [employer] Sit to Stand: Mod assist;+2 safety/equipment Stand pivot transfers: Mod  assist;+2 safety/equipment       General transfer comment: Assist to bring hips up and for balance. Hands on walker to encourage anterior weight shift.    Balance Overall balance assessment: Needs assistance Sitting-balance support: Bilateral upper extremity supported;Feet supported Sitting balance-Leahy Scale: Poor Sitting balance - Comments: Pt initially required max assist and then progressed to supervision. Postural control: Right lateral lean Standing balance support: Bilateral upper extremity supported Standing balance-Leahy Scale: Poor Standing balance comment: walker and min assist for static standing                           ADL either performed or assessed with clinical judgement   ADL Overall ADL's : Needs assistance/impaired                         Toilet Transfer: Moderate assistance;+2 for safety/equipment;Stand-pivot;BSC;RW Toilet Transfer Details (indicate cue type and reason): simulated with EOB>recliner. Successful on second attempt. Fatigues quickly and easily.            General ADL Comments: Pt completed bed mobility and pivoted to recliner. Increased time and effort, sequencing cues 2/2 cognition.      Vision       Perception     Praxis      Cognition Arousal/Alertness: Awake/alert Behavior During Therapy: Flat affect Overall Cognitive Status: Impaired/Different from baseline Area of Impairment: Orientation;Attention;Memory;Following commands;Safety/judgement;Awareness;Problem solving                   Current Attention Level: Sustained Memory: Decreased recall of precautions;Decreased short-term memory Following Commands: Follows one step commands with increased time;Follows one  step commands consistently Safety/Judgement: Decreased awareness of safety;Decreased awareness of deficits Awareness: Intellectual Problem Solving: Difficulty sequencing;Requires verbal cues;Slow processing;Decreased initiation;Requires  tactile cues General Comments: Incr time and cues to initiate movment. Answering questions with short phrases, vague responses, "Maybe" "I don't know"        Exercises     Shoulder Instructions       General Comments      Pertinent Vitals/ Pain       Pain Assessment: Faces Faces Pain Scale: Hurts a little bit Pain Location: unspecified Pain Descriptors / Indicators: Grimacing;Guarding Pain Intervention(s): Monitored during session  Home Living                                          Prior Functioning/Environment              Frequency  Min 2X/week        Progress Toward Goals  OT Goals(current goals can now be found in the care plan section)  Progress towards OT goals: Progressing toward goals  Acute Rehab OT Goals Patient Stated Goal: none stated OT Goal Formulation: Patient unable to participate in goal setting Time For Goal Achievement: 07/13/18 Potential to Achieve Goals: Good ADL Goals Pt Will Perform Grooming: with min guard assist;sitting Pt Will Perform Upper Body Bathing: sitting;with mod assist Pt Will Perform Lower Body Bathing: sit to/from stand;with mod assist Pt Will Perform Upper Body Dressing: sitting;with min guard assist Pt Will Perform Lower Body Dressing: with mod assist;sit to/from stand Pt Will Transfer to Toilet: with mod assist;stand pivot transfer;bedside commode Pt Will Perform Toileting - Clothing Manipulation and hygiene: with min guard assist;sitting/lateral leans Pt Will Perform Tub/Shower Transfer: Tub transfer;ambulating;shower seat Pt/caregiver will Perform Home Exercise Program: Increased ROM;Increased strength;Both right and left upper extremity;With theraputty;With written HEP provided Additional ADL Goal #1: Pt will perform bed mobility at min A prior to engaging in ADL activity  Plan Discharge plan needs to be updated    Co-evaluation                 AM-PAC PT "6 Clicks" Daily Activity      Outcome Measure   Help from another person eating meals?: A Lot Help from another person taking care of personal grooming?: A Lot Help from another person toileting, which includes using toliet, bedpan, or urinal?: A Lot Help from another person bathing (including washing, rinsing, drying)?: A Lot Help from another person to put on and taking off regular upper body clothing?: A Lot Help from another person to put on and taking off regular lower body clothing?: A Lot 6 Click Score: 12    End of Session Equipment Utilized During Treatment: Cervical collar  OT Visit Diagnosis: Unsteadiness on feet (R26.81);Other abnormalities of gait and mobility (R26.89);Pain;Muscle weakness (generalized) (M62.81)   Activity Tolerance Patient tolerated treatment well   Patient Left in chair;with call bell/phone within reach;with chair alarm set;Other (comment)(family stepped out of room during session)   Nurse Communication          Time: 1610-9604 OT Time Calculation (min): 21 min  Charges: OT General Charges $OT Visit: 1 Visit OT Treatments $Self Care/Home Management : 8-22 mins  Raynald Kemp, OT Acute Rehabilitation Services Pager: [phone] Office: [phone]    Pilar Grammes 07/02/2018, 1:27 PM

## 2018-07-02 NOTE — Progress Notes (Addendum)
[facility] [place]  Subjective  - Alert and oriented to self not place.   Objective (!) 101/48 (!) 46 99.6 F (37.6 C) (Axillary) 14 99%  Intake/Output Summary (Last 24 hours) at 07/02/2018 0704 Last data filed at 07/02/2018 8638 Gross per 24 hour  Intake 2247.04 ml  Output 1676 ml  Net 571.04 ml    Moving all 4 extremities Right groin soft without hematoma Heart SB Lungs non labored breathing   Assessment/Planning: POD # 4 Right carotid angiogram, right carotid angioplasty and stenting 9 x 7 x 30 XACT  Continues to wear C collar post laminectomy and fusion Continue aspirin and Plavix Plan for discharge to SNF   Ian Ramos 07/02/2018 7:04 AM -- Agree with above.  SNF when available.  Needs follow up 10-14 days with carotid duplex.  Fabienne Bruns, MD [facility] [place] Office: [phone] Pager: [phone]  Laboratory Lab Results: No results for input(s): WBC, HGB, HCT, PLT in the last 72 hours. BMET No results for input(s): NA, K, CL, CO2, GLUCOSE, BUN, CREATININE, CALCIUM in the last 72 hours.  COAG No results found for: INR, PROTIME No results found for: PTT

## 2018-07-02 NOTE — Progress Notes (Signed)
  Speech Language Pathology Treatment: Dysphagia  Patient Details Name: [name] MRN: [id number] DOB: [birth year] Today's Date: 07/02/2018 Time: 8502-7741 SLP Time Calculation (min) (ACUTE ONLY): 20 min  Assessment / Plan / Recommendation Clinical Impression  Pt was alert and oriented to self but required max visual and semantic cues to orient to place. SLP reminded pt of compensatory strategies he can use to assist with orientation to place. Pt accepted nectar thick PO without overt s/s aspiration, although he required max verbal cues to use compensatory strategies (small sips and bites) throughout the session. Given presentation of audible cough immediately after airway intrusion during previous instrumental swallow assessment, clinician presented upgraded trial with upgraded thin texture and pt exhibited throat clear noted X2. Therefore, recommend continue current D2 (minced) diet with nectar thick, and ST will repeat instrumental MBS exam to determine safety with diet advancement tomorrow.    HPI HPI: [name] is a 72 y.o. male with medical history significant for chronic kidney disease stage III, neuromuscular disorder, cervical stenosis with myelopathy status post cervical laminectomy on 05/23/2018, presented to the emergency department for evaluation of chest pain. Post cardiac catheterization with lethargy and altered mental status changes. MRI showed subcentimeter acute early subacute infarctions, 2 in the left superior frontal lobe and 2 in the right posterior lateral parietal lobe. Barium esophagram 05/30/18 moderate esophageal dysmotility. Pt was intubated 06/09/18-06/12/18; had FEES 06/13/18 with findings of moderate pharyngeal dysphagia, dys 2/honey thick liquids was recommended. On 06/20/18 pt admitted to CIR, working on cognition and had progressed to dys 2/nectar thick liquids. Readmitted to acute 06/27/18 for transfemoral right carotid stent.       SLP Plan  Continue with  current plan of care       Recommendations  Diet recommendations: Dysphagia 2 (fine chop);Nectar-thick liquid Liquids provided via: Cup Medication Administration: Crushed with puree Supervision: Staff to assist with self feeding;Full supervision/cueing for compensatory strategies Compensations: Minimize environmental distractions;Slow rate;Small sips/bites;Lingual sweep for clearance of pocketing Postural Changes and/or Swallow Maneuvers: Seated upright 90 degrees                Oral Care Recommendations: Oral care BID Follow up Recommendations: Skilled Nursing facility SLP Visit Diagnosis: Dysphagia, pharyngeal phase (R13.13);Cognitive communication deficit (R41.841) Attention and concentration deficit following: Cerebral infarction Plan: Continue with current plan of care       GO          Suzzette Righter, Student SLP       Suzzette Righter 07/02/2018, 1:24 PM

## 2018-07-02 NOTE — Progress Notes (Signed)
Clinical Social Worker following for support and discharge needs. Facility contacted CSW and stated they still have not yet obtained authorization through patient insurance.  Marrianne Mood, MSW,  [employer] [phone]

## 2018-07-03 ENCOUNTER — Inpatient Hospital Stay (HOSPITAL_COMMUNITY): Payer: BLUE CROSS/BLUE SHIELD

## 2018-07-03 NOTE — Progress Notes (Signed)
  Speech Language Pathology  Patient Details Name: NAYTHEN FRARY MRN: [id number] DOB: 1946/02/12 Today's Date: 07/03/2018 Time: 3818-2993 SLP Time Calculation (min) (ACUTE ONLY): 11 min   MBS completed and full documentation to follow. Orders upgraded to regular texture and thin liquids.       Breck Coons Stewart.Ed Nurse, children's [phone] Office [phone]

## 2018-07-03 NOTE — Progress Notes (Signed)
Clinical Social Worker facilitated patient discharge including contacting patient family and facility to confirm patient discharge plans.  Clinical information faxed to facility and family agreeable with plan.  CSW arranged ambulance transport via [facility] to [facility] .  RN to call [phone] (pt will go in [address]) for report prior to discharge. [facility] has been contacted and transport set for 12 pm pick up.  Clinical Social Worker will sign off for now as social work intervention is no longer needed. Please consult [place] again if new need arises.  Marrianne Mood, MSW, [employer] [phone]

## 2018-07-03 NOTE — Progress Notes (Signed)
Modified Barium Swallow Progress Note  Patient Details  Name: Ian Ramos MRN: [id number] Date of Birth: 1946/08/29  Today's Date: 07/03/2018  Modified Barium Swallow completed.  Full report located under Chart Review in the Imaging Section.  Brief recommendations include the following:  Clinical Impression  Although mild orophrayngeal impairments noted during today's MBSS, pt presented with markedly improved timely functional swallow and overall effctive airway protection. Pharyngeal impairement were limited to flash trace penetration X2 with thin. Oral phase impairments marked by mild lingual pumping and reduced posterior propulsion with regular texture, and premature spillage to the valleculae of thin during transit of dual consistency (pill with thin), due to decreased bolus cohesion/formation. Multiple swallows were effective in deglutation of dual consistency without airway intrusion. Brief esophogeal scan did not suggest any gross abnormalities, however MBSS does not diagnose beyond the upper esophageal sphincter. SLP emphasized importance of pt's continued use of safe swallow strategies to prevent aspiration. Recommend pt upgrade to regular diet, thin liquids, and full supervision to encourage slow, small bites and sips in a minimally distracting environment. ST recommended in next venue of care to provide treatment with diet safety and efficiency.    Swallow Evaluation Recommendations       SLP Diet Recommendations: Regular solids;Thin liquid   Liquid Administration via: Straw;Cup   Medication Administration: Whole meds with liquid   Supervision: Staff to assist with self feeding;Full supervision/cueing for compensatory strategies   Compensations: Minimize environmental distractions;Slow rate;Small sips/bites;Lingual sweep for clearance of pocketing   Postural Changes: Seated upright at 90 degrees   Oral Care Recommendations: Oral care BID        Royce Macadamia 07/03/2018,1:45 PM   Breck Coons [place].Ed Nurse, children's [phone] Office [phone]

## 2018-07-03 NOTE — Progress Notes (Addendum)
[facility]   Subjective  - Alert to person, not place.   Objective (!) 153/71 (!) 58 97.7 F (36.5 C) (Axillary) 13 96%  Intake/Output Summary (Last 24 hours) at 07/03/2018 0754 Last data filed at 07/03/2018 0600 Gross per 24 hour  Intake 2298.95 ml  Output 1780 ml  Net 518.95 ml    Moving all 4 extremities, palpable radial pulses Right groin soft without hematoma Heart RRR Lungs non labored breathing  Assessment/Planning: POD # 5 Rightcarotid angiogram, right carotid angioplasty and stenting 9 x 7 x 30 XACT  Stable discharge disposition from a vascular stand point continue aspirin and Plavix daily Plan for SNF pending insurance approval Follow up 10-14 days with carotid duplex Speech therapy evaluation 06/30/2018 Recommendations  Diet recommendations: Nectar-thick liquid(clear liquids per MD, nectar thick) Liquids provided via: Cup Medication Administration: Crushed with puree Supervision: Staff to assist with self feeding;Full supervision/cueing for compensatory strategies Compensations: Minimize environmental distractions;Slow rate;Small sips/bites;Lingual sweep for clearance of pocketing Postural Changes and/or Swallow Maneuvers: Seated upright 90 degrees    Mosetta Pigeon 07/03/2018 7:54 AM --  Agree with above.  No change. Stop IV fluid saline lock. Day number 5 waiting on disposition.  Pt has been medically ready for d/c since Sept 29 Hopefully d/c today  Fabienne Bruns, MD [facility] [place] Office: [phone] Pager: [phone]

## 2018-07-09 DIAGNOSIS — E785 Hyperlipidemia, unspecified: Secondary | ICD-10-CM | POA: Diagnosis not present

## 2018-07-09 DIAGNOSIS — E1169 Type 2 diabetes mellitus with other specified complication: Secondary | ICD-10-CM | POA: Diagnosis not present

## 2018-07-09 DIAGNOSIS — I10 Essential (primary) hypertension: Secondary | ICD-10-CM | POA: Diagnosis not present

## 2018-07-09 DIAGNOSIS — I639 Cerebral infarction, unspecified: Secondary | ICD-10-CM | POA: Diagnosis not present

## 2018-07-09 DIAGNOSIS — G959 Disease of spinal cord, unspecified: Secondary | ICD-10-CM | POA: Diagnosis not present

## 2018-07-09 DIAGNOSIS — I5022 Chronic systolic (congestive) heart failure: Secondary | ICD-10-CM | POA: Diagnosis not present

## 2018-07-09 DIAGNOSIS — I2510 Atherosclerotic heart disease of native coronary artery without angina pectoris: Secondary | ICD-10-CM | POA: Diagnosis not present

## 2018-07-09 DIAGNOSIS — I6521 Occlusion and stenosis of right carotid artery: Secondary | ICD-10-CM | POA: Diagnosis not present

## 2018-07-17 ENCOUNTER — Inpatient Hospital Stay (HOSPITAL_COMMUNITY): Admit: 2018-07-17 | Payer: BLUE CROSS/BLUE SHIELD

## 2018-07-17 ENCOUNTER — Encounter: Payer: BLUE CROSS/BLUE SHIELD | Admitting: Vascular Surgery

## 2018-08-04 NOTE — Progress Notes (Signed)
[facility] [address] [place]. [place] [address] [phone]       OFFICE FOLLOW UP NOTE  Mr. MCCARTNEY CHUBA Date of Birth:  1946/02/06 Medical Record Number:  [id number]   Reason for Referral:  hospital stroke follow up  CHIEF COMPLAINT:  Chief Complaint  Patient presents with  . Follow-up    Follow up for hospital pt at [facility] facility back room pt with Kendal Hymen with fiancee     HPI: KEIANDRE CYGAN is being seen today for initial visit in the office for left periventricular M1/M2 watershed, right MCA/PCA watershed and right parietal punctate infarcts likely due to hypoperfusion in the setting of high-grade stenosis right ICA with left ICA occlusion and hypotension on 06/06/2018 and. History obtained from patient, fianc and chart review. Reviewed all radiology images and labs personally.  Mr. LIN GLAZIER is a 72 y.o. male with history of cervical fusion May 23, 2018, arthritis and neuromuscular disorder who was admitted 2 days prior with chest pain who underwent cardiac catheterization who developed new onset speech difficulty, confusion, perseveration and RUE drip with R facial droop during the night postprocedure.  CT head reviewed and showed left temporal lobe infarct.  MRI brain showed left periventricular M1/M2 watershed, right MCA/PCA watershed and right parietal punctate infarcts.  MRA showed motion artifact with likely decreased left MCA and PCA flow.  Carotid Doppler showed left ICA occlusion and severe right ICA stenosis 80 to 99%.  2D echo showed an EF of 35 to 40%.  Infarcts likely due to hypoperfusion in the setting of high-grade stenosis right ICA and left ICA occlusion and hypotension.  Recommended for patient to undergo CAS with continued follow-up with VDS, and recommendations of avoiding hypotension with BP goal 1 30-1 50.  Patient is not on antithrombotic PTA and recommended DAPT.  LDL 93 and recommended initiating atorvastatin 80 mg  daily.  Patient was discharged to [facility] for continued therapies. On 06/27/2018, patient underwent right carotid angiogram with right carotid angioplasty and stenting which was tolerated well without complications.  Patient was then discharged to [facility] on 07/03/2018 in stable condition.  He is being seen today for hospital follow-up and is accompanied by his fiance.  He continues to reside at [facility] and rehab where he continues to undergo PT/OT/ST.  He states all stroke symptoms have resolved a fianc does state intermittent short-term memory difficulties.  He does have complaints of neck and generalized head numbness/tingling since he underwent cervical fusion in 05/2018.  Neurosurgeon is aware.  He does have follow-up appointment with vascular surgery on 08/21/2018 for repeat imaging and office visit.  He continues to take both aspirin and Plavix without side effects of bleeding or bruising.  He continues to take atorvastatin 80 mg daily without side effects myalgias.  Blood pressure today satisfactory at 125/66.  [place] is eager to [place] and was completely independent prior.  Neysa Bonito does have concerns of possible depression or anxiety but when patient questioned regarding this, he refuses any of the symptoms.  Per fianc, prior to his recent admission he was one to never take medications except for an occasional ibuprofen. No further concerns at this time.  Denies new or worsening stroke/TIA symptoms.    ROS:   14 system review of systems performed and negative with exception of numbness, weakness  PMH:  Past Medical History:  Diagnosis Date  . Arthritis   . Neuromuscular  disorder (HCC)   . Stroke [facility])     PSH:  Past Surgical History:  Procedure Laterality Date  . CAROTID PTA/STENT INTERVENTION N/A 06/27/2018   Procedure: CAROTID PTA/STENT INTERVENTION;  Surgeon: Sherren Kerns, MD;  Location: [facility] INVASIVE CV LAB;  Service: Cardiovascular;   Laterality: N/A;  . LEFT HEART CATH AND CORONARY ANGIOGRAPHY N/A 06/05/2018   Procedure: LEFT HEART CATH AND CORONARY ANGIOGRAPHY;  Surgeon: Swaz[place], Peter M, MD;  Location: [facility] INVASIVE CV LAB;  Service: Cardiovascular;  Laterality: N/A;  . POSTERIOR CERVICAL FUSION/FORAMINOTOMY N/A 05/23/2018   Procedure: Posterior Cervical Fusion with lateral mass fixation - C1 - C6 with laminectomy;  Surgeon: Julio Sicks, MD;  Location: [facility] OR;  Service: Neurosurgery;  Laterality: N/A;    Social History:  Social History   Socioeconomic History  . Marital status: Divorced    Spouse name: Not on file  . Number of children: Not on file  . Years of education: Not on file  . Highest education level: Not on file  Occupational History  . Not on file  Social Needs  . Financial resource strain: Not on file  . Food insecurity:    Worry: Not on file    Inability: Not on file  . Transportation needs:    Medical: Not on file    Non-medical: Not on file  Tobacco Use  . Smoking status: Never Smoker  . Smokeless tobacco: Current User    Types: Chew  Substance and Sexual Activity  . Alcohol use: No    Frequency: Never  . Drug use: No  . Sexual activity: Not on file  Lifestyle  . Physical activity:    Days per week: Patient refused    Minutes per session: Patient refused  . Stress: Patient refused  Relationships  . Social connections:    Talks on phone: Not on file    Gets together: Not on file    Attends religious service: Not on file    Active member of club or organization: Not on file    Attends meetings of clubs or organizations: Not on file    Relationship status: Not on file  . Intimate partner violence:    Fear of current or ex partner: Not on file    Emotionally abused: Not on file    Physically abused: Not on file    Forced sexual activity: Not on file  Other Topics Concern  . Not on file  Social History Narrative   NA    Family History:  Family History  Problem Relation Age of  Onset  . Hypertension Other   . Heart failure Mother   . CAD Father        s/p CABG in his 30s  . Heart failure Father     Medications:   Current Outpatient Medications on File Prior to Visit  Medication Sig Dispense Refill  . acetaminophen (TYLENOL) 325 MG tablet Take 2 tablets (650 mg total) by mouth every 4 (four) hours as needed for headache or mild pain.    Marland Kitchen aspirin 81 MG chewable tablet Place 2 tablets (162 mg total) into feeding tube daily.    Marland Kitchen atorvastatin (LIPITOR) 80 MG tablet Place 1 tablet (80 mg total) into feeding tube daily at 6 PM.    . carvedilol (COREG) 3.125 MG tablet Take 1 tablet (3.125 mg total) by mouth 2 (two) times daily with a meal.    . cloNIDine (CATAPRES - DOSED IN MG/24 HR) 0.1 mg/24hr patch Place  1 patch (0.1 mg total) onto the skin every Thursday. 4 patch 12  . clopidogrel (PLAVIX) 75 MG tablet Place 1 tablet (75 mg total) into feeding tube daily.    . famotidine (PEPCID) 20 MG tablet Take 1 tablet (20 mg total) by mouth daily.    Marland Kitchen senna (SENOKOT) 8.6 MG TABS tablet Take 2 tablets (17.2 mg total) by mouth at bedtime as needed for mild constipation. 120 each 0   Current Facility-Administered Medications on File Prior to Visit  Medication Dose Route Frequency Provider Last Rate Last Dose  . etomidate (AMIDATE) injection    Anesthesia Intra-op Lucinda Dell, CRNA   12 mg at 06/09/18 1445  . succinylcholine (ANECTINE) injection    Anesthesia Intra-op Lucinda Dell, CRNA   100 mg at 06/09/18 1445    Allergies:  No Known Allergies   Physical Exam  Vitals:   08/05/18 1150  BP: 125/66  Pulse: 68  Weight: 156 lb 9.6 oz (71 kg)   Body mass index is 24.53 kg/m. No exam data present  General: well developed, well nourished, elderly Caucasian male, seated, in no evident distress Head: head normocephalic and atraumatic.   Neck: supple with no carotid or supraclavicular bruits Cardiovascular: regular rate and rhythm, no  murmurs Musculoskeletal: no deformity; limited range of motion cervical spine due to recent surgery Skin:  no rash/petichiae Vascular:  Normal pulses all extremities  Neurologic Exam Mental Status: Awake and fully alert. Oriented to place and time. Remote memory intact. Attention span, concentration and fund of knowledge appropriate.  Flat affect. Cranial Nerves: Fundoscopic exam reveals sharp disc margins. Pupils equal, briskly reactive to light. Extraocular movements full without nystagmus. Visual fields full to confrontation. Hearing intact. Facial sensation intact. Face, tongue, palate moves normally and symmetrically.  Motor: Normal bulk and tone. Normal strength in all tested extremity muscles except for mild left upper extremity weakness and decreased range of motion due to cervical fusion Sensory.: intact to touch , pinprick , position and vibratory sensation.  Coordination: Rapid alternating movements normal in all extremities. Finger-to-nose and heel-to-shin performed accurately bilaterally. Gait and Station: Arises from chair with mild difficulty. Stance is hunched.  Gait demonstrates short cautious steps with assistance of rolling walker.   Reflexes: 1+ and symmetric. Toes downgoing.    NIHSS  0 Modified Rankin  2    Diagnostic Data (Labs, Imaging, Testing)  CT HEAD WO CONTRAST 06/06/2018 IMPRESSION: 1. Subtle hypodensity involving the anterior/mid left temporal lobe, suspicious for evolving acute ischemic left MCA territory infarct. No associated hemorrhage. 2. ASPECTS is 9. 3. Chronic microvascular ischemic disease.  [name] MR MRA HEAD  06/06/2018 IMPRESSION: MRI head: 1. Diffusion and T2 FLAIR weighted sequences were acquired. The patient was unable to continue and additional sequences were not acquired. 2. Subcentimeter acute/early subacute infarctions, 2 in the left superior frontal lobe, and 2 in the right posterolateral parietal lobe. No  associated mass effect. 3. Mild chronic microvascular ischemic changes and volume loss of the brain. MRA head: 1. Extensive motion artifact. 2. Minimal flow related signal in the left cervical ICA and absent flow related signal within the petrous and cavernous segments of left ICA. Findings may represent occlusion or proximal high-grade stenosis. Age indeterminate. 3. Large anterior communicating artery providing collateral circulation to the left MCA and left PCA distributions. Decreased signal in left MCA and left PCA distribution from increased transit time. CTA of the head and neck can better assess vessel stenosis and patency.  If clinically indicated consider CT perfusion of the head to assess for brain oligemia.  ECHOCARDIOGRAM 05/29/2018 Study Conclusions - Left ventricle: The cavity size was normal. Systolic function was   moderately reduced. The estimated ejection fraction was in the   range of 35% to 40%. Diffuse hypokinesis. Doppler parameters are   consistent with abnormal left ventricular relaxation (grade 1   diastolic dysfunction). Doppler parameters are consistent with   elevated ventricular end-diastolic filling pressure. - Aortic valve: A bicuspid morphology cannot be excluded; severely   thickened, severely calcified leaflets. There was trivial   regurgitation. - Mitral valve: There was mild regurgitation. - Left atrium: The atrium was normal in size. - Right atrium: The atrium was normal in size. - Tricuspid valve: There was trivial regurgitation. - Pulmonary arteries: Systolic pressure was within the normal   range. - Inferior vena cava: The vessel was dilated. The respirophasic   diameter changes were blunted (< 50%), consistent with elevated   central venous pressure. - Pericardium, extracardiac: There was no pericardial effusion.    ASSESSMENT: DARRON STUCK is a 72 y.o. year old male here with left M1/M2 watershed, right MCA/PCA watershed and  right parietal punctate infarcts on 06/06/2018 secondary to hypoperfusion in the setting of high-grade right ICA stenosis with left ICA occlusion and hypotension.  Patient did undergo right carotid PTA/stent intervention on 06/27/2018.  Vascular risk factors include ICA stenosis/occlusion, CAD, LDL and dilated cardiomyopathy with EF 35 to 40%.   Patient is being seen today for hospital follow-up and overall has been recovering well from a stroke standpoint without residual deficit or recurring of symptoms.    PLAN: -Continue aspirin 81 mg daily and clopidogrel 75 mg daily  and atorvastatin 80 mg for secondary stroke prevention -F/u with PCP regarding your HLD and HTN management -f/u with vascular surgery on 08/21/2018 for repeat carotid ultrasound along with duration of DAPT -Continue PT/OT/ST at current facility and upon discharge, recommend home PT/OT/ST -Requested facility monitor for depression/anxiety post stroke versus situational as patient declines symptoms or treatment at this time -continue to monitor BP at home -advised to continue to stay active and maintain a healthy diet -Maintain strict control of hypertension with blood pressure goal 1 30-1 50, diabetes with hemoglobin A1c goal below 6.5% and cholesterol with LDL cholesterol (bad cholesterol) goal below 70 mg/dL. I also advised the patient to eat a healthy diet with plenty of whole grains, cereals, fruits and vegetables, exercise regularly and maintain ideal body weight.  Follow up in 6 months as requested by patient or call earlier if needed   Greater than 50% of time during this 25 minute visit was spent on counseling,explanation of diagnosis of left M1/M2 watershed, right MCA/PCA watershed and right parietal punctate infarcts, reviewing risk factor management of right ICA stenosis with right carotid stent, CAD, LDL and dilated cardiomyopathy with EF 35 to 40%, planning of further management, discussion with patient and family and  coordination of care    George Hugh, AGNP-BC  [facility] [address] [place], [place] [address]  Phone [phone] Fax [phone] Note: This document was prepared with digital dictation and possible smart phrase technology. Any transcriptional errors that result from this process are unintentional.

## 2018-08-05 ENCOUNTER — Encounter: Payer: Self-pay | Admitting: Adult Health

## 2018-08-05 ENCOUNTER — Ambulatory Visit (INDEPENDENT_AMBULATORY_CARE_PROVIDER_SITE_OTHER): Payer: BLUE CROSS/BLUE SHIELD | Admitting: Adult Health

## 2018-08-05 VITALS — BP 125/66 | HR 68 | Wt 156.6 lb

## 2018-08-05 DIAGNOSIS — I63511 Cerebral infarction due to unspecified occlusion or stenosis of right middle cerebral artery: Secondary | ICD-10-CM

## 2018-08-05 NOTE — Patient Instructions (Signed)
Continue aspirin 81 mg daily and clopidogrel 75 mg daily  and lipitor  for secondary stroke prevention  Continue to follow up with PCP regarding cholesterol and blood pressure management   Follow up with vascular surgery as scheduled for repeat imaging along with length of aspirin and plavix therapy  Continue to monitor blood pressure at home  Maintain strict control of hypertension with blood pressure goal between 120-150, diabetes with hemoglobin A1c goal below 6.5% and cholesterol with LDL cholesterol (bad cholesterol) goal below 70 mg/dL. I also advised the patient to eat a healthy diet with plenty of whole grains, cereals, fruits and vegetables, exercise regularly and maintain ideal body weight.  Followup in the future with me in 6 months or call earlier if needed       Thank you for coming to see [place] at [facility]. I hope we have been able to provide you high quality care today.  You may receive a patient satisfaction survey over the next few weeks. We would appreciate your feedback and comments so that we may continue to improve ourselves and the health of our patients.

## 2018-08-06 NOTE — Progress Notes (Signed)
I agree with the above plan 

## 2018-08-18 DIAGNOSIS — H04123 Dry eye syndrome of bilateral lacrimal glands: Secondary | ICD-10-CM | POA: Diagnosis not present

## 2018-08-18 DIAGNOSIS — H43813 Vitreous degeneration, bilateral: Secondary | ICD-10-CM | POA: Diagnosis not present

## 2018-08-18 DIAGNOSIS — H2513 Age-related nuclear cataract, bilateral: Secondary | ICD-10-CM | POA: Diagnosis not present

## 2018-08-18 DIAGNOSIS — H02839 Dermatochalasis of unspecified eye, unspecified eyelid: Secondary | ICD-10-CM | POA: Diagnosis not present

## 2018-08-21 ENCOUNTER — Encounter: Payer: Self-pay | Admitting: Vascular Surgery

## 2018-08-21 ENCOUNTER — Ambulatory Visit (INDEPENDENT_AMBULATORY_CARE_PROVIDER_SITE_OTHER): Payer: BLUE CROSS/BLUE SHIELD | Admitting: Vascular Surgery

## 2018-08-21 ENCOUNTER — Ambulatory Visit (HOSPITAL_COMMUNITY)
Admission: RE | Admit: 2018-08-21 | Discharge: 2018-08-21 | Disposition: A | Discharge status: Home / Self Care | Payer: BLUE CROSS/BLUE SHIELD | Source: Ambulatory Visit | Attending: Vascular Surgery | Admitting: Vascular Surgery

## 2018-08-21 ENCOUNTER — Other Ambulatory Visit: Payer: Self-pay

## 2018-08-21 VITALS — BP 100/55 | HR 65 | Temp 97.6°F | Resp 16 | Ht 67.0 in | Wt 164.0 lb

## 2018-08-21 DIAGNOSIS — I6521 Occlusion and stenosis of right carotid artery: Secondary | ICD-10-CM

## 2018-08-21 DIAGNOSIS — I6523 Occlusion and stenosis of bilateral carotid arteries: Secondary | ICD-10-CM

## 2018-08-21 NOTE — Progress Notes (Signed)
Patient is a 72 year old male who is status post transfemoral right internal carotid artery stenting June 27, 2018.  Transfemoral stenting was performed due to the fact the patient had had recent cervical spine surgery and was not able to rotate the neck.  This was done for asymptomatic lesion.  The patient had had a prior stroke before the procedure.  Many of his symptoms after his stroke or cognitive related.  He has recovered most of his cognitive function.  He has no recollection of events that occurred during his hospital stay.  He denies any new symptoms of TIA amaurosis or stroke.  He is currently on Plavix aspirin and a statin.  He still has some nerve deficits in his left upper extremity secondary to nerve root compression that is being followed by Dr. Dutch Quint.  Current Outpatient Medications on File Prior to Visit  Medication Sig Dispense Refill  . acetaminophen (TYLENOL) 325 MG tablet Take 2 tablets (650 mg total) by mouth every 4 (four) hours as needed for headache or mild pain.    Marland Kitchen aspirin 81 MG chewable tablet Place 2 tablets (162 mg total) into feeding tube daily.    Marland Kitchen atorvastatin (LIPITOR) 80 MG tablet Place 1 tablet (80 mg total) into feeding tube daily at 6 PM.    . carvedilol (COREG) 3.125 MG tablet Take 1 tablet (3.125 mg total) by mouth 2 (two) times daily with a meal.    . cloNIDine (CATAPRES - DOSED IN MG/24 HR) 0.1 mg/24hr patch Place 1 patch (0.1 mg total) onto the skin every Thursday. 4 patch 12  . clopidogrel (PLAVIX) 75 MG tablet Place 1 tablet (75 mg total) into feeding tube daily.    . famotidine (PEPCID) 20 MG tablet Take 1 tablet (20 mg total) by mouth daily.    Marland Kitchen senna (SENOKOT) 8.6 MG TABS tablet Take 2 tablets (17.2 mg total) by mouth at bedtime as needed for mild constipation. 120 each 0   Current Facility-Administered Medications on File Prior to Visit  Medication Dose Route Frequency Provider Last Rate Last Dose  . etomidate (AMIDATE) injection    Anesthesia  Intra-op Lucinda Dell, CRNA   12 mg at 06/09/18 1445  . succinylcholine (ANECTINE) injection    Anesthesia Intra-op Lucinda Dell, CRNA   100 mg at 06/09/18 1445   Review of systems: He denies shortness of breath.  He denies chest pain.  Family History  Problem Relation Age of Onset  . Hypertension Other   . Heart failure Mother   . CAD Father        s/p CABG in his 14s  . Heart failure Father     Social History   Socioeconomic History  . Marital status: Divorced    Spouse name: Not on file  . Number of children: Not on file  . Years of education: Not on file  . Highest education level: Not on file  Occupational History  . Not on file  Social Needs  . Financial resource strain: Not on file  . Food insecurity:    Worry: Not on file    Inability: Not on file  . Transportation needs:    Medical: Not on file    Non-medical: Not on file  Tobacco Use  . Smoking status: Never Smoker  . Smokeless tobacco: Current User    Types: Chew  Substance and Sexual Activity  . Alcohol use: No    Frequency: Never  . Drug use: No  . Sexual activity:  Not on file  Lifestyle  . Physical activity:    Days per week: Patient refused    Minutes per session: Patient refused  . Stress: Patient refused  Relationships  . Social connections:    Talks on phone: Not on file    Gets together: Not on file    Attends religious service: Not on file    Active member of club or organization: Not on file    Attends meetings of clubs or organizations: Not on file    Relationship status: Not on file  . Intimate partner violence:    Fear of current or ex partner: Not on file    Emotionally abused: Not on file    Physically abused: Not on file    Forced sexual activity: Not on file  Other Topics Concern  . Not on file  Social History Narrative   NA     Physical exam:  Vitals:   08/21/18 1613 08/21/18 1617  BP: 93/61 (!) 100/55  Pulse: 68 65  Resp: 16   Temp: 97.6 F (36.4 C)    TempSrc: Oral   SpO2: 98%   Weight: 164 lb (74.4 kg)   Height: 5\' 7"  (1.702 m)     Neck: Left-sided carotid bruit no right bruit  Chest: Clear to auscultation bilaterally  Cardiac: Regular rate and rhythm  Neuro: Symmetric upper extremity lower extremity motor strength which is 5/5 the patient does have trouble abducting the shoulder and flexing the left biceps which he says is secondary to nerve root compression.  No facial asymmetry  Data: Patient had a carotid duplex exam today which showed the right internal carotid artery stent is patent.  Left internal carotid artery is chronically occluded.  Velocities were slightly increased in the distal stent to 12/71.  He had normal flow in the subclavian arteries and vertebral arteries with antegrade flow.  I reviewed and interpreted the study.  Assessment: Continue to recover from preoperative stroke no neuro events since right internal carotid artery stenting  Plan: Continue Plavix aspirin statin indefinitely.  Patient will have a follow-up carotid duplex exam and see our nurse practitioner in 6 months.  Fabienne Bruns, MD [facility] Office: [phone] Pager: [phone]

## 2018-08-25 ENCOUNTER — Ambulatory Visit (INDEPENDENT_AMBULATORY_CARE_PROVIDER_SITE_OTHER): Payer: BLUE CROSS/BLUE SHIELD | Admitting: Family Medicine

## 2018-08-25 ENCOUNTER — Encounter: Payer: Self-pay | Admitting: Family Medicine

## 2018-08-25 VITALS — BP 100/70 | Ht 67.0 in | Wt 164.0 lb

## 2018-08-25 DIAGNOSIS — I959 Hypotension, unspecified: Secondary | ICD-10-CM

## 2018-08-25 DIAGNOSIS — E861 Hypovolemia: Secondary | ICD-10-CM | POA: Insufficient documentation

## 2018-08-25 DIAGNOSIS — D649 Anemia, unspecified: Secondary | ICD-10-CM | POA: Diagnosis not present

## 2018-08-25 LAB — CBC
HCT: 35.8 % — ABNORMAL LOW (ref 39.0–52.0)
Hemoglobin: 11.8 g/dL — ABNORMAL LOW (ref 13.0–17.0)
MCHC: 32.8 g/dL (ref 30.0–36.0)
MCV: 83.6 fl (ref 78.0–100.0)
Platelets: 199 10*3/uL (ref 150.0–400.0)
RBC: 4.29 Mil/uL (ref 4.22–5.81)
RDW: 15.7 % — AB (ref 11.5–15.5)
WBC: 7.1 10*3/uL (ref 4.0–10.5)

## 2018-08-25 LAB — VITAMIN B12: Vitamin B-12: 260 pg/mL (ref 211–911)

## 2018-08-25 MED ORDER — CLONIDINE HCL 0.1 MG PO TABS: 0.1000 mg | ORAL_TABLET | Freq: Two times a day (BID) | ORAL | 0 refills | Status: DC

## 2018-08-25 MED ORDER — CARVEDILOL 3.125 MG PO TABS: 3.1250 mg | ORAL_TABLET | Freq: Two times a day (BID) | ORAL | 0 refills | Status: DC

## 2018-08-25 NOTE — Patient Instructions (Signed)
Remove clonidine patch today.  Start clonidine pills, one twice each day tomorrow.  Take coreg twice daily.

## 2018-08-25 NOTE — Progress Notes (Signed)
Established Patient Office Visit  Subjective:  Patient ID: Ian Ramos, male    DOB: [birth year]  Age: 72 y.o. MRN: [id number]  CC:  Chief Complaint  Patient presents with  . Follow-up    HPI Ian Ramos presents for follow up post short-term rehab.  For follow-up status post fusion of C2-C3, CVA, carotid endarterectomy.  He is extremely fatigued.  He is currently taking carvedilol 3.2 mg daily.  He is on the 0.1 mg Catapres weekly patch.  He is scheduled to start physical therapy soon.  He is accompanied by his significant other.  Patient with a hemoglobin of 8.8 on 929.  Apparently he received a transfusion.  No follow-up CBC seen in the record.  Past Medical History:  Diagnosis Date  . Arthritis   . Neuromuscular disorder (HCC)   . Stroke [facility])     Past Surgical History:  Procedure Laterality Date  . CAROTID PTA/STENT INTERVENTION N/A 06/27/2018   Procedure: CAROTID PTA/STENT INTERVENTION;  Surgeon: Sherren Kerns, MD;  Location: [facility] INVASIVE CV LAB;  Service: Cardiovascular;  Laterality: N/A;  . LEFT HEART CATH AND CORONARY ANGIOGRAPHY N/A 06/05/2018   Procedure: LEFT HEART CATH AND CORONARY ANGIOGRAPHY;  Surgeon: Swaziland, Peter M, MD;  Location: [facility] INVASIVE CV LAB;  Service: Cardiovascular;  Laterality: N/A;  . POSTERIOR CERVICAL FUSION/FORAMINOTOMY N/A 05/23/2018   Procedure: Posterior Cervical Fusion with lateral mass fixation - C1 - C6 with laminectomy;  Surgeon: Julio Sicks, MD;  Location: [facility] OR;  Service: Neurosurgery;  Laterality: N/A;    Family History  Problem Relation Age of Onset  . Hypertension Other   . Heart failure Mother   . CAD Father        s/p CABG in his 91s  . Heart failure Father     Social History   Socioeconomic History  . Marital status: Divorced    Spouse name: Not on file  . Number of children: Not on file  . Years of education: Not on file  . Highest education level: Not on file  Occupational History  . Not on file  Social  Needs  . Financial resource strain: Not on file  . Food insecurity:    Worry: Not on file    Inability: Not on file  . Transportation needs:    Medical: Not on file    Non-medical: Not on file  Tobacco Use  . Smoking status: Never Smoker  . Smokeless tobacco: Current User    Types: Chew  Substance and Sexual Activity  . Alcohol use: No    Frequency: Never  . Drug use: No  . Sexual activity: Not on file  Lifestyle  . Physical activity:    Days per week: Patient refused    Minutes per session: Patient refused  . Stress: Patient refused  Relationships  . Social connections:    Talks on phone: Not on file    Gets together: Not on file    Attends religious service: Not on file    Active member of club or organization: Not on file    Attends meetings of clubs or organizations: Not on file    Relationship status: Not on file  . Intimate partner violence:    Fear of current or ex partner: Not on file    Emotionally abused: Not on file    Physically abused: Not on file    Forced sexual activity: Not on file  Other Topics Concern  . Not on file  Social History Narrative   NA    Outpatient Medications Prior to Visit  Medication Sig Dispense Refill  . acetaminophen (TYLENOL) 325 MG tablet Take 2 tablets (650 mg total) by mouth every 4 (four) hours as needed for headache or mild pain.    Marland Kitchen aspirin 81 MG chewable tablet Place 2 tablets (162 mg total) into feeding tube daily.    Marland Kitchen atorvastatin (LIPITOR) 80 MG tablet Place 1 tablet (80 mg total) into feeding tube daily at 6 PM.    . cloNIDine (CATAPRES - DOSED IN MG/24 HR) 0.1 mg/24hr patch Place 1 patch (0.1 mg total) onto the skin every Thursday. 4 patch 12  . clopidogrel (PLAVIX) 75 MG tablet Place 1 tablet (75 mg total) into feeding tube daily.    . famotidine (PEPCID) 20 MG tablet Take 1 tablet (20 mg total) by mouth daily.    Marland Kitchen senna (SENOKOT) 8.6 MG TABS tablet Take 2 tablets (17.2 mg total) by mouth at bedtime as needed for  mild constipation. 120 each 0  . carvedilol (COREG) 3.125 MG tablet Take 1 tablet (3.125 mg total) by mouth 2 (two) times daily with a meal.     Facility-Administered Medications Prior to Visit  Medication Dose Route Frequency Provider Last Rate Last Dose  . etomidate (AMIDATE) injection    Anesthesia Intra-op Lucinda Dell, CRNA   12 mg at 06/09/18 1445  . succinylcholine (ANECTINE) injection    Anesthesia Intra-op Lucinda Dell, CRNA   100 mg at 06/09/18 1445    No Known Allergies  ROS Review of Systems  Constitutional: Negative.   HENT: Negative.   Eyes: Negative for photophobia and visual disturbance.  Respiratory: Negative.   Cardiovascular: Negative.   Gastrointestinal: Negative.   Neurological: Positive for numbness. Negative for weakness.  Hematological: Does not bruise/bleed easily.  Psychiatric/Behavioral: Negative.       Objective:    Physical Exam  Constitutional: He is oriented to person, place, and time. He appears well-developed and well-nourished. No distress.  HENT:  Head: Normocephalic and atraumatic.  Right Ear: External ear normal.  Left Ear: External ear normal.  Mouth/Throat: Oropharynx is clear and moist. No oropharyngeal exudate.  Eyes: Pupils are equal, round, and reactive to light. Conjunctivae are normal. Right eye exhibits no discharge. Left eye exhibits no discharge. No scleral icterus.  Neck: Neck supple. No JVD present. No tracheal deviation present. No thyromegaly present.  Cardiovascular: Normal rate, regular rhythm and normal heart sounds.  Pulmonary/Chest: Effort normal and breath sounds normal. No stridor.  Abdominal: Bowel sounds are normal.  Lymphadenopathy:    He has no cervical adenopathy.  Neurological: He is alert and oriented to person, place, and time.  Skin: Skin is warm. He is not diaphoretic. There is pallor.  Psychiatric: He has a normal mood and affect. His behavior is normal.    BP 100/70   Ht 5\' 7"  (1.702 m)    Wt 164 lb (74.4 kg)   BMI 25.69 kg/m  Wt Readings from Last 3 Encounters:  08/25/18 164 lb (74.4 kg)  08/21/18 164 lb (74.4 kg)  08/05/18 156 lb 9.6 oz (71 kg)   BP Readings from Last 3 Encounters:  08/25/18 100/70  08/21/18 (!) 100/55  08/05/18 125/66   Health Maintenance Due  Topic Date Due  . Hepatitis C Screening  1946/03/05  . TETANUS/TDAP  08/05/1963  . COLONOSCOPY  08/04/1994    There are no preventive care reminders to display for this patient.  Lab  Results  Component Value Date   TSH 3.204 06/04/2018   Lab Results  Component Value Date   WBC 9.9 06/29/2018   HGB 8.8 (L) 06/29/2018   HCT 26.9 (L) 06/29/2018   MCV 85.7 06/29/2018   PLT 217 06/29/2018   Lab Results  Component Value Date   NA 136 06/28/2018   K 4.0 06/28/2018   CO2 22 06/28/2018   GLUCOSE 89 06/28/2018   BUN 18 06/28/2018   CREATININE 1.31 (H) 06/28/2018   BILITOT 0.6 06/20/2018   ALKPHOS 50 06/20/2018   AST 20 06/20/2018   ALT 27 06/20/2018   PROT 5.0 (L) 06/20/2018   ALBUMIN 2.2 (L) 06/20/2018   CALCIUM 8.1 (L) 06/28/2018   ANIONGAP 7 06/28/2018   Lab Results  Component Value Date   CHOL 155 06/07/2018   Lab Results  Component Value Date   HDL 31 (L) 06/07/2018   Lab Results  Component Value Date   LDLCALC 93 06/07/2018   Lab Results  Component Value Date   TRIG 154 (H) 06/07/2018   Lab Results  Component Value Date   CHOLHDL 5.0 06/07/2018   Lab Results  Component Value Date   HGBA1C 6.1 (H) 06/06/2018      Assessment & Plan:   Problem List Items Addressed This Visit      Cardiovascular and Mediastinum   Hypotension - Primary   Relevant Medications   cloNIDine (CATAPRES) 0.1 MG tablet   carvedilol (COREG) 3.125 MG tablet   Other Relevant Orders   CBC   Vitamin B12   Iron, TIBC and Ferritin Panel     Other   Anemia   Relevant Orders   CBC   Vitamin B12   Iron, TIBC and Ferritin Panel      Meds ordered this encounter  Medications  .  cloNIDine (CATAPRES) 0.1 MG tablet    Sig: Take 1 tablet (0.1 mg total) by mouth 2 (two) times daily.    Dispense:  60 tablet    Refill:  0  . carvedilol (COREG) 3.125 MG tablet    Sig: Take 1 tablet (3.125 mg total) by mouth 2 (two) times daily with a meal.    Dispense:  60 tablet    Refill:  0   Patient is hypotensive and this is a complex situation.  He is both on beta-blocker and alpha blockade.  He is on the lowest dose of both medications.  Do not feel comfortable discontinuing either medicine right now.   Will switch from the the 7-day patch to clonidine 0.1 twice daily.  Patient will follow-up in 1 week.  Encourage hydration.  Hospital chart did show a hemoglobin of 8.8.  He did receive a transfusion.  He has marked pallor today.  CBC with iron studies pending today. Follow-up: Return in about 1 week (around 09/01/2018).

## 2018-08-26 ENCOUNTER — Other Ambulatory Visit: Payer: Self-pay

## 2018-08-26 LAB — IRON,TIBC AND FERRITIN PANEL
%SAT: 16 % — AB (ref 20–48)
Ferritin: 69 ng/mL (ref 24–380)
IRON: 44 ug/dL — AB (ref 50–180)
TIBC: 274 ug/dL (ref 250–425)

## 2018-08-26 MED ORDER — B COMPLEX-B12 PO TABS: ORAL_TABLET | ORAL | 1 refills | Status: DC

## 2018-08-26 MED ORDER — FERROUS SULFATE 325 (65 FE) MG PO TABS: 325.0000 mg | ORAL_TABLET | Freq: Every day | ORAL | 1 refills | Status: DC

## 2018-08-26 NOTE — Addendum Note (Signed)
Addended by: Andrez Grime on: 08/26/2018 08:11 AM   Modules accepted: Orders

## 2018-09-01 ENCOUNTER — Ambulatory Visit (INDEPENDENT_AMBULATORY_CARE_PROVIDER_SITE_OTHER): Payer: BLUE CROSS/BLUE SHIELD | Admitting: Family Medicine

## 2018-09-01 ENCOUNTER — Encounter: Payer: Self-pay | Admitting: Family Medicine

## 2018-09-01 VITALS — BP 118/70 | HR 59 | Ht 67.0 in

## 2018-09-01 DIAGNOSIS — R5383 Other fatigue: Secondary | ICD-10-CM | POA: Diagnosis not present

## 2018-09-01 DIAGNOSIS — D649 Anemia, unspecified: Secondary | ICD-10-CM

## 2018-09-01 DIAGNOSIS — E611 Iron deficiency: Secondary | ICD-10-CM | POA: Diagnosis not present

## 2018-09-01 DIAGNOSIS — E538 Deficiency of other specified B group vitamins: Secondary | ICD-10-CM | POA: Diagnosis not present

## 2018-09-01 DIAGNOSIS — I959 Hypotension, unspecified: Secondary | ICD-10-CM

## 2018-09-01 DIAGNOSIS — R5382 Chronic fatigue, unspecified: Secondary | ICD-10-CM | POA: Insufficient documentation

## 2018-09-01 MED ORDER — CARVEDILOL 3.125 MG PO TABS: 3.1250 mg | ORAL_TABLET | Freq: Two times a day (BID) | ORAL | 0 refills | Status: DC

## 2018-09-01 NOTE — Progress Notes (Signed)
Established Patient Office Visit  Subjective:  Patient ID: Ian Ramos, male    DOB: Jul 12, 1946  Age: 72 y.o. MRN: [id number]  CC:  Chief Complaint  Patient presents with  . Follow-up    HPI [name] presents for follow up of his blood pressure. His blood pressure has been running pretty normal aside from a high reading of 141/68 from yesterday. He is taking his medications as directed.  Status post discontinuation of the clonidine patch and the initiation of clonidine 0.1 mg twice daily, patient's blood pressures been running in the 1 20-1 30 over 60s range.  He has been taking the Coreg daily.  He has started the B12 and iron therapy.  He continues to walk around his house using his walker.  Past Medical History:  Diagnosis Date  . Arthritis   . Neuromuscular disorder (HCC)   . Stroke [facility])     Past Surgical History:  Procedure Laterality Date  . CAROTID PTA/STENT INTERVENTION N/A 06/27/2018   Procedure: CAROTID PTA/STENT INTERVENTION;  Surgeon: Sherren Kerns, MD;  Location: [facility] INVASIVE CV LAB;  Service: Cardiovascular;  Laterality: N/A;  . LEFT HEART CATH AND CORONARY ANGIOGRAPHY N/A 06/05/2018   Procedure: LEFT HEART CATH AND CORONARY ANGIOGRAPHY;  Surgeon: Swaziland, Peter M, MD;  Location: [facility] INVASIVE CV LAB;  Service: Cardiovascular;  Laterality: N/A;  . POSTERIOR CERVICAL FUSION/FORAMINOTOMY N/A 05/23/2018   Procedure: Posterior Cervical Fusion with lateral mass fixation - C1 - C6 with laminectomy;  Surgeon: Julio Sicks, MD;  Location: [facility] OR;  Service: Neurosurgery;  Laterality: N/A;    Family History  Problem Relation Age of Onset  . Hypertension Other   . Heart failure Mother   . CAD Father        s/p CABG in his 57s  . Heart failure Father     Social History   Socioeconomic History  . Marital status: Divorced    Spouse name: Not on file  . Number of children: Not on file  . Years of education: Not on file  . Highest education level: Not on  file  Occupational History  . Not on file  Social Needs  . Financial resource strain: Not on file  . Food insecurity:    Worry: Not on file    Inability: Not on file  . Transportation needs:    Medical: Not on file    Non-medical: Not on file  Tobacco Use  . Smoking status: Never Smoker  . Smokeless tobacco: Current User    Types: Chew  Substance and Sexual Activity  . Alcohol use: No    Frequency: Never  . Drug use: No  . Sexual activity: Not on file  Lifestyle  . Physical activity:    Days per week: Patient refused    Minutes per session: Patient refused  . Stress: Patient refused  Relationships  . Social connections:    Talks on phone: Not on file    Gets together: Not on file    Attends religious service: Not on file    Active member of club or organization: Not on file    Attends meetings of clubs or organizations: Not on file    Relationship status: Not on file  . Intimate partner violence:    Fear of current or ex partner: Not on file    Emotionally abused: Not on file    Physically abused: Not on file    Forced sexual activity: Not on file  Other  Topics Concern  . Not on file  Social History Narrative   NA    Outpatient Medications Prior to Visit  Medication Sig Dispense Refill  . acetaminophen (TYLENOL) 325 MG tablet Take 2 tablets (650 mg total) by mouth every 4 (four) hours as needed for headache or mild pain.    Marland Kitchen aspirin 81 MG chewable tablet Place 2 tablets (162 mg total) into feeding tube daily.    Marland Kitchen atorvastatin (LIPITOR) 80 MG tablet Place 1 tablet (80 mg total) into feeding tube daily at 6 PM.    . B Complex Vitamins (B COMPLEX-B12) TABS Take one tablet daily 90 tablet 1  . cloNIDine (CATAPRES) 0.1 MG tablet Take 1 tablet (0.1 mg total) by mouth 2 (two) times daily. 60 tablet 0  . clopidogrel (PLAVIX) 75 MG tablet Place 1 tablet (75 mg total) into feeding tube daily.    . famotidine (PEPCID) 20 MG tablet Take 1 tablet (20 mg total) by mouth daily.     . ferrous sulfate 325 (65 FE) MG tablet Take 1 tablet (325 mg total) by mouth daily with breakfast. 90 tablet 1  . senna (SENOKOT) 8.6 MG TABS tablet Take 2 tablets (17.2 mg total) by mouth at bedtime as needed for mild constipation. 120 each 0  . carvedilol (COREG) 3.125 MG tablet Take 1 tablet (3.125 mg total) by mouth 2 (two) times daily with a meal. 60 tablet 0  . cloNIDine (CATAPRES - DOSED IN MG/24 HR) 0.1 mg/24hr patch Place 1 patch (0.1 mg total) onto the skin every Thursday. 4 patch 12   Facility-Administered Medications Prior to Visit  Medication Dose Route Frequency Provider Last Rate Last Dose  . etomidate (AMIDATE) injection    Anesthesia Intra-op Lucinda Dell, CRNA   12 mg at 06/09/18 1445  . succinylcholine (ANECTINE) injection    Anesthesia Intra-op Lucinda Dell, CRNA   100 mg at 06/09/18 1445    No Known Allergies  ROS Review of Systems  Constitutional: Negative.   Respiratory: Negative.   Cardiovascular: Negative.   Gastrointestinal: Negative.   Hematological: Does not bruise/bleed easily.  Psychiatric/Behavioral: Negative.       Objective:    Physical Exam  Constitutional: He is oriented to person, place, and time. He appears well-developed and well-nourished. No distress.  HENT:  Head: Normocephalic and atraumatic.  Right Ear: External ear normal.  Left Ear: External ear normal.  Eyes: Right eye exhibits no discharge. Left eye exhibits no discharge. No scleral icterus.  Pulmonary/Chest: Breath sounds normal.  Neurological: He is alert and oriented to person, place, and time.  Skin: Skin is warm and dry. He is not diaphoretic.  Psychiatric: He has a normal mood and affect. His behavior is normal.    BP 118/70   Pulse (!) 59   Ht 5\' 7"  (1.702 m)   SpO2 95%   BMI 25.69 kg/m  Wt Readings from Last 3 Encounters:  08/25/18 164 lb (74.4 kg)  08/21/18 164 lb (74.4 kg)  08/05/18 156 lb 9.6 oz (71 kg)   BP Readings from Last 3 Encounters:    09/01/18 118/70  08/25/18 100/70  08/21/18 (!) 100/55   Health Maintenance Due  Topic Date Due  . Hepatitis C Screening  23-May-1946  . TETANUS/TDAP  08/05/1963  . COLONOSCOPY  08/04/1994    There are no preventive care reminders to display for this patient.  Lab Results  Component Value Date   TSH 3.204 06/04/2018   Lab Results  Component Value Date   WBC 7.1 08/25/2018   HGB 11.8 (L) 08/25/2018   HCT 35.8 (L) 08/25/2018   MCV 83.6 08/25/2018   PLT 199.0 08/25/2018   Lab Results  Component Value Date   NA 136 06/28/2018   K 4.0 06/28/2018   CO2 22 06/28/2018   GLUCOSE 89 06/28/2018   BUN 18 06/28/2018   CREATININE 1.31 (H) 06/28/2018   BILITOT 0.6 06/20/2018   ALKPHOS 50 06/20/2018   AST 20 06/20/2018   ALT 27 06/20/2018   PROT 5.0 (L) 06/20/2018   ALBUMIN 2.2 (L) 06/20/2018   CALCIUM 8.1 (L) 06/28/2018   ANIONGAP 7 06/28/2018   Lab Results  Component Value Date   CHOL 155 06/07/2018   Lab Results  Component Value Date   HDL 31 (L) 06/07/2018   Lab Results  Component Value Date   LDLCALC 93 06/07/2018   Lab Results  Component Value Date   TRIG 154 (H) 06/07/2018   Lab Results  Component Value Date   CHOLHDL 5.0 06/07/2018   Lab Results  Component Value Date   HGBA1C 6.1 (H) 06/06/2018      Assessment & Plan:   Problem List Items Addressed This Visit      Cardiovascular and Mediastinum   Hypotension - Primary   Relevant Medications   carvedilol (COREG) 3.125 MG tablet     Other   Anemia   B12 deficiency   Iron deficiency   Other fatigue      Meds ordered this encounter  Medications  . carvedilol (COREG) 3.125 MG tablet    Sig: Take 1 tablet (3.125 mg total) by mouth 2 (two) times daily with a meal.    Dispense:  60 tablet    Refill:  0    Follow-up: Return in about 2 weeks (around 09/15/2018), or take coreg twice daily and clonidine 0.1mg  once daily.   Patient will take the Coreg twice daily and decrease the clonidine to 1  times daily.  Patient's wife will continue to check his blood pressure.  Acceptable blood pressure would be averaging less than 150 over less than 90.  We will plan on increasing Coreg in 2 weeks.  Follow-up will be in 2 weeks unless the blood pressure starts averaging over 150/90.

## 2018-09-06 DIAGNOSIS — I420 Dilated cardiomyopathy: Secondary | ICD-10-CM | POA: Diagnosis not present

## 2018-09-06 DIAGNOSIS — M4712 Other spondylosis with myelopathy, cervical region: Secondary | ICD-10-CM | POA: Diagnosis not present

## 2018-09-06 DIAGNOSIS — I69354 Hemiplegia and hemiparesis following cerebral infarction affecting left non-dominant side: Secondary | ICD-10-CM | POA: Diagnosis not present

## 2018-09-06 DIAGNOSIS — I2510 Atherosclerotic heart disease of native coronary artery without angina pectoris: Secondary | ICD-10-CM | POA: Diagnosis not present

## 2018-09-06 DIAGNOSIS — N183 Chronic kidney disease, stage 3 (moderate): Secondary | ICD-10-CM | POA: Diagnosis not present

## 2018-09-06 DIAGNOSIS — I130 Hypertensive heart and chronic kidney disease with heart failure and stage 1 through stage 4 chronic kidney disease, or unspecified chronic kidney disease: Secondary | ICD-10-CM | POA: Diagnosis not present

## 2018-09-06 DIAGNOSIS — S21211D Laceration without foreign body of right back wall of thorax without penetration into thoracic cavity, subsequent encounter: Secondary | ICD-10-CM | POA: Diagnosis not present

## 2018-09-06 DIAGNOSIS — E785 Hyperlipidemia, unspecified: Secondary | ICD-10-CM | POA: Diagnosis not present

## 2018-09-06 DIAGNOSIS — M4802 Spinal stenosis, cervical region: Secondary | ICD-10-CM | POA: Diagnosis not present

## 2018-09-06 DIAGNOSIS — I509 Heart failure, unspecified: Secondary | ICD-10-CM | POA: Diagnosis not present

## 2018-09-15 ENCOUNTER — Telehealth: Payer: Self-pay | Admitting: Family Medicine

## 2018-09-15 ENCOUNTER — Encounter: Payer: Self-pay | Admitting: Family Medicine

## 2018-09-15 ENCOUNTER — Ambulatory Visit (INDEPENDENT_AMBULATORY_CARE_PROVIDER_SITE_OTHER): Payer: BLUE CROSS/BLUE SHIELD | Admitting: Family Medicine

## 2018-09-15 VITALS — BP 124/70 | HR 90 | Ht 67.0 in | Wt 173.4 lb

## 2018-09-15 DIAGNOSIS — I2583 Coronary atherosclerosis due to lipid rich plaque: Secondary | ICD-10-CM

## 2018-09-15 DIAGNOSIS — I2510 Atherosclerotic heart disease of native coronary artery without angina pectoris: Secondary | ICD-10-CM

## 2018-09-15 DIAGNOSIS — N183 Chronic kidney disease, stage 3 unspecified: Secondary | ICD-10-CM

## 2018-09-15 DIAGNOSIS — I6521 Occlusion and stenosis of right carotid artery: Secondary | ICD-10-CM

## 2018-09-15 DIAGNOSIS — E538 Deficiency of other specified B group vitamins: Secondary | ICD-10-CM

## 2018-09-15 DIAGNOSIS — E611 Iron deficiency: Secondary | ICD-10-CM

## 2018-09-15 DIAGNOSIS — R5383 Other fatigue: Secondary | ICD-10-CM | POA: Diagnosis not present

## 2018-09-15 DIAGNOSIS — I420 Dilated cardiomyopathy: Secondary | ICD-10-CM | POA: Diagnosis not present

## 2018-09-15 DIAGNOSIS — K219 Gastro-esophageal reflux disease without esophagitis: Secondary | ICD-10-CM | POA: Diagnosis not present

## 2018-09-15 DIAGNOSIS — I10 Essential (primary) hypertension: Secondary | ICD-10-CM

## 2018-09-15 DIAGNOSIS — R29898 Other symptoms and signs involving the musculoskeletal system: Secondary | ICD-10-CM

## 2018-09-15 DIAGNOSIS — D649 Anemia, unspecified: Secondary | ICD-10-CM

## 2018-09-15 MED ORDER — B COMPLEX-B12 PO TABS: ORAL_TABLET | ORAL | 1 refills | Status: AC

## 2018-09-15 MED ORDER — CLOPIDOGREL BISULFATE 75 MG PO TABS: 75.0000 mg | ORAL_TABLET | Freq: Every day | ORAL | 0 refills | Status: DC

## 2018-09-15 MED ORDER — ATORVASTATIN CALCIUM 80 MG PO TABS: 80.0000 mg | ORAL_TABLET | Freq: Every day | ORAL | 1 refills | Status: DC

## 2018-09-15 MED ORDER — FAMOTIDINE 20 MG PO TABS: 20.0000 mg | ORAL_TABLET | Freq: Every day | ORAL | 1 refills | Status: DC

## 2018-09-15 MED ORDER — CARVEDILOL 3.125 MG PO TABS: 3.1250 mg | ORAL_TABLET | Freq: Two times a day (BID) | ORAL | 1 refills | Status: DC

## 2018-09-15 NOTE — Telephone Encounter (Signed)
Rx's resent.  

## 2018-09-15 NOTE — Progress Notes (Addendum)
Established Patient Office Visit  Subjective:  Patient ID: Ian Ramos, male    DOB: 07/18/1946  Age: 72 y.o. MRN: [id number]  CC:  Chief Complaint  Patient presents with  . Follow-up    HPI Ian Ramos presents for follow-up of his blood pressure with ongoing clonidine taper.  He has been taking the clonidine every other day and his blood pressures have remained quite stable in the 110 to 130 range over 70 to 80 range.  He needs refills on his Pepcid that has helped control his reflux.  He is currently tolerating high-dose statin therapy with Lipitor 80 mg daily.  Neurosurgery is recommending more cervical spine surgery to help correct his left arm weakness and right arm weakness and paresthesias in the near future.  Past Medical History:  Diagnosis Date  . Arthritis   . Neuromuscular disorder (HCC)   . Stroke [facility])     Past Surgical History:  Procedure Laterality Date  . CAROTID PTA/STENT INTERVENTION N/A 06/27/2018   Procedure: CAROTID PTA/STENT INTERVENTION;  Surgeon: Sherren Kerns, MD;  Location: [facility] INVASIVE CV LAB;  Service: Cardiovascular;  Laterality: N/A;  . LEFT HEART CATH AND CORONARY ANGIOGRAPHY N/A 06/05/2018   Procedure: LEFT HEART CATH AND CORONARY ANGIOGRAPHY;  Surgeon: Swazil[place], Peter M, MD;  Location: [facility] INVASIVE CV LAB;  Service: Cardiovascular;  Laterality: N/A;  . POSTERIOR CERVICAL FUSION/FORAMINOTOMY N/A 05/23/2018   Procedure: Posterior Cervical Fusion with lateral mass fixation - C1 - C6 with laminectomy;  Surgeon: Julio Sicks, MD;  Location: [facility] OR;  Service: Neurosurgery;  Laterality: N/A;    Family History  Problem Relation Age of Onset  . Hypertension Other   . Heart failure Mother   . CAD Father        s/p CABG in his 20s  . Heart failure Father     Social History   Socioeconomic History  . Marital status: Divorced    Spouse name: Not on file  . Number of children: Not on file  . Years of education: Not on file  . Highest  education level: Not on file  Occupational History  . Not on file  Social Needs  . Financial resource strain: Not on file  . Food insecurity:    Worry: Not on file    Inability: Not on file  . Transportation needs:    Medical: Not on file    Non-medical: Not on file  Tobacco Use  . Smoking status: Never Smoker  . Smokeless tobacco: Current User    Types: Chew  Substance and Sexual Activity  . Alcohol use: No    Frequency: Never  . Drug use: No  . Sexual activity: Not on file  Lifestyle  . Physical activity:    Days per week: Patient refused    Minutes per session: Patient refused  . Stress: Patient refused  Relationships  . Social connections:    Talks on phone: Not on file    Gets together: Not on file    Attends religious service: Not on file    Active member of club or organization: Not on file    Attends meetings of clubs or organizations: Not on file    Relationship status: Not on file  . Intimate partner violence:    Fear of current or ex partner: Not on file    Emotionally abused: Not on file    Physically abused: Not on file    Forced sexual activity: Not on file  Other Topics Concern  . Not on file  Social History Narrative   NA    Outpatient Medications Prior to Visit  Medication Sig Dispense Refill  . acetaminophen (TYLENOL) 325 MG tablet Take 2 tablets (650 mg total) by mouth every 4 (four) hours as needed for headache or mild pain.    Marland Kitchen aspirin 81 MG chewable tablet Place 2 tablets (162 mg total) into feeding tube daily.    . ferrous sulfate 325 (65 FE) MG tablet Take 1 tablet (325 mg total) by mouth daily with breakfast. 90 tablet 1  . senna (SENOKOT) 8.6 MG TABS tablet Take 2 tablets (17.2 mg total) by mouth at bedtime as needed for mild constipation. 120 each 0  . atorvastatin (LIPITOR) 80 MG tablet Place 1 tablet (80 mg total) into feeding tube daily at 6 PM.    . B Complex Vitamins (B COMPLEX-B12) TABS Take one tablet daily 90 tablet 1  .  carvedilol (COREG) 3.125 MG tablet Take 1 tablet (3.125 mg total) by mouth 2 (two) times daily with a meal. 60 tablet 0  . cloNIDine (CATAPRES) 0.1 MG tablet Take 1 tablet (0.1 mg total) by mouth 2 (two) times daily. 60 tablet 0  . clopidogrel (PLAVIX) 75 MG tablet Place 1 tablet (75 mg total) into feeding tube daily.    . famotidine (PEPCID) 20 MG tablet Take 1 tablet (20 mg total) by mouth daily.     Facility-Administered Medications Prior to Visit  Medication Dose Route Frequency Provider Last Rate Last Dose  . etomidate (AMIDATE) injection    Anesthesia Intra-op Lucinda Dell, CRNA   12 mg at 06/09/18 1445  . succinylcholine (ANECTINE) injection    Anesthesia Intra-op Lucinda Dell, CRNA   100 mg at 06/09/18 1445    No Known Allergies  ROS Review of Systems  Constitutional: Positive for fatigue. Negative for chills, diaphoresis, fever and unexpected weight change.  HENT: Negative.   Respiratory: Negative.   Cardiovascular: Negative.   Gastrointestinal: Negative.   Allergic/Immunologic: Negative for immunocompromised state.  Neurological: Positive for weakness and numbness. Negative for headaches.  Psychiatric/Behavioral: Negative.       Objective:    Physical Exam  Constitutional: He is oriented to person, place, and time. He appears well-developed and well-nourished. No distress.  HENT:  Head: Normocephalic and atraumatic.  Right Ear: External ear normal.  Left Ear: External ear normal.  Mouth/Throat: Oropharynx is clear and moist. No oropharyngeal exudate.  Eyes: Pupils are equal, round, and reactive to light. Conjunctivae are normal. Right eye exhibits no discharge. Left eye exhibits no discharge. No scleral icterus.  Neck: Neck supple. No JVD present. No tracheal deviation present. No thyromegaly present.  Pulmonary/Chest: Effort normal.  Lymphadenopathy:    He has no cervical adenopathy.  Neurological: He is alert and oriented to person, place, and time.    Skin: Skin is warm and dry. He is not diaphoretic.  Psychiatric: He has a normal mood and affect. His behavior is normal.    BP 124/70   Pulse 90   Ht 5\' 7"  (1.702 m)   Wt 173 lb 6 oz (78.6 kg)   SpO2 100%   BMI 27.15 kg/m  Wt Readings from Last 3 Encounters:  09/15/18 173 lb 6 oz (78.6 kg)  08/25/18 164 lb (74.4 kg)  08/21/18 164 lb (74.4 kg)   BP Readings from Last 3 Encounters:  09/15/18 124/70  09/01/18 118/70  08/25/18 100/70   Health Maintenance Due  Topic  Date Due  . Hepatitis C Screening  07[birth year]30[birth year]  . TETANUS/TDAP  08/05/1963  . COLONOSCOPY  08/04/1994    There are no preventive care reminders to display for this patient.  Lab Results  Component Value Date   TSH 3.204 06/04/2018   Lab Results  Component Value Date   WBC 7.1 08/25/2018   HGB 11.8 (L) 08/25/2018   HCT 35.8 (L) 08/25/2018   MCV 83.6 08/25/2018   PLT 199.0 08/25/2018   Lab Results  Component Value Date   NA 136 06/28/2018   K 4.0 06/28/2018   CO2 22 06/28/2018   GLUCOSE 89 06/28/2018   BUN 18 06/28/2018   CREATININE 1.31 (H) 06/28/2018   BILITOT 0.6 06/20/2018   ALKPHOS 50 06/20/2018   AST 20 06/20/2018   ALT 27 06/20/2018   PROT 5.0 (L) 06/20/2018   ALBUMIN 2.2 (L) 06/20/2018   CALCIUM 8.1 (L) 06/28/2018   ANIONGAP 7 06/28/2018   Lab Results  Component Value Date   CHOL 155 06/07/2018   Lab Results  Component Value Date   HDL 31 (L) 06/07/2018   Lab Results  Component Value Date   LDLCALC 93 06/07/2018   Lab Results  Component Value Date   TRIG 154 (H) 06/07/2018   Lab Results  Component Value Date   CHOLHDL 5.0 06/07/2018   Lab Results  Component Value Date   HGBA1C 6.1 (H) 06/06/2018      Assessment & Plan:   Problem List Items Addressed This Visit      Cardiovascular and Mediastinum   DCM (dilated cardiomyopathy) (HCC)   Relevant Medications   atorvastatin (LIPITOR) 80 MG tablet   carvedilol (COREG) 3.125 MG tablet   clopidogrel (PLAVIX) 75 MG  tablet   Coronary artery disease due to lipid rich plaque - Primary   Relevant Medications   atorvastatin (LIPITOR) 80 MG tablet   carvedilol (COREG) 3.125 MG tablet   clopidogrel (PLAVIX) 75 MG tablet   Essential hypertension   Relevant Medications   atorvastatin (LIPITOR) 80 MG tablet   carvedilol (COREG) 3.125 MG tablet   Right-sided extracranial carotid artery stenosis   Relevant Medications   atorvastatin (LIPITOR) 80 MG tablet   carvedilol (COREG) 3.125 MG tablet   clopidogrel (PLAVIX) 75 MG tablet     Genitourinary   CKD (chronic kidney disease) stage 3, GFR 30-59 ml/min (HCC)     Other   Left arm weakness   Anemia   Relevant Medications   B Complex Vitamins (B COMPLEX-B12) TABS   B12 deficiency   Iron deficiency   Other fatigue    Other Visit Diagnoses    Gastroesophageal reflux disease, esophagitis presence not specified       Relevant Medications   famotidine (PEPCID) 20 MG tablet      Meds ordered this encounter  Medications  . atorvastatin (LIPITOR) 80 MG tablet    Sig: Place 1 tablet (80 mg total) into feeding tube daily at 6 PM.    Dispense:  90 tablet    Refill:  1  . B Complex Vitamins (B COMPLEX-B12) TABS    Sig: Take one tablet daily    Dispense:  90 tablet    Refill:  1  . carvedilol (COREG) 3.125 MG tablet    Sig: Take 1 tablet (3.125 mg total) by mouth 2 (two) times daily with a meal.    Dispense:  180 tablet    Refill:  1  . clopidogrel (PLAVIX) 75 MG tablet  Sig: Place 1 tablet (75 mg total) into feeding tube daily.    Dispense:  90 tablet    Refill:  0  . famotidine (PEPCID) 20 MG tablet    Sig: Take 1 tablet (20 mg total) by mouth daily.    Dispense:  90 tablet    Refill:  1    Follow-up: Return in about 3 weeks (around 10/06/2018).    Blood pressure remained stable with the clonidine taper.  We will take clonidine every other day for the next 2 weeks and then then stop.  follow-up with me in 3 weeks.  Continue monitoring blood  pressure and follow-up with pressures consistently running in the greater than 150/90 range.  Continue Coreg as directed.

## 2018-09-15 NOTE — Telephone Encounter (Signed)
Copied from CRM [id number]. Topic: Quick Communication - See Telephone Encounter >> Sep 15, 2018  4:35 PM Arlyss Gandy, NT wrote: CRM for notification. See Telephone encounter for: 09/15/18. Pharmacy did not receive the rx for clopidogrel (PLAVIX) 75 MG tablet and lipitor. Pts fiancee asking for these 2 to be resent.

## 2018-10-06 ENCOUNTER — Ambulatory Visit (INDEPENDENT_AMBULATORY_CARE_PROVIDER_SITE_OTHER): Payer: BLUE CROSS/BLUE SHIELD | Admitting: Family Medicine

## 2018-10-06 ENCOUNTER — Other Ambulatory Visit: Payer: Self-pay | Admitting: Family Medicine

## 2018-10-06 ENCOUNTER — Encounter: Payer: Self-pay | Admitting: Family Medicine

## 2018-10-06 VITALS — BP 120/70 | HR 70 | Temp 98.3°F | Ht 67.0 in | Wt 172.4 lb

## 2018-10-06 DIAGNOSIS — D649 Anemia, unspecified: Secondary | ICD-10-CM | POA: Diagnosis not present

## 2018-10-06 DIAGNOSIS — I10 Essential (primary) hypertension: Secondary | ICD-10-CM | POA: Diagnosis not present

## 2018-10-06 DIAGNOSIS — L989 Disorder of the skin and subcutaneous tissue, unspecified: Secondary | ICD-10-CM | POA: Insufficient documentation

## 2018-10-06 DIAGNOSIS — I2583 Coronary atherosclerosis due to lipid rich plaque: Principal | ICD-10-CM

## 2018-10-06 DIAGNOSIS — J019 Acute sinusitis, unspecified: Secondary | ICD-10-CM | POA: Insufficient documentation

## 2018-10-06 DIAGNOSIS — E538 Deficiency of other specified B group vitamins: Secondary | ICD-10-CM | POA: Diagnosis not present

## 2018-10-06 DIAGNOSIS — E611 Iron deficiency: Secondary | ICD-10-CM | POA: Diagnosis not present

## 2018-10-06 DIAGNOSIS — M4712 Other spondylosis with myelopathy, cervical region: Secondary | ICD-10-CM

## 2018-10-06 DIAGNOSIS — I2510 Atherosclerotic heart disease of native coronary artery without angina pectoris: Secondary | ICD-10-CM

## 2018-10-06 LAB — CBC
HCT: 37.2 % — ABNORMAL LOW (ref 39.0–52.0)
HEMOGLOBIN: 12.3 g/dL — AB (ref 13.0–17.0)
MCHC: 33 g/dL (ref 30.0–36.0)
MCV: 83.6 fl (ref 78.0–100.0)
PLATELETS: 201 10*3/uL (ref 150.0–400.0)
RBC: 4.45 Mil/uL (ref 4.22–5.81)
RDW: 16.2 % — ABNORMAL HIGH (ref 11.5–15.5)
WBC: 8.4 10*3/uL (ref 4.0–10.5)

## 2018-10-06 LAB — VITAMIN B12: Vitamin B-12: 510 pg/mL (ref 211–911)

## 2018-10-06 MED ORDER — AZITHROMYCIN 250 MG PO TABS: ORAL_TABLET | ORAL | 0 refills | Status: DC

## 2018-10-06 NOTE — Progress Notes (Addendum)
Established Patient Office Visit  Subjective:  Patient ID: Ian Ramos, male    DOB: 1945/12/30  Age: 73 y.o. MRN: [id number]  CC:  Chief Complaint  Patient presents with  . Follow-up    HPI Ian Ramos presents for follow-up of blood pressure status post clonidine taper.  Off clonidine for 2 weeks now and blood pressures consistently run in the less than 130 over less than 90 range on Coreg alone.  Patient is tolerating the drug well.  He has been taking his iron and B12 complex.  We will recheck CBC B12 and iron levels today.  He has had a URI over the last several days that is include included purulent rhinorrhea.  No facial pressure or teeth pain.  No fevers chills nausea or vomiting.  He has had a lesion on his back that was actually present before he went in for his neck surgery almost 3 months ago.  Lesion is been nonhealing and did not respond well to all of the time he spent in the supine position.  It certainly has not healed since he left rehab and has been at home.  Physical therapy would like for him to try to use a cane.  Patient is weak in his left upper extremity.  He is having significant paresthesias in both upper extremities.  At this point in time I am concerned about him using anything but his walker.  Neurosurgeon is wanting to treat these issues involving his upper extremities.  Past Medical History:  Diagnosis Date  . Arthritis   . Neuromuscular disorder (HCC)   . Stroke [facility])     Past Surgical History:  Procedure Laterality Date  . CAROTID PTA/STENT INTERVENTION N/A 06/27/2018   Procedure: CAROTID PTA/STENT INTERVENTION;  Surgeon: Sherren Kerns, MD;  Location: [facility] INVASIVE CV LAB;  Service: Cardiovascular;  Laterality: N/A;  . LEFT HEART CATH AND CORONARY ANGIOGRAPHY N/A 06/05/2018   Procedure: LEFT HEART CATH AND CORONARY ANGIOGRAPHY;  Surgeon: Swazil[place], Peter M, MD;  Location: [facility] INVASIVE CV LAB;  Service: Cardiovascular;  Laterality: N/A;  .  POSTERIOR CERVICAL FUSION/FORAMINOTOMY N/A 05/23/2018   Procedure: Posterior Cervical Fusion with lateral mass fixation - C1 - C6 with laminectomy;  Surgeon: Julio Sicks, MD;  Location: [facility] OR;  Service: Neurosurgery;  Laterality: N/A;    Family History  Problem Relation Age of Onset  . Hypertension Other   . Heart failure Mother   . CAD Father        s/p CABG in his 60s  . Heart failure Father     Social History   Socioeconomic History  . Marital status: Divorced    Spouse name: Not on file  . Number of children: Not on file  . Years of education: Not on file  . Highest education level: Not on file  Occupational History  . Not on file  Social Needs  . Financial resource strain: Not on file  . Food insecurity:    Worry: Not on file    Inability: Not on file  . Transportation needs:    Medical: Not on file    Non-medical: Not on file  Tobacco Use  . Smoking status: Never Smoker  . Smokeless tobacco: Current User    Types: Chew  Substance and Sexual Activity  . Alcohol use: No    Frequency: Never  . Drug use: No  . Sexual activity: Not on file  Lifestyle  . Physical activity:    Days per  week: Patient refused    Minutes per session: Patient refused  . Stress: Patient refused  Relationships  . Social connections:    Talks on phone: Not on file    Gets together: Not on file    Attends religious service: Not on file    Active member of club or organization: Not on file    Attends meetings of clubs or organizations: Not on file    Relationship status: Not on file  . Intimate partner violence:    Fear of current or ex partner: Not on file    Emotionally abused: Not on file    Physically abused: Not on file    Forced sexual activity: Not on file  Other Topics Concern  . Not on file  Social History Narrative   NA    Outpatient Medications Prior to Visit  Medication Sig Dispense Refill  . acetaminophen (TYLENOL) 325 MG tablet Take 2 tablets (650 mg total) by  mouth every 4 (four) hours as needed for headache or mild pain.    Marland Kitchen aspirin 81 MG chewable tablet Place 2 tablets (162 mg total) into feeding tube daily.    Marland Kitchen atorvastatin (LIPITOR) 80 MG tablet Place 1 tablet (80 mg total) into feeding tube daily at 6 PM. 90 tablet 1  . B Complex Vitamins (B COMPLEX-B12) TABS Take one tablet daily 90 tablet 1  . carvedilol (COREG) 3.125 MG tablet TAKE 1 TABLET(3.125 MG) BY MOUTH TWICE DAILY WITH A MEAL 60 tablet 1  . clopidogrel (PLAVIX) 75 MG tablet Place 1 tablet (75 mg total) into feeding tube daily. 90 tablet 0  . famotidine (PEPCID) 20 MG tablet Take 1 tablet (20 mg total) by mouth daily. 90 tablet 1  . ferrous sulfate 325 (65 FE) MG tablet Take 1 tablet (325 mg total) by mouth daily with breakfast. 90 tablet 1  . senna (SENOKOT) 8.6 MG TABS tablet Take 2 tablets (17.2 mg total) by mouth at bedtime as needed for mild constipation. 120 each 0   Facility-Administered Medications Prior to Visit  Medication Dose Route Frequency Provider Last Rate Last Dose  . etomidate (AMIDATE) injection    Anesthesia Intra-op Lucinda Dell, CRNA   12 mg at 06/09/18 1445  . succinylcholine (ANECTINE) injection    Anesthesia Intra-op Lucinda Dell, CRNA   100 mg at 06/09/18 1445    No Known Allergies  ROS Review of Systems  Constitutional: Negative for chills, diaphoresis, fatigue, fever and unexpected weight change.  HENT: Positive for congestion, postnasal drip and rhinorrhea. Negative for ear pain, facial swelling and sinus pain.   Eyes: Negative for photophobia and visual disturbance.  Respiratory: Negative.   Cardiovascular: Negative.   Gastrointestinal: Negative.   Endocrine: Negative.   Genitourinary: Negative.   Musculoskeletal: Positive for neck pain and neck stiffness.  Skin: Positive for color change and wound.  Neurological: Negative for light-headedness and headaches.  Hematological: Negative.   Psychiatric/Behavioral: Negative.         Objective:    Physical Exam  Constitutional: He is oriented to person, place, and time. He appears well-developed and well-nourished. No distress.  HENT:  Head: Normocephalic and atraumatic.  Right Ear: External ear normal.  Left Ear: External ear normal.  Mouth/Throat: No oropharyngeal exudate.  Eyes: Right eye exhibits no discharge. Left eye exhibits no discharge. No scleral icterus.  Neck: No JVD present. No tracheal deviation present.  Cardiovascular: Normal rate, regular rhythm and normal heart sounds.  Pulmonary/Chest: Effort normal and  breath sounds normal.  Abdominal: Bowel sounds are normal.  Neurological: He is alert and oriented to person, place, and time.  Skin: Skin is warm and dry. He is not diaphoretic.     Psychiatric: He has a normal mood and affect. His behavior is normal.    BP 120/70   Pulse 70   Temp 98.3 F (36.8 C) (Oral)   Ht 5\' 7"  (1.702 m)   Wt 172 lb 6 oz (78.2 kg)   SpO2 97%   BMI 27.00 kg/m  Wt Readings from Last 3 Encounters:  10/06/18 172 lb 6 oz (78.2 kg)  09/15/18 173 lb 6 oz (78.6 kg)  08/25/18 164 lb (74.4 kg)   BP Readings from Last 3 Encounters:  10/06/18 120/70  09/15/18 124/70  09/01/18 118/70   Guideline developer:  UpToDate (see UpToDate for funding source) Date Released: June 2014  Health Maintenance Due  Topic Date Due  . Hepatitis C Screening  05/23/1946  . TETANUS/TDAP  08/05/1963  . COLONOSCOPY  08/04/1994  . PNA vac Low Risk Adult (2 of 2 - PCV13) 09/16/2018    There are no preventive care reminders to display for this patient.  Lab Results  Component Value Date   TSH 3.204 06/04/2018   Lab Results  Component Value Date   WBC 8.4 10/06/2018   HGB 12.3 (L) 10/06/2018   HCT 37.2 (L) 10/06/2018   MCV 83.6 10/06/2018   PLT 201.0 10/06/2018   Lab Results  Component Value Date   NA 136 06/28/2018   K 4.0 06/28/2018   CO2 22 06/28/2018   GLUCOSE 89 06/28/2018   BUN 18 06/28/2018   CREATININE 1.31 (H)  06/28/2018   BILITOT 0.6 06/20/2018   ALKPHOS 50 06/20/2018   AST 20 06/20/2018   ALT 27 06/20/2018   PROT 5.0 (L) 06/20/2018   ALBUMIN 2.2 (L) 06/20/2018   CALCIUM 8.1 (L) 06/28/2018   ANIONGAP 7 06/28/2018   Lab Results  Component Value Date   CHOL 155 06/07/2018   Lab Results  Component Value Date   HDL 31 (L) 06/07/2018   Lab Results  Component Value Date   LDLCALC 93 06/07/2018   Lab Results  Component Value Date   TRIG 154 (H) 06/07/2018   Lab Results  Component Value Date   CHOLHDL 5.0 06/07/2018   Lab Results  Component Value Date   HGBA1C 6.1 (H) 06/06/2018      Assessment & Plan:   Problem List Items Addressed This Visit      Cardiovascular and Mediastinum   Essential hypertension - Primary     Respiratory   Acute non-recurrent sinusitis   Relevant Medications   azithromycin (ZITHROMAX) 250 MG tablet   sulfamethoxazole-trimethoprim (BACTRIM DS,SEPTRA DS) 800-160 MG tablet     Nervous and Auditory   Spondylosis, cervical, with myelopathy     Musculoskeletal and Integument   Skin lesion of back   Relevant Medications   sulfamethoxazole-trimethoprim (BACTRIM DS,SEPTRA DS) 800-160 MG tablet   Other Relevant Orders   Ambulatory referral to General Surgery   Wound culture (Completed)     Other   Anemia   Relevant Orders   CBC (Completed)   B12 deficiency   Relevant Orders   Vitamin B12 (Completed)   Iron deficiency   Relevant Orders   Iron, TIBC and Ferritin Panel (Completed)      Meds ordered this encounter  Medications  . azithromycin (ZITHROMAX) 250 MG tablet    Sig: Take 2 today  and then 1 each day until finished.    Dispense:  6 tablet    Refill:  0  . sulfamethoxazole-trimethoprim (BACTRIM DS,SEPTRA DS) 800-160 MG tablet    Sig: Take 1 tablet by mouth 2 (two) times daily for 10 days.    Dispense:  20 tablet    Refill:  0    Follow-up: Return in about 1 month (around 11/06/2018).

## 2018-10-07 LAB — IRON,TIBC AND FERRITIN PANEL
%SAT: 16 % (calc) — ABNORMAL LOW (ref 20–48)
Ferritin: 84 ng/mL (ref 24–380)
Iron: 45 ug/dL — ABNORMAL LOW (ref 50–180)
TIBC: 285 mcg/dL (calc) (ref 250–425)

## 2018-10-08 ENCOUNTER — Telehealth: Payer: Self-pay | Admitting: Family Medicine

## 2018-10-08 NOTE — Telephone Encounter (Signed)
TC from patient. Reviewed lab results and physician's note with patient and Kendal Hymen, including to continue iron supplements.

## 2018-10-09 LAB — WOUND CULTURE
MICRO NUMBER:: 15497
SPECIMEN QUALITY:: ADEQUATE

## 2018-10-09 MED ORDER — SULFAMETHOXAZOLE-TRIMETHOPRIM 800-160 MG PO TABS: 1.0000 | ORAL_TABLET | Freq: Two times a day (BID) | ORAL | 0 refills | Status: AC

## 2018-10-09 NOTE — Addendum Note (Signed)
Addended by: Andrez Grime on: 10/09/2018 01:42 PM   Modules accepted: Orders

## 2018-10-30 ENCOUNTER — Telehealth: Payer: Self-pay

## 2018-10-30 NOTE — Telephone Encounter (Signed)
I left Ian Ramos a voicemail to give me a call back.

## 2018-10-30 NOTE — Telephone Encounter (Signed)
Copied from CRM [id number]. Topic: Referral - Question >> Oct 30, 2018  9:37 AM Elliot Gault wrote: Jethro Bolus name: Melida Quitter  Relation to pt: (girlfriend)  Call back number:  [phone] or [phone]  Reason for call:  Patient in need of clairity regarding why he was referred to [place] [facility], patient was under the impression the medicationh prescribed was treating the Skin lesion on back, please advise

## 2018-10-30 NOTE — Telephone Encounter (Signed)
Lesion on back could be a cancer and needs to be excised.

## 2018-10-31 NOTE — Telephone Encounter (Signed)
I spoke with patient's girlfriend and we went over the information below. She verbalized understanding.

## 2018-11-06 ENCOUNTER — Encounter: Payer: Self-pay | Admitting: Family Medicine

## 2018-11-06 ENCOUNTER — Ambulatory Visit (INDEPENDENT_AMBULATORY_CARE_PROVIDER_SITE_OTHER): Payer: BLUE CROSS/BLUE SHIELD | Admitting: Family Medicine

## 2018-11-06 VITALS — BP 120/70 | HR 67 | Ht 67.0 in | Wt 173.5 lb

## 2018-11-06 DIAGNOSIS — G959 Disease of spinal cord, unspecified: Secondary | ICD-10-CM | POA: Diagnosis not present

## 2018-11-06 DIAGNOSIS — I10 Essential (primary) hypertension: Secondary | ICD-10-CM

## 2018-11-06 DIAGNOSIS — I6521 Occlusion and stenosis of right carotid artery: Secondary | ICD-10-CM

## 2018-11-06 DIAGNOSIS — L989 Disorder of the skin and subcutaneous tissue, unspecified: Secondary | ICD-10-CM

## 2018-11-06 DIAGNOSIS — D649 Anemia, unspecified: Secondary | ICD-10-CM

## 2018-11-06 DIAGNOSIS — E611 Iron deficiency: Secondary | ICD-10-CM

## 2018-11-06 DIAGNOSIS — M4802 Spinal stenosis, cervical region: Secondary | ICD-10-CM | POA: Diagnosis not present

## 2018-11-06 NOTE — Progress Notes (Signed)
Established Patient Office Visit  Subjective:  Patient ID: Ian Ramos, male    DOB: Mar 14, 1946  Age: 73 y.o. MRN: [id number]  CC:  Chief Complaint  Patient presents with  . Follow-up    HPI Ian Ramos presents for follow-up of his hypertension.  He is doing well with the carvedilol.  Patient continues atorvastatin, B12 complex and iron therapy without issue.  Significant other had been discontinued Plavix by mistake.  Discussed the importance of continuing with that medicine till neurosurgery that he discontinue it.  He is scheduled to see general surgery for the nonhealing lesion on his right upper back.  Completed antibiotic therapy and it does look better.  Patient is to see a neuro surgeon today to discuss decompressive surgery for his upper extremity neuropathy.  Past Medical History:  Diagnosis Date  . Arthritis   . Neuromuscular disorder (HCC)   . Stroke [facility])     Past Surgical History:  Procedure Laterality Date  . CAROTID PTA/STENT INTERVENTION N/A 06/27/2018   Procedure: CAROTID PTA/STENT INTERVENTION;  Surgeon: Sherren Kerns, MD;  Location: [facility] INVASIVE CV LAB;  Service: Cardiovascular;  Laterality: N/A;  . LEFT HEART CATH AND CORONARY ANGIOGRAPHY N/A 06/05/2018   Procedure: LEFT HEART CATH AND CORONARY ANGIOGRAPHY;  Surgeon: Swaziland, Peter M, MD;  Location: [facility] INVASIVE CV LAB;  Service: Cardiovascular;  Laterality: N/A;  . POSTERIOR CERVICAL FUSION/FORAMINOTOMY N/A 05/23/2018   Procedure: Posterior Cervical Fusion with lateral mass fixation - C1 - C6 with laminectomy;  Surgeon: Julio Sicks, MD;  Location: [facility] OR;  Service: Neurosurgery;  Laterality: N/A;    Family History  Problem Relation Age of Onset  . Hypertension Other   . Heart failure Mother   . CAD Father        s/p CABG in his 94s  . Heart failure Father     Social History   Socioeconomic History  . Marital status: Divorced    Spouse name: Not on file  . Number of children: Not on file    . Years of education: Not on file  . Highest education level: Not on file  Occupational History  . Not on file  Social Needs  . Financial resource strain: Not on file  . Food insecurity:    Worry: Not on file    Inability: Not on file  . Transportation needs:    Medical: Not on file    Non-medical: Not on file  Tobacco Use  . Smoking status: Never Smoker  . Smokeless tobacco: Current User    Types: Chew  Substance and Sexual Activity  . Alcohol use: No    Frequency: Never  . Drug use: No  . Sexual activity: Not on file  Lifestyle  . Physical activity:    Days per week: Patient refused    Minutes per session: Patient refused  . Stress: Patient refused  Relationships  . Social connections:    Talks on phone: Not on file    Gets together: Not on file    Attends religious service: Not on file    Active member of club or organization: Not on file    Attends meetings of clubs or organizations: Not on file    Relationship status: Not on file  . Intimate partner violence:    Fear of current or ex partner: Not on file    Emotionally abused: Not on file    Physically abused: Not on file    Forced sexual activity: Not  on file  Other Topics Concern  . Not on file  Social History Narrative   NA    Outpatient Medications Prior to Visit  Medication Sig Dispense Refill  . acetaminophen (TYLENOL) 325 MG tablet Take 2 tablets (650 mg total) by mouth every 4 (four) hours as needed for headache or mild pain.    Marland Kitchen. aspirin 81 MG chewable tablet Place 2 tablets (162 mg total) into feeding tube daily.    Marland Kitchen. atorvastatin (LIPITOR) 80 MG tablet Place 1 tablet (80 mg total) into feeding tube daily at 6 PM. 90 tablet 1  . B Complex Vitamins (B COMPLEX-B12) TABS Take one tablet daily 90 tablet 1  . carvedilol (COREG) 3.125 MG tablet TAKE 1 TABLET(3.125 MG) BY MOUTH TWICE DAILY WITH A MEAL 60 tablet 1  . clopidogrel (PLAVIX) 75 MG tablet Place 1 tablet (75 mg total) into feeding tube daily. 90  tablet 0  . famotidine (PEPCID) 20 MG tablet Take 1 tablet (20 mg total) by mouth daily. 90 tablet 1  . ferrous sulfate 325 (65 FE) MG tablet Take 1 tablet (325 mg total) by mouth daily with breakfast. 90 tablet 1  . senna (SENOKOT) 8.6 MG TABS tablet Take 2 tablets (17.2 mg total) by mouth at bedtime as needed for mild constipation. 120 each 0  . azithromycin (ZITHROMAX) 250 MG tablet Take 2 today and then 1 each day until finished. 6 tablet 0   Facility-Administered Medications Prior to Visit  Medication Dose Route Frequency Provider Last Rate Last Dose  . etomidate (AMIDATE) injection    Anesthesia Intra-op Lucinda Dellecarlo, Valerie M, CRNA   12 mg at 06/09/18 1445  . succinylcholine (ANECTINE) injection    Anesthesia Intra-op Lucinda Dellecarlo, Valerie M, CRNA   100 mg at 06/09/18 1445    No Known Allergies  ROS Review of Systems  Constitutional: Positive for fatigue. Negative for chills, diaphoresis, fever and unexpected weight change.  HENT: Negative.   Eyes: Negative for photophobia and visual disturbance.  Respiratory: Negative.   Cardiovascular: Negative.   Gastrointestinal: Negative.   Genitourinary: Negative.   Musculoskeletal: Positive for neck stiffness.  Skin: Positive for color change and wound.  Allergic/Immunologic: Negative for immunocompromised state.  Neurological: Positive for weakness and numbness.  Hematological: Does not bruise/bleed easily.  Psychiatric/Behavioral: Negative.       Objective:    Physical Exam  Constitutional: He is oriented to person, place, and time. He appears well-developed and well-nourished. No distress.  HENT:  Head: Normocephalic and atraumatic.  Right Ear: External ear normal.  Left Ear: External ear normal.  Eyes: Conjunctivae are normal. Right eye exhibits no discharge. Left eye exhibits no discharge. No scleral icterus.  Neck: No JVD present. No tracheal deviation present.  Cardiovascular: Normal rate, regular rhythm and normal heart sounds.    Pulmonary/Chest: Effort normal and breath sounds normal. No stridor.  Neurological: He is alert and oriented to person, place, and time.  Skin: Skin is warm and dry. He is not diaphoretic.     Psychiatric: He has a normal mood and affect. His behavior is normal.    BP 120/70   Pulse 67   Ht 5\' 7"  (1.702 m)   Wt 173 lb 8 oz (78.7 kg)   SpO2 99%   BMI 27.17 kg/m  Wt Readings from Last 3 Encounters:  11/06/18 173 lb 8 oz (78.7 kg)  10/06/18 172 lb 6 oz (78.2 kg)  09/15/18 173 lb 6 oz (78.6 kg)   BP Readings from  Last 3 Encounters:  11/06/18 120/70  10/06/18 120/70  09/15/18 124/70   Guideline developer:  UpToDate (see UpToDate for funding source) Date Released: June 2014  Health Maintenance Due  Topic Date Due  . Hepatitis C Screening  January 10, 1946  . TETANUS/TDAP  08/05/1963  . COLONOSCOPY  08/04/1994  . PNA vac Low Risk Adult (2 of 2 - PCV13) 09/16/2018    There are no preventive care reminders to display for this patient.  Lab Results  Component Value Date   TSH 3.204 06/04/2018   Lab Results  Component Value Date   WBC 8.4 10/06/2018   HGB 12.3 (L) 10/06/2018   HCT 37.2 (L) 10/06/2018   MCV 83.6 10/06/2018   PLT 201.0 10/06/2018   Lab Results  Component Value Date   NA 136 06/28/2018   K 4.0 06/28/2018   CO2 22 06/28/2018   GLUCOSE 89 06/28/2018   BUN 18 06/28/2018   CREATININE 1.31 (H) 06/28/2018   BILITOT 0.6 06/20/2018   ALKPHOS 50 06/20/2018   AST 20 06/20/2018   ALT 27 06/20/2018   PROT 5.0 (L) 06/20/2018   ALBUMIN 2.2 (L) 06/20/2018   CALCIUM 8.1 (L) 06/28/2018   ANIONGAP 7 06/28/2018   Lab Results  Component Value Date   CHOL 155 06/07/2018   Lab Results  Component Value Date   HDL 31 (L) 06/07/2018   Lab Results  Component Value Date   LDLCALC 93 06/07/2018   Lab Results  Component Value Date   TRIG 154 (H) 06/07/2018   Lab Results  Component Value Date   CHOLHDL 5.0 06/07/2018   Lab Results  Component Value Date    HGBA1C 6.1 (H) 06/06/2018      Assessment & Plan:   Problem List Items Addressed This Visit      Cardiovascular and Mediastinum   Essential hypertension - Primary   Right-sided extracranial carotid artery stenosis     Nervous and Auditory   Cervical myelopathy (HCC)     Musculoskeletal and Integument   Skin lesion of back     Other   Anemia   Iron deficiency      No orders of the defined types were placed in this encounter.   Follow-up: Return continue Plavix, aspirin, carvedilol, atorvastatin B12 complex and iron.  Follow-up with me a couple. weeks after surgery.

## 2018-11-17 DIAGNOSIS — I252 Old myocardial infarction: Secondary | ICD-10-CM | POA: Diagnosis not present

## 2018-11-17 DIAGNOSIS — Z95828 Presence of other vascular implants and grafts: Secondary | ICD-10-CM | POA: Diagnosis not present

## 2018-11-17 DIAGNOSIS — L89113 Pressure ulcer of right upper back, stage 3: Secondary | ICD-10-CM | POA: Diagnosis not present

## 2018-11-17 DIAGNOSIS — M542 Cervicalgia: Secondary | ICD-10-CM | POA: Diagnosis not present

## 2018-11-17 DIAGNOSIS — G8928 Other chronic postprocedural pain: Secondary | ICD-10-CM | POA: Diagnosis not present

## 2018-11-17 DIAGNOSIS — Z9889 Other specified postprocedural states: Secondary | ICD-10-CM | POA: Diagnosis not present

## 2018-11-17 DIAGNOSIS — Z8673 Personal history of transient ischemic attack (TIA), and cerebral infarction without residual deficits: Secondary | ICD-10-CM | POA: Diagnosis not present

## 2018-11-17 DIAGNOSIS — I10 Essential (primary) hypertension: Secondary | ICD-10-CM | POA: Diagnosis not present

## 2018-11-17 DIAGNOSIS — Z7901 Long term (current) use of anticoagulants: Secondary | ICD-10-CM | POA: Diagnosis not present

## 2018-12-01 ENCOUNTER — Other Ambulatory Visit: Payer: Self-pay | Admitting: Family Medicine

## 2018-12-01 DIAGNOSIS — I2510 Atherosclerotic heart disease of native coronary artery without angina pectoris: Secondary | ICD-10-CM

## 2018-12-01 DIAGNOSIS — I2583 Coronary atherosclerosis due to lipid rich plaque: Principal | ICD-10-CM

## 2018-12-01 DIAGNOSIS — I10 Essential (primary) hypertension: Secondary | ICD-10-CM

## 2018-12-14 ENCOUNTER — Other Ambulatory Visit: Payer: Self-pay | Admitting: Family Medicine

## 2018-12-14 DIAGNOSIS — I6521 Occlusion and stenosis of right carotid artery: Secondary | ICD-10-CM

## 2018-12-14 DIAGNOSIS — I2510 Atherosclerotic heart disease of native coronary artery without angina pectoris: Secondary | ICD-10-CM

## 2018-12-14 DIAGNOSIS — I2583 Coronary atherosclerosis due to lipid rich plaque: Principal | ICD-10-CM

## 2018-12-14 DIAGNOSIS — I420 Dilated cardiomyopathy: Secondary | ICD-10-CM

## 2018-12-18 ENCOUNTER — Telehealth: Payer: Self-pay | Admitting: Family Medicine

## 2018-12-18 DIAGNOSIS — K219 Gastro-esophageal reflux disease without esophagitis: Secondary | ICD-10-CM

## 2018-12-18 NOTE — Telephone Encounter (Signed)
Copied from CRM [id number]. Topic: General - Other >> Dec 18, 2018  4:37 PM Leafy Ro wrote: Reason for CRM: bonnie  is calling and famotidine 20 mg is on backorder . Pt needs another med. [facility] [address]

## 2018-12-19 ENCOUNTER — Other Ambulatory Visit: Payer: Self-pay

## 2018-12-19 DIAGNOSIS — K219 Gastro-esophageal reflux disease without esophagitis: Secondary | ICD-10-CM

## 2018-12-19 MED ORDER — CIMETIDINE 400 MG PO TABS: 400.0000 mg | ORAL_TABLET | Freq: Two times a day (BID) | ORAL | 5 refills | Status: DC

## 2018-12-19 MED ORDER — RANITIDINE HCL 300 MG PO TABS: 300.0000 mg | ORAL_TABLET | Freq: Every day | ORAL | 5 refills | Status: DC

## 2018-12-19 NOTE — Telephone Encounter (Signed)
I left Ian Ramos a voicemail letting her know that a new Rx has been sent in & to call us back with any questions.

## 2019-01-15 ENCOUNTER — Telehealth: Payer: Self-pay | Admitting: Family Medicine

## 2019-01-15 DIAGNOSIS — K219 Gastro-esophageal reflux disease without esophagitis: Secondary | ICD-10-CM

## 2019-01-15 MED ORDER — CIMETIDINE 400 MG PO TABS: 400.0000 mg | ORAL_TABLET | Freq: Every day | ORAL | 2 refills | Status: DC

## 2019-01-15 NOTE — Addendum Note (Signed)
Addended by: Andrez Grime on: 01/15/2019 01:49 PM   Modules accepted: Orders

## 2019-01-15 NOTE — Addendum Note (Signed)
Addended by: Andrez Grime on: 01/15/2019 01:48 PM   Modules accepted: Orders

## 2019-01-15 NOTE — Telephone Encounter (Signed)
Received fax from pharmacy that Ranitidine is on back order. Please advise what alternative patient can use.

## 2019-01-22 ENCOUNTER — Ambulatory Visit (INDEPENDENT_AMBULATORY_CARE_PROVIDER_SITE_OTHER): Payer: BLUE CROSS/BLUE SHIELD | Admitting: Family Medicine

## 2019-01-22 ENCOUNTER — Encounter: Payer: Self-pay | Admitting: Family Medicine

## 2019-01-22 VITALS — Ht 67.0 in

## 2019-01-22 DIAGNOSIS — E611 Iron deficiency: Secondary | ICD-10-CM

## 2019-01-22 DIAGNOSIS — M4712 Other spondylosis with myelopathy, cervical region: Secondary | ICD-10-CM | POA: Diagnosis not present

## 2019-01-22 DIAGNOSIS — R29898 Other symptoms and signs involving the musculoskeletal system: Secondary | ICD-10-CM | POA: Diagnosis not present

## 2019-01-22 DIAGNOSIS — N183 Chronic kidney disease, stage 3 unspecified: Secondary | ICD-10-CM

## 2019-01-22 DIAGNOSIS — I2583 Coronary atherosclerosis due to lipid rich plaque: Secondary | ICD-10-CM | POA: Diagnosis not present

## 2019-01-22 DIAGNOSIS — D649 Anemia, unspecified: Secondary | ICD-10-CM

## 2019-01-22 DIAGNOSIS — I2510 Atherosclerotic heart disease of native coronary artery without angina pectoris: Secondary | ICD-10-CM

## 2019-01-22 DIAGNOSIS — I10 Essential (primary) hypertension: Secondary | ICD-10-CM

## 2019-01-22 DIAGNOSIS — E538 Deficiency of other specified B group vitamins: Secondary | ICD-10-CM

## 2019-01-22 NOTE — Progress Notes (Signed)
Virtual Visit via Telephone Note  I connected with Jackquline Denmark Reddinger on 01/22/19 at  1:00 PM EDT by telephone and verified that I am speaking with the correct person using two identifiers.   I discussed the limitations, risks, security and privacy concerns of performing an evaluation and management service by telephone and the availability of in person appointments. I also discussed with the patient that there may be a patient responsible charge related to this service. The patient expressed understanding and agreed to proceed.  Interactive video and audio telecommunications were attempted between myself and the patient. However they failed due to the patient having technical difficulties or not having access to video capability. We continued and completed with audio only.   Established Patient Office Visit  Subjective:  Patient ID: Ian Ramos, male    DOB: 12-26-1945  Age: 73 y.o. MRN: [id number]  CC:  Chief Complaint  Patient presents with  . hand and fingertips    HPI Kush T Grenda presents for follow-up of his blood pressure anemia B12 and iron deficiency.  Blood pressures been running in the 120/70 range.  Continues to supplement with B complex and iron sulfate daily.  Unfortunately he feels as though the paresthesias in both of his arms are increasing.  His arms feel cold all of the time now.  At this time he does not believe that there is follow-up scheduled with Dr. Dutch Quint for reevaluation.  Past Medical History:  Diagnosis Date  . Arthritis   . Neuromuscular disorder (HCC)   . Stroke [facility])     Past Surgical History:  Procedure Laterality Date  . CAROTID PTA/STENT INTERVENTION N/A 06/27/2018   Procedure: CAROTID PTA/STENT INTERVENTION;  Surgeon: Sherren Kerns, MD;  Location: [facility] INVASIVE CV LAB;  Service: Cardiovascular;  Laterality: N/A;  . LEFT HEART CATH AND CORONARY ANGIOGRAPHY N/A 06/05/2018   Procedure: LEFT HEART CATH AND CORONARY ANGIOGRAPHY;  Surgeon:  Swaziland, Peter M, MD;  Location: [facility] INVASIVE CV LAB;  Service: Cardiovascular;  Laterality: N/A;  . POSTERIOR CERVICAL FUSION/FORAMINOTOMY N/A 05/23/2018   Procedure: Posterior Cervical Fusion with lateral mass fixation - C1 - C6 with laminectomy;  Surgeon: Julio Sicks, MD;  Location: [facility] OR;  Service: Neurosurgery;  Laterality: N/A;    Family History  Problem Relation Age of Onset  . Hypertension Other   . Heart failure Mother   . CAD Father        s/p CABG in his 45s  . Heart failure Father     Social History   Socioeconomic History  . Marital status: Divorced    Spouse name: Not on file  . Number of children: Not on file  . Years of education: Not on file  . Highest education level: Not on file  Occupational History  . Not on file  Social Needs  . Financial resource strain: Not on file  . Food insecurity:    Worry: Not on file    Inability: Not on file  . Transportation needs:    Medical: Not on file    Non-medical: Not on file  Tobacco Use  . Smoking status: Never Smoker  . Smokeless tobacco: Current User    Types: Chew  Substance and Sexual Activity  . Alcohol use: No    Frequency: Never  . Drug use: No  . Sexual activity: Not on file  Lifestyle  . Physical activity:    Days per week: Patient refused    Minutes per session: Patient refused  .  Stress: Patient refused  Relationships  . Social connections:    Talks on phone: Not on file    Gets together: Not on file    Attends religious service: Not on file    Active member of club or organization: Not on file    Attends meetings of clubs or organizations: Not on file    Relationship status: Not on file  . Intimate partner violence:    Fear of current or ex partner: Not on file    Emotionally abused: Not on file    Physically abused: Not on file    Forced sexual activity: Not on file  Other Topics Concern  . Not on file  Social History Narrative   NA    Outpatient Medications Prior to Visit  Medication  Sig Dispense Refill  . acetaminophen (TYLENOL) 325 MG tablet Take 2 tablets (650 mg total) by mouth every 4 (four) hours as needed for headache or mild pain.    Marland Kitchen aspirin 81 MG chewable tablet Place 2 tablets (162 mg total) into feeding tube daily.    Marland Kitchen atorvastatin (LIPITOR) 80 MG tablet Place 1 tablet (80 mg total) into feeding tube daily at 6 PM. 90 tablet 1  . B Complex Vitamins (B COMPLEX-B12) TABS Take one tablet daily 90 tablet 1  . carvedilol (COREG) 3.125 MG tablet TAKE 1 TABLET(3.125 MG) BY MOUTH TWICE DAILY WITH A MEAL 60 tablet 3  . cimetidine (TAGAMET) 400 MG tablet Take 1 tablet (400 mg total) by mouth at bedtime. 30 tablet 2  . clopidogrel (PLAVIX) 75 MG tablet PLACE 1 TABLET INTO FEEDING TUBE DAILY 90 tablet 0  . ferrous sulfate 325 (65 FE) MG tablet Take 1 tablet (325 mg total) by mouth daily with breakfast. 90 tablet 1  . senna (SENOKOT) 8.6 MG TABS tablet Take 2 tablets (17.2 mg total) by mouth at bedtime as needed for mild constipation. 120 each 0   Facility-Administered Medications Prior to Visit  Medication Dose Route Frequency Provider Last Rate Last Dose  . etomidate (AMIDATE) injection    Anesthesia Intra-op Lucinda Dell, CRNA   12 mg at 06/09/18 1445  . succinylcholine (ANECTINE) injection    Anesthesia Intra-op Lucinda Dell, CRNA   100 mg at 06/09/18 1445    No Known Allergies  ROS Review of Systems  Constitutional: Negative for chills, diaphoresis, fatigue, fever and unexpected weight change.  Respiratory: Negative.   Cardiovascular: Negative.   Gastrointestinal: Negative.   Musculoskeletal: Positive for neck pain and neck stiffness.  Neurological: Positive for weakness and numbness.      Objective:    Physical Exam  Constitutional: He is oriented to person, place, and time. No distress.  Pulmonary/Chest: Effort normal.  Neurological: He is alert and oriented to person, place, and time.  Psychiatric: He has a normal mood and affect. His  behavior is normal.    Ht 5\' 7"  (1.702 m)   BMI 27.17 kg/m  Wt Readings from Last 3 Encounters:  11/06/18 173 lb 8 oz (78.7 kg)  10/06/18 172 lb 6 oz (78.2 kg)  09/15/18 173 lb 6 oz (78.6 kg)     Health Maintenance Due  Topic Date Due  . Hepatitis C Screening  [birth year]  . TETANUS/TDAP  08/05/1963  . COLONOSCOPY  08/04/1994  . PNA vac Low Risk Adult (2 of 2 - PCV13) 09/16/2018    There are no preventive care reminders to display for this patient.  Lab Results  Component Value Date  TSH 3.204 06/04/2018   Lab Results  Component Value Date   WBC 8.4 10/06/2018   HGB 12.3 (L) 10/06/2018   HCT 37.2 (L) 10/06/2018   MCV 83.6 10/06/2018   PLT 201.0 10/06/2018   Lab Results  Component Value Date   NA 136 06/28/2018   K 4.0 06/28/2018   CO2 22 06/28/2018   GLUCOSE 89 06/28/2018   BUN 18 06/28/2018   CREATININE 1.31 (H) 06/28/2018   BILITOT 0.6 06/20/2018   ALKPHOS 50 06/20/2018   AST 20 06/20/2018   ALT 27 06/20/2018   PROT 5.0 (L) 06/20/2018   ALBUMIN 2.2 (L) 06/20/2018   CALCIUM 8.1 (L) 06/28/2018   ANIONGAP 7 06/28/2018   Lab Results  Component Value Date   CHOL 155 06/07/2018   Lab Results  Component Value Date   HDL 31 (L) 06/07/2018   Lab Results  Component Value Date   LDLCALC 93 06/07/2018   Lab Results  Component Value Date   TRIG 154 (H) 06/07/2018   Lab Results  Component Value Date   CHOLHDL 5.0 06/07/2018   Lab Results  Component Value Date   HGBA1C 6.1 (H) 06/06/2018      Assessment & Plan:   Problem List Items Addressed This Visit      Cardiovascular and Mediastinum   Coronary artery disease due to lipid rich plaque - Primary   Relevant Orders   LDL cholesterol, direct   Essential hypertension   Relevant Orders   Basic metabolic panel     Nervous and Auditory   Spondylosis, cervical, with myelopathy   Relevant Orders   Ambulatory referral to Neurosurgery     Genitourinary   CKD (chronic kidney disease) stage 3,  GFR 30-59 ml/min (HCC)   Relevant Orders   Basic metabolic panel     Other   Left arm weakness   Relevant Orders   Ambulatory referral to Neurosurgery   Anemia   Relevant Orders   CBC   B12 deficiency   Relevant Orders   Vitamin B12   Iron deficiency   Relevant Orders   Iron, TIBC and Ferritin Panel      No orders of the defined types were placed in this encounter.   Follow-up: Return in about 3 months (around 04/23/2019).    Mliss Sax, MD  History of Present Illness:    Observations/Objective:   Assessment and Plan:   Follow Up Instructions:    I discussed the assessment and treatment plan with the patient. The patient was provided an opportunity to ask questions and all were answered. The patient agreed with the plan and demonstrated an understanding of the instructions.   The patient was advised to call back or seek an in-person evaluation if the symptoms worsen or if the condition fails to improve as anticipated.  I provided 15 minutes of non-face-to-face time during this encounter.   Patient will make a lab appointment and I will adjust meds as needed.  Follow-up with me in 3 months hopefully for face-to-face.  We will try to reconnect the patient with his neurosurgeon.  For reevaluation.

## 2019-01-23 ENCOUNTER — Other Ambulatory Visit (INDEPENDENT_AMBULATORY_CARE_PROVIDER_SITE_OTHER): Payer: BLUE CROSS/BLUE SHIELD

## 2019-01-23 DIAGNOSIS — I2510 Atherosclerotic heart disease of native coronary artery without angina pectoris: Secondary | ICD-10-CM | POA: Diagnosis not present

## 2019-01-23 DIAGNOSIS — E611 Iron deficiency: Secondary | ICD-10-CM

## 2019-01-23 DIAGNOSIS — I10 Essential (primary) hypertension: Secondary | ICD-10-CM

## 2019-01-23 DIAGNOSIS — I2583 Coronary atherosclerosis due to lipid rich plaque: Secondary | ICD-10-CM | POA: Diagnosis not present

## 2019-01-23 DIAGNOSIS — E538 Deficiency of other specified B group vitamins: Secondary | ICD-10-CM | POA: Diagnosis not present

## 2019-01-23 DIAGNOSIS — N183 Chronic kidney disease, stage 3 unspecified: Secondary | ICD-10-CM

## 2019-01-23 DIAGNOSIS — D649 Anemia, unspecified: Secondary | ICD-10-CM | POA: Diagnosis not present

## 2019-01-23 LAB — CBC
HCT: 39.9 % (ref 39.0–52.0)
Hemoglobin: 13.2 g/dL (ref 13.0–17.0)
MCHC: 33.1 g/dL (ref 30.0–36.0)
MCV: 85.9 fl (ref 78.0–100.0)
Platelets: 171 10*3/uL (ref 150.0–400.0)
RBC: 4.65 Mil/uL (ref 4.22–5.81)
RDW: 15.1 % (ref 11.5–15.5)
WBC: 7.8 10*3/uL (ref 4.0–10.5)

## 2019-01-23 LAB — VITAMIN B12: Vitamin B-12: 867 pg/mL (ref 211–911)

## 2019-01-23 LAB — BASIC METABOLIC PANEL
BUN: 22 mg/dL (ref 6–23)
CO2: 29 mEq/L (ref 19–32)
Calcium: 9.1 mg/dL (ref 8.4–10.5)
Chloride: 106 mEq/L (ref 96–112)
Creatinine, Ser: 1.67 mg/dL — ABNORMAL HIGH (ref 0.40–1.50)
GFR: 40.37 mL/min — ABNORMAL LOW (ref >60.00)
Glucose, Bld: 108 mg/dL — ABNORMAL HIGH (ref 70–99)
Potassium: 4.8 mEq/L (ref 3.5–5.1)
Sodium: 143 mEq/L (ref 135–145)

## 2019-01-23 LAB — LDL CHOLESTEROL, DIRECT: Direct LDL: 63 mg/dL

## 2019-01-23 NOTE — Progress Notes (Signed)
Patient and lab staff wore face masks for visit per COVID-19 protocol.  

## 2019-01-24 LAB — IRON,TIBC AND FERRITIN PANEL
%SAT: 29 % (calc) (ref 20–48)
Ferritin: 60 ng/mL (ref 24–380)
Iron: 79 ug/dL (ref 50–180)
TIBC: 270 mcg/dL (calc) (ref 250–425)

## 2019-01-29 DIAGNOSIS — L89113 Pressure ulcer of right upper back, stage 3: Secondary | ICD-10-CM | POA: Diagnosis not present

## 2019-02-02 ENCOUNTER — Telehealth: Payer: Self-pay | Admitting: Adult Health

## 2019-02-02 NOTE — Telephone Encounter (Signed)
As he does not have access for video capability and has already been scheduled for telephone visit, I will do a telephone visit with patient on 02/04/2019 at 3:45 PM.

## 2019-02-02 NOTE — Telephone Encounter (Signed)
Kendal Hymen called in and stated that py has another appt scheduled for 05/06 at 11am / appt changed to 3:45pm , consent for telephone visit was given due to video capability not available  [phone]

## 2019-02-03 DIAGNOSIS — M79675 Pain in left toe(s): Secondary | ICD-10-CM | POA: Diagnosis not present

## 2019-02-03 DIAGNOSIS — B351 Tinea unguium: Secondary | ICD-10-CM | POA: Diagnosis not present

## 2019-02-03 DIAGNOSIS — M79674 Pain in right toe(s): Secondary | ICD-10-CM | POA: Diagnosis not present

## 2019-02-03 DIAGNOSIS — L600 Ingrowing nail: Secondary | ICD-10-CM | POA: Diagnosis not present

## 2019-02-04 ENCOUNTER — Ambulatory Visit (INDEPENDENT_AMBULATORY_CARE_PROVIDER_SITE_OTHER): Payer: BLUE CROSS/BLUE SHIELD | Admitting: Adult Health

## 2019-02-04 ENCOUNTER — Encounter: Payer: Self-pay | Admitting: Adult Health

## 2019-02-04 ENCOUNTER — Other Ambulatory Visit: Payer: Self-pay

## 2019-02-04 ENCOUNTER — Ambulatory Visit: Payer: BLUE CROSS/BLUE SHIELD | Admitting: Adult Health

## 2019-02-04 DIAGNOSIS — I63511 Cerebral infarction due to unspecified occlusion or stenosis of right middle cerebral artery: Secondary | ICD-10-CM | POA: Diagnosis not present

## 2019-02-04 DIAGNOSIS — E785 Hyperlipidemia, unspecified: Secondary | ICD-10-CM

## 2019-02-04 DIAGNOSIS — I10 Essential (primary) hypertension: Secondary | ICD-10-CM | POA: Diagnosis not present

## 2019-02-04 NOTE — Progress Notes (Signed)
I agree with the above plan 

## 2019-02-04 NOTE — Progress Notes (Signed)
[facility] [address] [place]. [place] [address] [phone]     Virtual Visit via Telephone Note  I connected with Ian Ramos on 02/04/19 at  3:45 PM EDT by telephone located remotely within my own home and verified that I am speaking with the correct person using two identifiers who reports being located within his own home and accompanied by his friend, Ian Ramos.    Visit scheduled by Sherrie George, RN. She discussed the limitations, risks, security and privacy concerns of performing an evaluation and management service by telephone and the availability of in person appointments. I also discussed with the patient that there may be a patient responsible charge related to this service. The patient expressed understanding and agreed to proceed. See telephone note for consent and additional scheduling information.    History of Present Illness:  Ian Ramos is a 73 y.o. male who has been followed in this office for multiple strokes secondary to hypoperfusion in the setting of high-grade right ICA stenosis s/p right carotid PTA/stent intervention and 06/2018.  He was initially scheduled for face-to-face office follow up visit today time but due to COVID19, visit rescheduled for non-face-to-face telephone visit with patients consent. Unable to participate in video visit due to lack of access to device with camera.   He has been stable from a stroke standpoint with overall recovery of his memory/cognition.  He is currently living with a friend as he was discharged from SNF in 08/2018.  He is able to function independently without difficulty and has even returned to driving.  Greatest complaint today is ongoing cervical issues which is because left arm weakness/paresthesias and plans on following with Dr. Jordan Likes for likely needed intervention.  He continues on aspirin and Plavix without side effects of bleeding or bruising.  Continues on atorvastatin without side  effects of myalgias.  Blood pressure has been stable with typical SBP's 120s.  He did have follow-up with vascular surgery 08/21/2018 and recommended repeat carotid duplex in 6 months time.  No further concerns at this time.  Denies new or worsening stroke/TIA symptoms.     Observations/Objective: Limited exam due to visit type  General: Elderly Caucasian male asking and answering questions appropriately throughout conversation Mental Status: Awake and fully alert. Oriented to place and time. Remote memory intact. Attention span, concentration and fund of knowledge appropriate.  Flat affect.    Assessment and Plan:  Ian Ramos is a 73 y.o. year old male here with left M1/M2 watershed, right MCA/PCA watershed and right parietal punctate infarcts on 06/06/2018 secondary to hypoperfusion in the setting of high-grade right ICA stenosis with left ICA occlusion and hypotension.  Patient did undergo right carotid PTA/stent intervention on 06/27/2018.  Vascular risk factors include ICA stenosis/occlusion, CAD, LDL and dilated cardiomyopathy with EF 35 to 40%.     He has been stable from a stroke standpoint without residual deficits or reoccurring of symptoms.   -Continue aspirin 81 mg daily and clopidogrel 75 mg daily  and atorvastatin 80 mg for secondary stroke prevention -recommended by vascular surgery to continue indefinitely -F/u with PCP regarding your HLD and HTN management -f/u with vascular surgery for repeat carotid ultrasound around 01/2019 -Advised to reach out to Dr. Lindalou Hose office in regards to ongoing cervical neck issues -continue to monitor BP at home -advised to continue to stay active and maintain a healthy diet -Maintain strict control of hypertension with blood pressure goal 1 30-1 50, diabetes with hemoglobin A1c goal  below 6.5% and cholesterol with LDL cholesterol (bad cholesterol) goal below 70 mg/dL. I also advised the patient to eat a healthy diet with plenty of whole  grains, cereals, fruits and vegetables, exercise regularly and maintain ideal body weight.   Follow Up Instructions:   Stable from stroke standpoint and recommend follow-up as needed    I discussed the assessment and treatment plan with the patient.  The patient was provided an opportunity to ask questions and all were answered to their satisfaction. The patient agreed with the plan and verbalized an understanding of the instructions.   I provided 26 minutes of non-face-to-face time during this encounter.    Ian Ramos, AGNP-BC  [facility] [address] [place], [place] [address]  Phone [phone] Fax [phone] Note: This document was prepared with digital dictation and possible smart phrase technology. Any transcriptional errors that result from this process are unintentional.

## 2019-02-19 ENCOUNTER — Encounter (HOSPITAL_COMMUNITY): Payer: BLUE CROSS/BLUE SHIELD

## 2019-02-19 ENCOUNTER — Ambulatory Visit: Payer: BLUE CROSS/BLUE SHIELD | Admitting: Family

## 2019-02-24 ENCOUNTER — Other Ambulatory Visit: Payer: Self-pay | Admitting: Family Medicine

## 2019-02-24 DIAGNOSIS — D649 Anemia, unspecified: Secondary | ICD-10-CM

## 2019-03-09 ENCOUNTER — Other Ambulatory Visit: Payer: Self-pay | Admitting: Family Medicine

## 2019-03-09 DIAGNOSIS — I420 Dilated cardiomyopathy: Secondary | ICD-10-CM

## 2019-03-09 DIAGNOSIS — I6521 Occlusion and stenosis of right carotid artery: Secondary | ICD-10-CM

## 2019-03-09 DIAGNOSIS — I2510 Atherosclerotic heart disease of native coronary artery without angina pectoris: Secondary | ICD-10-CM

## 2019-04-03 ENCOUNTER — Other Ambulatory Visit: Payer: Self-pay | Admitting: Family Medicine

## 2019-04-03 DIAGNOSIS — K219 Gastro-esophageal reflux disease without esophagitis: Secondary | ICD-10-CM

## 2019-04-30 ENCOUNTER — Telehealth: Payer: Self-pay | Admitting: Family Medicine

## 2019-04-30 DIAGNOSIS — K219 Gastro-esophageal reflux disease without esophagitis: Secondary | ICD-10-CM

## 2019-04-30 MED ORDER — PANTOPRAZOLE SODIUM 40 MG PO TBEC: 40.0000 mg | DELAYED_RELEASE_TABLET | Freq: Every day | ORAL | 3 refills | Status: DC

## 2019-04-30 NOTE — Telephone Encounter (Signed)
Sent in a prescription for protonix for his reflex. Discontinue the cimetidine. May try benadryl 25mg  every 8 hours as needed for the itchy scalp.

## 2019-04-30 NOTE — Telephone Encounter (Signed)
Pt was given script forcimetidine (TAGAMET) 400 MG tablet  Medicare advantage sent letter and said they approved the 1st time for a complementary approval but pt would need to get a cheaper option form his dr. This needs to be refilled but unless genic or other will not approve. Pt also got stung by a wasp and it is very itchy and swollen, no monies for appt can you send a script for a application for a wasp sting. Pt advised if not sent appt would need to be made.  [id number] - [address], [address] AT [facility] OF [address] [phone] (Phone) [phone] (Fax)

## 2019-04-30 NOTE — Telephone Encounter (Signed)
I called and spoke with Horris Latino. We went over the information below and she verbalized understanding.

## 2019-05-04 ENCOUNTER — Other Ambulatory Visit: Payer: Self-pay | Admitting: General Surgery

## 2019-05-04 DIAGNOSIS — Z9889 Other specified postprocedural states: Secondary | ICD-10-CM | POA: Diagnosis not present

## 2019-05-04 DIAGNOSIS — Z95828 Presence of other vascular implants and grafts: Secondary | ICD-10-CM | POA: Diagnosis not present

## 2019-05-04 DIAGNOSIS — L89113 Pressure ulcer of right upper back, stage 3: Secondary | ICD-10-CM | POA: Diagnosis not present

## 2019-05-04 DIAGNOSIS — Z8673 Personal history of transient ischemic attack (TIA), and cerebral infarction without residual deficits: Secondary | ICD-10-CM | POA: Diagnosis not present

## 2019-05-06 ENCOUNTER — Telehealth: Payer: Self-pay

## 2019-05-06 NOTE — Telephone Encounter (Signed)
   Primary Cardiologist:No primary care provider on file.  Chart reviewed as part of pre-operative protocol coverage. Because of Ian Ramos's past medical history and time since last visit, he/she will require a follow-up visit in order to better assess preoperative cardiovascular risk.  Pre-op covering staff: - Please schedule appointment with Dr. Abbey Chatters on her team and call patient to inform them. - Please contact requesting surgeon's office via preferred method (i.e, phone, fax) to inform them of need for appointment prior to surgery.  Will route this note to Dr. Oneida Alar (Vasc Sx) for recommendations regarding holding plavix given carotid artery stenting 06/05/2018. Patient has a history of CAD but no prior coronary artery interventions.   [name], PA-C  05/06/2019, 5:01 PM

## 2019-05-06 NOTE — Telephone Encounter (Signed)
   [facility] Pre-operative Risk Assessment    Request for surgical clearance:  1. What type of surgery is being performed? Excision of Skin Ulcer on Right Upper Back   2. When is this surgery scheduled? TBD   3. What type of clearance is required (medical clearance vs. Pharmacy clearance to hold med vs. Both)? Both  4. Are there any medications that need to be held prior to surgery and how long? Hold Plavix 5 days prior to procedure  5. Practice name and name of physician performing surgery? [facility]   6. What is your office phone number [phone]    7.   What is your office fax number [phone]  8.   Anesthesia type (None, local, MAC, general) ? Alger 05/06/2019, 4:33 PM  _________________________________________________________________   (provider comments below)

## 2019-05-07 NOTE — Telephone Encounter (Signed)
Per Roby Lofts, PA, this pt needs an appt with Dr. Radford Pax or APP. Thanks!

## 2019-05-07 NOTE — Telephone Encounter (Signed)
LMTCB on pt home number and on mobile number listed (Bonnie--girlfriend--ok per DPR) to schedule appt with Dr. Radford Pax for clearance.

## 2019-05-08 NOTE — Telephone Encounter (Signed)
Patient's girlfriend calledback, offer her appts in August she will not be in town to bring him, she took appt on 9/3 with APP.

## 2019-05-22 ENCOUNTER — Other Ambulatory Visit: Payer: Self-pay | Admitting: Family Medicine

## 2019-05-22 DIAGNOSIS — I10 Essential (primary) hypertension: Secondary | ICD-10-CM

## 2019-05-22 DIAGNOSIS — I2583 Coronary atherosclerosis due to lipid rich plaque: Secondary | ICD-10-CM

## 2019-05-22 DIAGNOSIS — I2510 Atherosclerotic heart disease of native coronary artery without angina pectoris: Secondary | ICD-10-CM

## 2019-05-22 DIAGNOSIS — D649 Anemia, unspecified: Secondary | ICD-10-CM

## 2019-05-22 DIAGNOSIS — I420 Dilated cardiomyopathy: Secondary | ICD-10-CM

## 2019-05-22 DIAGNOSIS — I6521 Occlusion and stenosis of right carotid artery: Secondary | ICD-10-CM

## 2019-05-26 ENCOUNTER — Telehealth: Payer: Self-pay | Admitting: Family Medicine

## 2019-05-26 NOTE — Telephone Encounter (Signed)
Does patient need a follow up appt?

## 2019-05-26 NOTE — Telephone Encounter (Signed)
He was due to follow up last month, they can schedule an appointment for when it works best for their schedule.

## 2019-05-27 NOTE — Telephone Encounter (Signed)
Tried to call, had to leave a message °

## 2019-06-04 ENCOUNTER — Other Ambulatory Visit: Payer: Self-pay

## 2019-06-04 ENCOUNTER — Ambulatory Visit: Payer: BLUE CROSS/BLUE SHIELD | Admitting: Cardiology

## 2019-06-04 ENCOUNTER — Encounter: Payer: Self-pay | Admitting: Cardiology

## 2019-06-04 ENCOUNTER — Ambulatory Visit (INDEPENDENT_AMBULATORY_CARE_PROVIDER_SITE_OTHER): Payer: Medicare Other | Admitting: Cardiology

## 2019-06-04 VITALS — BP 118/70 | HR 57 | Ht 67.0 in | Wt 177.8 lb

## 2019-06-04 DIAGNOSIS — Z01818 Encounter for other preprocedural examination: Secondary | ICD-10-CM

## 2019-06-04 DIAGNOSIS — I428 Other cardiomyopathies: Secondary | ICD-10-CM

## 2019-06-04 DIAGNOSIS — I2510 Atherosclerotic heart disease of native coronary artery without angina pectoris: Secondary | ICD-10-CM

## 2019-06-04 DIAGNOSIS — I6523 Occlusion and stenosis of bilateral carotid arteries: Secondary | ICD-10-CM | POA: Diagnosis not present

## 2019-06-04 DIAGNOSIS — E782 Mixed hyperlipidemia: Secondary | ICD-10-CM

## 2019-06-04 DIAGNOSIS — I447 Left bundle-branch block, unspecified: Secondary | ICD-10-CM

## 2019-06-04 MED ORDER — ASPIRIN 81 MG PO CHEW: 81.0000 mg | CHEWABLE_TABLET | Freq: Two times a day (BID) | ORAL | Status: DC

## 2019-06-04 NOTE — Progress Notes (Signed)
Cardiology Office Note   Date:  06/04/2019   ID:  Ian LyonJohn T Sinclair, DOB 06/26/1946, MRN [id number]  PCP:  Mliss SaxKremer, Ian Alfred, MD  Cardiologist:  Dr. Mayford Knifeurner     Chief Complaint  Patient presents with  . Pre-op Exam      History of Present Illness: Ian Ramos is a [age] y.o. male who presents for surgical clearance for back ulcer debridement.    Pt with history of chronic kidney disease stage III and cervical disc disease who underwent cervical laminectomy on 05/23/2018  On 05/29/18 he presented to ER with chest pain and LBBB  EF 35% .  Cardiac catheterization revealed moderate CAD as outlined with initial plan for medical therapy. Hospital course also complicated by stroke in the setting of high-grade ICA stenoses, hypoxic respiratory failure requiring mechanical ventilation.  He recovered and had Rt carotid stent.  06/27/18.   He has total occl of Lt carotid artery.    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   No SOB and no edema.  He has neck pain and bil. Arm numbness at times due to cervical disc disease.   He is followed by neurology for his CVA.   His back ulcer continues to bleed and was biopsied and neg for cancer.  Now to remove the ulcer.   He is on ASA for CAD but plavix for vascular stent.   Past Medical History:  Diagnosis Date  . Arthritis   . Neuromuscular disorder (HCC)   . Stroke [facility](HCC)     Past Surgical History:  Procedure Laterality Date  . CAROTID PTA/STENT INTERVENTION N/A 06/27/2018   Procedure: CAROTID PTA/STENT INTERVENTION;  Surgeon: Sherren KernsFields, Charles E, MD;  Location: [facility] INVASIVE CV LAB;  Service: Cardiovascular;  Laterality: N/A;  . LEFT HEART CATH AND CORONARY ANGIOGRAPHY N/A 06/05/2018   Procedure: LEFT HEART CATH AND CORONARY ANGIOGRAPHY;  Surgeon: SwazilandJordan, Peter M, MD;  [facility];  Service: Cardiovascular;  Laterality: N/A;  .  POSTERIOR CERVICAL FUSION/FORAMINOTOMY N/A 05/23/2018   Procedure: Posterior Cervical Fusion with lateral mass fixation - C1 - C6 with laminectomy;  Surgeon: Julio SicksPool, Henry, MD;  Location: [facility] OR;  Service: Neurosurgery;  Laterality: N/A;     Current Outpatient Medications  Medication Sig Dispense Refill  . acetaminophen (TYLENOL) 325 MG tablet Take 2 tablets (650 mg total) by mouth every 4 (four) hours as needed for headache or mild pain.    Marland Kitchen. atorvastatin (LIPITOR) 80 MG tablet PLACE 1 TABLET INTO FEEDING TUBE AT 6 PM 90 tablet 1  . B Complex Vitamins (B COMPLEX-B12) TABS Take one tablet daily 90 tablet 1  . carvedilol (COREG) 3.125 MG tablet TAKE 1 TABLET(3.125 MG) BY MOUTH TWICE DAILY WITH A MEAL 180 tablet 1  . cimetidine (TAGAMET) 400 MG tablet TAKE 1 TABLET(400 MG) BY MOUTH AT BEDTIME 30 tablet 2  . clopidogrel (PLAVIX) 75 MG tablet PLACE 1 TABLET INTO FEEDING TUBE DAILY 90 tablet 1  . FEROSUL 325 (65 Fe) MG tablet TAKE 1 TABLET(325 MG) BY MOUTH DAILY WITH BREAKFAST 90 tablet 1  . pantoprazole (PROTONIX) 40 MG tablet Take 1 tablet (40 mg total) by mouth daily. 30 tablet 3  . senna (SENOKOT) 8.6 MG TABS tablet Take 2 tablets (17.2 mg total) by mouth at bedtime as needed for mild constipation. 120 each 0  . aspirin 81 MG chewable  tablet Place 2 tablets (162 mg total) into feeding tube daily. (Patient not taking: Reported on 06/04/2019)     Current Facility-Administered Medications  Medication Dose Route Frequency Provider Last Rate Last Dose  . aspirin chewable tablet 81 mg  81 mg Oral BID Isaiah Serge, NP       Facility-Administered Medications Ordered in Other Visits  Medication Dose Route Frequency Provider Last Rate Last Dose  . etomidate (AMIDATE) injection    Anesthesia Intra-op Myna Bright, CRNA   12 mg at 06/09/18 1445  . succinylcholine (ANECTINE) injection    Anesthesia Intra-op Myna Bright, CRNA   100 mg at 06/09/18 1445    Allergies:   Patient has no known  allergies.    Social History:  The patient  reports that he has never smoked. His smokeless tobacco use includes chew. He reports that he does not drink alcohol or use drugs.   Family History:  The patient's family history includes CAD in his father; Heart failure in his father and mother; Hypertension in an other family member.    ROS:  General:no colds or fevers, no weight changes Skin:no rashes or ulcers HEENT:no blurred vision, no congestion CV:see HPI PUL:see HPI GI:no diarrhea constipation or melena, no indigestion GU:no hematuria, no dysuria MS:no joint pain, no claudication, + bil arm numbness, chronic neck pain and unable to move.  Neuro:no syncope, no lightheadedness Endo:no diabetes, no thyroid disease  Wt Readings from Last 3 Encounters:  06/04/19 177 lb 12.8 oz (80.6 kg)  11/06/18 173 lb 8 oz (78.7 kg)  10/06/18 172 lb 6 oz (78.2 kg)     PHYSICAL EXAM: VS:  BP 118/70   Pulse (!) 57   Ht 5\' 7"  (1.702 m)   Wt 177 lb 12.8 oz (80.6 kg)   SpO2 98%   BMI 27.85 kg/m  , BMI Body mass index is 27.85 kg/m. General:Pleasant affect, NAD Skin:Warm and dry, brisk capillary refill HEENT:normocephalic, sclera clear, mucus membranes moist Neck:supple, no JVD, no bruits  Heart:S1S2 RRR without murmur, gallup, rub or click Lungs:clear without rales, rhonchi, or wheezes IRC:VELF, non tender, + BS, do not palpate liver spleen or masses Ext:no lower ext edema, 2+ pedal pulses, 2+ radial pulses Neuro:alert and oriented, MAE, follows commands, + facial symmetry    EKG:  EKG is ordered today. The ekg ordered today demonstrates SB at 59 with 1st degree AV block non specific T wave abnormality.  No longer with LBBB.  Recent Labs: 06/16/2018: Magnesium 1.7 06/20/2018: ALT 27 01/23/2019: BUN 22; Creatinine, Ser 1.67; Hemoglobin 13.2; Platelets 171.0; Potassium 4.8; Sodium 143    Lipid Panel    Component Value Date/Time   CHOL 155 06/07/2018 0226   TRIG 154 (H) 06/07/2018 0226    HDL 31 (L) 06/07/2018 0226   CHOLHDL 5.0 06/07/2018 0226   VLDL 31 06/07/2018 0226   LDLCALC 93 06/07/2018 0226   LDLDIRECT 63.0 01/23/2019 1258       Other studies Reviewed: Additional studies/ records that were reviewed today include: . Cardiaccatheterization 06/05/2018:  Prox RCA to Mid RCA lesion is 30% stenosed.  Acute Mrg lesion is 100% stenosed.  Prox LAD lesion is 75% stenosed.  Ost 2nd Mrg to 2nd Mrg lesion is 75% stenosed.  LV end diastolic pressure is normal.   1. Moderate obstructive CAD - Eccentric 75% proximal LAD - 75% OM2 - Occluded RV marginal branch with left to right collaterals. 2. Normal LVEDP  Plan: Patient does not have critical  CAD. I would recommend medical management. If he has refractory angina despite optimal medical therapy would consider PCI of LAD. The OM lesion would be more difficult to treat since it involves bifurcation of a large OM1.  Echocardiogram 05/29/2018: Study Conclusions  - Left ventricle: The cavity size was normal. Systolic function was moderately reduced. The estimated ejection fraction was in the range of 35% to 40%. Diffuse hypokinesis. Doppler parameters are consistent with abnormal left ventricular relaxation (grade 1 diastolic dysfunction). Doppler parameters are consistent with elevated ventricular end-diastolic filling pressure. - Aortic valve: A bicuspid morphology cannot be excluded; severely thickened, severely calcified leaflets. There was trivial regurgitation. - Mitral valve: There was mild regurgitation. - Left atrium: The atrium was normal in size. - Right atrium: The atrium was normal in size. - Tricuspid valve: There was trivial regurgitation. - Pulmonary arteries: Systolic pressure was within the normal range. - Inferior vena cava: The vessel was dilated. The respirophasic diameter changes were blunted (<50%), consistent with elevated central venous pressure. -  Pericardium, extracardiac: There was no pericardial effusion.  ASSESSMENT AND PLAN:  1.  Pre-op pt has non obstructive CAD and on statin and BB, he is able to meet 4 METS of activity so for this surgery would be low risk.  He did have cardiomyopathy but asymptomatic.   Unfortunately we cannot clear for stopping Plavix.  He has 100% occlusion of Lt ICA and has stent in Rt carotid place 06/27/18 -  Dr. Darrick Penna would need to direct plavix.   The plavix is not for coronary disease but carotid.    2.  Dilated cardiomyopathy- pt asymptomatic not on ACE or ARB due to renal insuff.  Will recheck Echo but results not needed for surgery.    3.  Carotid disease per Dr. Darrick Penna   4.  HLD on statin continue followed by PCP and last LDL was 63  5.  LBBB resolved. On current EKG.  Follow up with Dr. Mayford Knife in 3-4 months after echo.        Current medicines are reviewed with the patient today.  The patient Has no concerns regarding medicines.  The following changes have been made:  See above Labs/ tests ordered today include:see above  Disposition:   FU:  see above  Signed, Nada Boozer, NP  06/04/2019 3:05 PM    [facility] [address] [place], [place], [place]  [address]/ [address] [place], [place] Phone: [phone]; Fax: [phone]  [phone]

## 2019-06-04 NOTE — Patient Instructions (Addendum)
Medication Instructions:   Your physician recommends that you continue on your current medications as directed. Please refer to the Current Medication list given to you today.  If you need a refill on your cardiac medications before your next appointment, please call your pharmacy.   Lab work:  None ordered today  If you have labs (blood work) drawn today and your tests are completely normal, you will receive your results only by: Marland Kitchen MyChart Message (if you have MyChart) OR . A paper copy in the mail If you have any lab test that is abnormal or we need to change your treatment, we will call you to review the results.  Testing/Procedures:  Your physician has requested that you have an echocardiogram on 08/06/19 at 2:05PM. Echocardiography is a painless test that uses sound waves to create images of your heart. It provides your doctor with information about the size and shape of your heart and how well your heart's chambers and valves are working. This procedure takes approximately one hour. There are no restrictions for this procedure.  Follow-Up:  With Dr. Fransico Him, MD on 09/04/19 at 2:00PM

## 2019-06-09 ENCOUNTER — Encounter (HOSPITAL_COMMUNITY): Payer: Self-pay | Admitting: Emergency Medicine

## 2019-06-09 ENCOUNTER — Emergency Department (HOSPITAL_COMMUNITY)
Admission: EM | Admit: 2019-06-09 | Discharge: 2019-06-10 | Disposition: A | Discharge status: Home / Self Care | Payer: Medicare Other | Attending: Emergency Medicine | Admitting: Emergency Medicine

## 2019-06-09 ENCOUNTER — Other Ambulatory Visit: Payer: Self-pay

## 2019-06-09 DIAGNOSIS — M79601 Pain in right arm: Secondary | ICD-10-CM | POA: Diagnosis not present

## 2019-06-09 DIAGNOSIS — R509 Fever, unspecified: Secondary | ICD-10-CM | POA: Insufficient documentation

## 2019-06-09 DIAGNOSIS — R350 Frequency of micturition: Secondary | ICD-10-CM | POA: Diagnosis not present

## 2019-06-09 DIAGNOSIS — Z79899 Other long term (current) drug therapy: Secondary | ICD-10-CM | POA: Diagnosis not present

## 2019-06-09 DIAGNOSIS — Z8673 Personal history of transient ischemic attack (TIA), and cerebral infarction without residual deficits: Secondary | ICD-10-CM | POA: Insufficient documentation

## 2019-06-09 DIAGNOSIS — N183 Chronic kidney disease, stage 3 (moderate): Secondary | ICD-10-CM | POA: Insufficient documentation

## 2019-06-09 DIAGNOSIS — I5020 Unspecified systolic (congestive) heart failure: Secondary | ICD-10-CM | POA: Insufficient documentation

## 2019-06-09 DIAGNOSIS — M19019 Primary osteoarthritis, unspecified shoulder: Secondary | ICD-10-CM | POA: Diagnosis not present

## 2019-06-09 DIAGNOSIS — F17220 Nicotine dependence, chewing tobacco, uncomplicated: Secondary | ICD-10-CM | POA: Diagnosis not present

## 2019-06-09 DIAGNOSIS — I130 Hypertensive heart and chronic kidney disease with heart failure and stage 1 through stage 4 chronic kidney disease, or unspecified chronic kidney disease: Secondary | ICD-10-CM | POA: Insufficient documentation

## 2019-06-09 DIAGNOSIS — I252 Old myocardial infarction: Secondary | ICD-10-CM | POA: Diagnosis not present

## 2019-06-09 DIAGNOSIS — M25511 Pain in right shoulder: Secondary | ICD-10-CM | POA: Diagnosis not present

## 2019-06-09 DIAGNOSIS — R531 Weakness: Secondary | ICD-10-CM | POA: Diagnosis not present

## 2019-06-09 DIAGNOSIS — R079 Chest pain, unspecified: Secondary | ICD-10-CM | POA: Diagnosis not present

## 2019-06-09 HISTORY — DX: Acute myocardial infarction, unspecified: I21.9

## 2019-06-09 LAB — CBC WITH DIFFERENTIAL/PLATELET
Abs Immature Granulocytes: 0.04 10*3/uL (ref 0.00–0.07)
Basophils Absolute: 0 10*3/uL (ref 0.0–0.1)
Basophils Relative: 0 %
Eosinophils Absolute: 0 10*3/uL (ref 0.0–0.5)
Eosinophils Relative: 0 %
HCT: 39.3 % (ref 39.0–52.0)
Hemoglobin: 13 g/dL (ref 13.0–17.0)
Immature Granulocytes: 0 %
Lymphocytes Relative: 11 %
Lymphs Abs: 1.3 10*3/uL (ref 0.7–4.0)
MCH: 28.8 pg (ref 26.0–34.0)
MCHC: 33.1 g/dL (ref 30.0–36.0)
MCV: 87.1 fL (ref 80.0–100.0)
Monocytes Absolute: 1.3 10*3/uL — ABNORMAL HIGH (ref 0.1–1.0)
Monocytes Relative: 12 %
Neutro Abs: 8.7 10*3/uL — ABNORMAL HIGH (ref 1.7–7.7)
Neutrophils Relative %: 77 %
Platelets: 167 10*3/uL (ref 150–400)
RBC: 4.51 MIL/uL (ref 4.22–5.81)
RDW: 12.8 % (ref 11.5–15.5)
WBC: 11.4 10*3/uL — ABNORMAL HIGH (ref 4.0–10.5)
nRBC: 0 % (ref 0.0–0.2)

## 2019-06-09 LAB — COMPREHENSIVE METABOLIC PANEL
ALT: 15 U/L (ref 0–44)
AST: 15 U/L (ref 15–41)
Albumin: 3.4 g/dL — ABNORMAL LOW (ref 3.5–5.0)
Alkaline Phosphatase: 95 U/L (ref 38–126)
Anion gap: 11 (ref 5–15)
BUN: 19 mg/dL (ref 8–23)
CO2: 19 mmol/L — ABNORMAL LOW (ref 22–32)
Calcium: 8.6 mg/dL — ABNORMAL LOW (ref 8.9–10.3)
Chloride: 98 mmol/L (ref 98–111)
Creatinine, Ser: 1.74 mg/dL — ABNORMAL HIGH (ref 0.61–1.24)
GFR calc Af Amer: 44 mL/min — ABNORMAL LOW (ref >60)
GFR calc non Af Amer: 38 mL/min — ABNORMAL LOW (ref >60)
Glucose, Bld: 196 mg/dL — ABNORMAL HIGH (ref 70–99)
Potassium: 3.6 mmol/L (ref 3.5–5.1)
Sodium: 128 mmol/L — ABNORMAL LOW (ref 135–145)
Total Bilirubin: 1.3 mg/dL — ABNORMAL HIGH (ref 0.3–1.2)
Total Protein: 6.8 g/dL (ref 6.5–8.1)

## 2019-06-09 LAB — URINALYSIS, ROUTINE W REFLEX MICROSCOPIC
Bilirubin Urine: NEGATIVE
Glucose, UA: NEGATIVE mg/dL
Hgb urine dipstick: NEGATIVE
Ketones, ur: NEGATIVE mg/dL
Leukocytes,Ua: NEGATIVE
Nitrite: NEGATIVE
Protein, ur: NEGATIVE mg/dL
Specific Gravity, Urine: 1.015 (ref 1.005–1.030)
pH: 5 (ref 5.0–8.0)

## 2019-06-09 MED ORDER — ACETAMINOPHEN 325 MG PO TABS
650.0000 mg | ORAL_TABLET | Freq: Once | ORAL | Status: AC | PRN
  Administered 2019-06-09: 650 mg via ORAL
  Filled 2019-06-09: qty 2

## 2019-06-09 MED ORDER — SODIUM CHLORIDE 0.9% FLUSH: 3.0000 mL | Freq: Once | INTRAVENOUS | Status: DC

## 2019-06-09 NOTE — ED Triage Notes (Signed)
C/O of generalized weakness with right arm pain. Pt also reports frequent urination. Temp of 101 on arrival.

## 2019-06-10 ENCOUNTER — Emergency Department (HOSPITAL_COMMUNITY): Payer: Medicare Other

## 2019-06-10 DIAGNOSIS — M25511 Pain in right shoulder: Secondary | ICD-10-CM | POA: Diagnosis not present

## 2019-06-10 DIAGNOSIS — R079 Chest pain, unspecified: Secondary | ICD-10-CM | POA: Diagnosis not present

## 2019-06-10 LAB — BRAIN NATRIURETIC PEPTIDE: B Natriuretic Peptide: 83.7 pg/mL (ref 0.0–100.0)

## 2019-06-10 LAB — TROPONIN I (HIGH SENSITIVITY)
Troponin I (High Sensitivity): 18 ng/L — ABNORMAL HIGH (ref <18)
Troponin I (High Sensitivity): 16 ng/L (ref <18)

## 2019-06-10 LAB — SARS CORONAVIRUS 2 BY RT PCR (HOSPITAL ORDER, PERFORMED IN ~~LOC~~ HOSPITAL LAB): SARS Coronavirus 2: NEGATIVE

## 2019-06-10 NOTE — ED Notes (Signed)
Pt transported to Xray. 

## 2019-06-10 NOTE — ED Provider Notes (Signed)
[facility] Provider Note   CSN: [id number] Arrival date & time: 06/09/19  2119     History   Chief Complaint Chief Complaint  Patient presents with  . Weakness  . Urinary Frequency    HPI Ethelle LyonJohn T Modica is a 73 y.o. male.     Patient presents to the emergency department with a chief complaint of generalized weakness and urinary frequency.  He states that the weakness has been ongoing for a few days, but the urinary frequency began today.  He states that he has been drinking a lot of water, but still reports urinating more than normal.  It was noted that he had temperature of 101 in triage.  He denies any fever at home.  Denies any chills.  Denies any cough, nausea, vomiting, or diarrhea.  He states that he does have some pain in his right arm.  States this does not radiate.  He also has a chronic wound on his right upper back.  The history is provided by the patient. No language interpreter was used.   Jackquline DenmarkJohn T Quinonez was evaluated in Emergency Department on 06/10/2019 for the symptoms described in the history of present illness. He was evaluated in the context of the global COVID-19 pandemic, which necessitated consideration that the patient might be at risk for infection with the SARS-CoV-2 virus that causes COVID-19. Institutional protocols and algorithms that pertain to the evaluation of patients at risk for COVID-19 are in a state of rapid change based on information released by regulatory bodies including the CDC and federal and state organizations. These policies and algorithms were followed during the patient's care in the ED.   Past Medical History:  Diagnosis Date  . Arthritis   . MI (myocardial infarction) (HCC)   . Neuromuscular disorder (HCC)   . Stroke [facility](HCC)     Patient Active Problem List   Diagnosis Date Noted  . Skin lesion of back 10/06/2018  . B12 deficiency 09/01/2018  . Iron deficiency 09/01/2018  . Other  fatigue 09/01/2018  . Right-sided extracranial carotid artery stenosis 06/27/2018  . Right middle cerebral artery stroke (HCC) 06/19/2018  . Spondylosis, cervical, with myelopathy   . Neurologic gait disorder   . Essential hypertension   . On mechanically assisted ventilation (HCC)   . Acute systolic CHF (congestive heart failure) (HCC)   . Coronary artery disease due to lipid rich plaque   . DCM (dilated cardiomyopathy) (HCC)   . CKD (chronic kidney disease) stage 3, GFR 30-59 ml/min (HCC) 05/28/2018  . Esophageal thickening 05/28/2018  . LBBB (left bundle branch block) 05/28/2018  . Anemia 05/28/2018  . Status post cervical spinal fusion 05/23/2018  . Cervical myelopathy (HCC) 05/23/2018  . Left arm weakness 09/20/2017    Past Surgical History:  Procedure Laterality Date  . CAROTID PTA/STENT INTERVENTION N/A 06/27/2018   Procedure: CAROTID PTA/STENT INTERVENTION;  Surgeon: Sherren KernsFields, Charles E, MD;  Location: [facility] INVASIVE CV LAB;  Service: Cardiovascular;  Laterality: N/A;  . LEFT HEART CATH AND CORONARY ANGIOGRAPHY N/A 06/05/2018   Procedure: LEFT HEART CATH AND CORONARY ANGIOGRAPHY;  Surgeon: SwazilandJordan, Peter M, MD;  [facility];  Service: Cardiovascular;  Laterality: N/A;  . POSTERIOR CERVICAL FUSION/FORAMINOTOMY N/A 05/23/2018   Procedure: Posterior Cervical Fusion with lateral mass fixation - C1 - C6 with laminectomy;  Surgeon: Julio SicksPool, Henry, MD;  Location: [facility] OR;  Service: Neurosurgery;  Laterality: N/A;        Home Medications  Prior to Admission medications   Medication Sig Start Date End Date Taking? Authorizing Provider  acetaminophen (TYLENOL) 325 MG tablet Take 2 tablets (650 mg total) by mouth every 4 (four) hours as needed for headache or mild pain. 06/27/18   Angiulli, Lavon Paganini, PA-C  aspirin 81 MG chewable tablet Place 2 tablets (162 mg total) into feeding tube daily. Patient not taking: Reported on 06/04/2019 06/27/18   Cathlyn Parsons, PA-C   atorvastatin (LIPITOR) 80 MG tablet PLACE 1 TABLET INTO FEEDING TUBE AT 6 PM 03/09/19   Libby Maw, MD  B Complex Vitamins (B COMPLEX-B12) TABS Take one tablet daily 09/15/18   Libby Maw, MD  carvedilol (COREG) 3.125 MG tablet TAKE 1 TABLET(3.125 MG) BY MOUTH TWICE DAILY WITH A MEAL 05/22/19   Libby Maw, MD  cimetidine (TAGAMET) 400 MG tablet TAKE 1 TABLET(400 MG) BY MOUTH AT BEDTIME 04/04/19   Libby Maw, MD  clopidogrel (PLAVIX) 75 MG tablet PLACE 1 TABLET INTO FEEDING TUBE DAILY 05/22/19   Libby Maw, MD  FEROSUL 325 (65 Fe) MG tablet TAKE 1 TABLET(325 MG) BY MOUTH DAILY WITH BREAKFAST 05/22/19   Libby Maw, MD  pantoprazole (PROTONIX) 40 MG tablet Take 1 tablet (40 mg total) by mouth daily. 04/30/19   Libby Maw, MD  senna (SENOKOT) 8.6 MG TABS tablet Take 2 tablets (17.2 mg total) by mouth at bedtime as needed for mild constipation. 06/27/18   Angiulli, Lavon Paganini, PA-C    Family History Family History  Problem Relation Age of Onset  . Hypertension Other   . Heart failure Mother   . CAD Father        s/p CABG in his 57s  . Heart failure Father     Social History Social History   Tobacco Use  . Smoking status: Never Smoker  . Smokeless tobacco: Current User    Types: Chew  Substance Use Topics  . Alcohol use: No    Frequency: Never  . Drug use: No     Allergies   Patient has no known allergies.   Review of Systems Review of Systems  All other systems reviewed and are negative.    Physical Exam Updated Vital Signs BP 121/61   Pulse (!) 58   Temp 98 F (36.7 C) (Oral)   Resp 18   Ht 5\' 7"  (1.702 m)   Wt 80.7 kg   SpO2 96%   BMI 27.85 kg/m   Physical Exam Vitals signs and nursing note reviewed.  Constitutional:      Appearance: He is well-developed.  HENT:     Head: Normocephalic and atraumatic.  Eyes:     Conjunctiva/sclera: Conjunctivae normal.  Neck:     Musculoskeletal:  Neck supple.  Cardiovascular:     Rate and Rhythm: Normal rate and regular rhythm.     Heart sounds: No murmur.  Pulmonary:     Effort: Pulmonary effort is normal. No respiratory distress.     Breath sounds: Normal breath sounds.     Comments: CTAB Abdominal:     Palpations: Abdomen is soft.     Tenderness: There is no abdominal tenderness.     Comments: No focal abdominal tenderness, no RLQ tenderness or pain at McBurney's point, no RUQ tenderness or Murphy's sign, no left-sided abdominal tenderness, no fluid wave, or signs of peritonitis   Musculoskeletal: Normal range of motion.     Comments: Trace pitting edema in bilateral lower extremities  Skin:  General: Skin is warm and dry.     Comments: Small chronic ulcer/open wound to right upper back overlying scapula, no discharge, no evidence of cellulitis  Neurological:     Mental Status: He is alert and oriented to person, place, and time.  Psychiatric:        Mood and Affect: Mood normal.        Behavior: Behavior normal.      ED Treatments / Results  Labs (all labs ordered are listed, but only abnormal results are displayed) Labs Reviewed  COMPREHENSIVE METABOLIC PANEL - Abnormal; Notable for the following components:      Result Value   Sodium 128 (*)    CO2 19 (*)    Glucose, Bld 196 (*)    Creatinine, Ser 1.74 (*)    Calcium 8.6 (*)    Albumin 3.4 (*)    Total Bilirubin 1.3 (*)    GFR calc non Af Amer 38 (*)    GFR calc Af Amer 44 (*)    All other components within normal limits  CBC WITH DIFFERENTIAL/PLATELET - Abnormal; Notable for the following components:   WBC 11.4 (*)    Neutro Abs 8.7 (*)    Monocytes Absolute 1.3 (*)    All other components within normal limits  SARS CORONAVIRUS 2 (HOSPITAL ORDER, PERFORMED IN [facility])  URINALYSIS, ROUTINE W REFLEX MICROSCOPIC  BRAIN NATRIURETIC PEPTIDE  TROPONIN I (HIGH SENSITIVITY)    EKG None  Radiology Dg Chest Portable 1 View  Result  Date: 06/10/2019 CLINICAL DATA:  Chest pain EXAM: PORTABLE CHEST 1 VIEW COMPARISON:  06/12/2018 FINDINGS: Normal heart size and mediastinal contours. No acute infiltrate or edema. No effusion or pneumothorax. No acute osseous findings. Spondylosis. IMPRESSION: No active disease. Electronically Signed   By: Marnee SpringJonathon  Watts M.D.   On: 06/10/2019 05:31    Procedures Procedures (including critical care time)  Medications Ordered in ED Medications  sodium chloride flush (NS) 0.9 % injection 3 mL (has no administration in time range)  acetaminophen (TYLENOL) tablet 650 mg (650 mg Oral Given 06/09/19 2146)     Initial Impression / Assessment and Plan / ED Course  I have reviewed the triage vital signs and the nursing notes.  Pertinent labs & imaging results that were available during my care of the patient were reviewed by me and considered in my medical decision making (see chart for details).        Patient here with generalized weakness over a few days, and urinary frequency today.  He denies dysuria or hematuria.  He is noted to have fever in triage of 101.  He has mild leukocytosis to 11.  He appears a bit dehydrated, sodium 128, creatinine 1.74, increased from his usual.  Additionally, patient does have some mild peripheral edema on his lower extremities.  He does have a history of CHF, but does not sound fluid overloaded.  O2 saturation is normal.  He is in no respiratory distress.  We will check chest x-ray, EKG, troponin, and COVID-19.  Chest x-ray negative.  COVID-19 test negative.  Initial troponin is 18, will need repeat troponin.  If no significant change, plan for discharge with close outpatient follow-up.  If troponin trending up, will need admission.  Patient symptoms are thought to be viral at this time.  Urine culture is pending.  Patient signed out to [place], [place]C.  Final Clinical Impressions(s) / ED Diagnoses   Final diagnoses:  None    ED  Discharge Orders    None        Roxy Horseman, PA-C 06/10/19 1601    Dione Booze, MD 06/10/19 [phone]

## 2019-06-10 NOTE — ED Notes (Signed)
Family at bedside. 

## 2019-06-10 NOTE — ED Notes (Signed)
Pt able to ambulating for 3 min with a consistent heart rate of 94 at 100% room air. Nurse notified

## 2019-06-10 NOTE — Discharge Instructions (Signed)
You were seen in the ER for weakness and right arm pain.  You are found to have a fever when you got to the ER.  He denies any symptoms of infection.  Your urinalysis did not show any infection.  Your chest x-ray did not show infection.  Your COVID-19 test is negative.  Blood work today was reassuring.  Your cardiac enzymes were moderately elevated but there was no significant increase on your second test.  X-ray of your right shoulder said moderate arthritis of your [facility] joint in your shoulder and a bony spur.  This is likely causing your right shoulder pain.  We will treat arthritis with over-the-counter medicine such as acetaminophen and ibuprofen if you do not have any contraindications to take these.  Ice.  Follow-up with your primary care doctor if your shoulder pain does not improve.

## 2019-06-10 NOTE — ED Provider Notes (Signed)
0715: Hande from previous ED PA at shift change pending repeat troponin around 07 100.  Please see previous note for full details.  73 year old M is here for 1 week history of fatigue.  No infectious symptoms.  No chest pain but reported right arm pain.  Found to be febrile 101. Physical Exam  BP 112/62   Pulse (!) 57   Temp 98.5 F (36.9 C) (Oral)   Resp (!) 21   Ht 5\' 7"  (1.702 m)   Wt 80.7 kg   SpO2 97%   BMI 27.85 kg/m   Physical Exam Constitutional:      Appearance: He is well-developed.     Comments: Patient found asleep but easily arousable to voice.  Wife at bedside.  HENT:     Head: Normocephalic.     Nose: Nose normal.  Eyes:     General: Lids are normal.  Neck:     Musculoskeletal: Normal range of motion.  Cardiovascular:     Rate and Rhythm: Normal rate.     Comments: 1+ radial and DP pulses bilaterally.  No lower extremity edema.  No calf tenderness. Pulmonary:     Effort: Pulmonary effort is normal. No respiratory distress.  Abdominal:     General: Abdomen is flat.     Palpations: Abdomen is soft.     Tenderness: There is no abdominal tenderness.  Musculoskeletal: Normal range of motion.        General: Tenderness present.     Comments: Right shoulder: Mild focal tenderness to the distal clavicle, AC joint, anterior shoulder.  He has decreased range of motion past 90 degrees abduction and flexion secondary to pain.  Mild asymmetry/edema to the lateral anterior shoulder compared to left.  No joint erythema, warmth, fluctuance, wounds.  Abduction, flexion, abduction strength against resistance is intact and symmetric bilaterally at the shoulders.  No humerus tenderness.  Patient is able to externally rotate the shoulder with minimal discomfort.  No scapular tenderness. Right elbow/wrist/hand: No focal bony tenderness to these joints.  Full passive ROM of these joints without pain.  No joint erythema, edema, warmth.  Neurological:     Mental Status: He is alert.    Comments: Strength and sensation intact in upper and lower extremities, subtle decreased in strength in the right upper extremity secondary to pain with movement in the shoulder.  Psychiatric:        Behavior: Behavior normal.     ED Course/Procedures   Clinical Course as of Jun 09 999  Wed Jun 10, 2019  0944 Troponin I (High Sensitivity): 16 [CG]  0944 Creatinine(!): 1.74 [CG]  0944 Glucose(!): 196 [CG]  0944 Total Bilirubin(!): 1.3 [CG]  0944 Alkaline Phosphatase: 95 [CG]  0944 ALT: 15 [CG]  0944 AST: 15 [CG]  0944 Negative   DG Chest Portable 1 View [CG]  0945 1. No fracture or malalignment. 2. Moderate AC joint and glenohumeral joint osteoarthritis. 3. Faint calcification overlying the superior right glenohumeral joint, cannot exclude chondrocalcinosis from CPPD. 4. Tiny subacromial spur.  DG Shoulder Right [CG]  0945 Sinus rhythm Prolonged PR interval Nonspecific repol abnormality, inferior leads When compared with ECG of 06/13/2018, Left bundle branch block is no longer present Confirmed by Delora Fuel ([id number]) on 06/10/2019 6:52:48 AM  EKG 12-Lead [CG]  0945 B Natriuretic Peptide: 83.7 [CG]  0945 SARS Coronavirus 2: NEGATIVE [CG]    Clinical Course User Index [CG] Kinnie Feil, PA-C    Procedures  MDM   [id number]: ER  work-up thus far reviewed by me remarkable for mild hyponatremia, hyperglycemia without DKA, mildly elevated creatinine which is his baseline and mild leukocytosis 11.4.  Chest x-ray is without infection.  UA is without infection.  COVID is negative. EKG without ischemic changes, signs of pericarditis, arrhythmias.  Trop 18, pending delta. BNP normal. He was given Tylenol and his vital signs have normalized.  Source of fever is unclear.  I reviewed patient's EMR to obtain pertinent PMH.  He had a cardiac catheterization 1 year ago that showed moderate obstructive CAD, medically managed by cards.  He had an echocardiogram with an EF of 35 to 40% and  grade 1 diastolic dysfunction, severely thickened AV.  Will repeat trop, ambulate, reassess.   1030: Troponin is decreased 16.  Patient ambulated for 3 to 5 minutes with normal heart rate and SPO2.  He denied any exertional chest pain or shortness of breath.  Patient tells me he has been weak ever since his hospitalization one year ago.  Feels generally weak "no energy" all the time but this is not exertional.  Specifically he denies any exertional chest pain, shortness of breath, lightheadedness, syncope.  He denies having any chest pain.  He denies any infectious symptoms.  His right arm pain began 2 days ago and noticed while he was in bed, worse with movement and palpation of shoulder.  Exam reveals reproducible pain with movement, decreased ROM of the shoulder and mild edema.  Given this and fever, x-rays were obtained which show arthritis and bony spur.  No joint effusion.  He has decreased range of motion secondary to pain but he has no leukocytosis, other clinical signs of septic arthritis.  I doubt that this is the cause of the fever.  I doubt septic arthritis, gout.    He does have known moderate CAD and HF but given chronicity of weakness, benign work-up I really doubt ACS as he has no chest pain in his right arm pain appears to be MSK/soft tissue related and localized to the right shoulder.  No signs of HF decompensation today - normal BNP, no hypervolemia, hypoxemia on ambulation.  Will DC with close PCP follow-up.  Return precautions given.  Patient and wife are in agreement with this.   Liberty Handy, PA-C 06/10/19 1037    Arby Barrette, MD 06/12/19 1150

## 2019-06-11 LAB — URINE CULTURE: Culture: NO GROWTH

## 2019-06-16 DIAGNOSIS — Z0120 Encounter for dental examination and cleaning without abnormal findings: Secondary | ICD-10-CM | POA: Diagnosis not present

## 2019-06-26 ENCOUNTER — Encounter: Payer: Self-pay | Admitting: Family Medicine

## 2019-06-26 ENCOUNTER — Other Ambulatory Visit: Payer: Self-pay

## 2019-06-26 ENCOUNTER — Ambulatory Visit (INDEPENDENT_AMBULATORY_CARE_PROVIDER_SITE_OTHER): Payer: Medicare Other | Admitting: Family Medicine

## 2019-06-26 VITALS — BP 120/70 | HR 66 | Ht 67.0 in | Wt 177.0 lb

## 2019-06-26 DIAGNOSIS — G959 Disease of spinal cord, unspecified: Secondary | ICD-10-CM | POA: Diagnosis not present

## 2019-06-26 DIAGNOSIS — S161XXA Strain of muscle, fascia and tendon at neck level, initial encounter: Secondary | ICD-10-CM | POA: Insufficient documentation

## 2019-06-26 DIAGNOSIS — Z23 Encounter for immunization: Secondary | ICD-10-CM

## 2019-06-26 MED ORDER — TRAMADOL HCL 50 MG PO TABS: 50.0000 mg | ORAL_TABLET | Freq: Four times a day (QID) | ORAL | 0 refills | Status: AC | PRN

## 2019-06-26 MED ORDER — METHOCARBAMOL 500 MG PO TABS: 500.0000 mg | ORAL_TABLET | Freq: Three times a day (TID) | ORAL | 0 refills | Status: DC | PRN

## 2019-06-26 NOTE — Progress Notes (Signed)
Established Patient Office Visit  Subjective:  Patient ID: Ian Ramos, male    DOB: 07/31/1946  Age: 73 y.o. MRN: [id number]  CC:  Chief Complaint  Patient presents with  . Neck Pain    x 1-2 weeks, no known injury    HPI Ian Ramos presents for evaluation treatment of pain and stiffness in his right posterior neck.  There is been no injury.  Ongoing cervical myelopathy with paresthesias in both upper extremities with accompanying weakness.  Last surgical procedure was about a year ago.  Further surgery has been recommended but he is holding off.  There is currently no radiation of pain from his neck down into his arms.  Existing paresthesias have not changed.  Strength seems to be about the same and has not changed..  Past Medical History:  Diagnosis Date  . Arthritis   . MI (myocardial infarction) ([place])   . Neuromuscular disorder ([place])   . Stroke [facility])     Past Surgical History:  Procedure Laterality Date  . CAROTID PTA/STENT INTERVENTION N/A 06/27/2018   Procedure: CAROTID PTA/STENT INTERVENTION;  Surgeon: Elam Dutch, MD;  Location: [place] [facility];  Service: Cardiovascular;  Laterality: N/A;  . LEFT HEART CATH AND CORONARY ANGIOGRAPHY N/A 06/05/2018   Procedure: LEFT HEART CATH AND CORONARY ANGIOGRAPHY;  Surgeon: Martinique, Peter M, MD;  Location: [place] [facility];  Service: Cardiovascular;  Laterality: N/A;  . POSTERIOR CERVICAL FUSION/FORAMINOTOMY N/A 05/23/2018   Procedure: Posterior Cervical Fusion with lateral mass fixation - C1 - C6 with laminectomy;  Surgeon: Earnie Larsson, MD;  Location: [place];  Service: Neurosurgery;  Laterality: N/A;    Family History  Problem Relation Age of Onset  . Hypertension Other   . Heart failure Mother   . CAD Father        s/p CABG in his 84s  . Heart failure Father     Social History   Socioeconomic History  . Marital status: Divorced    Spouse name: Not on file  . Number of children: Not on file  . Years  of education: Not on file  . Highest education level: Not on file  Occupational History  . Not on file  Social Needs  . Financial resource strain: Not on file  . Food insecurity    Worry: Not on file    Inability: Not on file  . Transportation needs    Medical: Not on file    Non-medical: Not on file  Tobacco Use  . Smoking status: Never Smoker  . Smokeless tobacco: Current User    Types: Chew  Substance and Sexual Activity  . Alcohol use: No    Frequency: Never  . Drug use: No  . Sexual activity: Not on file  Lifestyle  . Physical activity    Days per week: Patient refused    Minutes per session: Patient refused  . Stress: Patient refused  Relationships  . Social Herbalist on phone: Not on file    Gets together: Not on file    Attends religious service: Not on file    Active member of club or organization: Not on file    Attends meetings of clubs or organizations: Not on file    Relationship status: Not on file  . Intimate partner violence    Fear of current or ex partner: Not on file    Emotionally abused: Not on file    Physically abused: Not on  file    Forced sexual activity: Not on file  Other Topics Concern  . Not on file  Social History Narrative   NA    Outpatient Medications Prior to Visit  Medication Sig Dispense Refill  . acetaminophen (TYLENOL) 325 MG tablet Take 2 tablets (650 mg total) by mouth every 4 (four) hours as needed for headache or mild pain.    Marland Kitchen. aspirin 81 MG chewable tablet Place 2 tablets (162 mg total) into feeding tube daily. (Patient taking differently: Chew 81 mg by mouth 2 (two) times daily. )    . atorvastatin (LIPITOR) 80 MG tablet PLACE 1 TABLET INTO FEEDING TUBE AT 6 PM (Patient taking differently: Take 80 mg by mouth daily at 6 PM. ) 90 tablet 1  . B Complex Vitamins (B COMPLEX-B12) TABS Take one tablet daily 90 tablet 1  . carvedilol (COREG) 3.125 MG tablet TAKE 1 TABLET(3.125 MG) BY MOUTH TWICE DAILY WITH A MEAL  (Patient taking differently: Take 3.125 mg by mouth 2 (two) times daily with a meal. ) 180 tablet 1  . clopidogrel (PLAVIX) 75 MG tablet PLACE 1 TABLET INTO FEEDING TUBE DAILY (Patient taking differently: Take 75 mg by mouth daily. ) 90 tablet 1  . FEROSUL 325 (65 Fe) MG tablet TAKE 1 TABLET(325 MG) BY MOUTH DAILY WITH BREAKFAST (Patient taking differently: Take 325 mg by mouth daily with breakfast. ) 90 tablet 1  . pantoprazole (PROTONIX) 40 MG tablet Take 1 tablet (40 mg total) by mouth daily. 30 tablet 3  . senna (SENOKOT) 8.6 MG TABS tablet Take 2 tablets (17.2 mg total) by mouth at bedtime as needed for mild constipation. 120 each 0  . cimetidine (TAGAMET) 400 MG tablet TAKE 1 TABLET(400 MG) BY MOUTH AT BEDTIME 30 tablet 2   Facility-Administered Medications Prior to Visit  Medication Dose Route Frequency Provider Last Rate Last Dose  . aspirin chewable tablet 81 mg  81 mg Oral BID Leone BrandIngold, Laura R, NP      . etomidate (AMIDATE) injection    Anesthesia Intra-op Lucinda Dellecarlo, Valerie M, CRNA   12 mg at 06/09/18 1445  . succinylcholine (ANECTINE) injection    Anesthesia Intra-op Lucinda Dellecarlo, Valerie M, CRNA   100 mg at 06/09/18 1445    No Known Allergies  ROS Review of Systems  Constitutional: Negative.   Respiratory: Negative.   Cardiovascular: Negative.   Gastrointestinal: Negative.   Genitourinary: Negative.   Musculoskeletal: Positive for neck pain and neck stiffness. Negative for myalgias.  Skin: Negative for color change and rash.  Neurological: Positive for weakness and numbness. Negative for seizures, speech difficulty, light-headedness and headaches.  Hematological: Negative.   Psychiatric/Behavioral: Negative.       Objective:    Physical Exam  Constitutional: He is oriented to person, place, and time. He appears well-developed and well-nourished. No distress.  HENT:  Head: Normocephalic and atraumatic.  Right Ear: External ear normal.  Left Ear: External ear normal.  Eyes:  Right eye exhibits no discharge. Left eye exhibits no discharge. No scleral icterus.  Neck: No JVD present. No tracheal deviation present.  Pulmonary/Chest: Effort normal. No stridor.  Musculoskeletal:     Left shoulder: He exhibits decreased range of motion.     Cervical back: He exhibits decreased range of motion and tenderness. He exhibits no bony tenderness and no swelling.       Back:       Arms:  Neurological: He is alert and oriented to person, place, and time.  Bilateral biceps, triceps and grip strengths 5/5.    Skin: Skin is warm and dry. He is not diaphoretic.  Psychiatric: He has a normal mood and affect. His behavior is normal.    BP 120/70   Pulse 66   Ht 5\' 7"  (1.702 m)   Wt 177 lb (80.3 kg)   SpO2 99%   BMI 27.72 kg/m  Wt Readings from Last 3 Encounters:  06/26/19 177 lb (80.3 kg)  06/09/19 177 lb 12.8 oz (80.7 kg)  06/04/19 177 lb 12.8 oz (80.6 kg)   BP Readings from Last 3 Encounters:  06/26/19 120/70  06/10/19 109/61  06/04/19 118/70   Guideline developer:  UpToDate (see UpToDate for funding source) Date Released: June 2014  Health Maintenance Due  Topic Date Due  . Hepatitis C Screening  10/05/1945  . URINE MICROALBUMIN  08/04/1954  . TETANUS/TDAP  08/05/1963  . COLONOSCOPY  08/04/1994  . PNA vac Low Risk Adult (2 of 2 - PCV13) 09/16/2018    There are no preventive care reminders to display for this patient.  Lab Results  Component Value Date   TSH 3.204 06/04/2018   Lab Results  Component Value Date   WBC 11.4 (H) 06/09/2019   HGB 13.0 06/09/2019   HCT 39.3 06/09/2019   MCV 87.1 06/09/2019   PLT 167 06/09/2019   Lab Results  Component Value Date   NA 128 (L) 06/09/2019   K 3.6 06/09/2019   CO2 19 (L) 06/09/2019   GLUCOSE 196 (H) 06/09/2019   BUN 19 06/09/2019   CREATININE 1.74 (H) 06/09/2019   BILITOT 1.3 (H) 06/09/2019   ALKPHOS 95 06/09/2019   AST 15 06/09/2019   ALT 15 06/09/2019   PROT 6.8 06/09/2019   ALBUMIN 3.4 (L)  06/09/2019   CALCIUM 8.6 (L) 06/09/2019   ANIONGAP 11 06/09/2019   GFR 40.37 (L) 01/23/2019   Lab Results  Component Value Date   CHOL 155 06/07/2018   Lab Results  Component Value Date   HDL 31 (L) 06/07/2018   Lab Results  Component Value Date   LDLCALC 93 06/07/2018   Lab Results  Component Value Date   TRIG 154 (H) 06/07/2018   Lab Results  Component Value Date   CHOLHDL 5.0 06/07/2018   Lab Results  Component Value Date   HGBA1C 6.1 (H) 06/06/2018      Assessment & Plan:   Problem List Items Addressed This Visit      Nervous and Auditory   Cervical myelopathy (HCC) - Primary     Musculoskeletal and Integument   Acute strain of neck muscle   Relevant Medications   methocarbamol (ROBAXIN) 500 MG tablet   traMADol (ULTRAM) 50 MG tablet    Other Visit Diagnoses    Need for influenza vaccination       Relevant Orders   Flu Vaccine QUAD High Dose(Fluad) (Completed)      Meds ordered this encounter  Medications  . methocarbamol (ROBAXIN) 500 MG tablet    Sig: Take 1 tablet (500 mg total) by mouth every 8 (eight) hours as needed for muscle spasms.    Dispense:  30 tablet    Refill:  0  . traMADol (ULTRAM) 50 MG tablet    Sig: Take 1 tablet (50 mg total) by mouth every 6 (six) hours as needed for up to 7 days.    Dispense:  28 tablet    Refill:  0    Follow-up: Return in about 2 weeks (around  07/10/2019), or if symptoms worsen or fail to improve.

## 2019-07-08 ENCOUNTER — Telehealth: Payer: Self-pay

## 2019-07-08 NOTE — Telephone Encounter (Signed)
Copied from [place] [phone]. Topic: General - Other >> Jul 08, 2019  4:13 PM Leward Quan A wrote: Reason for CRM: Patient called to inform Dr Ethelene Hal that he is still feeling the same after taking medication prescribed asking for something else to be done please and a call back at Ph# [phone]

## 2019-07-09 NOTE — Telephone Encounter (Signed)
Patient is due for fu tomorrow.

## 2019-07-09 NOTE — Telephone Encounter (Signed)
Pt scheduled for 3:30 tomorrow.

## 2019-07-10 ENCOUNTER — Ambulatory Visit (INDEPENDENT_AMBULATORY_CARE_PROVIDER_SITE_OTHER): Payer: Medicare Other | Admitting: Family Medicine

## 2019-07-10 ENCOUNTER — Encounter: Payer: Self-pay | Admitting: Family Medicine

## 2019-07-10 ENCOUNTER — Other Ambulatory Visit: Payer: Self-pay

## 2019-07-10 VITALS — BP 130/70 | HR 70 | Ht 67.0 in

## 2019-07-10 DIAGNOSIS — K134 Granuloma and granuloma-like lesions of oral mucosa: Secondary | ICD-10-CM

## 2019-07-10 DIAGNOSIS — T887XXA Unspecified adverse effect of drug or medicament, initial encounter: Secondary | ICD-10-CM | POA: Diagnosis not present

## 2019-07-10 DIAGNOSIS — G959 Disease of spinal cord, unspecified: Secondary | ICD-10-CM | POA: Diagnosis not present

## 2019-07-10 DIAGNOSIS — M4712 Other spondylosis with myelopathy, cervical region: Secondary | ICD-10-CM

## 2019-07-10 MED ORDER — HYDROCODONE-ACETAMINOPHEN 5-325 MG PO TABS: 1.0000 | ORAL_TABLET | Freq: Four times a day (QID) | ORAL | 0 refills | Status: DC | PRN

## 2019-07-10 MED ORDER — POLYETHYLENE GLYCOL 3350 17 GM/SCOOP PO POWD: 17.0000 g | Freq: Two times a day (BID) | ORAL | 1 refills | Status: DC | PRN

## 2019-07-10 MED ORDER — METHOCARBAMOL 500 MG PO TABS: 500.0000 mg | ORAL_TABLET | Freq: Three times a day (TID) | ORAL | 0 refills | Status: DC | PRN

## 2019-07-10 NOTE — Progress Notes (Signed)
Established Patient Office Visit  Subjective:  Patient ID: Ian Ramos, male    DOB: [birth year]  Age: 73 y.o. MRN: [id number]  CC:  Chief Complaint  Patient presents with  . Follow-up    neck pain    HPI Ian Ramos presents for follow-up of his chronic neck pain and stiffness sp fusion in August.   Says that the combination of tramadol and Robaxin have not been helpful.  He has not been able to see the surgeon or excision of the lesion on his right upper back.  It is not a cancer as he understands it.  Past Medical History:  Diagnosis Date  . Arthritis   . MI (myocardial infarction) (HCC)   . Neuromuscular disorder (HCC)   . Stroke [facility])     Past Surgical History:  Procedure Laterality Date  . CAROTID PTA/STENT INTERVENTION N/A 06/27/2018   Procedure: CAROTID PTA/STENT INTERVENTION;  Surgeon: Sherren Kerns, MD;  Location: [facility] INVASIVE CV LAB;  Service: Cardiovascular;  Laterality: N/A;  . LEFT HEART CATH AND CORONARY ANGIOGRAPHY N/A 06/05/2018   Procedure: LEFT HEART CATH AND CORONARY ANGIOGRAPHY;  Surgeon: Swaziland, Peter M, MD;  Location: [facility] INVASIVE CV LAB;  Service: Cardiovascular;  Laterality: N/A;  . POSTERIOR CERVICAL FUSION/FORAMINOTOMY N/A 05/23/2018   Procedure: Posterior Cervical Fusion with lateral mass fixation - C1 - C6 with laminectomy;  Surgeon: Julio Sicks, MD;  Location: [facility] OR;  Service: Neurosurgery;  Laterality: N/A;    Family History  Problem Relation Age of Onset  . Hypertension Other   . Heart failure Mother   . CAD Father        s/p CABG in his 55s  . Heart failure Father     Social History   Socioeconomic History  . Marital status: Divorced    Spouse name: Not on file  . Number of children: Not on file  . Years of education: Not on file  . Highest education level: Not on file  Occupational History  . Not on file  Social Needs  . Financial resource strain: Not on file  . Food insecurity    Worry: Not on file    Inability:  Not on file  . Transportation needs    Medical: Not on file    Non-medical: Not on file  Tobacco Use  . Smoking status: Never Smoker  . Smokeless tobacco: Current User    Types: Chew  Substance and Sexual Activity  . Alcohol use: No    Frequency: Never  . Drug use: No  . Sexual activity: Not on file  Lifestyle  . Physical activity    Days per week: Patient refused    Minutes per session: Patient refused  . Stress: Patient refused  Relationships  . Social Musician on phone: Not on file    Gets together: Not on file    Attends religious service: Not on file    Active member of club or organization: Not on file    Attends meetings of clubs or organizations: Not on file    Relationship status: Not on file  . Intimate partner violence    Fear of current or ex partner: Not on file    Emotionally abused: Not on file    Physically abused: Not on file    Forced sexual activity: Not on file  Other Topics Concern  . Not on file  Social History Narrative   NA    Outpatient Medications Prior  to Visit  Medication Sig Dispense Refill  . acetaminophen (TYLENOL) 325 MG tablet Take 2 tablets (650 mg total) by mouth every 4 (four) hours as needed for headache or mild pain.    Marland Kitchen aspirin EC 81 MG tablet Take 81 mg by mouth daily.    Marland Kitchen atorvastatin (LIPITOR) 80 MG tablet Take 80 mg by mouth daily.    . B Complex Vitamins (B COMPLEX-B12) TABS Take one tablet daily 90 tablet 1  . carvedilol (COREG) 3.125 MG tablet TAKE 1 TABLET(3.125 MG) BY MOUTH TWICE DAILY WITH A MEAL (Patient taking differently: Take 3.125 mg by mouth 2 (two) times daily with a meal. ) 180 tablet 1  . clopidogrel (PLAVIX) 75 MG tablet Take 75 mg by mouth daily.    . FEROSUL 325 (65 Fe) MG tablet TAKE 1 TABLET(325 MG) BY MOUTH DAILY WITH BREAKFAST (Patient taking differently: Take 325 mg by mouth daily with breakfast. ) 90 tablet 1  . pantoprazole (PROTONIX) 40 MG tablet Take 1 tablet (40 mg total) by mouth daily.  30 tablet 3  . aspirin 81 MG chewable tablet Place 2 tablets (162 mg total) into feeding tube daily. (Patient taking differently: Chew 81 mg by mouth 2 (two) times daily. )    . atorvastatin (LIPITOR) 80 MG tablet PLACE 1 TABLET INTO FEEDING TUBE AT 6 PM (Patient taking differently: Take 80 mg by mouth daily at 6 PM. ) 90 tablet 1  . clopidogrel (PLAVIX) 75 MG tablet PLACE 1 TABLET INTO FEEDING TUBE DAILY (Patient taking differently: Take 75 mg by mouth daily. ) 90 tablet 1  . methocarbamol (ROBAXIN) 500 MG tablet Take 1 tablet (500 mg total) by mouth every 8 (eight) hours as needed for muscle spasms. 30 tablet 0  . senna (SENOKOT) 8.6 MG TABS tablet Take 2 tablets (17.2 mg total) by mouth at bedtime as needed for mild constipation. 120 each 0   Facility-Administered Medications Prior to Visit  Medication Dose Route Frequency Provider Last Rate Last Dose  . etomidate (AMIDATE) injection    Anesthesia Intra-op Myna Bright, CRNA   12 mg at 06/09/18 1445  . succinylcholine (ANECTINE) injection    Anesthesia Intra-op Myna Bright, CRNA   100 mg at 06/09/18 1445  . aspirin chewable tablet 81 mg  81 mg Oral BID Isaiah Serge, NP        No Known Allergies  ROS Review of Systems  Constitutional: Negative for chills, diaphoresis, fever and unexpected weight change.  Respiratory: Negative.   Cardiovascular: Negative.   Gastrointestinal: Negative.   Endocrine: Negative for polyphagia and polyuria.  Genitourinary: Negative.   Musculoskeletal: Positive for neck pain and neck stiffness.  Skin: Negative for pallor and rash.  Neurological: Positive for weakness and numbness.  Hematological: Does not bruise/bleed easily.  Psychiatric/Behavioral: Negative.       Objective:    Physical Exam  Constitutional: He is oriented to person, place, and time. He appears well-developed and well-nourished. No distress.  HENT:  Head: Normocephalic and atraumatic.  Right Ear: External ear normal.   Left Ear: External ear normal.  Eyes: Right eye exhibits no discharge. Left eye exhibits no discharge. No scleral icterus.  Neck: No JVD present. No tracheal deviation present.  Cardiovascular: Normal rate, regular rhythm and normal heart sounds.  Pulmonary/Chest: Effort normal and breath sounds normal. No stridor.  Neurological: He is alert and oriented to person, place, and time.  Skin: Skin is warm and dry. He is not diaphoretic.  Psychiatric: He has a normal mood and affect. His behavior is normal.    BP 130/70   Pulse 70   Ht 5\' 7"  (1.702 m)   SpO2 98%   BMI 27.72 kg/m  Wt Readings from Last 3 Encounters:  06/26/19 177 lb (80.3 kg)  06/09/19 177 lb 12.8 oz (80.7 kg)  06/04/19 177 lb 12.8 oz (80.6 kg)   BP Readings from Last 3 Encounters:  07/10/19 130/70  06/26/19 120/70  06/10/19 109/61   Guideline developer:  UpToDate (see UpToDate for funding source) Date Released: June 2014  Health Maintenance Due  Topic Date Due  . Hepatitis C Screening  05-26-1946  . URINE MICROALBUMIN  08/04/1954  . TETANUS/TDAP  08/05/1963  . COLONOSCOPY  08/04/1994  . PNA vac Low Risk Adult (2 of 2 - PCV13) 09/16/2018    There are no preventive care reminders to display for this patient.  Lab Results  Component Value Date   TSH 3.204 06/04/2018   Lab Results  Component Value Date   WBC 11.4 (H) 06/09/2019   HGB 13.0 06/09/2019   HCT 39.3 06/09/2019   MCV 87.1 06/09/2019   PLT 167 06/09/2019   Lab Results  Component Value Date   NA 128 (L) 06/09/2019   K 3.6 06/09/2019   CO2 19 (L) 06/09/2019   GLUCOSE 196 (H) 06/09/2019   BUN 19 06/09/2019   CREATININE 1.74 (H) 06/09/2019   BILITOT 1.3 (H) 06/09/2019   ALKPHOS 95 06/09/2019   AST 15 06/09/2019   ALT 15 06/09/2019   PROT 6.8 06/09/2019   ALBUMIN 3.4 (L) 06/09/2019   CALCIUM 8.6 (L) 06/09/2019   ANIONGAP 11 06/09/2019   GFR 40.37 (L) 01/23/2019   Lab Results  Component Value Date   CHOL 155 06/07/2018   Lab  Results  Component Value Date   HDL 31 (L) 06/07/2018   Lab Results  Component Value Date   LDLCALC 93 06/07/2018   Lab Results  Component Value Date   TRIG 154 (H) 06/07/2018   Lab Results  Component Value Date   CHOLHDL 5.0 06/07/2018   Lab Results  Component Value Date   HGBA1C 6.1 (H) 06/06/2018      Assessment & Plan:   Problem List Items Addressed This Visit      Nervous and Auditory   Cervical myelopathy (HCC) - Primary   Relevant Medications   HYDROcodone-acetaminophen (NORCO) 5-325 MG tablet   methocarbamol (ROBAXIN) 500 MG tablet   Spondylosis, cervical, with myelopathy   Relevant Medications   HYDROcodone-acetaminophen (NORCO) 5-325 MG tablet   methocarbamol (ROBAXIN) 500 MG tablet    Other Visit Diagnoses    Medication side effect       Relevant Medications   polyethylene glycol powder (GLYCOLAX/MIRALAX) 17 GM/SCOOP powder   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 HYDROcodone-acetaminophen (NORCO) 5-325 MG tablet    Sig: Take 1 tablet by mouth every 6 (six) hours as needed for moderate pain.    Dispense:  60 tablet    Refill:  0  . polyethylene glycol powder (GLYCOLAX/MIRALAX) 17 GM/SCOOP powder    Sig: Take 17 g by mouth 2 (two) times daily as needed.    Dispense:  3350 g    Refill:  1  . methocarbamol (ROBAXIN) 500 MG tablet    Sig: Take 1 tablet (500 mg total) by mouth every 8 (eight) hours as  needed for muscle spasms.    Dispense:  30 tablet    Refill:  0    Follow-up: Return in about 1 month (around 08/10/2019), or use the miralax instead of the senokot for constipation.. Will follow up on iron and glucose levels next visit.

## 2019-08-06 ENCOUNTER — Ambulatory Visit (HOSPITAL_COMMUNITY): Payer: Medicare Other | Attending: Cardiology

## 2019-08-06 ENCOUNTER — Other Ambulatory Visit: Payer: Self-pay

## 2019-08-06 DIAGNOSIS — I428 Other cardiomyopathies: Secondary | ICD-10-CM | POA: Diagnosis not present

## 2019-08-10 ENCOUNTER — Ambulatory Visit: Payer: Medicare Other | Admitting: Family Medicine

## 2019-08-20 ENCOUNTER — Other Ambulatory Visit: Payer: Self-pay

## 2019-08-20 DIAGNOSIS — K219 Gastro-esophageal reflux disease without esophagitis: Secondary | ICD-10-CM

## 2019-08-20 MED ORDER — PANTOPRAZOLE SODIUM 40 MG PO TBEC: 40.0000 mg | DELAYED_RELEASE_TABLET | Freq: Every day | ORAL | 2 refills | Status: DC

## 2019-08-20 MED ORDER — ATORVASTATIN CALCIUM 80 MG PO TABS: 80.0000 mg | ORAL_TABLET | Freq: Every day | ORAL | 2 refills | Status: DC

## 2019-09-04 ENCOUNTER — Ambulatory Visit: Payer: Medicare Other | Admitting: Cardiology

## 2019-11-18 ENCOUNTER — Other Ambulatory Visit: Payer: Self-pay | Admitting: Family Medicine

## 2019-11-22 IMAGING — DX DG CHEST 2V
2 series · 2 of 2 positions shown · non-contrast
Comparison: PA and lateral chest 05/26/2018.

CLINICAL DATA: Chest pain and weakness for a few days. The patient
underwent cervical fusion 05/23/2018.

EXAM:
CHEST - 2 VIEW

[x chest ap]
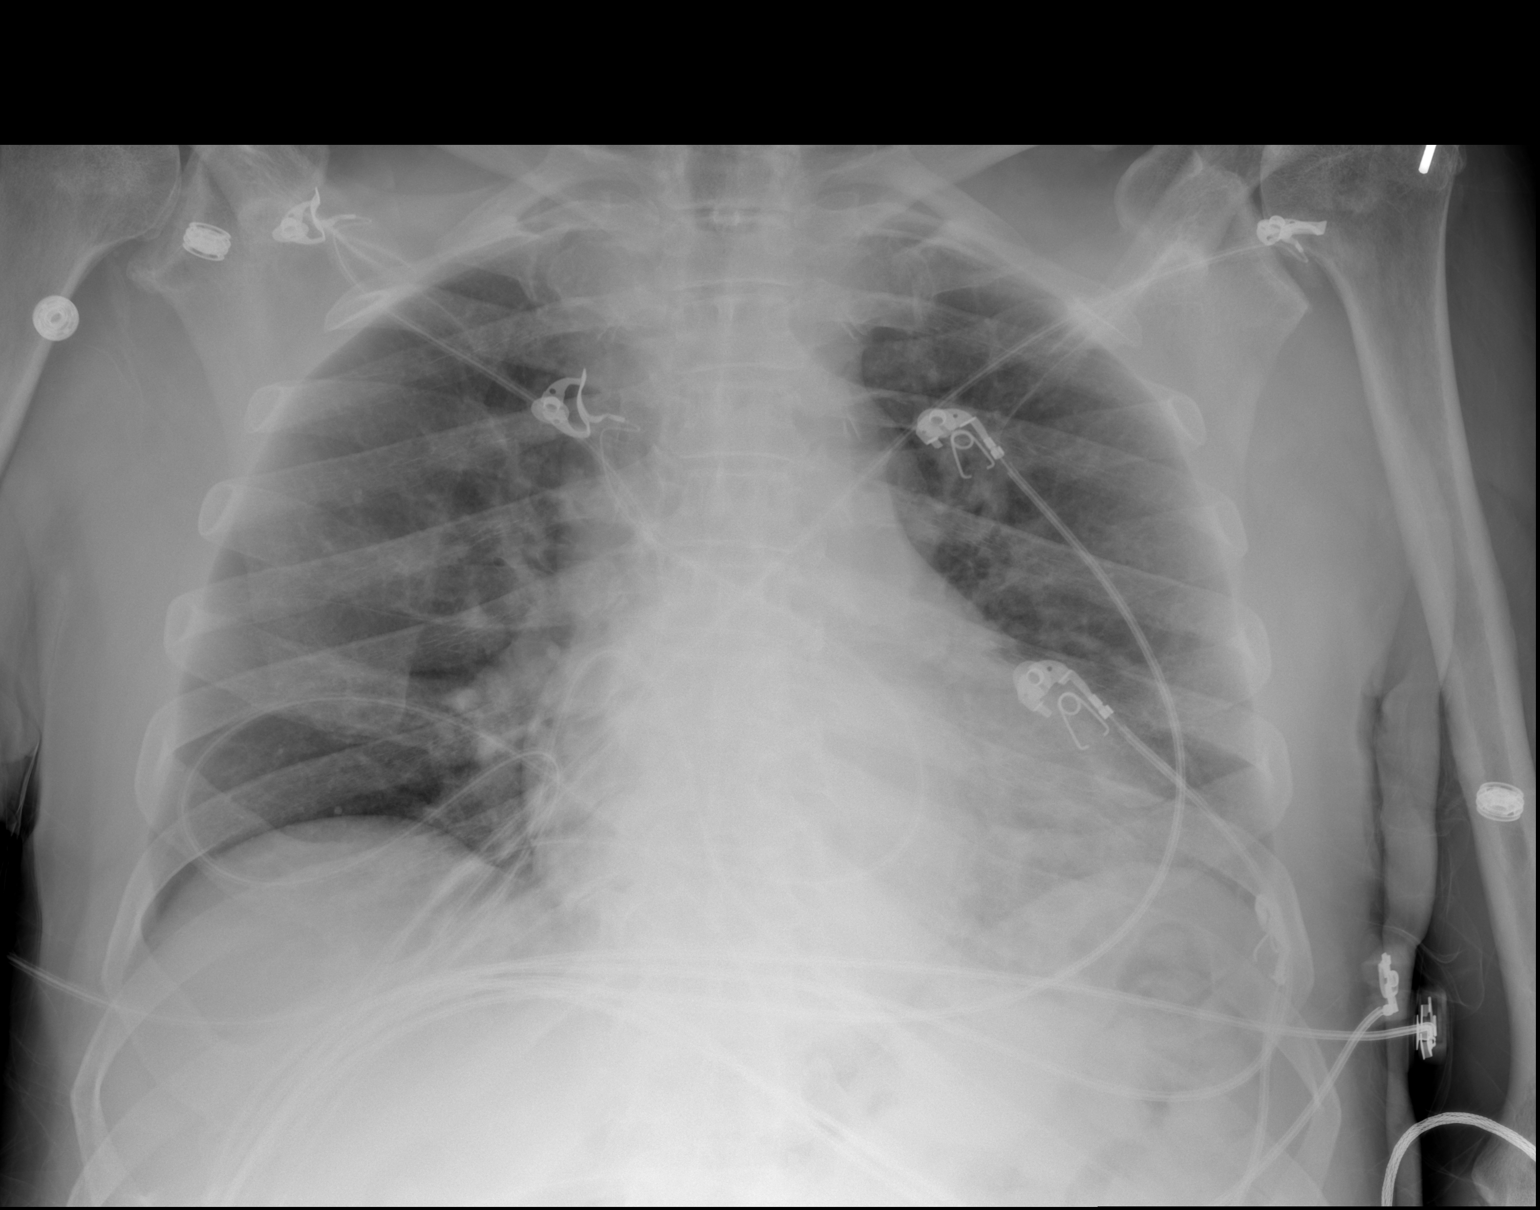

[w chest lat]
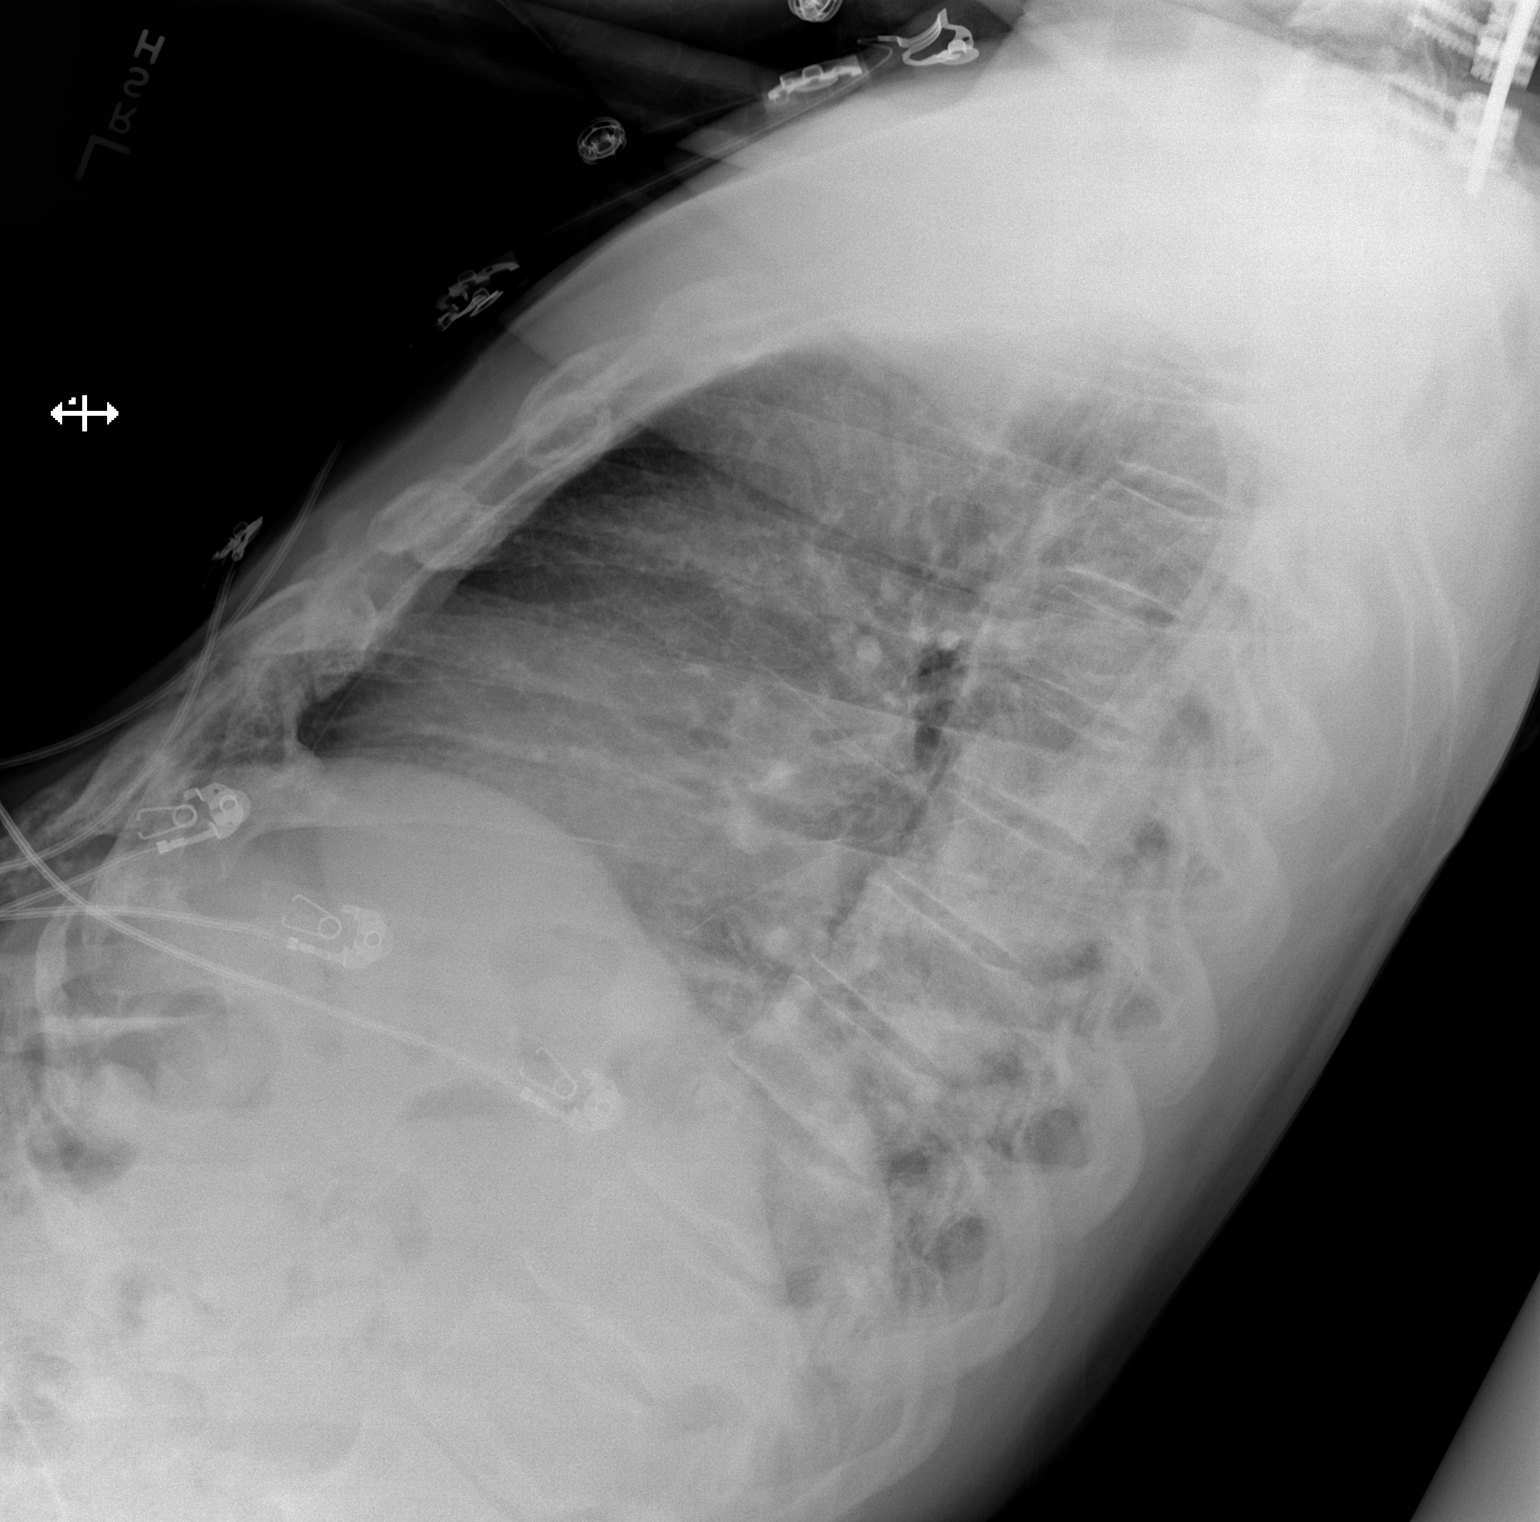

[2 of 2 positions shown; findings below may reference images not displayed]

FINDINGS: Lung volumes are low with minimal atelectasis in the left base. No
pneumothorax or pleural effusion. Heart size is normal. No acute or
focal bony abnormality. Degenerative disease about the shoulders
noted.
IMPRESSION: No acute disease.

## 2019-11-23 ENCOUNTER — Other Ambulatory Visit: Payer: Self-pay | Admitting: Family Medicine

## 2019-11-23 DIAGNOSIS — I2583 Coronary atherosclerosis due to lipid rich plaque: Secondary | ICD-10-CM

## 2019-11-23 DIAGNOSIS — I10 Essential (primary) hypertension: Secondary | ICD-10-CM

## 2019-11-23 DIAGNOSIS — I2510 Atherosclerotic heart disease of native coronary artery without angina pectoris: Secondary | ICD-10-CM

## 2019-11-23 MED ORDER — CARVEDILOL 3.125 MG PO TABS: ORAL_TABLET | ORAL | 0 refills | Status: DC

## 2019-11-23 NOTE — Telephone Encounter (Signed)
Patient wife is calling and requesting a refill for carvedilol sent to [facility] on [address]. CB is [phone]

## 2019-11-23 NOTE — Telephone Encounter (Signed)
Refill request for pending medication. Last office visit October 2020. Please advise.

## 2019-11-24 IMAGING — RF DG ESOPHAGUS
8 series · 14 of 23 positions shown · non-contrast
Comparison: CTA chest dated 05/28/2018

CLINICAL DATA: Esophageal wall thickening on CT

EXAM:
ESOPHOGRAM/BARIUM SWALLOW
TECHNIQUE: Single contrast examination was performed using  thin barium.
FLUOROSCOPY TIME:  Fluoroscopy Time:  42 seconds
Radiation Exposure Index (if provided by the fluoroscopic device):
12.5 mGy
Number of Acquired Spot Images: 8

[Series 1: cp_standard · 0.54mm/px · 2 of 43 frames shown (1 of 4)]
[frame 7/43]
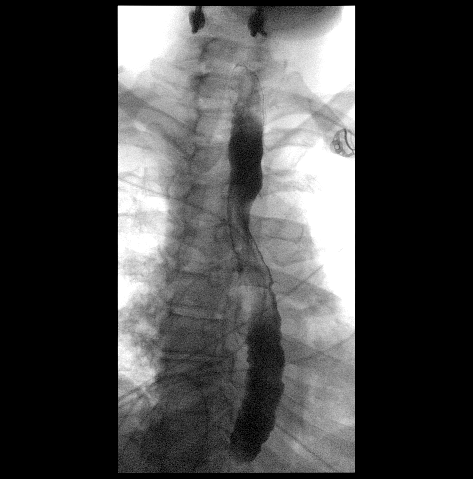
[frame 37/43]
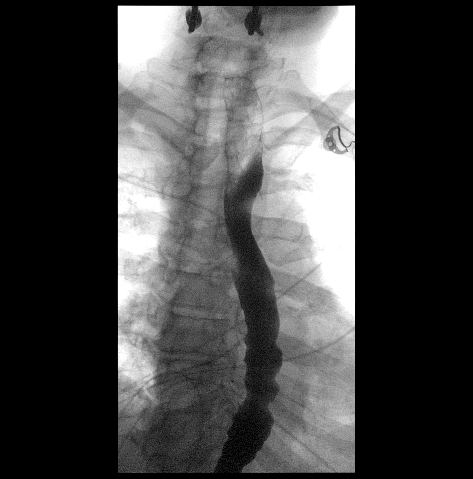

[Series 2: cp_standard · 0.54mm/px · 3 of 46 frames shown (2 of 4)]
[frame 7/46]
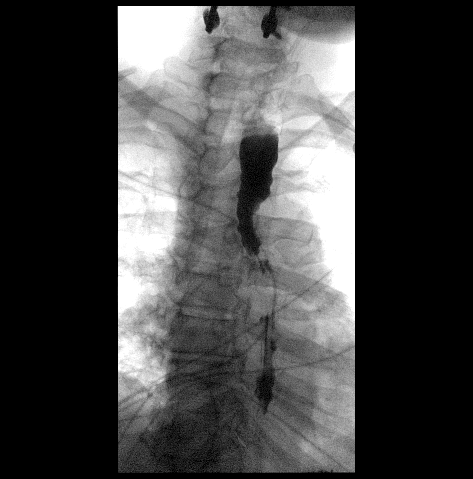
[frame 11/46]
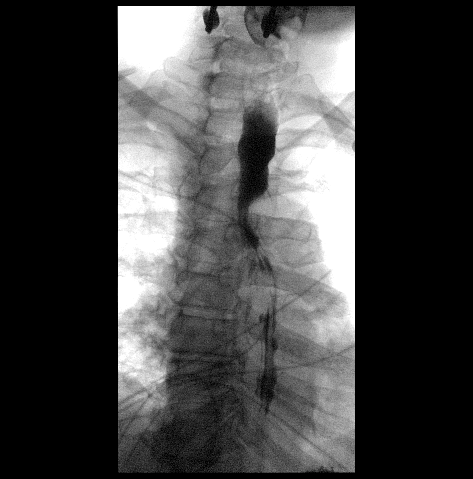
[frame 40/46]
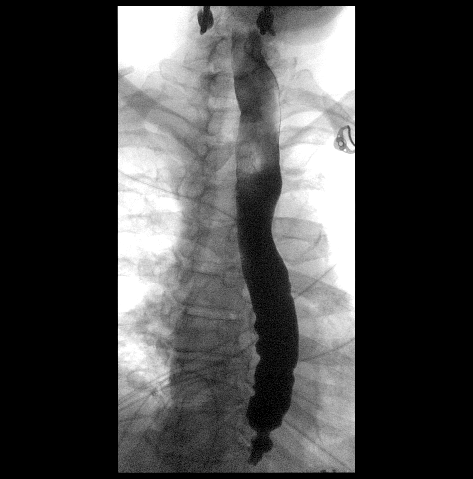

[Series 3: fluoro_barium 2fps_bw · 0.18mm/px · 1 of 2 frames shown (1 of 4)]
[frame 2/2]
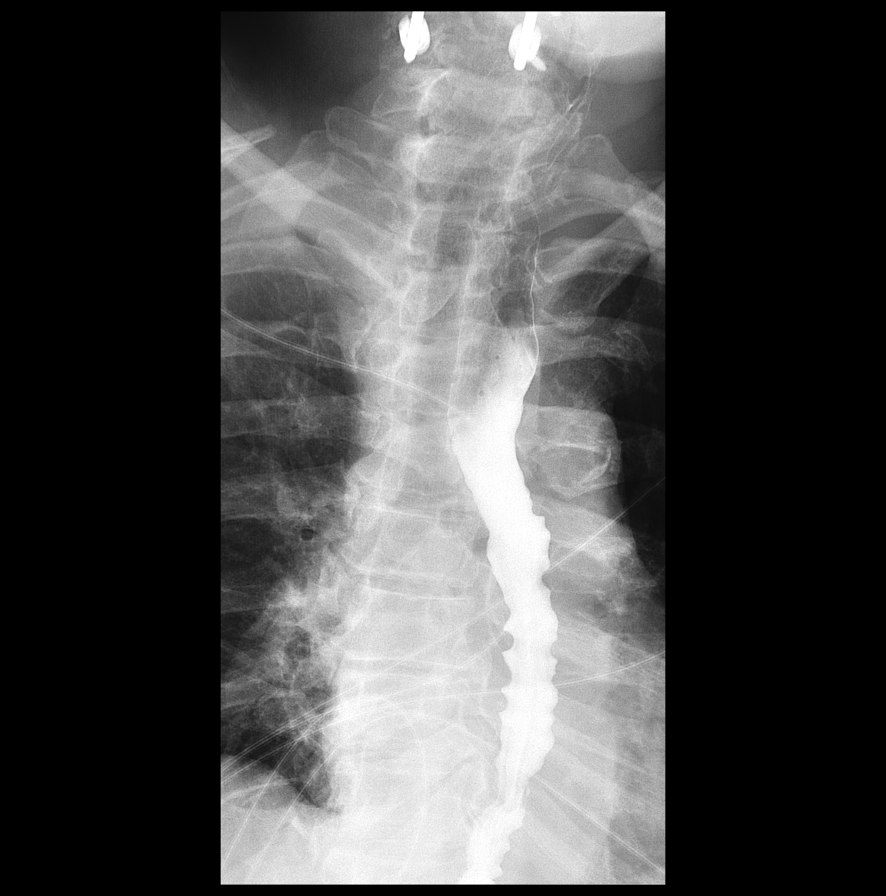

[Series 4: fluoro_barium 2fps_bw · 0.18mm/px · 1 of 1 slices shown (2 of 4)]
[im 1/1]
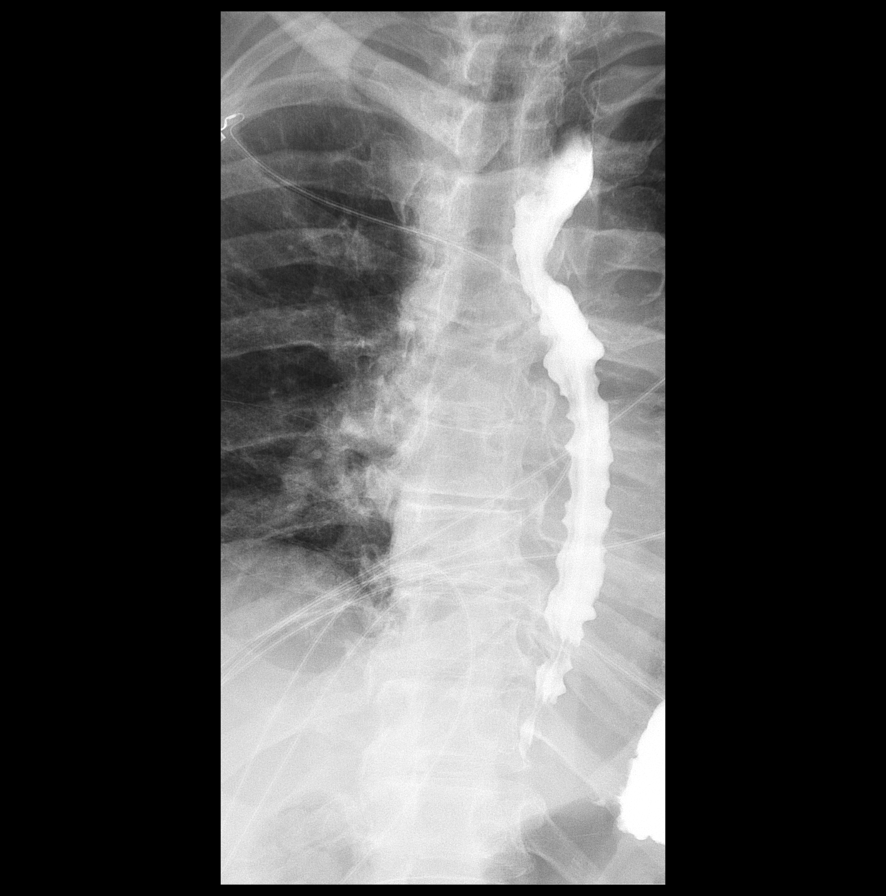

[Series 5: cp_standard · 0.54mm/px · 2 of 141 frames shown (3 of 4)]
[frame 71/141]
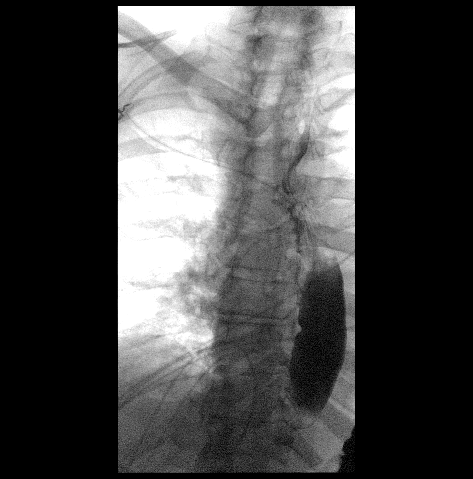
[frame 86/141]
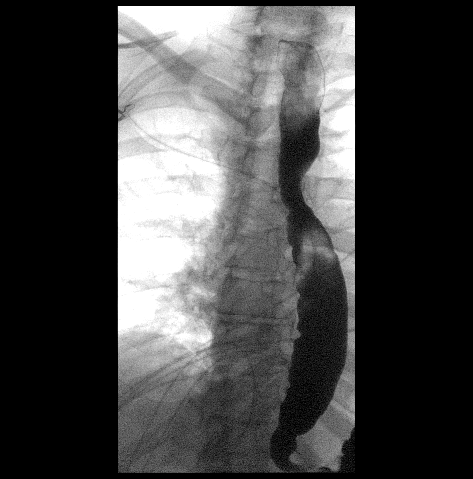

[Series 6: fluoro_barium 2fps_bw · 0.18mm/px · 1 of 2 frames shown (3 of 4)]
[frame 1/2]
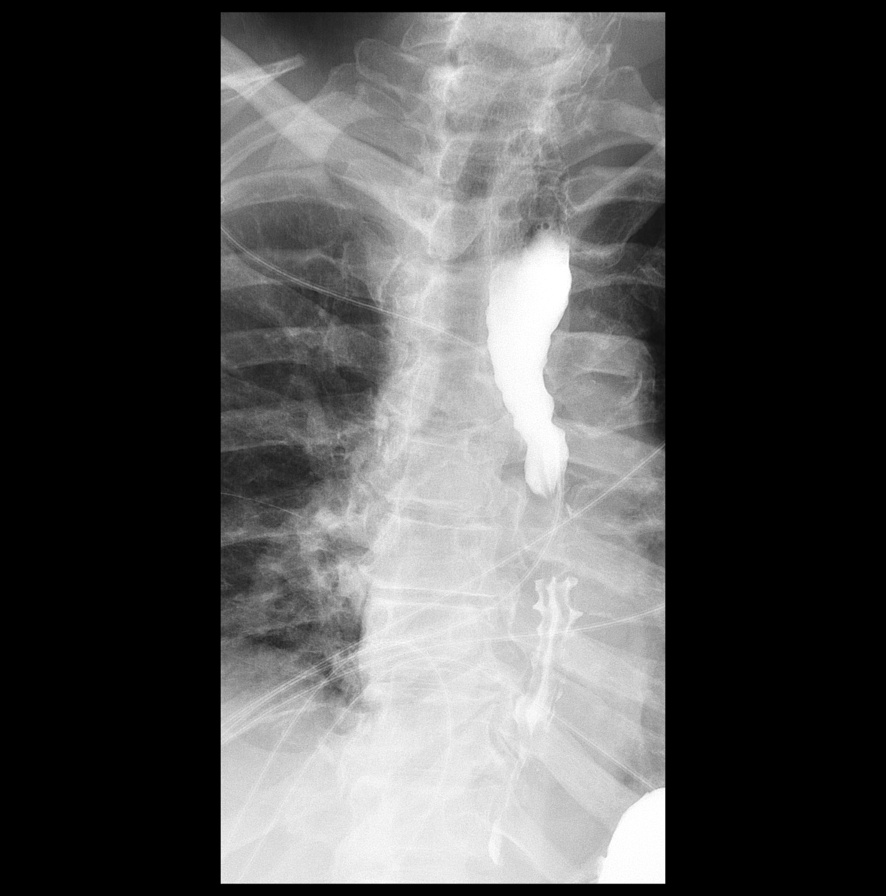

[Series 7: cp_standard · 0.54mm/px · 3 of 110 frames shown (4 of 4)]
[frame 1/110]
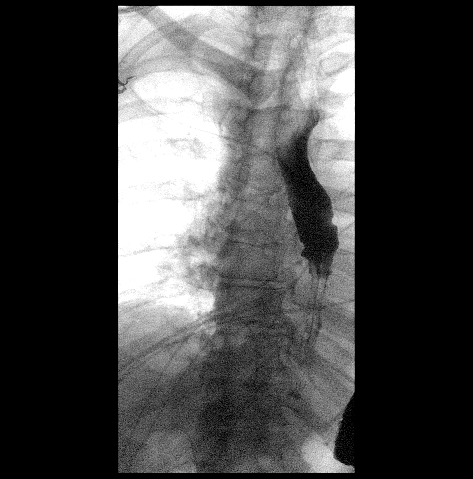
[frame 17/110]
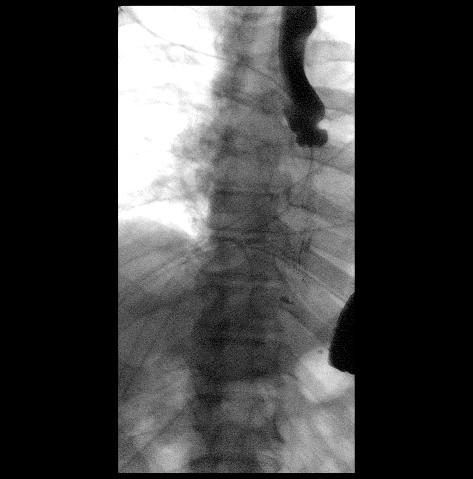
[frame 94/110]
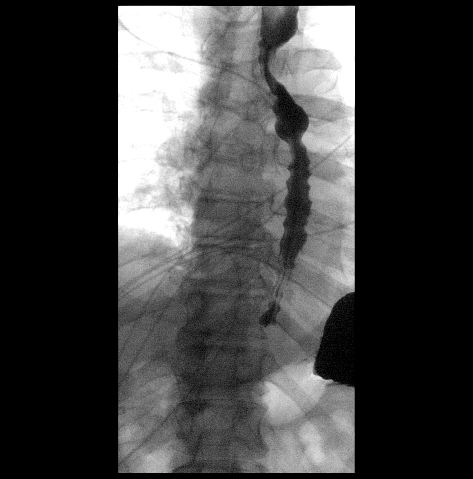

[Series 8: fluoro_barium 2fps_bw · 0.18mm/px · 1 of 2 frames shown (4 of 4)]
[frame 2/2]
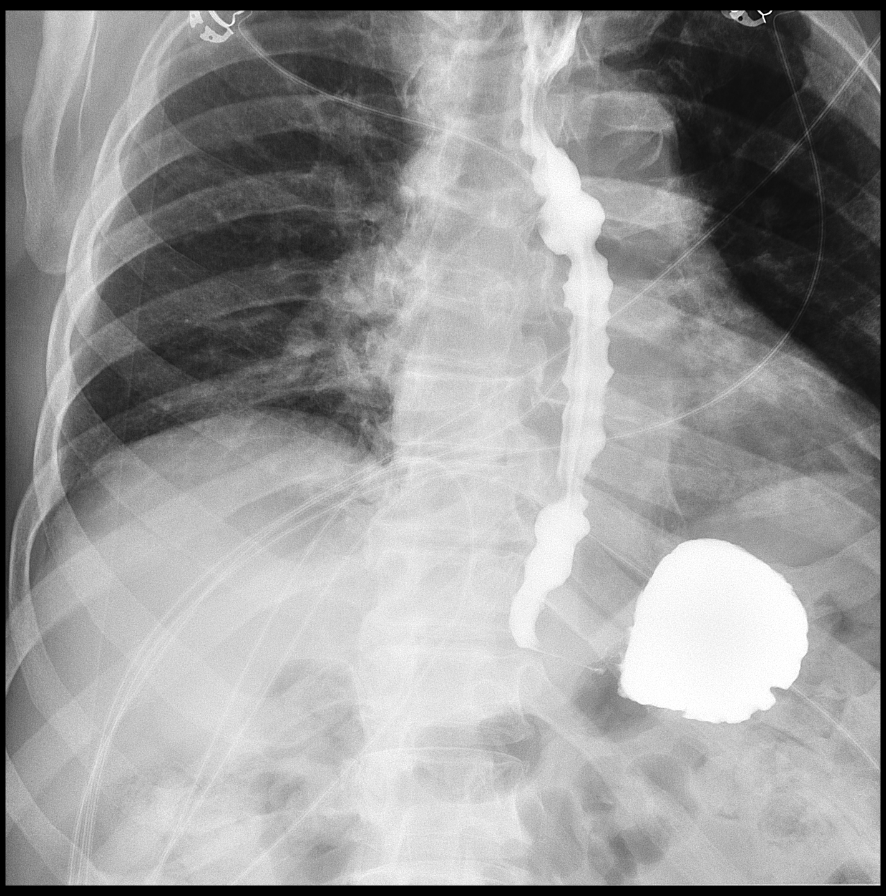

[14 of 23 positions shown; findings below may reference images not displayed]

FINDINGS: Limited evaluation due to difficulty with patient positioning and
inability to perform double-contrast evaluation. Single contrast
evaluation in the recumbent supine position was performed.

No mucosal irregularity to suggest esophageal mass. No fixed
esophageal narrowing or stricture.

Moderate esophageal dysmotility.

No evidence of hiatal hernia.

Visualized stomach is grossly unremarkable.
IMPRESSION: Moderate esophageal dysmotility.

Otherwise negative single-contrast esophagram.

## 2019-11-25 NOTE — Telephone Encounter (Signed)
Called to inform patient and wife that Rx was sent to pharmacy and to see if we could schedule follow up. No answer LMTCB

## 2019-12-08 ENCOUNTER — Encounter: Payer: Self-pay | Admitting: Family Medicine

## 2019-12-08 ENCOUNTER — Other Ambulatory Visit: Payer: Self-pay

## 2019-12-08 ENCOUNTER — Ambulatory Visit (INDEPENDENT_AMBULATORY_CARE_PROVIDER_SITE_OTHER): Payer: Medicare Other | Admitting: Family Medicine

## 2019-12-08 VITALS — BP 110/78 | HR 70 | Temp 98.5°F | Ht 67.0 in | Wt 180.2 lb

## 2019-12-08 DIAGNOSIS — E538 Deficiency of other specified B group vitamins: Secondary | ICD-10-CM | POA: Diagnosis not present

## 2019-12-08 DIAGNOSIS — R7309 Other abnormal glucose: Secondary | ICD-10-CM | POA: Diagnosis not present

## 2019-12-08 DIAGNOSIS — Z0000 Encounter for general adult medical examination without abnormal findings: Secondary | ICD-10-CM | POA: Insufficient documentation

## 2019-12-08 DIAGNOSIS — I10 Essential (primary) hypertension: Secondary | ICD-10-CM

## 2019-12-08 DIAGNOSIS — E611 Iron deficiency: Secondary | ICD-10-CM | POA: Diagnosis not present

## 2019-12-08 DIAGNOSIS — I2510 Atherosclerotic heart disease of native coronary artery without angina pectoris: Secondary | ICD-10-CM

## 2019-12-08 DIAGNOSIS — N1832 Chronic kidney disease, stage 3b: Secondary | ICD-10-CM | POA: Diagnosis not present

## 2019-12-08 DIAGNOSIS — N5201 Erectile dysfunction due to arterial insufficiency: Secondary | ICD-10-CM

## 2019-12-08 DIAGNOSIS — D649 Anemia, unspecified: Secondary | ICD-10-CM

## 2019-12-08 DIAGNOSIS — I2583 Coronary atherosclerosis due to lipid rich plaque: Secondary | ICD-10-CM | POA: Diagnosis not present

## 2019-12-08 MED ORDER — SILDENAFIL CITRATE 20 MG PO TABS: ORAL_TABLET | ORAL | 2 refills | Status: DC

## 2019-12-08 NOTE — Progress Notes (Signed)
Established Patient Office Visit  Subjective:  Patient ID: Ian Ramos, male    DOB: Dec 31, 1945  Age: 74 y.o. MRN: [id number]  CC:  Chief Complaint  Patient presents with  . Follow-up    Pt c/o being very tired and would like to discuss the blood thinner medication.  Pt would like you to look at a spot on back of rt shoulder.    HPI Ian Ramos presents for follow-up of his multiple medical issues including fatigue, erectile dysfunction, neck pain, iron and B12 deficiency.  Past Medical History:  Diagnosis Date  . Arthritis   . MI (myocardial infarction) (HCC)   . Neuromuscular disorder (HCC)   . Stroke [facility])     Past Surgical History:  Procedure Laterality Date  . CAROTID PTA/STENT INTERVENTION N/A 06/27/2018   Procedure: CAROTID PTA/STENT INTERVENTION;  Surgeon: Sherren Kerns, MD;  Location: [facility] INVASIVE CV LAB;  Service: Cardiovascular;  Laterality: N/A;  . LEFT HEART CATH AND CORONARY ANGIOGRAPHY N/A 06/05/2018   Procedure: LEFT HEART CATH AND CORONARY ANGIOGRAPHY;  Surgeon: Swaziland, Peter M, MD;  Location: [facility] INVASIVE CV LAB;  Service: Cardiovascular;  Laterality: N/A;  . POSTERIOR CERVICAL FUSION/FORAMINOTOMY N/A 05/23/2018   Procedure: Posterior Cervical Fusion with lateral mass fixation - C1 - C6 with laminectomy;  Surgeon: Julio Sicks, MD;  Location: [facility] OR;  Service: Neurosurgery;  Laterality: N/A;    Family History  Problem Relation Age of Onset  . Hypertension Other   . Heart failure Mother   . CAD Father        s/p CABG in his 87s  . Heart failure Father     Social History   Socioeconomic History  . Marital status: Divorced    Spouse name: Not on file  . Number of children: Not on file  . Years of education: Not on file  . Highest education level: Not on file  Occupational History  . Not on file  Tobacco Use  . Smoking status: Never Smoker  . Smokeless tobacco: Current User    Types: Chew  Substance and Sexual Activity  . Alcohol use:  No  . Drug use: No  . Sexual activity: Not on file  Other Topics Concern  . Not on file  Social History Narrative   NA   Social Determinants of Health   Financial Resource Strain:   . Difficulty of Paying Living Expenses: Not on file  Food Insecurity:   . Worried About Programme researcher, broadcasting/film/video in the Last Year: Not on file  . Ran Out of Food in the Last Year: Not on file  Transportation Needs:   . Lack of Transportation (Medical): Not on file  . Lack of Transportation (Non-Medical): Not on file  Physical Activity:   . Days of Exercise per Week: Not on file  . Minutes of Exercise per Session: Not on file  Stress:   . Feeling of Stress : Not on file  Social Connections:   . Frequency of Communication with Friends and Family: Not on file  . Frequency of Social Gatherings with Friends and Family: Not on file  . Attends Religious Services: Not on file  . Active Member of Clubs or Organizations: Not on file  . Attends Banker Meetings: Not on file  . Marital Status: Not on file  Intimate Partner Violence:   . Fear of Current or Ex-Partner: Not on file  . Emotionally Abused: Not on file  . Physically Abused: Not  on file  . Sexually Abused: Not on file    Outpatient Medications Prior to Visit  Medication Sig Dispense Refill  . acetaminophen (TYLENOL) 325 MG tablet Take 2 tablets (650 mg total) by mouth every 4 (four) hours as needed for headache or mild pain.    Marland Kitchen aspirin EC 81 MG tablet Take 81 mg by mouth daily.    Marland Kitchen atorvastatin (LIPITOR) 80 MG tablet Take 1 tablet (80 mg total) by mouth daily. 90 tablet 2  . B Complex Vitamins (B COMPLEX-B12) TABS Take one tablet daily 90 tablet 1  . carvedilol (COREG) 3.125 MG tablet TAKE 1 TABLET(3.125 MG) BY MOUTH TWICE DAILY WITH A MEAL 180 tablet 0  . clopidogrel (PLAVIX) 75 MG tablet PLACE 1 TABLET INTO FEEDING TUBE DAILY 90 tablet 2  . FEROSUL 325 (65 Fe) MG tablet TAKE 1 TABLET(325 MG) BY MOUTH DAILY WITH BREAKFAST (Patient  taking differently: Take 325 mg by mouth daily with breakfast. ) 90 tablet 1  . pantoprazole (PROTONIX) 40 MG tablet Take 1 tablet (40 mg total) by mouth daily. 90 tablet 2  . polyethylene glycol powder (GLYCOLAX/MIRALAX) 17 GM/SCOOP powder Take 17 g by mouth 2 (two) times daily as needed. 3350 g 1  . HYDROcodone-acetaminophen (NORCO) 5-325 MG tablet Take 1 tablet by mouth every 6 (six) hours as needed for moderate pain. (Patient not taking: Reported on 12/08/2019) 60 tablet 0  . methocarbamol (ROBAXIN) 500 MG tablet Take 1 tablet (500 mg total) by mouth every 8 (eight) hours as needed for muscle spasms. (Patient not taking: Reported on 12/08/2019) 30 tablet 0   Facility-Administered Medications Prior to Visit  Medication Dose Route Frequency Provider Last Rate Last Admin  . etomidate (AMIDATE) injection    Anesthesia Intra-op Myna Bright, CRNA   12 mg at 06/09/18 1445  . succinylcholine (ANECTINE) injection    Anesthesia Intra-op Myna Bright, CRNA   100 mg at 06/09/18 1445    No Known Allergies  ROS Review of Systems    Objective:    Physical Exam  BP 110/78 (BP Location: Right Arm, Patient Position: Sitting, Cuff Size: Normal)   Pulse 70   Temp 98.5 F (36.9 C) (Temporal)   Ht 5\' 7"  (1.702 m)   Wt 180 lb 3.2 oz (81.7 kg)   SpO2 98%   BMI 28.22 kg/m  Wt Readings from Last 3 Encounters:  12/08/19 180 lb 3.2 oz (81.7 kg)  06/26/19 177 lb (80.3 kg)  06/09/19 177 lb 12.8 oz (80.7 kg)     Health Maintenance Due  Topic Date Due  . Hepatitis C Screening  1946-08-31  . URINE MICROALBUMIN  08/04/1954  . TETANUS/TDAP  08/05/1963  . COLONOSCOPY  08/04/1994  . PNA vac Low Risk Adult (2 of 2 - PCV13) 09/16/2018    There are no preventive care reminders to display for this patient.  Lab Results  Component Value Date   TSH 3.204 06/04/2018   Lab Results  Component Value Date   WBC 11.4 (H) 06/09/2019   HGB 13.0 06/09/2019   HCT 39.3 06/09/2019   MCV 87.1  06/09/2019   PLT 167 06/09/2019   Lab Results  Component Value Date   NA 128 (L) 06/09/2019   K 3.6 06/09/2019   CO2 19 (L) 06/09/2019   GLUCOSE 196 (H) 06/09/2019   BUN 19 06/09/2019   CREATININE 1.74 (H) 06/09/2019   BILITOT 1.3 (H) 06/09/2019   ALKPHOS 95 06/09/2019   AST 15 06/09/2019  ALT 15 06/09/2019   PROT 6.8 06/09/2019   ALBUMIN 3.4 (L) 06/09/2019   CALCIUM 8.6 (L) 06/09/2019   ANIONGAP 11 06/09/2019   GFR 40.37 (L) 01/23/2019   Lab Results  Component Value Date   CHOL 155 06/07/2018   Lab Results  Component Value Date   HDL 31 (L) 06/07/2018   Lab Results  Component Value Date   LDLCALC 93 06/07/2018   Lab Results  Component Value Date   TRIG 154 (H) 06/07/2018   Lab Results  Component Value Date   CHOLHDL 5.0 06/07/2018   Lab Results  Component Value Date   HGBA1C 6.1 (H) 06/06/2018      Assessment & Plan:   Problem List Items Addressed This Visit      Cardiovascular and Mediastinum   Coronary artery disease due to lipid rich plaque   Relevant Medications   sildenafil (REVATIO) 20 MG tablet   Other Relevant Orders   LDL cholesterol, direct   Lipid panel   Essential hypertension   Relevant Medications   sildenafil (REVATIO) 20 MG tablet   Other Relevant Orders   Comprehensive metabolic panel   Urinalysis, Routine w reflex microscopic   Microalbumin / creatinine urine ratio   Erectile dysfunction due to arterial insufficiency   Relevant Medications   sildenafil (REVATIO) 20 MG tablet     Genitourinary   CKD (chronic kidney disease) stage 3, GFR 30-59 ml/min   Relevant Orders   Comprehensive metabolic panel     Other   Anemia   Relevant Orders   CBC   TSH   B12 deficiency - Primary   Relevant Orders   Vitamin B12   Iron deficiency   Relevant Orders   Iron, TIBC and Ferritin Panel   Healthcare maintenance   Relevant Orders   PSA   Elevated glucose   Relevant Orders   Hemoglobin A1c      Meds ordered this encounter    Medications  . sildenafil (REVATIO) 20 MG tablet    Sig: May take 3-5 pills daily 45 minutes prior...    Dispense:  25 tablet    Refill:  2    Follow-up: Return in about 3 months (around 03/09/2020), or if symptoms worsen or fail to improve.    Mliss Sax, MD

## 2019-12-09 LAB — COMPREHENSIVE METABOLIC PANEL
ALT: 21 U/L (ref 0–53)
AST: 19 U/L (ref 0–37)
Albumin: 3.9 g/dL (ref 3.5–5.2)
Alkaline Phosphatase: 110 U/L (ref 39–117)
BUN: 21 mg/dL (ref 6–23)
CO2: 29 mEq/L (ref 19–32)
Calcium: 9 mg/dL (ref 8.4–10.5)
Chloride: 107 mEq/L (ref 96–112)
Creatinine, Ser: 1.58 mg/dL — ABNORMAL HIGH (ref 0.40–1.50)
GFR: 42.93 mL/min — ABNORMAL LOW (ref >60.00)
Glucose, Bld: 97 mg/dL (ref 70–99)
Potassium: 5 mEq/L (ref 3.5–5.1)
Sodium: 141 mEq/L (ref 135–145)
Total Bilirubin: 0.6 mg/dL (ref 0.2–1.2)
Total Protein: 6.5 g/dL (ref 6.0–8.3)

## 2019-12-09 LAB — MICROALBUMIN / CREATININE URINE RATIO
Creatinine,U: 117.8 mg/dL
Microalb Creat Ratio: 1.8 mg/g (ref 0.0–30.0)
Microalb, Ur: 2.2 mg/dL — ABNORMAL HIGH (ref 0.0–1.9)

## 2019-12-09 LAB — LIPID PANEL
Cholesterol: 109 mg/dL (ref 0–200)
HDL: 29 mg/dL — ABNORMAL LOW (ref >39.00)
LDL Cholesterol: 54 mg/dL (ref 0–99)
NonHDL: 80.38
Total CHOL/HDL Ratio: 4
Triglycerides: 133 mg/dL (ref 0.0–149.0)
VLDL: 26.6 mg/dL (ref 0.0–40.0)

## 2019-12-09 LAB — HEMOGLOBIN A1C: Hgb A1c MFr Bld: 6.8 % — ABNORMAL HIGH (ref 4.6–6.5)

## 2019-12-09 LAB — URINALYSIS, ROUTINE W REFLEX MICROSCOPIC
Bilirubin Urine: NEGATIVE
Hgb urine dipstick: NEGATIVE
Ketones, ur: NEGATIVE
Leukocytes,Ua: NEGATIVE
Nitrite: NEGATIVE
RBC / HPF: NONE SEEN (ref 0–?)
Specific Gravity, Urine: 1.02 (ref 1.000–1.030)
Total Protein, Urine: NEGATIVE
Urine Glucose: NEGATIVE
Urobilinogen, UA: 0.2 (ref 0.0–1.0)
pH: 6 (ref 5.0–8.0)

## 2019-12-09 LAB — IRON,TIBC AND FERRITIN PANEL
%SAT: 33 % (calc) (ref 20–48)
Ferritin: 84 ng/mL (ref 24–380)
Iron: 96 ug/dL (ref 50–180)
TIBC: 291 mcg/dL (calc) (ref 250–425)

## 2019-12-09 LAB — PSA: PSA: 0.93 ng/mL (ref 0.10–4.00)

## 2019-12-09 LAB — CBC
HCT: 40.5 % (ref 39.0–52.0)
Hemoglobin: 13.7 g/dL (ref 13.0–17.0)
MCHC: 33.7 g/dL (ref 30.0–36.0)
MCV: 89 fl (ref 78.0–100.0)
Platelets: 173 10*3/uL (ref 150.0–400.0)
RBC: 4.55 Mil/uL (ref 4.22–5.81)
RDW: 13.2 % (ref 11.5–15.5)
WBC: 8.9 10*3/uL (ref 4.0–10.5)

## 2019-12-09 LAB — LDL CHOLESTEROL, DIRECT: Direct LDL: 59 mg/dL

## 2019-12-09 LAB — VITAMIN B12: Vitamin B-12: 731 pg/mL (ref 211–911)

## 2019-12-09 LAB — TSH: TSH: 2.45 u[IU]/mL (ref 0.35–4.50)

## 2019-12-15 ENCOUNTER — Telehealth: Payer: Self-pay | Admitting: Family Medicine

## 2019-12-15 NOTE — Telephone Encounter (Signed)
Patient finance is calling and wanted to see if patient labs were back yet. CB is [phone]

## 2019-12-15 NOTE — Telephone Encounter (Signed)
Returned call, but was asked to call back with results.

## 2019-12-16 NOTE — Telephone Encounter (Signed)
Spoke with Kendal Hymen who's calling for lab results on patient. I checked with patient to make sure it was okay to give lab results to [facility]. Patient okayed this and both verbally understood labs were normal.

## 2020-01-25 ENCOUNTER — Telehealth: Payer: Self-pay | Admitting: Family Medicine

## 2020-01-25 NOTE — Progress Notes (Signed)
°  Chronic Care Management   Outreach Note  01/25/2020 Name: Ian Ramos MRN: [id number] DOB: May 25, 1946  Referred by: Mliss Sax, MD Reason for referral : No chief complaint on file.   An unsuccessful telephone outreach was attempted today. The patient was referred to the pharmacist for assistance with care management and care coordination.    This note is not being shared with the patient for the following reason: To respect privacy (The patient or proxy has requested that the information not be shared).  Follow Up Plan:   Lynnae January Upstream Scheduler

## 2020-01-29 ENCOUNTER — Telehealth: Payer: Self-pay | Admitting: Family Medicine

## 2020-01-29 NOTE — Progress Notes (Signed)
  Chronic Care Management   Note  01/29/2020 Name: Ian Ramos MRN: [id number] DOB: [birth year]  Ian Ramos is a 74 y.o. year old male who is a primary care patient of Mliss Sax, MD. I reached out to Ian Ramos by phone today in response to a referral sent by [name] Rhett's PCP, Mliss Sax, MD.   [name] was given information about Chronic Care Management services today including:  1. CCM service includes personalized support from designated clinical staff supervised by his physician, including individualized plan of care and coordination with other care providers 2. 24/7 contact phone numbers for assistance for urgent and routine care needs. 3. Service will only be billed when office clinical staff spend 20 minutes or more in a month to coordinate care. 4. Only one practitioner may furnish and bill the service in a calendar month. 5. The patient may stop CCM services at any time (effective at the end of the month) by phone call to the office staff.   Patient agreed to services and verbal consent obtained.   This note is not being shared with the patient for the following reason: To respect privacy (The patient or proxy has requested that the information not be shared).  Follow up plan:   Lynnae January Upstream Scheduler

## 2020-02-15 ENCOUNTER — Other Ambulatory Visit: Payer: Self-pay | Admitting: Family Medicine

## 2020-02-15 DIAGNOSIS — D649 Anemia, unspecified: Secondary | ICD-10-CM

## 2020-02-16 ENCOUNTER — Other Ambulatory Visit: Payer: Self-pay | Admitting: Family Medicine

## 2020-02-16 DIAGNOSIS — I10 Essential (primary) hypertension: Secondary | ICD-10-CM

## 2020-02-16 DIAGNOSIS — I2510 Atherosclerotic heart disease of native coronary artery without angina pectoris: Secondary | ICD-10-CM

## 2020-02-22 ENCOUNTER — Telehealth: Payer: Self-pay | Admitting: Family Medicine

## 2020-02-22 ENCOUNTER — Other Ambulatory Visit: Payer: Self-pay | Admitting: Family Medicine

## 2020-02-22 DIAGNOSIS — I10 Essential (primary) hypertension: Secondary | ICD-10-CM

## 2020-02-22 DIAGNOSIS — I2510 Atherosclerotic heart disease of native coronary artery without angina pectoris: Secondary | ICD-10-CM

## 2020-02-22 NOTE — Telephone Encounter (Signed)
Error

## 2020-02-22 NOTE — Telephone Encounter (Signed)
Spoke with patient who verbally understood he does not have continue with Protonix but he will need to continue coreg patient agrees to recommendations and will follow.

## 2020-02-22 NOTE — Telephone Encounter (Signed)
Patient is calling and wanted to see if he was suppose to continue taking protonix. Pls advise. [phone]

## 2020-03-09 NOTE — Chronic Care Management (AMB) (Signed)
Chronic Care Management Pharmacy  Name: JAMEER STORIE  MRN: [id number] DOB: 11/20/1945  Chief Complaint/ HPI  Dimple Nanas,  74 y.o. , male presents for their Initial CCM visit with the clinical pharmacist In office.  PCP : Libby Maw, MD  Their chronic conditions include: Heart failure, CAD, HTN, chronic kidney disease, Diabetes   Office Visits: 12/08/19: Patient presented to Dr. Ethelene Hal for follow-up. Patient with complaints of fatigue,  Erectile dysfunction and neck pain. Patient started on sildenafil.    Consult Visit:None in past 6 months  Medications: Outpatient Encounter Medications as of 03/10/2020  Medication Sig  . aspirin EC 81 MG tablet Take 81 mg by mouth daily.  Marland Kitchen atorvastatin (LIPITOR) 80 MG tablet Take 1 tablet (80 mg total) by mouth daily.  . B Complex Vitamins (B COMPLEX-B12) TABS Take one tablet daily  . carvedilol (COREG) 3.125 MG tablet TAKE 1 TABLET(3.125 MG) BY MOUTH TWICE DAILY WITH A MEAL  . clopidogrel (PLAVIX) 75 MG tablet PLACE 1 TABLET INTO FEEDING TUBE DAILY  . pantoprazole (PROTONIX) 40 MG tablet Take 1 tablet (40 mg total) by mouth daily.  Marland Kitchen acetaminophen (TYLENOL) 325 MG tablet Take 2 tablets (650 mg total) by mouth every 4 (four) hours as needed for headache or mild pain. (Patient not taking: Reported on 03/10/2020)  . DULoxetine (CYMBALTA) 30 MG capsule Take 1 capsule (30 mg total) by mouth 2 (two) times daily.  . FEROSUL 325 (65 Fe) MG tablet TAKE 1 TABLET(325 MG) BY MOUTH DAILY WITH BREAKFAST  . HYDROcodone-acetaminophen (NORCO) 5-325 MG tablet Take 1 tablet by mouth every 6 (six) hours as needed for moderate pain.  . methocarbamol (ROBAXIN) 500 MG tablet Take 1 tablet (500 mg total) by mouth every 8 (eight) hours as needed for muscle spasms.  . sildenafil (REVATIO) 20 MG tablet May take 3-5 pills daily 45 minutes prior...  . [DISCONTINUED] polyethylene glycol powder (GLYCOLAX/MIRALAX) 17 GM/SCOOP powder Take 17 g by mouth 2  (two) times daily as needed.   Facility-Administered Encounter Medications as of 03/10/2020  Medication  . etomidate (AMIDATE) injection  . succinylcholine (ANECTINE) injection     Current Diagnosis/Assessment:  Goals Addressed            This Visit's Progress   . Chronic Care Management       CARE PLAN ENTRY  Current Barriers:  . Chronic Disease Management support, education, and care coordination needs related to Hypertension, Hyperlipidemia, Heart Failure, Coronary Artery Disease, GERD, Chronic Kidney Disease, and Chronic pain   Hypertension . Pharmacist Clinical Goal(s): o Over the next 90 days, patient will work with PharmD and providers to maintain BP goal <140/90 . Current regimen:  o Carvedilol 3.125 mg BID . Interventions: o Recommend purchasing blood pressure monitor at [employer] . Patient self care activities - Over the next 90 days, patient will: o Check blood pressure weekly, document, and provide at future appointments o Ensure daily salt intake < 2300 mg/day  Heart Failure . Pharmacist Clinical Goal(s): o Over the next 90 days, patient will work with PharmD and providers to maintain BP goal <140/90 . Current regimen:  o Carvedilol 3.125 mg BID . Patient self care activities - Over the next 90 days, patient will: o Check weight daily, document, and provide at future appointments o Limit salt intake to less than 2300 mg/day  Hyperlipidemia . Pharmacist Clinical Goal(s): o Over the next 90 days, patient will work with PharmD and providers to maintain LDL goal <  70 . Current regimen:  o Atorvastatin 80 mg  Medication management . Pharmacist Clinical Goal(s): o Over the next 90 days, patient will work with PharmD and providers to maintain optimal medication adherence . Current pharmacy: [facility] . Interventions o Comprehensive medication review performed. o Continue current medication management strategy . Patient self care activities - Over the next  90 days, patient will: o Take medications as prescribed o Report any questions or concerns to PharmD and/or provider(s)       Diabetes   Recent Relevant Labs: Lab Results  Component Value Date/Time   HGBA1C 6.8 (H) 12/08/2019 02:57 PM   HGBA1C 6.1 (H) 06/06/2018 08:58 AM   MICROALBUR 2.2 (H) 12/08/2019 02:57 PM     Checking BG: Never  Recent FBG Readings: n/a Recent pre-meal BG readings: n/a Recent 2hr PP BG readings:  n/a Recent HS BG readings: n/a Patient has failed these meds in past: n/a Patient is currently controlled on the following medications: none  Last diabetic Foot exam: No results found for: HMDIABEYEEXA  Last diabetic Eye exam: No results found for: HMDIABFOOTEX   We discussed: diet and exercise extensively. Typical diet for patient consists of  Breakfast: [place] Tomasa Blase or Ham or Sausage biscuit  Dinner: Bologna/Ham + cheese sandwich. Sometimes will eat at [employer].  Snacks: Oatmeal raisin cookies (1-2 daily) or banana.  Drinks: Water and regular pepsi throughout the day   No formal exercise, but stays active with chores (caring for farm animals, equipment maintenance)  Plan  Recommend low-carb diet. Discussed with patient DASH diet and switching to diet soda.    Heart Failure   Type: Diastolic  Last ejection fraction: 60-65% (08/06/19) NYHA Class: II (slight limitation of activity) AHA HF Stage: B (Heart disease present - no symptoms present)  Patient has failed these meds in past: n/a Patient is currently controlled on the following medications:   Carvedilol 3.125 mg BID  We discussed weighing daily; if you gain more than 3 pounds in one day or 5 pounds in one week call your doctor  Plan  Continue current medications   Hypertension   BP today is:  140/90  Office blood pressures are  BP Readings from Last 3 Encounters:  03/10/20 140/90  12/08/19 110/78  07/10/19 130/70   CMP Latest Ref Rng & Units 12/08/2019 06/09/2019 01/23/2019    Glucose 70 - 99 mg/dL 97 500(X) 381(W)  BUN 6 - 23 mg/dL 21 19 22   Creatinine 0.40 - 1.50 mg/dL ) 2.99(B) 7.16(R)  Sodium 135 - 145 mEq/L 141 128(L) 143  Potassium 3.5 - 5.1 mEq/L 5.0 3.6 4.8  Chloride 96 - 112 mEq/L 107 98 106  CO2 19 - 32 mEq/L 29 19(L) 29  Calcium 8.4 - 10.5 mg/dL 9.0 6.78(L) 9.1  Total Protein 6.0 - 8.3 g/dL 6.5 6.8 -  Total Bilirubin 0.2 - 1.2 mg/dL 0.6 3.8(B) -  Alkaline Phos 39 - 117 U/L 110 95 -  AST 0 - 37 U/L 19 15 -  ALT 0 - 53 U/L 21 15 -     Patient has failed these meds in the past: n/a Patient is currently controlled on the following medications:   Carvedilol 3.125 mg BID Patient checks BP at home Never  Patient home BP readings are ranging: n/a  We discussed diet and exercise extensively. Patient does not own blood pressure monitor, not interested at this time to use one. Denies dizziness, headaches, or chest pain.  Plan  Continue current medications   Hyperlipidemia  History of stroke  Lipid Panel     Component Value Date/Time   CHOL 109 12/08/2019 1457   TRIG 133.0 12/08/2019 1457   HDL 29.00 (L) 12/08/2019 1457   LDLCALC 54 12/08/2019 1457   LDLDIRECT 59.0 12/08/2019 1457     The ASCVD Risk score (Goff DC Jr., et al., 2013) failed to calculate for the following reasons:   The patient has a prior MI or stroke diagnosis   Patient has failed these meds in past: n/a Patient is currently controlled on the following medications:   Aspirin 81 mg daily  Atorvastatin 80 mg daily  Clopidogrel 75 mg daily  We discussed:  Denies unusual bruising/bleeding.    DAPT indefinitely per vascular surgery  Plan  Continue current medications   GERD   Patient has failed these meds in past:  Patient is currently controlled on the following medications:  Pantoprazole 40 mg daily  We discussed:  Has not experienced symptoms of GERD in a while (>2 years per patient recollection).   Could consider discontinuing/decreasing pantoprazole  to PRN use given control, but will defer to future visit.   Plan  Continue current medications  Chronic Pain/Cervical Myelopathy   Patient has failed these meds in past: Hydrocodone-APAP, methocarbamol   Patient is currently uncontrolled on the following medications:  APAP 650 mg q4hr PRN (not taking)   Ibuprofen 200-400 mg PRN    We discussed: Patient states serious frustrations with his pain and quality of life following surgery in September 2019. Patient prescribed Duloxetine 30 mg BID today for pain. Counseled patient on side effects of duloxetine and that it may take a few weeks to see the full effect. Counseled patient to avoid NSAID use due to bleeding risk with aspirin + plavix. Counseled patient on the use of acetaminophen as primary pain reliever and not to exceed 3000 mg daily.   Plan  Continue current medications   Misc/OTC   Vitamin B complex daily Ferosul 325 (65 fe) mg daily (unsure if taking) Sildenafil 60-100 mg daily PRN   Plan  Continue current medications  Vaccines   Reviewed and discussed patient's vaccination history.    Immunization History  Administered Date(s) Administered  . Fluad Quad(high Dose 65+) 06/26/2019  . Influenza, High Dose Seasonal PF 09/16/2017, 06/22/2018  . Janssen (J&J) SARS-COV-2 Vaccination 01/09/2020  . Pneumococcal Polysaccharide-23 09/16/2017    Plan  Recommended patient receive Prevnar, Shingrix vaccines  Medication Management   Pt uses [facility] pharmacy for all medications. Patient did not bring vials into clinic today and was very unfamiliar with his medicines. He was not familiar with names of his medicines or what he was taking them for, only knew them by the "red" or "yellow" pill.   Uses pill box? Yes  We discussed: Patient with recent frustrations with [facility] pharmacy (delays in getting medications, frustration with having to go to the pharmacy multiple times a month). Unfortunately, upstream pharmacy  services are disadvantaged for him.   Plan  Continue current medication management strategy   Follow up: 3 month phone visit  Garey Ham Clinical Pharmacist Corinda Gubler at [facility]  [phone]

## 2020-03-10 ENCOUNTER — Other Ambulatory Visit: Payer: Self-pay

## 2020-03-10 ENCOUNTER — Ambulatory Visit: Payer: Medicare Other

## 2020-03-10 ENCOUNTER — Encounter: Payer: Self-pay | Admitting: Family Medicine

## 2020-03-10 ENCOUNTER — Ambulatory Visit (INDEPENDENT_AMBULATORY_CARE_PROVIDER_SITE_OTHER): Payer: Medicare Other | Admitting: Family Medicine

## 2020-03-10 ENCOUNTER — Telehealth: Payer: Medicare Other

## 2020-03-10 VITALS — BP 140/90 | HR 69 | Temp 98.3°F | Wt 176.4 lb

## 2020-03-10 DIAGNOSIS — I10 Essential (primary) hypertension: Secondary | ICD-10-CM

## 2020-03-10 DIAGNOSIS — L989 Disorder of the skin and subcutaneous tissue, unspecified: Secondary | ICD-10-CM | POA: Diagnosis not present

## 2020-03-10 DIAGNOSIS — I2583 Coronary atherosclerosis due to lipid rich plaque: Secondary | ICD-10-CM

## 2020-03-10 DIAGNOSIS — G959 Disease of spinal cord, unspecified: Secondary | ICD-10-CM

## 2020-03-10 DIAGNOSIS — M4712 Other spondylosis with myelopathy, cervical region: Secondary | ICD-10-CM | POA: Diagnosis not present

## 2020-03-10 DIAGNOSIS — F321 Major depressive disorder, single episode, moderate: Secondary | ICD-10-CM

## 2020-03-10 DIAGNOSIS — I2510 Atherosclerotic heart disease of native coronary artery without angina pectoris: Secondary | ICD-10-CM

## 2020-03-10 DIAGNOSIS — I5021 Acute systolic (congestive) heart failure: Secondary | ICD-10-CM

## 2020-03-10 MED ORDER — DULOXETINE HCL 30 MG PO CPEP: 30.0000 mg | ORAL_CAPSULE | Freq: Two times a day (BID) | ORAL | 1 refills | Status: DC

## 2020-03-10 NOTE — Progress Notes (Signed)
Established Patient Office Visit  Subjective:  Patient ID: Ian Ramos, male    DOB: July 21, 1946  Age: 74 y.o. MRN: [id number]  CC:  Chief Complaint  Patient presents with  . Follow-up    Patient is here today for a 24-month-F/U.  At 3.9.21 visit several labs were completed due to pt C/O fatigue etc. All labs looked good.  A1C had risen to 6.8 from 6.1 one year prior. At 3.9.21 visit he was given Sildenafil for ED. I asked how he is doing and he said "I'm not worth a d@#&."    HPI Ian Ramos presents for follow-up of his neck pain.  Neck pain and stiffness is persisting.  He has tried to do some exercises at home without relief.  Another surgery had been recommended and he had been scheduled to start physical therapy.  Both of these events did not occur secondary to pandemic issues.  Lesion on right upper back has still not been excised.  Surgeon retired he tells me.  Past Medical History:  Diagnosis Date  . Arthritis   . MI (myocardial infarction) (HCC)   . Neuromuscular disorder (HCC)   . Stroke [facility])     Past Surgical History:  Procedure Laterality Date  . CAROTID PTA/STENT INTERVENTION N/A 06/27/2018   Procedure: CAROTID PTA/STENT INTERVENTION;  Surgeon: Sherren Kerns, MD;  Location: [facility] INVASIVE CV LAB;  Service: Cardiovascular;  Laterality: N/A;  . LEFT HEART CATH AND CORONARY ANGIOGRAPHY N/A 06/05/2018   Procedure: LEFT HEART CATH AND CORONARY ANGIOGRAPHY;  Surgeon: Swaziland, Peter M, MD;  Location: [facility] INVASIVE CV LAB;  Service: Cardiovascular;  Laterality: N/A;  . POSTERIOR CERVICAL FUSION/FORAMINOTOMY N/A 05/23/2018   Procedure: Posterior Cervical Fusion with lateral mass fixation - C1 - C6 with laminectomy;  Surgeon: Julio Sicks, MD;  Location: [facility] OR;  Service: Neurosurgery;  Laterality: N/A;    Family History  Problem Relation Age of Onset  . Hypertension Other   . Heart failure Mother   . CAD Father        s/p CABG in his 34s  . Heart failure Father      Social History   Socioeconomic History  . Marital status: Divorced    Spouse name: Not on file  . Number of children: Not on file  . Years of education: Not on file  . Highest education level: Not on file  Occupational History  . Not on file  Tobacco Use  . Smoking status: Never Smoker  . Smokeless tobacco: Current User    Types: Chew  Vaping Use  . Vaping Use: Never used  Substance and Sexual Activity  . Alcohol use: No  . Drug use: No  . Sexual activity: Not on file  Other Topics Concern  . Not on file  Social History Narrative   NA   Social Determinants of Health   Financial Resource Strain:   . Difficulty of Paying Living Expenses:   Food Insecurity:   . Worried About Programme researcher, broadcasting/film/video in the Last Year:   . Barista in the Last Year:   Transportation Needs:   . Freight forwarder (Medical):   Marland Kitchen Lack of Transportation (Non-Medical):   Physical Activity:   . Days of Exercise per Week:   . Minutes of Exercise per Session:   Stress:   . Feeling of Stress :   Social Connections:   . Frequency of Communication with Friends and Family:   . Frequency  of Social Gatherings with Friends and Family:   . Attends Religious Services:   . Active Member of Clubs or Organizations:   . Attends Banker Meetings:   Marland Kitchen Marital Status:   Intimate Partner Violence:   . Fear of Current or Ex-Partner:   . Emotionally Abused:   Marland Kitchen Physically Abused:   . Sexually Abused:     Outpatient Medications Prior to Visit  Medication Sig Dispense Refill  . acetaminophen (TYLENOL) 325 MG tablet Take 2 tablets (650 mg total) by mouth every 4 (four) hours as needed for headache or mild pain.    Marland Kitchen aspirin EC 81 MG tablet Take 81 mg by mouth daily.    Marland Kitchen atorvastatin (LIPITOR) 80 MG tablet Take 1 tablet (80 mg total) by mouth daily. 90 tablet 2  . B Complex Vitamins (B COMPLEX-B12) TABS Take one tablet daily 90 tablet 1  . carvedilol (COREG) 3.125 MG tablet TAKE 1  TABLET(3.125 MG) BY MOUTH TWICE DAILY WITH A MEAL 180 tablet 0  . clopidogrel (PLAVIX) 75 MG tablet PLACE 1 TABLET INTO FEEDING TUBE DAILY 90 tablet 2  . FEROSUL 325 (65 Fe) MG tablet TAKE 1 TABLET(325 MG) BY MOUTH DAILY WITH BREAKFAST 90 tablet 1  . HYDROcodone-acetaminophen (NORCO) 5-325 MG tablet Take 1 tablet by mouth every 6 (six) hours as needed for moderate pain. 60 tablet 0  . methocarbamol (ROBAXIN) 500 MG tablet Take 1 tablet (500 mg total) by mouth every 8 (eight) hours as needed for muscle spasms. 30 tablet 0  . pantoprazole (PROTONIX) 40 MG tablet Take 1 tablet (40 mg total) by mouth daily. 90 tablet 2  . sildenafil (REVATIO) 20 MG tablet May take 3-5 pills daily 45 minutes prior... 25 tablet 2  . polyethylene glycol powder (GLYCOLAX/MIRALAX) 17 GM/SCOOP powder Take 17 g by mouth 2 (two) times daily as needed. 3350 g 1   Facility-Administered Medications Prior to Visit  Medication Dose Route Frequency Provider Last Rate Last Admin  . etomidate (AMIDATE) injection    Anesthesia Intra-op Lucinda Dell, CRNA   12 mg at 06/09/18 1445  . succinylcholine (ANECTINE) injection    Anesthesia Intra-op Lucinda Dell, CRNA   100 mg at 06/09/18 1445    No Known Allergies  ROS Review of Systems  Constitutional: Negative.   Respiratory: Negative.   Cardiovascular: Negative.   Gastrointestinal: Negative.   Genitourinary: Negative.   Musculoskeletal: Positive for neck pain and neck stiffness.  Neurological: Positive for weakness and numbness.  Hematological: Does not bruise/bleed easily.      Depression screen [facility] 2/9 03/10/2020 12/08/2019  Decreased Interest 3 0  Down, Depressed, Hopeless 3 0  PHQ - 2 Score 6 0  Altered sleeping 0 -  Tired, decreased energy 3 -  Change in appetite 0 -  Feeling bad or failure about yourself  3 -  Trouble concentrating 0 -  Moving slowly or fidgety/restless 0 -  Suicidal thoughts 0 -  PHQ-9 Score 12 -  Difficult doing work/chores Very  difficult -      Objective:    Physical Exam Constitutional:      General: He is not in acute distress.    Appearance: Normal appearance. He is normal weight. He is not ill-appearing, toxic-appearing or diaphoretic.  HENT:     Head: Normocephalic and atraumatic.     Right Ear: External ear normal.     Left Ear: External ear normal.  Eyes:     General:  Right eye: No discharge.        Left eye: No discharge.     Extraocular Movements: Extraocular movements intact.     Conjunctiva/sclera: Conjunctivae normal.  Cardiovascular:     Rate and Rhythm: Normal rate and regular rhythm.  Pulmonary:     Effort: Pulmonary effort is normal.     Breath sounds: Normal breath sounds.  Musculoskeletal:     Right shoulder: Decreased range of motion. Decreased strength.     Left shoulder: Decreased range of motion. Decreased strength.     Cervical back: No tenderness or bony tenderness. Decreased range of motion.       Back:  Skin:    General: Skin is warm and dry.  Neurological:     Mental Status: He is alert.  Psychiatric:        Mood and Affect: Mood normal.        Behavior: Behavior normal.     BP 140/90 (BP Location: Left Arm, Patient Position: Sitting, Cuff Size: Normal)   Pulse 69   Temp 98.3 F (36.8 C) (Temporal)   Wt 176 lb 6.4 oz (80 kg)   SpO2 98%   BMI 27.63 kg/m  Wt Readings from Last 3 Encounters:  03/10/20 176 lb 6.4 oz (80 kg)  12/08/19 180 lb 3.2 oz (81.7 kg)  06/26/19 177 lb (80.3 kg)     There are no preventive care reminders to display for this patient.  There are no preventive care reminders to display for this patient.  Lab Results  Component Value Date   TSH 2.45 12/08/2019   Lab Results  Component Value Date   WBC 8.9 12/08/2019   HGB 13.7 12/08/2019   HCT 40.5 12/08/2019   MCV 89.0 12/08/2019   PLT 173.0 12/08/2019   Lab Results  Component Value Date   NA 141 12/08/2019   K 5.0 12/08/2019   CO2 29 12/08/2019   GLUCOSE 97  12/08/2019   BUN 21 12/08/2019   CREATININE 1.58 (H) 12/08/2019   BILITOT 0.6 12/08/2019   ALKPHOS 110 12/08/2019   AST 19 12/08/2019   ALT 21 12/08/2019   PROT 6.5 12/08/2019   ALBUMIN 3.9 12/08/2019   CALCIUM 9.0 12/08/2019   ANIONGAP 11 06/09/2019   GFR 42.93 (L) 12/08/2019   Lab Results  Component Value Date   CHOL 109 12/08/2019   Lab Results  Component Value Date   HDL 29.00 (L) 12/08/2019   Lab Results  Component Value Date   LDLCALC 54 12/08/2019   Lab Results  Component Value Date   TRIG 133.0 12/08/2019   Lab Results  Component Value Date   CHOLHDL 4 12/08/2019   Lab Results  Component Value Date   HGBA1C 6.8 (H) 12/08/2019      Assessment & Plan:   Problem List Items Addressed This Visit      Nervous and Auditory   Cervical myelopathy (HCC)   Relevant Orders   Ambulatory referral to Neurosurgery   Spondylosis, cervical, with myelopathy - Primary   Relevant Orders   Ambulatory referral to Neurosurgery     Musculoskeletal and Integument   Skin lesion of back   Relevant Orders   Ambulatory referral to General Surgery    Other Visit Diagnoses    Depression, major, single episode, moderate (HCC)       Relevant Medications   DULoxetine (CYMBALTA) 30 MG capsule      Meds ordered this encounter  Medications  . DULoxetine (CYMBALTA) 30 MG  capsule    Sig: Take 1 capsule (30 mg total) by mouth 2 (two) times daily.    Dispense:  60 capsule    Refill:  1    Follow-up: Return in about 1 month (around 04/09/2020).    Libby Maw, MD

## 2020-03-10 NOTE — Patient Instructions (Signed)
Visit Information It was great speaking with you today!  Please let me know if you have any questions about our visit.  Goals Addressed            This Visit's Progress   . Chronic Care Management       CARE PLAN ENTRY  Current Barriers:  . Chronic Disease Management support, education, and care coordination needs related to Hypertension, Hyperlipidemia, Heart Failure, Coronary Artery Disease, GERD, Chronic Kidney Disease, and Chronic pain   Hypertension . Pharmacist Clinical Goal(s): o Over the next 90 days, patient will work with PharmD and providers to maintain BP goal <140/90 . Current regimen:  o Carvedilol 3.125 mg BID . Interventions: o Recommend purchasing blood pressure monitor at [employer] . Patient self care activities - Over the next 90 days, patient will: o Check blood pressure weekly, document, and provide at future appointments o Ensure daily salt intake < 2300 mg/day  Heart Failure . Pharmacist Clinical Goal(s): o Over the next 90 days, patient will work with PharmD and providers to maintain BP goal <140/90 . Current regimen:  o Carvedilol 3.125 mg BID . Patient self care activities - Over the next 90 days, patient will: o Check weight daily, document, and provide at future appointments o Limit salt intake to less than 2300 mg/day  Hyperlipidemia . Pharmacist Clinical Goal(s): o Over the next 90 days, patient will work with PharmD and providers to maintain LDL goal < 70 . Current regimen:  o Atorvastatin 80 mg  Medication management . Pharmacist Clinical Goal(s): o Over the next 90 days, patient will work with PharmD and providers to maintain optimal medication adherence . Current pharmacy: [facility] . Interventions o Comprehensive medication review performed. o Continue current medication management strategy . Patient self care activities - Over the next 90 days, patient will: o Take medications as prescribed o Report any questions or concerns to  PharmD and/or provider(s)       Mr. Delduca was given information about Chronic Care Management services today including:  1. CCM service includes personalized support from designated clinical staff supervised by his physician, including individualized plan of care and coordination with other care providers 2. 24/7 contact phone numbers for assistance for urgent and routine care needs. 3. Standard insurance, coinsurance, copays and deductibles apply for chronic care management only during months in which we provide at least 20 minutes of these services. Most insurances cover these services at 100%, however patients may be responsible for any copay, coinsurance and/or deductible if applicable. This service may help you avoid the need for more expensive face-to-face services. 4. Only one practitioner may furnish and bill the service in a calendar month. 5. The patient may stop CCM services at any time (effective at the end of the month) by phone call to the office staff.  Patient agreed to services and verbal consent obtained.   The patient verbalized understanding of instructions provided today and agreed to receive a mailed copy of patient instruction and/or educational materials. Face to Face appointment with pharmacist scheduled for:  06/09/20 at 1:00 PM  Garey Ham Clinical Pharmacist Corinda Gubler at [facility]  [phone]

## 2020-03-15 NOTE — Addendum Note (Signed)
Addended by: Lake Bells on: 03/15/2020 10:03 AM   Modules accepted: Orders

## 2020-04-05 ENCOUNTER — Ambulatory Visit (INDEPENDENT_AMBULATORY_CARE_PROVIDER_SITE_OTHER): Payer: Medicare Other | Admitting: Family Medicine

## 2020-04-05 ENCOUNTER — Other Ambulatory Visit: Payer: Self-pay

## 2020-04-05 ENCOUNTER — Encounter: Payer: Self-pay | Admitting: Family Medicine

## 2020-04-05 VITALS — BP 118/64 | HR 61 | Temp 98.0°F | Ht 67.0 in | Wt 180.0 lb

## 2020-04-05 DIAGNOSIS — G959 Disease of spinal cord, unspecified: Secondary | ICD-10-CM

## 2020-04-05 DIAGNOSIS — I10 Essential (primary) hypertension: Secondary | ICD-10-CM

## 2020-04-05 DIAGNOSIS — L989 Disorder of the skin and subcutaneous tissue, unspecified: Secondary | ICD-10-CM

## 2020-04-05 NOTE — Progress Notes (Signed)
Established Patient Office Visit  Subjective:  Patient ID: Ian Ramos, male    DOB: 09-09-1946  Age: 74 y.o. MRN: [id number]  CC:  Chief Complaint  Patient presents with  . Follow-up    follow up on BP, patient wouldlike to go over current medications.     HPI Ian Ramos presents for follow-up of hypertension, granulomatous skin lesion on right upper back, cervical myelopathy.  Patient was unable to tolerate Cymbalta because nausea and body sweats.  He had to discontinue it.  Continues to experience neck pain but does admit relief with Norco therapy.  Blood pressure is well controlled with his current medical regimen.  Has been referred to general surgery for excision of right upper back lesion but has not heard yet.  Accompanied by his significant other today.  Patient tells me that Dr. Dutch Quint had suggested cervical manipulation for patient under anesthesia.  Past Medical History:  Diagnosis Date  . Arthritis   . MI (myocardial infarction) (HCC)   . Neuromuscular disorder (HCC)   . Stroke [facility])     Past Surgical History:  Procedure Laterality Date  . CAROTID PTA/STENT INTERVENTION N/A 06/27/2018   Procedure: CAROTID PTA/STENT INTERVENTION;  Surgeon: Sherren Kerns, MD;  Location: [facility] INVASIVE CV LAB;  Service: Cardiovascular;  Laterality: N/A;  . LEFT HEART CATH AND CORONARY ANGIOGRAPHY N/A 06/05/2018   Procedure: LEFT HEART CATH AND CORONARY ANGIOGRAPHY;  Surgeon: Swaziland, Peter M, MD;  Location: [facility] INVASIVE CV LAB;  Service: Cardiovascular;  Laterality: N/A;  . POSTERIOR CERVICAL FUSION/FORAMINOTOMY N/A 05/23/2018   Procedure: Posterior Cervical Fusion with lateral mass fixation - C1 - C6 with laminectomy;  Surgeon: Julio Sicks, MD;  Location: [facility] OR;  Service: Neurosurgery;  Laterality: N/A;    Family History  Problem Relation Age of Onset  . Hypertension Other   . Heart failure Mother   . CAD Father        s/p CABG in his 1s  . Heart failure Father      Social History   Socioeconomic History  . Marital status: Divorced    Spouse name: Not on file  . Number of children: Not on file  . Years of education: Not on file  . Highest education level: Not on file  Occupational History  . Not on file  Tobacco Use  . Smoking status: Never Smoker  . Smokeless tobacco: Current User    Types: Chew  Vaping Use  . Vaping Use: Never used  Substance and Sexual Activity  . Alcohol use: No  . Drug use: No  . Sexual activity: Not on file  Other Topics Concern  . Not on file  Social History Narrative   NA   Social Determinants of Health   Financial Resource Strain: Low Risk   . Difficulty of Paying Living Expenses: Not hard at all  Food Insecurity:   . Worried About Programme researcher, broadcasting/film/video in the Last Year:   . Barista in the Last Year:   Transportation Needs: No Transportation Needs  . Lack of Transportation (Medical): No  . Lack of Transportation (Non-Medical): No  Physical Activity:   . Days of Exercise per Week:   . Minutes of Exercise per Session:   Stress:   . Feeling of Stress :   Social Connections:   . Frequency of Communication with Friends and Family:   . Frequency of Social Gatherings with Friends and Family:   . Attends Religious Services:   .  Active Member of Clubs or Organizations:   . Attends Banker Meetings:   Marland Kitchen Marital Status:   Intimate Partner Violence:   . Fear of Current or Ex-Partner:   . Emotionally Abused:   Marland Kitchen Physically Abused:   . Sexually Abused:     Outpatient Medications Prior to Visit  Medication Sig Dispense Refill  . acetaminophen (TYLENOL) 325 MG tablet Take 2 tablets (650 mg total) by mouth every 4 (four) hours as needed for headache or mild pain.    Marland Kitchen aspirin EC 81 MG tablet Take 81 mg by mouth daily.    Marland Kitchen atorvastatin (LIPITOR) 80 MG tablet Take 1 tablet (80 mg total) by mouth daily. 90 tablet 2  . B Complex Vitamins (B COMPLEX-B12) TABS Take one tablet daily 90 tablet  1  . carvedilol (COREG) 3.125 MG tablet TAKE 1 TABLET(3.125 MG) BY MOUTH TWICE DAILY WITH A MEAL 180 tablet 0  . clopidogrel (PLAVIX) 75 MG tablet PLACE 1 TABLET INTO FEEDING TUBE DAILY 90 tablet 2  . FEROSUL 325 (65 Fe) MG tablet TAKE 1 TABLET(325 MG) BY MOUTH DAILY WITH BREAKFAST 90 tablet 1  . HYDROcodone-acetaminophen (NORCO) 5-325 MG tablet Take 1 tablet by mouth every 6 (six) hours as needed for moderate pain. (Patient not taking: Reported on 04/05/2020) 60 tablet 0  . methocarbamol (ROBAXIN) 500 MG tablet Take 1 tablet (500 mg total) by mouth every 8 (eight) hours as needed for muscle spasms. (Patient not taking: Reported on 04/05/2020) 30 tablet 0  . pantoprazole (PROTONIX) 40 MG tablet Take 1 tablet (40 mg total) by mouth daily. (Patient not taking: Reported on 04/05/2020) 90 tablet 2  . sildenafil (REVATIO) 20 MG tablet May take 3-5 pills daily 45 minutes prior... (Patient not taking: Reported on 04/05/2020) 25 tablet 2  . DULoxetine (CYMBALTA) 30 MG capsule Take 1 capsule (30 mg total) by mouth 2 (two) times daily. (Patient not taking: Reported on 04/05/2020) 60 capsule 1   Facility-Administered Medications Prior to Visit  Medication Dose Route Frequency Provider Last Rate Last Admin  . etomidate (AMIDATE) injection    Anesthesia Intra-op Lucinda Dell, CRNA   12 mg at 06/09/18 1445  . succinylcholine (ANECTINE) injection    Anesthesia Intra-op Lucinda Dell, CRNA   100 mg at 06/09/18 1445    Allergies  Allergen Reactions  . Cymbalta [Duloxetine Hcl] Nausea And Vomiting    ROS Review of Systems  Constitutional: Negative.   HENT: Negative.   Eyes: Negative for photophobia and visual disturbance.  Respiratory: Negative.   Cardiovascular: Negative.   Gastrointestinal: Negative.   Genitourinary: Negative.   Musculoskeletal: Positive for neck pain and neck stiffness.  Skin: Positive for color change and wound.  Neurological: Positive for weakness and numbness.       Objective:    Physical Exam Vitals and nursing note reviewed.  Constitutional:      General: He is not in acute distress.    Appearance: Normal appearance. He is not ill-appearing or toxic-appearing.  HENT:     Head: Normocephalic and atraumatic.     Right Ear: External ear normal.     Left Ear: External ear normal.  Eyes:     General: No scleral icterus.       Right eye: No discharge.        Left eye: No discharge.     Conjunctiva/sclera: Conjunctivae normal.  Cardiovascular:     Rate and Rhythm: Normal rate and regular rhythm.  Pulmonary:  Effort: Pulmonary effort is normal.     Breath sounds: Normal breath sounds.  Musculoskeletal:     Right lower leg: No edema.     Left lower leg: No edema.  Skin:    Findings: Lesion present.       Neurological:     Mental Status: He is alert and oriented to person, place, and time.  Psychiatric:        Mood and Affect: Mood normal.        Behavior: Behavior normal.     BP 118/64   Pulse 61   Temp 98 F (36.7 C) (Tympanic)   Ht 5\' 7"  (1.702 m)   Wt 180 lb (81.6 kg)   SpO2 95%   BMI 28.19 kg/m  Wt Readings from Last 3 Encounters:  04/05/20 180 lb (81.6 kg)  03/10/20 176 lb 6.4 oz (80 kg)  12/08/19 180 lb 3.2 oz (81.7 kg)     There are no preventive care reminders to display for this patient.  There are no preventive care reminders to display for this patient.  Lab Results  Component Value Date   TSH 2.45 12/08/2019   Lab Results  Component Value Date   WBC 8.9 12/08/2019   HGB 13.7 12/08/2019   HCT 40.5 12/08/2019   MCV 89.0 12/08/2019   PLT 173.0 12/08/2019   Lab Results  Component Value Date   NA 141 12/08/2019   K 5.0 12/08/2019   CO2 29 12/08/2019   GLUCOSE 97 12/08/2019   BUN 21 12/08/2019   CREATININE 1.58 (H) 12/08/2019   BILITOT 0.6 12/08/2019   ALKPHOS 110 12/08/2019   AST 19 12/08/2019   ALT 21 12/08/2019   PROT 6.5 12/08/2019   ALBUMIN 3.9 12/08/2019   CALCIUM 9.0 12/08/2019    ANIONGAP 11 06/09/2019   GFR 42.93 (L) 12/08/2019   Lab Results  Component Value Date   CHOL 109 12/08/2019   Lab Results  Component Value Date   HDL 29.00 (L) 12/08/2019   Lab Results  Component Value Date   LDLCALC 54 12/08/2019   Lab Results  Component Value Date   TRIG 133.0 12/08/2019   Lab Results  Component Value Date   CHOLHDL 4 12/08/2019   Lab Results  Component Value Date   HGBA1C 6.8 (H) 12/08/2019      Assessment & Plan:   Problem List Items Addressed This Visit      Cardiovascular and Mediastinum   Essential hypertension     Nervous and Auditory   Cervical myelopathy (HCC)     Musculoskeletal and Integument   Skin lesion of back - Primary   Relevant Orders   Ambulatory referral to General Surgery      No orders of the defined types were placed in this encounter.   Follow-up: Return in about 3 months (around 07/06/2020).   Continue current medications for hypertension.  Continue Norco for neck pain.  Surgery referral for excision of lesion on right upper back.  Suggested follow-up with Dr. 09/05/2020 for his cervical myelopathy. Dutch Quint, MD

## 2020-04-26 ENCOUNTER — Telehealth: Payer: Self-pay | Admitting: Family Medicine

## 2020-04-26 NOTE — Progress Notes (Signed)
  Chronic Care Management   Outreach Note  04/26/2020 Name: Ian Ramos MRN: [id number] DOB: 1945/12/14  Referred by: Mliss Sax, MD Reason for referral : No chief complaint on file.   A second unsuccessful telephone outreach was attempted today. The patient was referred to pharmacist for assistance with care management and care coordination.  Follow Up Plan:   Carley Perdue [employer] Scheduler

## 2020-04-28 ENCOUNTER — Other Ambulatory Visit: Payer: Self-pay | Admitting: Neurosurgery

## 2020-04-28 ENCOUNTER — Telehealth: Payer: Self-pay | Admitting: Family Medicine

## 2020-04-28 DIAGNOSIS — M4802 Spinal stenosis, cervical region: Secondary | ICD-10-CM

## 2020-04-28 NOTE — Progress Notes (Signed)
  Chronic Care Management   Outreach Note  04/28/2020 Name: WOODY KRONBERG MRN: [id number] DOB: 05/08/1946  Referred by: Mliss Sax, MD Reason for referral : No chief complaint on file.   Third unsuccessful telephone outreach was attempted today. The patient was referred to the pharmacist for assistance with care management and care coordination.   Follow Up Plan:   Carley Perdue [employer] Scheduler

## 2020-04-29 ENCOUNTER — Telehealth: Payer: Self-pay | Admitting: Family Medicine

## 2020-04-29 NOTE — Progress Notes (Signed)
  Chronic Care Management   Note  04/29/2020 Name: Ian Ramos MRN: [id number] DOB: [birth year]  Ian Ramos is a 74 y.o. year old male who is a primary care patient of Mliss Sax, MD. I reached out to Ian Ramos by phone today in response to a referral sent by [name]'s PCP, Mliss Sax, MD.   [name] was given information about Chronic Care Management services today including:  1. CCM service includes personalized support from designated clinical staff supervised by his physician, including individualized plan of care and coordination with other care providers 2. 24/7 contact phone numbers for assistance for urgent and routine care needs. 3. Service will only be billed when office clinical staff spend 20 minutes or more in a month to coordinate care. 4. Only one practitioner may furnish and bill the service in a calendar month. 5. The patient may stop CCM services at any time (effective at the end of the month) by phone call to the office staff.   Patient agreed to services and verbal consent obtained.   Follow up plan:   Carley Perdue [employer] Scheduler

## 2020-05-02 ENCOUNTER — Telehealth: Payer: Self-pay | Admitting: Family Medicine

## 2020-05-02 NOTE — Telephone Encounter (Signed)
Left message for patient to schedule Annual Wellness Visit.  Please schedule with Nurse Health Advisor Martha Stanley, RN at [place]  °

## 2020-05-09 ENCOUNTER — Telehealth: Payer: Self-pay

## 2020-05-09 NOTE — Progress Notes (Signed)
I have attempted without success to contact this patient by phone three times to do his Hypertension  Disease State call. I left a Voice message for patient to return my call.  Bessie Kellihan,CPA Clinical Pharmacist Assistant [phone]   

## 2020-05-11 ENCOUNTER — Other Ambulatory Visit: Payer: Medicare Other

## 2020-05-12 ENCOUNTER — Other Ambulatory Visit: Payer: Self-pay | Admitting: Family Medicine

## 2020-05-12 DIAGNOSIS — I2510 Atherosclerotic heart disease of native coronary artery without angina pectoris: Secondary | ICD-10-CM

## 2020-05-12 DIAGNOSIS — I10 Essential (primary) hypertension: Secondary | ICD-10-CM

## 2020-05-16 ENCOUNTER — Other Ambulatory Visit: Payer: Self-pay

## 2020-05-16 ENCOUNTER — Ambulatory Visit
Admission: RE | Admit: 2020-05-16 | Discharge: 2020-05-16 | Disposition: A | Discharge status: Home / Self Care | Payer: Medicare Other | Source: Ambulatory Visit | Attending: Neurosurgery | Admitting: Neurosurgery

## 2020-05-16 ENCOUNTER — Other Ambulatory Visit: Payer: Self-pay | Admitting: Family Medicine

## 2020-05-16 DIAGNOSIS — M4802 Spinal stenosis, cervical region: Secondary | ICD-10-CM

## 2020-05-16 DIAGNOSIS — D649 Anemia, unspecified: Secondary | ICD-10-CM

## 2020-05-19 ENCOUNTER — Ambulatory Visit: Payer: Medicare Other | Admitting: Family Medicine

## 2020-05-23 ENCOUNTER — Ambulatory Visit (INDEPENDENT_AMBULATORY_CARE_PROVIDER_SITE_OTHER): Payer: Medicare Other | Admitting: Family Medicine

## 2020-05-23 ENCOUNTER — Encounter: Payer: Self-pay | Admitting: Family Medicine

## 2020-05-23 ENCOUNTER — Other Ambulatory Visit: Payer: Self-pay

## 2020-05-23 VITALS — BP 132/70 | HR 67 | Temp 97.8°F | Ht 67.0 in | Wt 175.2 lb

## 2020-05-23 DIAGNOSIS — N1832 Chronic kidney disease, stage 3b: Secondary | ICD-10-CM

## 2020-05-23 DIAGNOSIS — F321 Major depressive disorder, single episode, moderate: Secondary | ICD-10-CM

## 2020-05-23 DIAGNOSIS — E538 Deficiency of other specified B group vitamins: Secondary | ICD-10-CM

## 2020-05-23 DIAGNOSIS — I10 Essential (primary) hypertension: Secondary | ICD-10-CM

## 2020-05-23 DIAGNOSIS — E611 Iron deficiency: Secondary | ICD-10-CM | POA: Diagnosis not present

## 2020-05-23 NOTE — Progress Notes (Signed)
Established Patient Office Visit  Subjective:  Patient ID: Ian Ramos, male    DOB: [birth year]  Age: 74 y.o. MRN: [id number]  CC:  Chief Complaint  Patient presents with  . Follow-up    follow up on BP, no concerns    HPI Ian Ramos presents for follow-up of hypertension, B12 deficiency, iron deficiency, chronic cervical pain and depression.  Describes an episode of diaphoresis and lightheadedness the other day.  There was no chest pain shortness of breath nausea or palpitation.  Patient describes ongoing neck pain.  His neurosurgeon has told him there is not much to do about it at this point in time.  Continues with high-dose B12 and iron therapy.  Blood pressure has been controlled with current therapy. Past Medical History:  Diagnosis Date  . Arthritis   . MI (myocardial infarction) (HCC)   . Neuromuscular disorder (HCC)   . Stroke [facility])     Past Surgical History:  Procedure Laterality Date  . CAROTID PTA/STENT INTERVENTION N/A 06/27/2018   Procedure: CAROTID PTA/STENT INTERVENTION;  Surgeon: Sherren Kerns, MD;  Location: [facility] INVASIVE CV LAB;  Service: Cardiovascular;  Laterality: N/A;  . LEFT HEART CATH AND CORONARY ANGIOGRAPHY N/A 06/05/2018   Procedure: LEFT HEART CATH AND CORONARY ANGIOGRAPHY;  Surgeon: Swaziland, Peter M, MD;  Location: [facility] INVASIVE CV LAB;  Service: Cardiovascular;  Laterality: N/A;  . POSTERIOR CERVICAL FUSION/FORAMINOTOMY N/A 05/23/2018   Procedure: Posterior Cervical Fusion with lateral mass fixation - C1 - C6 with laminectomy;  Surgeon: Julio Sicks, MD;  Location: [facility] OR;  Service: Neurosurgery;  Laterality: N/A;    Family History  Problem Relation Age of Onset  . Hypertension Other   . Heart failure Mother   . CAD Father        s/p CABG in his 24s  . Heart failure Father     Social History   Socioeconomic History  . Marital status: Divorced    Spouse name: Not on file  . Number of children: Not on file  . Years of education:  Not on file  . Highest education level: Not on file  Occupational History  . Not on file  Tobacco Use  . Smoking status: Never Smoker  . Smokeless tobacco: Current User    Types: Chew  Vaping Use  . Vaping Use: Never used  Substance and Sexual Activity  . Alcohol use: No  . Drug use: No  . Sexual activity: Not on file  Other Topics Concern  . Not on file  Social History Narrative   NA   Social Determinants of Health   Financial Resource Strain: Low Risk   . Difficulty of Paying Living Expenses: Not hard at all  Food Insecurity:   . Worried About Programme researcher, broadcasting/film/video in the Last Year: Not on file  . Ran Out of Food in the Last Year: Not on file  Transportation Needs: No Transportation Needs  . Lack of Transportation (Medical): No  . Lack of Transportation (Non-Medical): No  Physical Activity:   . Days of Exercise per Week: Not on file  . Minutes of Exercise per Session: Not on file  Stress:   . Feeling of Stress : Not on file  Social Connections:   . Frequency of Communication with Friends and Family: Not on file  . Frequency of Social Gatherings with Friends and Family: Not on file  . Attends Religious Services: Not on file  . Active Member of Clubs or Organizations:  Not on file  . Attends Banker Meetings: Not on file  . Marital Status: Not on file  Intimate Partner Violence:   . Fear of Current or Ex-Partner: Not on file  . Emotionally Abused: Not on file  . Physically Abused: Not on file  . Sexually Abused: Not on file    Outpatient Medications Prior to Visit  Medication Sig Dispense Refill  . acetaminophen (TYLENOL) 325 MG tablet Take 2 tablets (650 mg total) by mouth every 4 (four) hours as needed for headache or mild pain.    Marland Kitchen aspirin EC 81 MG tablet Take 81 mg by mouth daily.    Marland Kitchen atorvastatin (LIPITOR) 80 MG tablet TAKE 1 TABLET(80 MG) BY MOUTH DAILY 90 tablet 2  . B Complex Vitamins (B COMPLEX-B12) TABS Take one tablet daily 90 tablet 1  .  carvedilol (COREG) 3.125 MG tablet TAKE 1 TABLET(3.125 MG) BY MOUTH TWICE DAILY WITH A MEAL 180 tablet 0  . clopidogrel (PLAVIX) 75 MG tablet Take 75 mg by mouth daily.    . FEROSUL 325 (65 Fe) MG tablet TAKE 1 TABLET(325 MG) BY MOUTH DAILY WITH BREAKFAST 90 tablet 1  . HYDROcodone-acetaminophen (NORCO) 5-325 MG tablet Take 1 tablet by mouth every 6 (six) hours as needed for moderate pain. (Patient not taking: Reported on 04/05/2020) 60 tablet 0  . methocarbamol (ROBAXIN) 500 MG tablet Take 1 tablet (500 mg total) by mouth every 8 (eight) hours as needed for muscle spasms. (Patient not taking: Reported on 04/05/2020) 30 tablet 0  . pantoprazole (PROTONIX) 40 MG tablet Take 1 tablet (40 mg total) by mouth daily. (Patient not taking: Reported on 04/05/2020) 90 tablet 2  . sildenafil (REVATIO) 20 MG tablet May take 3-5 pills daily 45 minutes prior... (Patient not taking: Reported on 04/05/2020) 25 tablet 2  . clopidogrel (PLAVIX) 75 MG tablet PLACE 1 TABLET INTO FEEDING TUBE DAILY 90 tablet 2   Facility-Administered Medications Prior to Visit  Medication Dose Route Frequency Provider Last Rate Last Admin  . etomidate (AMIDATE) injection    Anesthesia Intra-op Lucinda Dell, CRNA   12 mg at 06/09/18 1445  . succinylcholine (ANECTINE) injection    Anesthesia Intra-op Lucinda Dell, CRNA   100 mg at 06/09/18 1445    Allergies  Allergen Reactions  . Cymbalta [Duloxetine Hcl] Nausea And Vomiting    ROS Review of Systems  Constitutional: Negative.   HENT: Negative.   Eyes: Negative for photophobia and visual disturbance.  Respiratory: Negative.   Cardiovascular: Negative.   Gastrointestinal: Negative.   Endocrine: Negative for polyphagia and polyuria.  Genitourinary: Negative.   Musculoskeletal: Positive for neck pain and neck stiffness.   Depression screen [facility] 2/9 05/23/2020 04/05/2020 03/10/2020  Decreased Interest 3 1 3   Down, Depressed, Hopeless 3 0 3  PHQ - 2 Score 6 1 6   Altered  sleeping 0 0 0  Tired, decreased energy 3 2 3   Change in appetite 0 0 0  Feeling bad or failure about yourself  0 0 3  Trouble concentrating 0 0 0  Moving slowly or fidgety/restless 0 0 0  Suicidal thoughts 0 0 0  PHQ-9 Score 9 3 12   Difficult doing work/chores Not difficult at all Not difficult at all Very difficult      Objective:    Physical Exam Vitals and nursing note reviewed.  Constitutional:      General: He is not in acute distress.    Appearance: Normal appearance. He is normal  weight. He is not ill-appearing, toxic-appearing or diaphoretic.  HENT:     Head: Normocephalic and atraumatic.     Right Ear: Tympanic membrane, ear canal and external ear normal.     Left Ear: Tympanic membrane, ear canal and external ear normal.  Eyes:     General: No scleral icterus.       Right eye: No discharge.        Left eye: No discharge.     Conjunctiva/sclera: Conjunctivae normal.  Cardiovascular:     Rate and Rhythm: Normal rate and regular rhythm.  Pulmonary:     Effort: Pulmonary effort is normal.     Breath sounds: Normal breath sounds.  Musculoskeletal:     Cervical back: Rigidity present. No tenderness.  Lymphadenopathy:     Cervical: No cervical adenopathy.  Skin:    General: Skin is warm and dry.  Neurological:     General: No focal deficit present.     Mental Status: He is alert and oriented to person, place, and time.  Psychiatric:        Mood and Affect: Mood normal.        Behavior: Behavior normal.     BP 132/70   Pulse 67   Temp 97.8 F (36.6 C) (Temporal)   Ht 5\' 7"  (1.702 m)   Wt 175 lb 3.2 oz (79.5 kg)   SpO2 97%   BMI 27.44 kg/m  Wt Readings from Last 3 Encounters:  05/23/20 175 lb 3.2 oz (79.5 kg)  04/05/20 180 lb (81.6 kg)  03/10/20 176 lb 6.4 oz (80 kg)     Health Maintenance Due  Topic Date Due  . INFLUENZA VACCINE  05/01/2020    There are no preventive care reminders to display for this patient.  Lab Results  Component Value  Date   TSH 2.45 12/08/2019   Lab Results  Component Value Date   WBC 8.9 12/08/2019   HGB 13.7 12/08/2019   HCT 40.5 12/08/2019   MCV 89.0 12/08/2019   PLT 173.0 12/08/2019   Lab Results  Component Value Date   NA 141 12/08/2019   K 5.0 12/08/2019   CO2 29 12/08/2019   GLUCOSE 97 12/08/2019   BUN 21 12/08/2019   CREATININE 1.58 (H) 12/08/2019   BILITOT 0.6 12/08/2019   ALKPHOS 110 12/08/2019   AST 19 12/08/2019   ALT 21 12/08/2019   PROT 6.5 12/08/2019   ALBUMIN 3.9 12/08/2019   CALCIUM 9.0 12/08/2019   ANIONGAP 11 06/09/2019   GFR 42.93 (L) 12/08/2019   Lab Results  Component Value Date   CHOL 109 12/08/2019   Lab Results  Component Value Date   HDL 29.00 (L) 12/08/2019   Lab Results  Component Value Date   LDLCALC 54 12/08/2019   Lab Results  Component Value Date   TRIG 133.0 12/08/2019   Lab Results  Component Value Date   CHOLHDL 4 12/08/2019   Lab Results  Component Value Date   HGBA1C 6.8 (H) 12/08/2019      Assessment & Plan:   Problem List Items Addressed This Visit      Cardiovascular and Mediastinum   Essential hypertension - Primary   Relevant Orders   Basic metabolic panel   Urinalysis, Routine w reflex microscopic     Genitourinary   CKD (chronic kidney disease) stage 3, GFR 30-59 ml/min   Relevant Orders   Vitamin B12     Other   B12 deficiency   Relevant Orders  CBC   Iron deficiency   Relevant Orders   CBC   Iron, TIBC and Ferritin Panel   Depression, major, single episode, moderate (HCC)      No orders of the defined types were placed in this encounter.   Follow-up: No follow-ups on file.   Patient will give Cymbalta another try.  If he does not tolerate it again we will try different antidepressant.  Continue current medications for hypertension.  Make adjustments with B12 and iron pending today's labs results.  May be able to discontinue iron. Mliss Sax, MD

## 2020-05-24 LAB — CBC
HCT: 43.5 % (ref 39.0–52.0)
Hemoglobin: 14.4 g/dL (ref 13.0–17.0)
MCHC: 33.2 g/dL (ref 30.0–36.0)
MCV: 91.6 fl (ref 78.0–100.0)
Platelets: 159 10*3/uL (ref 150.0–400.0)
RBC: 4.75 Mil/uL (ref 4.22–5.81)
RDW: 13.5 % (ref 11.5–15.5)
WBC: 8.6 10*3/uL (ref 4.0–10.5)

## 2020-05-24 LAB — IRON,TIBC AND FERRITIN PANEL
%SAT: 38 % (calc) (ref 20–48)
Ferritin: 118 ng/mL (ref 24–380)
Iron: 126 ug/dL (ref 50–180)
TIBC: 328 mcg/dL (calc) (ref 250–425)

## 2020-05-24 LAB — URINALYSIS, ROUTINE W REFLEX MICROSCOPIC
Bilirubin Urine: NEGATIVE
Hgb urine dipstick: NEGATIVE
Ketones, ur: NEGATIVE
Leukocytes,Ua: NEGATIVE
Nitrite: NEGATIVE
RBC / HPF: NONE SEEN (ref 0–?)
Specific Gravity, Urine: 1.02 (ref 1.000–1.030)
Total Protein, Urine: NEGATIVE
Urine Glucose: NEGATIVE
Urobilinogen, UA: 0.2 (ref 0.0–1.0)
pH: 6 (ref 5.0–8.0)

## 2020-05-24 LAB — BASIC METABOLIC PANEL
BUN: 26 mg/dL — ABNORMAL HIGH (ref 6–23)
CO2: 25 mEq/L (ref 19–32)
Calcium: 9.1 mg/dL (ref 8.4–10.5)
Chloride: 106 mEq/L (ref 96–112)
Creatinine, Ser: 1.71 mg/dL — ABNORMAL HIGH (ref 0.40–1.50)
GFR: 39.14 mL/min — ABNORMAL LOW (ref >60.00)
Glucose, Bld: 127 mg/dL — ABNORMAL HIGH (ref 70–99)
Potassium: 4.8 mEq/L (ref 3.5–5.1)
Sodium: 139 mEq/L (ref 135–145)

## 2020-05-24 LAB — VITAMIN B12: Vitamin B-12: 868 pg/mL (ref 211–911)

## 2020-06-09 ENCOUNTER — Ambulatory Visit: Payer: Medicare Other

## 2020-06-16 ENCOUNTER — Ambulatory Visit: Payer: Medicare Other

## 2020-06-22 ENCOUNTER — Ambulatory Visit: Payer: Self-pay | Admitting: Surgery

## 2020-06-23 ENCOUNTER — Telehealth: Payer: Self-pay | Admitting: *Deleted

## 2020-06-23 NOTE — Telephone Encounter (Signed)
Called Melida Quitter (girlfriend) (DPR) and scheduled appt 07/19/2020 @ 8:15 AM w/Michele Geni Bers, PA-C. She will CB if she needs to reschedule this appointment

## 2020-06-23 NOTE — Telephone Encounter (Signed)
Primary Cardiologist:Traci Turner, MD  Chart reviewed as part of pre-operative protocol coverage. Because of Ian Ramos's past medical history and time since last visit, he/she will require a follow-up visit in order to better assess preoperative cardiovascular risk.  Pre-op covering staff: - Please schedule appointment and call patient to inform them. - Please contact requesting surgeon's office via preferred method (i.e, phone, fax) to inform them of need for appointment prior to surgery.  If applicable, this message will also be routed to pharmacy pool and/or primary cardiologist for input on holding anticoagulant/antiplatelet agent as requested below so that this information is available at time of patient's appointment.   Ronney Asters, NP  06/23/2020, 10:59 AM

## 2020-06-23 NOTE — Telephone Encounter (Signed)
° °  [facility] Pre-operative Risk Assessment    HEARTCARE STAFF: - Please ensure there is not already an duplicate clearance open for this procedure. - Under Visit Info/Reason for Call, type in Other and utilize the format Clearance MM/DD/YY or Clearance TBD. Do not use dashes or single digits. - If request is for dental extraction, please clarify the # of teeth to be extracted.  Request for surgical clearance:  1. What type of surgery is being performed? EXCISION OF SKIN LESION ON BACK   2. When is this surgery scheduled? TBD  3. What type of clearance is required (medical clearance vs. Pharmacy clearance to hold med vs. Both)? MEDICAL  4. Are there any medications that need to be held prior to surgery and how long? PLAVIX   5. Practice name and name of physician performing surgery? [facility]; DR. Wilburn Cornelia ALLEN   6. What is the office phone number? [phone]   7.   What is the office fax number? [phone] ATTN: JACQUELINE HAGGETT, RMA  8.   Anesthesia type (None, local, MAC, general) ? CHOICE   Julaine Hua 06/23/2020, 10:16 AM  _________________________________________________________________   (provider comments below)

## 2020-06-24 NOTE — Telephone Encounter (Signed)
I will forward clearance notes to [facility] for upcoming appt. 07/19/20. I will remove from the pre op call back pool.

## 2020-07-06 ENCOUNTER — Other Ambulatory Visit: Payer: Self-pay

## 2020-07-07 ENCOUNTER — Ambulatory Visit (INDEPENDENT_AMBULATORY_CARE_PROVIDER_SITE_OTHER): Payer: Medicare Other | Admitting: Family Medicine

## 2020-07-07 ENCOUNTER — Encounter: Payer: Self-pay | Admitting: Family Medicine

## 2020-07-07 ENCOUNTER — Telehealth: Payer: Self-pay

## 2020-07-07 VITALS — BP 118/64 | HR 67 | Temp 98.0°F | Ht 67.0 in | Wt 178.2 lb

## 2020-07-07 DIAGNOSIS — Z23 Encounter for immunization: Secondary | ICD-10-CM

## 2020-07-07 DIAGNOSIS — R5382 Chronic fatigue, unspecified: Secondary | ICD-10-CM

## 2020-07-07 DIAGNOSIS — R7309 Other abnormal glucose: Secondary | ICD-10-CM

## 2020-07-07 DIAGNOSIS — G959 Disease of spinal cord, unspecified: Secondary | ICD-10-CM | POA: Diagnosis not present

## 2020-07-07 DIAGNOSIS — F341 Dysthymic disorder: Secondary | ICD-10-CM | POA: Diagnosis not present

## 2020-07-07 MED ORDER — FLUOXETINE HCL 10 MG PO CAPS: ORAL_CAPSULE | ORAL | 3 refills | Status: DC

## 2020-07-07 MED ORDER — HYDROCODONE-ACETAMINOPHEN 10-325 MG PO TABS: ORAL_TABLET | ORAL | 0 refills | Status: DC

## 2020-07-07 NOTE — Telephone Encounter (Signed)
PA for Hydrocodone/Acet 10/325 mg submitted through cover my meds. Awaiting response.   Key: [id number]/[id number]

## 2020-07-07 NOTE — Progress Notes (Signed)
Established Patient Office Visit  Subjective:  Patient ID: Ian Ramos, male    DOB: [birth year]  Age: 74 y.o. MRN: [id number]  CC:  Chief Complaint  Patient presents with  . Follow-up    3 month follow up,     HPI Ian Ramos presents for follow-up of ongoing fatigue, dysthymia, chronic neck pain.  Had tried Cymbalta but he did not tolerate it.  Neck hurts all the time.  Continues to see neurosurgery.  Neck brace is been recommended that is difficult for him to negotiate.  He is taking no pain medications for his neck.  Complains of ongoing fatigue.  Denies pain other than his neck.  Eating and sleeping normally.  Denies headaches, difficulty chewing or swallowing his food.  He is moving his bowels normally.  No difficulty with urination.  Up-to-date on health maintenance.  Colonoscopy has been postponed until next year.  Recent lab work has been okay.  Glucose has been elevated with elevated hemoglobin A1c.  Intentional weight loss for this issue.  Here today with his significant other Ian Ramos.   Past Medical History:  Diagnosis Date  . Arthritis   . MI (myocardial infarction) (HCC)   . Neuromuscular disorder (HCC)   . Stroke [facility])     Past Surgical History:  Procedure Laterality Date  . CAROTID PTA/STENT INTERVENTION N/A 06/27/2018   Procedure: CAROTID PTA/STENT INTERVENTION;  Surgeon: Sherren Kerns, MD;  Location: [facility] INVASIVE CV LAB;  Service: Cardiovascular;  Laterality: N/A;  . LEFT HEART CATH AND CORONARY ANGIOGRAPHY N/A 06/05/2018   Procedure: LEFT HEART CATH AND CORONARY ANGIOGRAPHY;  Surgeon: Swaziland, Peter M, MD;  Location: [facility] INVASIVE CV LAB;  Service: Cardiovascular;  Laterality: N/A;  . POSTERIOR CERVICAL FUSION/FORAMINOTOMY N/A 05/23/2018   Procedure: Posterior Cervical Fusion with lateral mass fixation - C1 - C6 with laminectomy;  Surgeon: Julio Sicks, MD;  Location: [facility] OR;  Service: Neurosurgery;  Laterality: N/A;    Family History  Problem Relation Age  of Onset  . Hypertension Other   . Heart failure Mother   . CAD Father        s/p CABG in his 37s  . Heart failure Father     Social History   Socioeconomic History  . Marital status: Divorced    Spouse name: Not on file  . Number of children: Not on file  . Years of education: Not on file  . Highest education level: Not on file  Occupational History  . Not on file  Tobacco Use  . Smoking status: Never Smoker  . Smokeless tobacco: Current User    Types: Chew  Vaping Use  . Vaping Use: Never used  Substance and Sexual Activity  . Alcohol use: No  . Drug use: No  . Sexual activity: Not on file  Other Topics Concern  . Not on file  Social History Narrative   NA   Social Determinants of Health   Financial Resource Strain: Low Risk   . Difficulty of Paying Living Expenses: Not hard at all  Food Insecurity:   . Worried About Programme researcher, broadcasting/film/video in the Last Year: Not on file  . Ran Out of Food in the Last Year: Not on file  Transportation Needs: No Transportation Needs  . Lack of Transportation (Medical): No  . Lack of Transportation (Non-Medical): No  Physical Activity:   . Days of Exercise per Week: Not on file  . Minutes of Exercise per Session: Not on  file  Stress:   . Feeling of Stress : Not on file  Social Connections:   . Frequency of Communication with Friends and Family: Not on file  . Frequency of Social Gatherings with Friends and Family: Not on file  . Attends Religious Services: Not on file  . Active Member of Clubs or Organizations: Not on file  . Attends Banker Meetings: Not on file  . Marital Status: Not on file  Intimate Partner Violence:   . Fear of Current or Ex-Partner: Not on file  . Emotionally Abused: Not on file  . Physically Abused: Not on file  . Sexually Abused: Not on file    Outpatient Medications Prior to Visit  Medication Sig Dispense Refill  . acetaminophen (TYLENOL) 325 MG tablet Take 2 tablets (650 mg total) by  mouth every 4 (four) hours as needed for headache or mild pain.    Marland Kitchen aspirin EC 81 MG tablet Take 81 mg by mouth daily.    Marland Kitchen atorvastatin (LIPITOR) 80 MG tablet TAKE 1 TABLET(80 MG) BY MOUTH DAILY 90 tablet 2  . B Complex Vitamins (B COMPLEX-B12) TABS Take one tablet daily 90 tablet 1  . carvedilol (COREG) 3.125 MG tablet TAKE 1 TABLET(3.125 MG) BY MOUTH TWICE DAILY WITH A MEAL 180 tablet 0  . clopidogrel (PLAVIX) 75 MG tablet Take 75 mg by mouth daily.    . FEROSUL 325 (65 Fe) MG tablet TAKE 1 TABLET(325 MG) BY MOUTH DAILY WITH BREAKFAST 90 tablet 1  . sildenafil (REVATIO) 20 MG tablet May take 3-5 pills daily 45 minutes prior... 25 tablet 2  . HYDROcodone-acetaminophen (NORCO) 5-325 MG tablet Take 1 tablet by mouth every 6 (six) hours as needed for moderate pain. (Patient not taking: Reported on 04/05/2020) 60 tablet 0  . methocarbamol (ROBAXIN) 500 MG tablet Take 1 tablet (500 mg total) by mouth every 8 (eight) hours as needed for muscle spasms. (Patient not taking: Reported on 04/05/2020) 30 tablet 0  . pantoprazole (PROTONIX) 40 MG tablet Take 1 tablet (40 mg total) by mouth daily. (Patient not taking: Reported on 04/05/2020) 90 tablet 2   Facility-Administered Medications Prior to Visit  Medication Dose Route Frequency Provider Last Rate Last Admin  . etomidate (AMIDATE) injection    Anesthesia Intra-op Lucinda Dell, CRNA   12 mg at 06/09/18 1445  . succinylcholine (ANECTINE) injection    Anesthesia Intra-op Lucinda Dell, CRNA   100 mg at 06/09/18 1445    Allergies  Allergen Reactions  . Cymbalta [Duloxetine Hcl] Nausea And Vomiting    ROS Review of Systems  Constitutional: Positive for fatigue.  HENT: Negative.   Eyes: Negative for photophobia and visual disturbance.  Respiratory: Negative.   Cardiovascular: Negative.   Gastrointestinal: Negative.  Negative for anal bleeding, blood in stool and constipation.  Endocrine: Negative for polyphagia and polyuria.    Genitourinary: Negative.  Negative for difficulty urinating, frequency, hematuria and urgency.  Musculoskeletal: Positive for neck pain and neck stiffness.  Allergic/Immunologic: Negative for immunocompromised state.  Neurological: Positive for weakness and numbness. Negative for headaches.  Hematological: Does not bruise/bleed easily.  Psychiatric/Behavioral: Positive for dysphoric mood.   Depression screen [facility] 2/9 07/07/2020 05/23/2020 04/05/2020  Decreased Interest 1 3 1   Down, Depressed, Hopeless 2 3 0  PHQ - 2 Score 3 6 1   Altered sleeping 0 0 0  Tired, decreased energy 3 3 2   Change in appetite 0 0 0  Feeling bad or failure about yourself  1 0 0  Trouble concentrating 0 0 0  Moving slowly or fidgety/restless 0 0 0  Suicidal thoughts 0 0 0  PHQ-9 Score 7 9 3   Difficult doing work/chores Somewhat difficult Not difficult at all Not difficult at all      Objective:    Physical Exam Vitals and nursing note reviewed.  Constitutional:      General: He is not in acute distress.    Appearance: Normal appearance. He is normal weight. He is not ill-appearing, toxic-appearing or diaphoretic.  HENT:     Head: Normocephalic and atraumatic.     Right Ear: External ear normal.     Left Ear: External ear normal.  Eyes:     General: No scleral icterus.       Right eye: No discharge.        Left eye: No discharge.     Conjunctiva/sclera: Conjunctivae normal.  Cardiovascular:     Rate and Rhythm: Normal rate and regular rhythm.  Pulmonary:     Effort: Pulmonary effort is normal.     Breath sounds: Normal breath sounds.  Musculoskeletal:     Cervical back: No spasms. Decreased range of motion.     Right lower leg: No edema.     Left lower leg: No edema.  Lymphadenopathy:     Cervical: No cervical adenopathy.  Skin:    General: Skin is warm and dry.  Neurological:     Mental Status: He is alert and oriented to person, place, and time.  Psychiatric:        Mood and Affect: Mood  normal.     BP 118/64   Pulse 67   Temp 98 F (36.7 C) (Tympanic)   Ht 5\' 7"  (1.702 m)   Wt 178 lb 3.2 oz (80.8 kg)   SpO2 96%   BMI 27.91 kg/m  Wt Readings from Last 3 Encounters:  07/07/20 178 lb 3.2 oz (80.8 kg)  05/23/20 175 lb 3.2 oz (79.5 kg)  04/05/20 180 lb (81.6 kg)     Health Maintenance Due  Topic Date Due  . INFLUENZA VACCINE  05/01/2020    There are no preventive care reminders to display for this patient.  Lab Results  Component Value Date   TSH 2.45 12/08/2019   Lab Results  Component Value Date   WBC 8.6 05/23/2020   HGB 14.4 05/23/2020   HCT 43.5 05/23/2020   MCV 91.6 05/23/2020   PLT 159.0 05/23/2020   Lab Results  Component Value Date   NA 139 05/23/2020   K 4.8 05/23/2020   CO2 25 05/23/2020   GLUCOSE 127 (H) 05/23/2020   BUN 26 (H) 05/23/2020   CREATININE 1.71 (H) 05/23/2020   BILITOT 0.6 12/08/2019   ALKPHOS 110 12/08/2019   AST 19 12/08/2019   ALT 21 12/08/2019   PROT 6.5 12/08/2019   ALBUMIN 3.9 12/08/2019   CALCIUM 9.1 05/23/2020   ANIONGAP 11 06/09/2019   GFR 39.14 (L) 05/23/2020   Lab Results  Component Value Date   CHOL 109 12/08/2019   Lab Results  Component Value Date   HDL 29.00 (L) 12/08/2019   Lab Results  Component Value Date   LDLCALC 54 12/08/2019   Lab Results  Component Value Date   TRIG 133.0 12/08/2019   Lab Results  Component Value Date   CHOLHDL 4 12/08/2019   Lab Results  Component Value Date   HGBA1C 6.8 (H) 12/08/2019      Assessment & Plan:  Problem List Items Addressed This Visit      Nervous and Auditory   Cervical myelopathy (HCC)   Relevant Medications   HYDROcodone-acetaminophen (NORCO) 10-325 MG tablet     Other   Chronic fatigue - Primary   Relevant Medications   FLUoxetine (PROZAC) 10 MG capsule   Elevated glucose   Relevant Orders   Basic metabolic panel   Hemoglobin A1c   Dysthymia   Relevant Medications   FLUoxetine (PROZAC) 10 MG capsule      Meds  ordered this encounter  Medications  . FLUoxetine (PROZAC) 10 MG capsule    Sig: Take one daily for one week and then increase to 2 daily.    Dispense:  60 capsule    Refill:  3  . HYDROcodone-acetaminophen (NORCO) 10-325 MG tablet    Sig: Take 1/2 to 1 twice daily as needed for neck pain.    Dispense:  60 tablet    Refill:  0    Follow-up: Return in about 4 weeks (around 08/04/2020).  Agrees to give Prozac a try to help with fatigue.  Will treat diabetes if indicated with today's labs.  Norco to be used as needed for neck pain. Mliss Sax, MD

## 2020-07-08 ENCOUNTER — Telehealth: Payer: Self-pay

## 2020-07-08 NOTE — Telephone Encounter (Signed)
PAfor Hydrocodone/Acet 10/325 approved from 07/07/20 - 107/22. Pharmacy notified VIA phone.    (Newest Message First)    07/07/20 5:01 PM Note   PA for Hydrocodone/Acet 10/325 mg submitted through cover my meds. Awaiting response.   Key: [id number] Dm/cma     Additional Documentation  Encounter Info:  Billing Info,  History,  Allergies,  Detailed Report    Orders Placed   None Medication Renewals and Changes    None   Medication List  Visit Diagnoses    None   Problem List

## 2020-07-19 ENCOUNTER — Ambulatory Visit: Payer: Medicare Other | Admitting: Physician Assistant

## 2020-07-20 ENCOUNTER — Other Ambulatory Visit: Payer: Self-pay | Admitting: Family Medicine

## 2020-07-20 DIAGNOSIS — I2510 Atherosclerotic heart disease of native coronary artery without angina pectoris: Secondary | ICD-10-CM

## 2020-07-20 DIAGNOSIS — I10 Essential (primary) hypertension: Secondary | ICD-10-CM

## 2020-07-22 ENCOUNTER — Other Ambulatory Visit: Payer: Self-pay

## 2020-07-22 ENCOUNTER — Encounter: Payer: Self-pay | Admitting: Physician Assistant

## 2020-07-22 ENCOUNTER — Ambulatory Visit: Payer: Medicare Other | Admitting: Physician Assistant

## 2020-07-22 VITALS — BP 132/64 | HR 54 | Ht 67.0 in | Wt 178.2 lb

## 2020-07-22 DIAGNOSIS — I6523 Occlusion and stenosis of bilateral carotid arteries: Secondary | ICD-10-CM

## 2020-07-22 DIAGNOSIS — Z01818 Encounter for other preprocedural examination: Secondary | ICD-10-CM | POA: Diagnosis not present

## 2020-07-22 DIAGNOSIS — E782 Mixed hyperlipidemia: Secondary | ICD-10-CM

## 2020-07-22 DIAGNOSIS — I2510 Atherosclerotic heart disease of native coronary artery without angina pectoris: Secondary | ICD-10-CM | POA: Diagnosis not present

## 2020-07-22 NOTE — Patient Instructions (Signed)
Medication Instructions:  Your physician recommends that you continue on your current medications as directed. Please refer to the Current Medication list given to you today.  *If you need a refill on your cardiac medications before your next appointment, please call your pharmacy*   Lab Work: None ordered  If you have labs (blood work) drawn today and your tests are completely normal, you will receive your results only by: Marland Kitchen MyChart Message (if you have MyChart) OR . A paper copy in the mail If you have any lab test that is abnormal or we need to change your treatment, we will call you to review the results.   Testing/Procedures: None ordered   Follow-Up: At [facility], you and your health needs are our priority.  As part of our continuing mission to provide you with exceptional heart care, we have created designated Provider Care Teams.  These Care Teams include your primary Cardiologist (physician) and Advanced Practice Providers (APPs -  Physician Assistants and Nurse Practitioners) who all work together to provide you with the care you need, when you need it.  We recommend signing up for the patient portal called "MyChart".  Sign up information is provided on this After Visit Summary.  MyChart is used to connect with patients for Virtual Visits (Telemedicine).  Patients are able to view lab/test results, encounter notes, upcoming appointments, etc.  Non-urgent messages can be sent to your provider as well.   To learn more about what you can do with MyChart, go to [email or web address].    Your next appointment:   12 month(s)  The format for your next appointment:   In Person  Provider:   You may see Armanda Magic, MD or one of the following Advanced Practice Providers on your designated Care Team:    Ronie Spies, PA-C  Jacolyn Reedy, PA-C    Other Instructions  Dr. Nelson Chimes # [phone]

## 2020-07-22 NOTE — Progress Notes (Signed)
Cardiology Office Note:    Date:  07/22/2020   ID:  Ian Ramos, DOB 31-Jan-1946, MRN [id number]  PCP:  Mliss Sax, MD  [facility] Cardiologist:  Armanda Magic, MD  [facility] Electrophysiologist:  None   Chief Complaint: surgical clearance for EXCISION OF SKIN LESION ON BACK   History of Present Illness:    Ian Ramos is a 74 y.o. male with a hx of Non obstructive CAD, Carotid artery disease s/p R carotid stent by Dr. Darrick Penna 06/2018, HLD, CVA, cervical myelopathy and CKD III seen for surgical clearance.   Hx of cervical disc disease who underwent cervical laminectomy on 05/23/2018  On 05/29/18 he presented to ER with chest pain and LBBB with  EF 35% .  Cardiac catheterization revealed moderate CAD >> recommended medical therapy. Hospital course also complicated by stroke in the setting of high-grade ICA stenoses, hypoxic respiratory failure requiring mechanical ventilation.  He recovered and had Rt carotid stent 06/27/18.   He has total occl of Lt carotid artery.    No LBBB when seen by Nada Boozer 06/04/2019 for surgical clearance. Cleared without testing.   Patient is here for surgical clearance with significant other.  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  He denies chest pain, shortness of breath, orthopnea, PND, syncope, lower extremity edema or melena.  Past Medical History:  Diagnosis Date  . Arthritis   . CAD in native artery    a. cath 06/2018 - Eccentric 75% proximal LAD; 75% OM2; occluded RV with collaterals  . CKD (chronic kidney disease), stage III (HCC)   . MI (myocardial infarction) (HCC)   . Neuromuscular disorder (HCC)   . Stroke [facility])     Past Surgical History:  Procedure Laterality Date  . CAROTID PTA/STENT INTERVENTION N/A 06/27/2018   Procedure: CAROTID PTA/STENT INTERVENTION;  Surgeon: Sherren Kerns, MD;  Location: [facility] INVASIVE CV LAB;  Service: Cardiovascular;  Laterality: N/A;  . LEFT HEART CATH AND CORONARY  ANGIOGRAPHY N/A 06/05/2018   Procedure: LEFT HEART CATH AND CORONARY ANGIOGRAPHY;  Surgeon: Swazil[place], Peter M, MD;  Location: [facility] INVASIVE CV LAB;  Service: Cardiovascular;  Laterality: N/A;  . POSTERIOR CERVICAL FUSION/FORAMINOTOMY N/A 05/23/2018   Procedure: Posterior Cervical Fusion with lateral mass fixation - C1 - C6 with laminectomy;  Surgeon: Julio Sicks, MD;  Location: [facility] OR;  Service: Neurosurgery;  Laterality: N/A;    Current Medications: Current Meds  Medication Sig  . acetaminophen (TYLENOL) 325 MG tablet Take 2 tablets (650 mg total) by mouth every 4 (four) hours as needed for headache or mild pain.  Marland Kitchen aspirin EC 81 MG tablet Take 81 mg by mouth daily.  Marland Kitchen atorvastatin (LIPITOR) 80 MG tablet TAKE 1 TABLET(80 MG) BY MOUTH DAILY  . B Complex Vitamins (B COMPLEX-B12) TABS Take one tablet daily  . carvedilol (COREG) 3.125 MG tablet TAKE 1 TABLET(3.125 MG) BY MOUTH TWICE DAILY WITH A MEAL  . clopidogrel (PLAVIX) 75 MG tablet Take 75 mg by mouth daily.  . FEROSUL 325 (65 Fe) MG tablet TAKE 1 TABLET(325 MG) BY MOUTH DAILY WITH BREAKFAST  . FLUoxetine (PROZAC) 10 MG capsule Take one daily for one week and then increase to 2 daily.  Marland Kitchen HYDROcodone-acetaminophen (NORCO) 10-325 MG tablet Take 1/2 to 1 twice daily as needed for neck pain.  . sildenafil (REVATIO) 20 MG tablet May take 3-5 pills daily 45 minutes prior...     Allergies:   Cymbalta [duloxetine hcl]   Social  History   Socioeconomic History  . Marital status: Divorced    Spouse name: Not on file  . Number of children: Not on file  . Years of education: Not on file  . Highest education level: Not on file  Occupational History  . Not on file  Tobacco Use  . Smoking status: Never Smoker  . Smokeless tobacco: Current User    Types: Chew  Vaping Use  . Vaping Use: Never used  Substance and Sexual Activity  . Alcohol use: No  . Drug use: No  . Sexual activity: Not on file  Other Topics Concern  . Not on file  Social History  Narrative   NA   Social Determinants of Health   Financial Resource Strain: Low Risk   . Difficulty of Paying Living Expenses: Not hard at all  Food Insecurity:   . Worried About Programme researcher, broadcasting/film/video in the Last Year: Not on file  . Ran Out of Food in the Last Year: Not on file  Transportation Needs: No Transportation Needs  . Lack of Transportation (Medical): No  . Lack of Transportation (Non-Medical): No  Physical Activity:   . Days of Exercise per Week: Not on file  . Minutes of Exercise per Session: Not on file  Stress:   . Feeling of Stress : Not on file  Social Connections:   . Frequency of Communication with Friends and Family: Not on file  . Frequency of Social Gatherings with Friends and Family: Not on file  . Attends Religious Services: Not on file  . Active Member of Clubs or Organizations: Not on file  . Attends Banker Meetings: Not on file  . Marital Status: Not on file     Family History: The patient's family history includes CAD in his father; Heart failure in his father and mother; Hypertension in an other family member.    ROS:   Please see the history of present illness.    All other systems reviewed and are negative.   EKGs/Labs/Other Studies Reviewed:    The following studies were reviewed today:  Echo 08/2018 1. Left ventricular ejection fraction, by visual estimation, is 60 to  65%. The left ventricle has normal function. Left ventricular septal wall  thickness was mildly increased. Mildly increased left ventricular  posterior wall thickness.  2. Left ventricular diastolic parameters are consistent with Grade I  diastolic dysfunction (impaired relaxation).  3. Global right ventricle has normal systolic function.The right  ventricular size is normal. No increase in right ventricular wall  thickness.  4. Left atrial size was normal.  5. Right atrial size was normal.  6. The mitral valve is normal in structure. Trace mitral valve    regurgitation. No evidence of mitral stenosis.  7. The tricuspid valve is normal in structure. Tricuspid valve  regurgitation is trivial.  8. The aortic valve was not well visualized. There is Severely thickening  of the aortic valve. There is Severe calcifcation of the aortic valve.  Aortic valve regurgitation is trivial. No evidence of aortic valve  sclerosis or stenosis.  9. The pulmonic valve was normal in structure. Pulmonic valve  regurgitation is not visualized.  10. Normal pulmonary artery systolic pressure.  11. The inferior vena cava is normal in size with greater than 50%  respiratory variability, suggesting right atrial pressure of 3 mmHg  LEFT HEART CATH AND CORONARY ANGIOGRAPHY 06/2018  Conclusion    Prox RCA to Mid RCA lesion is 30% stenosed.  Acute Mrg lesion is 100% stenosed.  Prox LAD lesion is 75% stenosed.  Ost 2nd Mrg to 2nd Mrg lesion is 75% stenosed.  LV end diastolic pressure is normal.    1. Moderate obstructive CAD    - Eccentric 75% proximal LAD    - 75% OM2    - Occluded RV marginal branch with left to right collaterals. 2. Normal LVEDP  Plan: Patient does not have critical CAD. I would recommend medical management. If he has refractory angina despite optimal medical therapy would consider PCI of LAD. The OM lesion would be more difficult to treat since it involves bifurcation of a large OM1.   Recommend Aspirin 81mg  daily for moderate CAD.  Carotid artery disease 08/2018 Summary:  Right Carotid: Velocities in the right ICA are consistent with a 60-79%         stenosis. The ECA appears >50% stenosed. Velocities suggest         50-75% ICA stent stenosis. Velocities may be elevated  secondary         to contralateral occlusion.   Left Carotid: Velocities in the left ICA are consistent with a total  occlusion.        The ECA appears >50% stenosed.    EKG:  EKG is  ordered today.  The ekg ordered today  demonstrates sinus bradycardia at 54 bpm  Recent Labs: 12/08/2019: ALT 21; TSH 2.45 05/23/2020: BUN 26; Creatinine, Ser 1.71; Hemoglobin 14.4; Platelets 159.0; Potassium 4.8; Sodium 139  Recent Lipid Panel    Component Value Date/Time   CHOL 109 12/08/2019 1457   TRIG 133.0 12/08/2019 1457   HDL 29.00 (L) 12/08/2019 1457   CHOLHDL 4 12/08/2019 1457   VLDL 26.6 12/08/2019 1457   LDLCALC 54 12/08/2019 1457   LDLDIRECT 59.0 12/08/2019 1457      Physical Exam:    VS:  BP 132/64   Pulse (!) 54   Ht 5\' 7"  (1.702 m)   Wt 178 lb 3.2 oz (80.8 kg)   SpO2 96%   BMI 27.91 kg/m     Wt Readings from Last 3 Encounters:  07/22/20 178 lb 3.2 oz (80.8 kg)  07/07/20 178 lb 3.2 oz (80.8 kg)  05/23/20 175 lb 3.2 oz (79.5 kg)     GEN:  Well nourished, well developed in no acute distress HEENT: Normal NECK: No JVD; No carotid bruits LYMPHATICS: No lymphadenopathy CARDIAC: RRR, no murmurs, rubs, gallops RESPIRATORY:  Clear to auscultation without rales, wheezing or rhonchi  ABDOMEN: Soft, non-tender, non-distended MUSCULOSKELETAL:  No edema; No deformity  SKIN: Warm and dry NEUROLOGIC:  Alert and oriented x 3 PSYCHIATRIC:  Normal affect   ASSESSMENT AND PLAN:    1. Moderate CAD - Cath in 2019 as above. No anginal symptoms. Recommended medical therapy. Continue ASA, statin and BB.   2. Carotid artery disease - Followed by vascular. As below. Dr. 05/25/20 information given.  - Continue statin   3. HLD - 12/08/2019: Cholesterol 109; HDL 29.00; LDL Cholesterol 54; Triglycerides 133.0; VLDL 26.6  - Continue Lipitor 80mg  qd   4. Surgical clearance - His Plavix is managed by Dr. Evelina Dun or PCP. Hx of stent to right carotid artery and 100% occluded left ICA. Hx of stroke. Dr. 02/07/2020 or PCP need to give recommendations above plavix.     Medication Adjustments/Labs and Tests Ordered: Current medicines are reviewed at length with the patient today.  Concerns regarding medicines are outlined  above.  Orders Placed This Encounter  Procedures  . EKG 12-Lead   No orders of the defined types were placed in this encounter.   Patient Instructions  Medication Instructions:  Your physician recommends that you continue on your current medications as directed. Please refer to the Current Medication list given to you today.  *If you need a refill on your cardiac medications before your next appointment, please call your pharmacy*   Lab Work: None ordered  If you have labs (blood work) drawn today and your tests are completely normal, you will receive your results only by: Marland Kitchen MyChart Message (if you have MyChart) OR . A paper copy in the mail If you have any lab test that is abnormal or we need to change your treatment, we will call you to review the results.   Testing/Procedures: None ordered   Follow-Up: At [facility], you and your health needs are our priority.  As part of our continuing mission to provide you with exceptional heart care, we have created designated Provider Care Teams.  These Care Teams include your primary Cardiologist (physician) and Advanced Practice Providers (APPs -  Physician Assistants and Nurse Practitioners) who all work together to provide you with the care you need, when you need it.  We recommend signing up for the patient portal called "MyChart".  Sign up information is provided on this After Visit Summary.  MyChart is used to connect with patients for Virtual Visits (Telemedicine).  Patients are able to view lab/test results, encounter notes, upcoming appointments, etc.  Non-urgent messages can be sent to your provider as well.   To learn more about what you can do with MyChart, go to [email or web address].    Your next appointment:   12 month(s)  The format for your next appointment:   In Person  Provider:   You may see Armanda Magic, MD or one of the following Advanced Practice Providers on your designated Care Team:    Ronie Spies,  PA-C  Jacolyn Reedy, PA-C    Other Instructions  Dr. Nelson Chimes # [phone]     Signed, Manson Passey, PA  07/22/2020 2:16 PM    [facility]

## 2020-08-05 ENCOUNTER — Ambulatory Visit: Payer: Self-pay | Admitting: Surgery

## 2020-08-14 ENCOUNTER — Other Ambulatory Visit: Payer: Self-pay | Admitting: Family Medicine

## 2020-08-14 DIAGNOSIS — I10 Essential (primary) hypertension: Secondary | ICD-10-CM

## 2020-08-14 DIAGNOSIS — I2583 Coronary atherosclerosis due to lipid rich plaque: Secondary | ICD-10-CM

## 2020-08-14 DIAGNOSIS — I2510 Atherosclerotic heart disease of native coronary artery without angina pectoris: Secondary | ICD-10-CM

## 2020-08-16 ENCOUNTER — Other Ambulatory Visit (HOSPITAL_COMMUNITY): Payer: Medicare Other

## 2020-08-18 NOTE — Progress Notes (Signed)
Called pt for pre-op call, his sig other, Kendal Hymen answered and she states that they called Dr. Eliot Ford office on 08/15/20 and told them that pt was not going to have this surgery. I called Dr. Eliot Ford office and spoke with Steward Drone, the Merchant navy officer and gave her this information. She states she will take care of it.

## 2020-08-19 ENCOUNTER — Ambulatory Visit (HOSPITAL_COMMUNITY): Admission: RE | Admit: 2020-08-19 | Payer: Medicare Other | Source: Home / Self Care | Admitting: Surgery

## 2020-08-19 SURGERY — EXCISION, KELOID
Anesthesia: Choice | Laterality: Right

## 2020-10-04 ENCOUNTER — Ambulatory Visit: Payer: Medicare Other | Admitting: Family Medicine

## 2020-10-17 ENCOUNTER — Telehealth: Payer: Self-pay

## 2020-10-17 NOTE — Progress Notes (Signed)
    Chronic Care Management Pharmacy Assistant   Name: Ian Ramos  MRN: [id number] DOB: 09/12/1946  Reason for Encounter: Medication Review/General Adherence Call.   PCP : Mliss Sax, MD  Allergies:   Allergies  Allergen Reactions  . Cymbalta [Duloxetine Hcl] Nausea And Vomiting    Medications: Outpatient Encounter Medications as of 10/17/2020  Medication Sig  . acetaminophen (TYLENOL) 325 MG tablet Take 2 tablets (650 mg total) by mouth every 4 (four) hours as needed for headache or mild pain.  Marland Kitchen aspirin EC 81 MG tablet Take 81 mg by mouth daily.  Marland Kitchen atorvastatin (LIPITOR) 80 MG tablet TAKE 1 TABLET(80 MG) BY MOUTH DAILY  . B Complex Vitamins (B COMPLEX-B12) TABS Take one tablet daily  . carvedilol (COREG) 3.125 MG tablet TAKE 1 TABLET(3.125 MG) BY MOUTH TWICE DAILY WITH A MEAL  . clopidogrel (PLAVIX) 75 MG tablet PLACE 1 TABLET INTO FEEDING TUBE DAILY  . FEROSUL 325 (65 Fe) MG tablet TAKE 1 TABLET(325 MG) BY MOUTH DAILY WITH BREAKFAST  . FLUoxetine (PROZAC) 10 MG capsule Take one daily for one week and then increase to 2 daily.  Marland Kitchen HYDROcodone-acetaminophen (NORCO) 10-325 MG tablet Take 1/2 to 1 twice daily as needed for neck pain.  . sildenafil (REVATIO) 20 MG tablet May take 3-5 pills daily 45 minutes prior...   Facility-Administered Encounter Medications as of 10/17/2020  Medication  . etomidate (AMIDATE) injection  . succinylcholine (ANECTINE) injection    Current Diagnosis: Patient Active Problem List   Diagnosis Date Noted  . Dysthymia 07/07/2020  . Depression, major, single episode, moderate (HCC) 05/23/2020  . Healthcare maintenance 12/08/2019  . Elevated glucose 12/08/2019  . Erectile dysfunction due to arterial insufficiency 12/08/2019  . Acute strain of neck muscle 06/26/2019  . Skin lesion of back 10/06/2018  . B12 deficiency 09/01/2018  . Iron deficiency 09/01/2018  . Chronic fatigue 09/01/2018  . Right-sided extracranial carotid artery  stenosis 06/27/2018  . Right middle cerebral artery stroke (HCC) 06/19/2018  . Spondylosis, cervical, with myelopathy   . Neurologic gait disorder   . Essential hypertension   . On mechanically assisted ventilation (HCC)   . Acute systolic CHF (congestive heart failure) (HCC)   . Coronary artery disease due to lipid rich plaque   . DCM (dilated cardiomyopathy) (HCC)   . CKD (chronic kidney disease) stage 3, GFR 30-59 ml/min (HCC) 05/28/2018  . Esophageal thickening 05/28/2018  . LBBB (left bundle branch block) 05/28/2018  . Anemia 05/28/2018  . Status post cervical spinal fusion 05/23/2018  . Cervical myelopathy (HCC) 05/23/2018  . Left arm weakness 09/20/2017    Goals Addressed   None    I have attempted without success to contact this patient by phone three times to do his General Adherence  call. I left a Voice message for patient to return my call.     Left Voice message on  10/17/2020,10/19/2020,10/21/2020  Follow-Up:  Pharmacist Review   Everlean Cherry Clinical Pharmacist Assistant [phone]

## 2020-10-27 ENCOUNTER — Ambulatory Visit: Payer: Medicare Other | Admitting: Family Medicine

## 2020-10-30 ENCOUNTER — Other Ambulatory Visit: Payer: Self-pay | Admitting: Family Medicine

## 2020-10-30 ENCOUNTER — Other Ambulatory Visit: Payer: Self-pay | Admitting: Family

## 2020-10-30 DIAGNOSIS — D649 Anemia, unspecified: Secondary | ICD-10-CM

## 2020-10-31 ENCOUNTER — Telehealth: Payer: Self-pay

## 2020-10-31 NOTE — Progress Notes (Signed)
    Chronic Care Management Pharmacy Assistant   Name: Ian Ramos  MRN: [id number] DOB: 01-31-1946  Reason for Encounter: Medication Review  PCP : Mliss Sax, MD  Allergies:   Allergies  Allergen Reactions  . Cymbalta [Duloxetine Hcl] Nausea And Vomiting    Medications: Outpatient Encounter Medications as of 10/31/2020  Medication Sig  . acetaminophen (TYLENOL) 325 MG tablet Take 2 tablets (650 mg total) by mouth every 4 (four) hours as needed for headache or mild pain.  Marland Kitchen aspirin EC 81 MG tablet Take 81 mg by mouth daily.  Marland Kitchen atorvastatin (LIPITOR) 80 MG tablet TAKE 1 TABLET(80 MG) BY MOUTH DAILY  . B Complex Vitamins (B COMPLEX-B12) TABS Take one tablet daily  . carvedilol (COREG) 3.125 MG tablet TAKE 1 TABLET(3.125 MG) BY MOUTH TWICE DAILY WITH A MEAL  . clopidogrel (PLAVIX) 75 MG tablet PLACE 1 TABLET INTO FEEDING TUBE DAILY  . FEROSUL 325 (65 Fe) MG tablet TAKE 1 TABLET(325 MG) BY MOUTH DAILY WITH BREAKFAST  . FLUoxetine (PROZAC) 10 MG capsule Take one daily for one week and then increase to 2 daily.  Marland Kitchen HYDROcodone-acetaminophen (NORCO) 10-325 MG tablet Take 1/2 to 1 twice daily as needed for neck pain.  . sildenafil (REVATIO) 20 MG tablet May take 3-5 pills daily 45 minutes prior...   Facility-Administered Encounter Medications as of 10/31/2020  Medication  . etomidate (AMIDATE) injection  . succinylcholine (ANECTINE) injection    Current Diagnosis: Patient Active Problem List   Diagnosis Date Noted  . Dysthymia 07/07/2020  . Depression, major, single episode, moderate (HCC) 05/23/2020  . Healthcare maintenance 12/08/2019  . Elevated glucose 12/08/2019  . Erectile dysfunction due to arterial insufficiency 12/08/2019  . Acute strain of neck muscle 06/26/2019  . Skin lesion of back 10/06/2018  . B12 deficiency 09/01/2018  . Iron deficiency 09/01/2018  . Chronic fatigue 09/01/2018  . Right-sided extracranial carotid artery stenosis 06/27/2018  .  Right middle cerebral artery stroke (HCC) 06/19/2018  . Spondylosis, cervical, with myelopathy   . Neurologic gait disorder   . Essential hypertension   . On mechanically assisted ventilation (HCC)   . Acute systolic CHF (congestive heart failure) (HCC)   . Coronary artery disease due to lipid rich plaque   . DCM (dilated cardiomyopathy) (HCC)   . CKD (chronic kidney disease) stage 3, GFR 30-59 ml/min (HCC) 05/28/2018  . Esophageal thickening 05/28/2018  . LBBB (left bundle branch block) 05/28/2018  . Anemia 05/28/2018  . Status post cervical spinal fusion 05/23/2018  . Cervical myelopathy (HCC) 05/23/2018  . Left arm weakness 09/20/2017    Goals Addressed   None    Performed cost analysis for patient, estimated yearly medication cost around $317.00.   Follow-Up:  Pharmacist Review   Everlean Cherry Clinical Pharmacist Assistant [phone]

## 2020-11-06 ENCOUNTER — Other Ambulatory Visit: Payer: Self-pay | Admitting: Family Medicine

## 2020-11-06 DIAGNOSIS — N5201 Erectile dysfunction due to arterial insufficiency: Secondary | ICD-10-CM

## 2020-11-14 ENCOUNTER — Telehealth: Payer: Self-pay

## 2020-11-14 NOTE — Telephone Encounter (Signed)
Error

## 2020-11-14 NOTE — Progress Notes (Signed)
    Chronic Care Management Pharmacy Assistant   Name: Ian Ramos  MRN: [id number] DOB: 19-Mar-1946  Reason for Encounter: Medication Review /General Adherence Call.    PCP : Mliss Sax, MD  Allergies:   Allergies  Allergen Reactions  . Cymbalta [Duloxetine Hcl] Nausea And Vomiting    Medications: Outpatient Encounter Medications as of 11/14/2020  Medication Sig  . acetaminophen (TYLENOL) 325 MG tablet Take 2 tablets (650 mg total) by mouth every 4 (four) hours as needed for headache or mild pain.  Marland Kitchen aspirin EC 81 MG tablet Take 81 mg by mouth daily.  Marland Kitchen atorvastatin (LIPITOR) 80 MG tablet TAKE 1 TABLET(80 MG) BY MOUTH DAILY  . B Complex Vitamins (B COMPLEX-B12) TABS Take one tablet daily  . carvedilol (COREG) 3.125 MG tablet TAKE 1 TABLET(3.125 MG) BY MOUTH TWICE DAILY WITH A MEAL  . clopidogrel (PLAVIX) 75 MG tablet PLACE 1 TABLET INTO FEEDING TUBE DAILY  . FEROSUL 325 (65 Fe) MG tablet TAKE 1 TABLET(325 MG) BY MOUTH DAILY WITH BREAKFAST  . FLUoxetine (PROZAC) 10 MG capsule Take one daily for one week and then increase to 2 daily.  Marland Kitchen HYDROcodone-acetaminophen (NORCO) 10-325 MG tablet Take 1/2 to 1 twice daily as needed for neck pain.  . sildenafil (REVATIO) 20 MG tablet TAKE 3 TO 5 TABLETS DAILY 45 MINUTES PRIOR   Facility-Administered Encounter Medications as of 11/14/2020  Medication  . etomidate (AMIDATE) injection  . succinylcholine (ANECTINE) injection    Current Diagnosis: Patient Active Problem List   Diagnosis Date Noted  . Dysthymia 07/07/2020  . Depression, major, single episode, moderate (HCC) 05/23/2020  . Healthcare maintenance 12/08/2019  . Elevated glucose 12/08/2019  . Erectile dysfunction due to arterial insufficiency 12/08/2019  . Acute strain of neck muscle 06/26/2019  . Skin lesion of back 10/06/2018  . B12 deficiency 09/01/2018  . Iron deficiency 09/01/2018  . Chronic fatigue 09/01/2018  . Right-sided extracranial carotid artery  stenosis 06/27/2018  . Right middle cerebral artery stroke (HCC) 06/19/2018  . Spondylosis, cervical, with myelopathy   . Neurologic gait disorder   . Essential hypertension   . On mechanically assisted ventilation (HCC)   . Acute systolic CHF (congestive heart failure) (HCC)   . Coronary artery disease due to lipid rich plaque   . DCM (dilated cardiomyopathy) (HCC)   . CKD (chronic kidney disease) stage 3, GFR 30-59 ml/min (HCC) 05/28/2018  . Esophageal thickening 05/28/2018  . LBBB (left bundle branch block) 05/28/2018  . Anemia 05/28/2018  . Status post cervical spinal fusion 05/23/2018  . Cervical myelopathy (HCC) 05/23/2018  . Left arm weakness 09/20/2017    Goals Addressed   None    I have attempted without success to contact this patient by phone three times to do his general adherence  call. I left a Voice message for patient to return my call.    LVM   11/14/2020 ,11/22/2020.11/23/2020  Follow-Up:  Pharmacist Review   Everlean Cherry Clinical Pharmacist Assistant [phone]

## 2021-01-24 ENCOUNTER — Other Ambulatory Visit: Payer: Self-pay | Admitting: Family Medicine

## 2021-01-24 ENCOUNTER — Other Ambulatory Visit: Payer: Self-pay | Admitting: Family

## 2021-01-24 DIAGNOSIS — I2510 Atherosclerotic heart disease of native coronary artery without angina pectoris: Secondary | ICD-10-CM

## 2021-01-24 DIAGNOSIS — I10 Essential (primary) hypertension: Secondary | ICD-10-CM

## 2021-01-24 DIAGNOSIS — I2583 Coronary atherosclerosis due to lipid rich plaque: Secondary | ICD-10-CM

## 2021-01-26 ENCOUNTER — Telehealth: Payer: Self-pay

## 2021-01-26 NOTE — Progress Notes (Signed)
    Chronic Care Management Pharmacy Assistant   Name: Ian Ramos  MRN: [id number] DOB: Jan 27, 1946  Reason for Encounter: Medication Review/General Adherence Call.   Recent office visits:  No recent Office Visit  Recent consult visits:  No recent Consult Visit  Hospital visits:  None in previous 6 months  Medications: Outpatient Encounter Medications as of 01/26/2021  Medication Sig  . acetaminophen (TYLENOL) 325 MG tablet Take 2 tablets (650 mg total) by mouth every 4 (four) hours as needed for headache or mild pain.  Marland Kitchen aspirin EC 81 MG tablet Take 81 mg by mouth daily.  Marland Kitchen atorvastatin (LIPITOR) 80 MG tablet TAKE 1 TABLET(80 MG) BY MOUTH DAILY  . B Complex Vitamins (B COMPLEX-B12) TABS Take one tablet daily  . carvedilol (COREG) 3.125 MG tablet TAKE 1 TABLET(3.125 MG) BY MOUTH TWICE DAILY WITH A MEAL  . clopidogrel (PLAVIX) 75 MG tablet PLACE 1 TABLET INTO FEEDING TUBE DAILY  . FEROSUL 325 (65 Fe) MG tablet TAKE 1 TABLET(325 MG) BY MOUTH DAILY WITH BREAKFAST  . FLUoxetine (PROZAC) 10 MG capsule Take one daily for one week and then increase to 2 daily.  Marland Kitchen HYDROcodone-acetaminophen (NORCO) 10-325 MG tablet Take 1/2 to 1 twice daily as needed for neck pain.  . sildenafil (REVATIO) 20 MG tablet TAKE 3 TO 5 TABLETS DAILY 45 MINUTES PRIOR   Facility-Administered Encounter Medications as of 01/26/2021  Medication  . etomidate (AMIDATE) injection  . succinylcholine (ANECTINE) injection    Star Rating Drugs: Atorvastatin 80 mg last filled on 01/24/2021 for 90 day supply at [facility]        Everlean Cherry Clinical Pharmacist Assistant [phone]   I have attempted without success to contact this patient by phone two times to do his General Adhernece  call. I left a Voice message for patient to return my call.  Left voice message on 04/28,04/29  Everlean Cherry Clinical Pharmacist Assistant [phone]

## 2021-02-01 ENCOUNTER — Other Ambulatory Visit: Payer: Self-pay

## 2021-02-01 DIAGNOSIS — I10 Essential (primary) hypertension: Secondary | ICD-10-CM

## 2021-02-01 DIAGNOSIS — I2510 Atherosclerotic heart disease of native coronary artery without angina pectoris: Secondary | ICD-10-CM

## 2021-02-01 MED ORDER — CLOPIDOGREL BISULFATE 75 MG PO TABS: ORAL_TABLET | ORAL | 0 refills | Status: DC

## 2021-02-01 MED ORDER — CARVEDILOL 3.125 MG PO TABS: ORAL_TABLET | ORAL | 0 refills | Status: DC

## 2021-03-16 ENCOUNTER — Telehealth: Payer: Self-pay | Admitting: Family Medicine

## 2021-03-16 NOTE — Telephone Encounter (Signed)
Left message for patient to call back and schedule Medicare Annual Wellness Visit (AWV).   Please offer to do virtually or by telephone.   Due for AWVI  Please schedule at anytime with Nurse Health Advisor.   

## 2021-04-24 ENCOUNTER — Other Ambulatory Visit: Payer: Self-pay | Admitting: Family Medicine

## 2021-04-24 DIAGNOSIS — I2510 Atherosclerotic heart disease of native coronary artery without angina pectoris: Secondary | ICD-10-CM

## 2021-04-24 DIAGNOSIS — I10 Essential (primary) hypertension: Secondary | ICD-10-CM

## 2021-04-24 NOTE — Telephone Encounter (Signed)
Chart supports Rx 

## 2021-05-23 ENCOUNTER — Ambulatory Visit (INDEPENDENT_AMBULATORY_CARE_PROVIDER_SITE_OTHER): Payer: Medicare Other | Admitting: *Deleted

## 2021-05-23 ENCOUNTER — Ambulatory Visit: Payer: Medicare Other

## 2021-05-23 DIAGNOSIS — Z0000 Encounter for general adult medical examination without abnormal findings: Secondary | ICD-10-CM

## 2021-05-23 NOTE — Progress Notes (Signed)
Subjective:   Ian Ramos is a 75 y.o. male who presents for Medicare Annual/Subsequent preventive examination.  I connected with  Ian Ramos on 05/23/21 by a telephone enabled telemedicine application and verified that I am speaking with the correct person using two identifiers.   I discussed the limitations of evaluation and management by telemedicine. The patient expressed understanding and agreed to proceed.   Review of Systems    NA Cardiac Risk Factors include: advanced age (>37men, >10 women);hypertension;male gender;obesity (BMI >30kg/m2)     Objective:    Today's Vitals   05/23/21 1204  PainSc: 5    There is no height or weight on file to calculate BMI.  Advanced Directives 05/23/2021 06/09/2019 06/28/2018 06/19/2018 05/29/2018 05/26/2018 05/24/2018  Does Patient Have a Medical Advance Directive? Yes No Yes Yes Yes Yes Yes  Type of Camera operator Power of [employer] Power of [employer] Power of [employer] Power of [place];Living will  Does patient want to make changes to medical advance directive? - - No - Patient declined No - Patient declined No - Patient declined - No - Patient declined  Copy of Healthcare Power of Attorney in Chart? Yes - validated most recent copy scanned in chart (See row information) - Yes Yes No - copy requested - -  Would patient like information on creating a medical advance directive? - No - Guardian declined - - - - -    Current Medications (verified) Outpatient Encounter Medications as of 05/23/2021  Medication Sig   acetaminophen (TYLENOL) 325 MG tablet Take 2 tablets (650 mg total) by mouth every 4 (four) hours as needed for headache or mild pain.   aspirin EC 81 MG tablet Take 81 mg by mouth daily.   atorvastatin (LIPITOR) 80 MG tablet TAKE 1 TABLET(80 MG) BY MOUTH DAILY   B Complex Vitamins (B COMPLEX-B12) TABS Take one tablet  daily   carvedilol (COREG) 3.125 MG tablet TAKE 1 TABLET(3.125 MG) BY MOUTH TWICE DAILY WITH A MEAL   clopidogrel (PLAVIX) 75 MG tablet PLACE 1 TABLET INTO FEEDING TUBE DAILY   FEROSUL 325 (65 Fe) MG tablet TAKE 1 TABLET(325 MG) BY MOUTH DAILY WITH BREAKFAST   HYDROcodone-acetaminophen (NORCO) 10-325 MG tablet Take 1/2 to 1 twice daily as needed for neck pain.   sildenafil (REVATIO) 20 MG tablet TAKE 3 TO 5 TABLETS DAILY 45 MINUTES PRIOR   FLUoxetine (PROZAC) 10 MG capsule Take one daily for one week and then increase to 2 daily. (Patient not taking: Reported on 05/23/2021)   Facility-Administered Encounter Medications as of 05/23/2021  Medication   etomidate (AMIDATE) injection   succinylcholine (ANECTINE) injection    Allergies (verified) Cymbalta [duloxetine hcl]   History: Past Medical History:  Diagnosis Date   Arthritis    CAD in native artery    a. cath 06/2018 - Eccentric 75% proximal LAD; 75% OM2; occluded RV with collaterals   CKD (chronic kidney disease), stage III (HCC)    MI (myocardial infarction) (HCC)    Neuromuscular disorder (HCC)    Stroke [facility])    Past Surgical History:  Procedure Laterality Date   CAROTID PTA/STENT INTERVENTION N/A 06/27/2018   Procedure: CAROTID PTA/STENT INTERVENTION;  Surgeon: Sherren Kerns, MD;  Location: [facility] INVASIVE CV LAB;  Service: Cardiovascular;  Laterality: N/A;   LEFT HEART CATH AND CORONARY ANGIOGRAPHY N/A 06/05/2018   Procedure: LEFT HEART CATH AND CORONARY ANGIOGRAPHY;  Surgeon: [place],  Demetria Pore, MD;  Location: [facility] INVASIVE CV LAB;  Service: Cardiovascular;  Laterality: N/A;   POSTERIOR CERVICAL FUSION/FORAMINOTOMY N/A 05/23/2018   Procedure: Posterior Cervical Fusion with lateral mass fixation - C1 - C6 with laminectomy;  Surgeon: Julio Sicks, MD;  Location: [facility] OR;  Service: Neurosurgery;  Laterality: N/A;   Family History  Problem Relation Age of Onset   Hypertension Other    Heart failure Mother    CAD Father        s/p CABG  in his 91s   Heart failure Father    Social History   Socioeconomic History   Marital status: Divorced    Spouse name: Not on file   Number of children: Not on file   Years of education: Not on file   Highest education level: Not on file  Occupational History   Not on file  Tobacco Use   Smoking status: Never   Smokeless tobacco: Current    Types: Chew  Vaping Use   Vaping Use: Never used  Substance and Sexual Activity   Alcohol use: No   Drug use: No   Sexual activity: Not on file  Other Topics Concern   Not on file  Social History Narrative   NA   Social Determinants of Health   Financial Resource Strain: Low Risk    Difficulty of Paying Living Expenses: Not hard at all  Food Insecurity: No Food Insecurity   Worried About Programme researcher, broadcasting/film/video in the Last Year: Never true   Ran Out of Food in the Last Year: Never true  Transportation Needs: No Transportation Needs   Lack of Transportation (Medical): No   Lack of Transportation (Non-Medical): No  Physical Activity: Insufficiently Active   Days of Exercise per Week: 3 days   Minutes of Exercise per Session: 20 min  Stress: No Stress Concern Present   Feeling of Stress : Not at all  Social Connections: Moderately Isolated   Frequency of Communication with Friends and Family: More than three times a week   Frequency of Social Gatherings with Friends and Family: More than three times a week   Attends Religious Services: Never   Database administrator or Organizations: No   Attends Engineer, structural: Never   Marital Status: Married    Tobacco Counseling Ready to quit: Not Answered Counseling given: Not Answered   Clinical Intake:  Pre-visit preparation completed: Yes  Pain : 0-10 Pain Score: 5  Pain Type: Chronic pain Pain Location: Neck Pain Descriptors / Indicators: Constant Pain Onset: More than a month ago Pain Relieving Factors: hydrocodone Effect of Pain on Daily Activities: yes  Pain  Relieving Factors: hydrocodone  Nutritional Risks: None Diabetes: No  How often do you need to have someone help you when you read instructions, pamphlets, or other written materials from your doctor or pharmacy?: 1 - Never  Diabetic?no  Interpreter Needed?: No  Information entered by :: Remi Haggard LPN   Activities of Daily Living In your present state of health, do you have any difficulty performing the following activities: 05/23/2021  Hearing? N  Vision? N  Difficulty concentrating or making decisions? N  Walking or climbing stairs? N  Dressing or bathing? N  Doing errands, shopping? N  Preparing Food and eating ? N  Using the Toilet? N  In the past six months, have you accidently leaked urine? N  Do you have problems with loss of bowel control? N  Managing your Medications? N  Managing your Finances? N  Housekeeping or managing your Housekeeping? N  Some recent data might be hidden    Patient Care Team: Mliss Sax, MD as PCP - General (Family Medicine) Quintella Reichert, MD as PCP - Cardiology (Cardiology) Gaspar Cola, [facility] as Pharmacist (Pharmacist)  Indicate any recent Medical Services you may have received from other than [facility] providers in the past year (date may be approximate).     Assessment:   This is a routine wellness examination for [place].  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 0 0  Number  falls in past yr: 0 -  Injury with Fall? 0 -  Follow up Falls evaluation completed;Falls prevention discussed Falls evaluation completed    FALL RISK PREVENTION PERTAINING TO THE HOME:  Any stairs in or around the home? Yes  If so, are there any without handrails? No  Home free of loose throw rugs in walkways, pet beds, electrical cords, etc? Yes  Adequate lighting in your home to reduce risk of falls? Yes   ASSISTIVE DEVICES UTILIZED TO PREVENT FALLS:  Life alert? No  Use of a cane, walker or w/c? No  Grab bars in the bathroom? No  Shower chair or bench in shower? No  Elevated toilet seat or a handicapped toilet? No   TIMED UP AND GO:  Was the test performed? No .    Cognitive Function:  Normal cognitive status assessed by direct observation by this Nurse Health Advisor. No abnormalities found.          Immunizations Immunization History  Administered Date(s) Administered   Fluad Quad(high Dose 65+) 06/26/2019, 07/07/2020   Influenza, High Dose Seasonal PF 09/16/2017, 06/22/2018   Janssen (J&J) SARS-COV-2 Vaccination 01/09/2020   Pneumococcal Polysaccharide-23 09/16/2017    TDAP status: Due, Education has been provided regarding the importance of this vaccine. Advised may receive this vaccine at local pharmacy or Health Dept. Aware to provide a copy of the vaccination record if obtained from local pharmacy or Health Dept. Verbalized acceptance and understanding.  Flu Vaccine status: Up to date  Pneumococcal vaccine status: Due, Education has been provided regarding the importance of this vaccine. Advised may receive this vaccine at local pharmacy or Health Dept. Aware to provide a copy of the vaccination record if obtained from local pharmacy or Health Dept. Verbalized acceptance and understanding.  Covid-19 vaccine status: Information provided on how to obtain vaccines.   Qualifies for Shingles Vaccine? Yes   Zostavax completed No   Shingrix Completed?: No.     Education has been provided regarding the importance of this vaccine. Patient has been advised to call insurance company to determine out of pocket expense if they have not yet received this vaccine. Advised may also receive vaccine at local pharmacy or Health Dept. Verbalized acceptance and understanding.  Screening Tests Health Maintenance  Topic Date Due   Hepatitis C Screening  Never done   TETANUS/TDAP  Never done   Zoster Vaccines- Shingrix (1 of 2) Never done   PNA vac Low Risk Adult (2 of 2 - PCV13) 09/16/2018   COVID-19 Vaccine (2 - Booster for Janssen series) 03/05/2020   URINE MICROALBUMIN  12/07/2020   INFLUENZA VACCINE  05/01/2021   HPV VACCINES  Aged Out    Health Maintenance  Health Maintenance Due  Topic Date Due   Hepatitis C Screening  Never done   TETANUS/TDAP  Never done   Zoster Vaccines- Shingrix (1 of 2) Never done   PNA vac Low Risk Adult (2 of 2 - PCV13) 09/16/2018   COVID-19 Vaccine (2 - Booster for Janssen series) 03/05/2020   URINE MICROALBUMIN  12/07/2020   INFLUENZA VACCINE  05/01/2021    Colorectal cancer screening: No longer required.   Lung Cancer Screening: (Low Dose CT Chest recommended if Age 43-80 years, 30 pack-year currently smoking OR have quit w/in 15years.) does not qualify.   Lung Cancer Screening Referral  Additional Screening:  Hepatitis C Screening: does qualify;   Vision Screening: Recommended annual ophthalmology exams for early detection of glaucoma and other disorders of the eye. Is the patient up to date with their annual eye exam?  No  Who is the provider or what is the name of the office in which the patient attends annual eye exams? Declined at this time If pt is not established with a provider, would they like to be referred to a provider to establish care? No .   Dental Screening: Recommended annual dental exams for proper oral hygiene  Community Resource Referral / Chronic Care Management: CRR required this visit?   No   CCM required this visit?  No      Plan:     I have personally reviewed and noted the following in the patient's chart:   Medical and social history Use of alcohol, tobacco or illicit drugs  Current medications and supplements including opioid prescriptions. Patient is currently taking opioid prescriptions. Information provided to patient regarding non-opioid alternatives. Patient advised to discuss non-opioid treatment plan with their provider. Functional ability and status Nutritional status Physical activity Advanced directives List of other physicians Hospitalizations, surgeries, and ER visits in previous 12 months Vitals Screenings to include cognitive, depression, and falls Referrals and appointments  In addition, I have reviewed and discussed with patient certain preventive protocols, quality metrics, and best practice recommendations. A written personalized care plan for preventive services as well as general preventive health recommendations were provided to patient.     Remi Haggard, LPN   05/29/5620   Nurse Notes: na

## 2021-05-23 NOTE — Patient Instructions (Signed)
Ian Ramos , Thank you for taking time to come for your Medicare Wellness Visit. I appreciate your ongoing commitment to your health goals. Please review the following plan we discussed and let me know if I can assist you in the future.   Screening recommendations/referrals: Colonoscopy: no longer required Recommended yearly ophthalmology/optometry visit for glaucoma screening and checkup Recommended yearly dental visit for hygiene and checkup  Vaccinations: Influenza vaccine: up to date Pneumococcal vaccine: Education provided Tdap vaccine: Education provided Shingles vaccine: Education provided    Advanced directives: yes  Conditions/risks identified:   Preventive Care 65 Years and Older, Male Preventive care refers to lifestyle choices and visits with your health care provider that can promote health and wellness. What does preventive care include? A yearly physical exam. This is also called an annual well check. Dental exams once or twice a year. Routine eye exams. Ask your health care provider how often you should have your eyes checked. Personal lifestyle choices, including: Daily care of your teeth and gums. Regular physical activity. Eating a healthy diet. Avoiding tobacco and drug use. Limiting alcohol use. Practicing safe sex. Taking low doses of aspirin every day. Taking vitamin and mineral supplements as recommended by your health care provider. What happens during an annual well check? The services and screenings done by your health care provider during your annual well check will depend on your age, overall health, lifestyle risk factors, and family history of disease. Counseling  Your health care provider may ask you questions about your: Alcohol use. Tobacco use. Drug use. Emotional well-being. Home and relationship well-being. Sexual activity. Eating habits. History of falls. Memory and ability to understand (cognition). Work and work  Astronomer. Screening  You may have the following tests or measurements: Height, weight, and BMI. Blood pressure. Lipid and cholesterol levels. These may be checked every 5 years, or more frequently if you are over 87 years old. Skin check. Lung cancer screening. You may have this screening every year starting at age 77 if you have a 30-pack-year history of smoking and currently smoke or have quit within the past 15 years. Fecal occult blood test (FOBT) of the stool. You may have this test every year starting at age 77. Flexible sigmoidoscopy or colonoscopy. You may have a sigmoidoscopy every 5 years or a colonoscopy every 10 years starting at age 77. Prostate cancer screening. Recommendations will vary depending on your family history and other risks. Hepatitis C blood test. Hepatitis B blood test. Sexually transmitted disease (STD) testing. Diabetes screening. This is done by checking your blood sugar (glucose) after you have not eaten for a while (fasting). You may have this done every 1-3 years. Abdominal aortic aneurysm (AAA) screening. You may need this if you are a current or former smoker. Osteoporosis. You may be screened starting at age 77 if you are at high risk. Talk with your health care provider about your test results, treatment options, and if necessary, the need for more tests. Vaccines  Your health care provider may recommend certain vaccines, such as: Influenza vaccine. This is recommended every year. Tetanus, diphtheria, and acellular pertussis (Tdap, Td) vaccine. You may need a Td booster every 10 years. Zoster vaccine. You may need this after age 62. Pneumococcal 13-valent conjugate (PCV13) vaccine. One dose is recommended after age 69. Pneumococcal polysaccharide (PPSV23) vaccine. One dose is recommended after age 21. Talk to your health care provider about which screenings and vaccines you need and how often you need them. This information  is not intended to replace  advice given to you by your health care provider. Make sure you discuss any questions you have with your health care provider. Document Released: 10/14/2015 Document Revised: 06/06/2016 Document Reviewed: 07/19/2015 Elsevier Interactive Patient Education  2017 [place] Prevention in the Home Falls can cause injuries. They can happen to people of all ages. There are many things you can do to make your home safe and to help prevent falls. What can I do on the outside of my home? Regularly fix the edges of walkways and driveways and fix any cracks. Remove anything that might make you trip as you walk through a door, such as a raised step or threshold. Trim any bushes or trees on the path to your home. Use bright outdoor lighting. Clear any walking paths of anything that might make someone trip, such as rocks or tools. Regularly check to see if handrails are loose or broken. Make sure that both sides of any steps have handrails. Any raised decks and porches should have guardrails on the edges. Have any leaves, snow, or ice cleared regularly. Use sand or salt on walking paths during winter. Clean up any spills in your garage right away. This includes oil or grease spills. What can I do in the bathroom? Use night lights. Install grab bars by the toilet and in the tub and shower. Do not use towel bars as grab bars. Use non-skid mats or decals in the tub or shower. If you need to sit down in the shower, use a plastic, non-slip stool. Keep the floor dry. Clean up any water that spills on the floor as soon as it happens. Remove soap buildup in the tub or shower regularly. Attach bath mats securely with double-sided non-slip rug tape. Do not have throw rugs and other things on the floor that can make you trip. What can I do in the bedroom? Use night lights. Make sure that you have a light by your bed that is easy to reach. Do not use any sheets or blankets that are too big for your bed.  They should not hang down onto the floor. Have a firm chair that has side arms. You can use this for support while you get dressed. Do not have throw rugs and other things on the floor that can make you trip. What can I do in the kitchen? Clean up any spills right away. Avoid walking on wet floors. Keep items that you use a lot in easy-to-reach places. If you need to reach something above you, use a strong step stool that has a grab bar. Keep electrical cords out of the way. Do not use floor polish or wax that makes floors slippery. If you must use wax, use non-skid floor wax. Do not have throw rugs and other things on the floor that can make you trip. What can I do with my stairs? Do not leave any items on the stairs. Make sure that there are handrails on both sides of the stairs and use them. Fix handrails that are broken or loose. Make sure that handrails are as long as the stairways. Check any carpeting to make sure that it is firmly attached to the stairs. Fix any carpet that is loose or worn. Avoid having throw rugs at the top or bottom of the stairs. If you do have throw rugs, attach them to the floor with carpet tape. Make sure that you have a light switch at the top of  the stairs and the bottom of the stairs. If you do not have them, ask someone to add them for you. What else can I do to help prevent falls? Wear shoes that: Do not have high heels. Have rubber bottoms. Are comfortable and fit you well. Are closed at the toe. Do not wear sandals. If you use a stepladder: Make sure that it is fully opened. Do not climb a closed stepladder. Make sure that both sides of the stepladder are locked into place. Ask someone to hold it for you, if possible. Clearly mark and make sure that you can see: Any grab bars or handrails. First and last steps. Where the edge of each step is. Use tools that help you move around (mobility aids) if they are needed. These  include: Canes. Walkers. Scooters. Crutches. Turn on the lights when you go into a dark area. Replace any light bulbs as soon as they burn out. Set up your furniture so you have a clear path. Avoid moving your furniture around. If any of your floors are uneven, fix them. If there are any pets around you, be aware of where they are. Review your medicines with your doctor. Some medicines can make you feel dizzy. This can increase your chance of falling. Ask your doctor what other things that you can do to help prevent falls. This information is not intended to replace advice given to you by your health care provider. Make sure you discuss any questions you have with your health care provider. Document Released: 07/14/2009 Document Revised: 02/23/2016 Document Reviewed: 10/22/2014 Elsevier Interactive Patient Education  2017 [employer].

## 2021-05-25 ENCOUNTER — Telehealth: Payer: Self-pay | Admitting: Family Medicine

## 2021-05-25 ENCOUNTER — Other Ambulatory Visit: Payer: Self-pay

## 2021-05-25 ENCOUNTER — Ambulatory Visit (INDEPENDENT_AMBULATORY_CARE_PROVIDER_SITE_OTHER): Payer: Medicare Other | Admitting: Family Medicine

## 2021-05-25 ENCOUNTER — Encounter: Payer: Self-pay | Admitting: Family Medicine

## 2021-05-25 VITALS — BP 124/68 | HR 68 | Temp 97.5°F | Ht 67.0 in | Wt 190.6 lb

## 2021-05-25 DIAGNOSIS — M542 Cervicalgia: Secondary | ICD-10-CM | POA: Insufficient documentation

## 2021-05-25 DIAGNOSIS — R7309 Other abnormal glucose: Secondary | ICD-10-CM

## 2021-05-25 DIAGNOSIS — E538 Deficiency of other specified B group vitamins: Secondary | ICD-10-CM

## 2021-05-25 DIAGNOSIS — I2583 Coronary atherosclerosis due to lipid rich plaque: Secondary | ICD-10-CM | POA: Diagnosis not present

## 2021-05-25 DIAGNOSIS — F341 Dysthymic disorder: Secondary | ICD-10-CM | POA: Diagnosis not present

## 2021-05-25 DIAGNOSIS — R5382 Chronic fatigue, unspecified: Secondary | ICD-10-CM

## 2021-05-25 DIAGNOSIS — N5201 Erectile dysfunction due to arterial insufficiency: Secondary | ICD-10-CM

## 2021-05-25 DIAGNOSIS — F321 Major depressive disorder, single episode, moderate: Secondary | ICD-10-CM | POA: Diagnosis not present

## 2021-05-25 DIAGNOSIS — G959 Disease of spinal cord, unspecified: Secondary | ICD-10-CM

## 2021-05-25 DIAGNOSIS — I2510 Atherosclerotic heart disease of native coronary artery without angina pectoris: Secondary | ICD-10-CM

## 2021-05-25 DIAGNOSIS — Z9889 Other specified postprocedural states: Secondary | ICD-10-CM

## 2021-05-25 DIAGNOSIS — E611 Iron deficiency: Secondary | ICD-10-CM | POA: Diagnosis not present

## 2021-05-25 DIAGNOSIS — I6523 Occlusion and stenosis of bilateral carotid arteries: Secondary | ICD-10-CM | POA: Insufficient documentation

## 2021-05-25 LAB — HEPATIC FUNCTION PANEL
ALT: 19 U/L (ref 0–53)
AST: 18 U/L (ref 0–37)
Albumin: 3.8 g/dL (ref 3.5–5.2)
Alkaline Phosphatase: 102 U/L (ref 39–117)
Bilirubin, Direct: 0.2 mg/dL (ref 0.0–0.3)
Total Bilirubin: 0.7 mg/dL (ref 0.2–1.2)
Total Protein: 6.3 g/dL (ref 6.0–8.3)

## 2021-05-25 LAB — BASIC METABOLIC PANEL
BUN: 21 mg/dL (ref 6–23)
CO2: 25 mEq/L (ref 19–32)
Calcium: 8.8 mg/dL (ref 8.4–10.5)
Chloride: 108 mEq/L (ref 96–112)
Creatinine, Ser: 1.69 mg/dL — ABNORMAL HIGH (ref 0.40–1.50)
GFR: 38.87 mL/min — ABNORMAL LOW (ref >60.00)
Glucose, Bld: 95 mg/dL (ref 70–99)
Potassium: 5.3 mEq/L — ABNORMAL HIGH (ref 3.5–5.1)
Sodium: 139 mEq/L (ref 135–145)

## 2021-05-25 LAB — URINALYSIS, ROUTINE W REFLEX MICROSCOPIC
Bilirubin Urine: NEGATIVE
Hgb urine dipstick: NEGATIVE
Ketones, ur: NEGATIVE
Leukocytes,Ua: NEGATIVE
Nitrite: NEGATIVE
RBC / HPF: NONE SEEN (ref 0–?)
Specific Gravity, Urine: 1.025 (ref 1.000–1.030)
Total Protein, Urine: NEGATIVE
Urine Glucose: NEGATIVE
Urobilinogen, UA: 0.2 (ref 0.0–1.0)
WBC, UA: NONE SEEN (ref 0–?)
pH: 6 (ref 5.0–8.0)

## 2021-05-25 LAB — LIPID PANEL
Cholesterol: 94 mg/dL (ref 0–200)
HDL: 29.3 mg/dL — ABNORMAL LOW (ref >39.00)
LDL Cholesterol: 39 mg/dL (ref 0–99)
NonHDL: 65.07
Total CHOL/HDL Ratio: 3
Triglycerides: 132 mg/dL (ref 0.0–149.0)
VLDL: 26.4 mg/dL (ref 0.0–40.0)

## 2021-05-25 LAB — CBC
HCT: 39.5 % (ref 39.0–52.0)
Hemoglobin: 13.2 g/dL (ref 13.0–17.0)
MCHC: 33.4 g/dL (ref 30.0–36.0)
MCV: 89.4 fl (ref 78.0–100.0)
Platelets: 154 10*3/uL (ref 150.0–400.0)
RBC: 4.42 Mil/uL (ref 4.22–5.81)
RDW: 12.8 % (ref 11.5–15.5)
WBC: 7.3 10*3/uL (ref 4.0–10.5)

## 2021-05-25 LAB — VITAMIN B12: Vitamin B-12: 659 pg/mL (ref 211–911)

## 2021-05-25 LAB — HEMOGLOBIN A1C: Hgb A1c MFr Bld: 7.4 % — ABNORMAL HIGH (ref 4.6–6.5)

## 2021-05-25 MED ORDER — HYDROCODONE-ACETAMINOPHEN 5-325 MG PO TABS
1.0000 | ORAL_TABLET | Freq: Four times a day (QID) | ORAL | 0 refills | Status: DC | PRN
Start: 2021-05-25 — End: 2021-08-29

## 2021-05-25 MED ORDER — FLUOXETINE HCL 10 MG PO TABS: ORAL_TABLET | ORAL | 3 refills | Status: DC

## 2021-05-25 MED ORDER — SILDENAFIL CITRATE 20 MG PO TABS: ORAL_TABLET | ORAL | 2 refills | Status: DC

## 2021-05-25 NOTE — Progress Notes (Signed)
Established Patient Office Visit  Subjective:  Patient ID: Ian Ramos, male    DOB: 1946-03-24  Age: 75 y.o. MRN: [id number]  CC:  Chief Complaint  Patient presents with   Follow-up    Follow up on medications, C/O neck pains and feeling tired all the time.     HPI Ian Ramos presents for follow-up of chronic neck pain, coronary artery disease and ongoing fatigue.  Continues with atorvastatin at high dose and carvedilol.  Denies exertional chest pain or shortness of breath.  He is able to bale hay with some difficulty.  Fatigue is ongoing.  He took the Prozac until it ran out.  Insists that he gets itchy and there  Past Medical History:  Diagnosis Date   Arthritis    CAD in native artery    a. cath 06/2018 - Eccentric 75% proximal LAD; 75% OM2; occluded RV with collaterals   CKD (chronic kidney disease), stage III (HCC)    MI (myocardial infarction) (HCC)    Neuromuscular disorder (HCC)    Stroke [facility])     Past Surgical History:  Procedure Laterality Date   CAROTID PTA/STENT INTERVENTION N/A 06/27/2018   Procedure: CAROTID PTA/STENT INTERVENTION;  Surgeon: Sherren Kerns, MD;  Location: [facility] INVASIVE CV LAB;  Service: Cardiovascular;  Laterality: N/A;   LEFT HEART CATH AND CORONARY ANGIOGRAPHY N/A 06/05/2018   Procedure: LEFT HEART CATH AND CORONARY ANGIOGRAPHY;  Surgeon: Swaziland, Peter M, MD;  Location: [facility] INVASIVE CV LAB;  Service: Cardiovascular;  Laterality: N/A;   POSTERIOR CERVICAL FUSION/FORAMINOTOMY N/A 05/23/2018   Procedure: Posterior Cervical Fusion with lateral mass fixation - C1 - C6 with laminectomy;  Surgeon: Julio Sicks, MD;  Location: [facility] OR;  Service: Neurosurgery;  Laterality: N/A;    Family History  Problem Relation Age of Onset   Hypertension Other    Heart failure Mother    CAD Father        s/p CABG in his 32s   Heart failure Father     Social History   Socioeconomic History   Marital status: Divorced    Spouse name: Not on file    Number of children: Not on file   Years of education: Not on file   Highest education level: Not on file  Occupational History   Not on file  Tobacco Use   Smoking status: Never   Smokeless tobacco: Current    Types: Chew  Vaping Use   Vaping Use: Never used  Substance and Sexual Activity   Alcohol use: No   Drug use: No   Sexual activity: Not on file  Other Topics Concern   Not on file  Social History Narrative   NA   Social Determinants of Health   Financial Resource Strain: Low Risk    Difficulty of Paying Living Expenses: Not hard at all  Food Insecurity: No Food Insecurity   Worried About Programme researcher, broadcasting/film/video in the Last Year: Never true   Ran Out of Food in the Last Year: Never true  Transportation Needs: No Transportation Needs   Lack of Transportation (Medical): No   Lack of Transportation (Non-Medical): No  Physical Activity: Insufficiently Active   Days of Exercise per Week: 3 days   Minutes of Exercise per Session: 20 min  Stress: No Stress Concern Present   Feeling of Stress : Not at all  Social Connections: Moderately Isolated   Frequency of Communication with Friends and Family: More than three times a week  Frequency of Social Gatherings with Friends and Family: More than three times a week   Attends Religious Services: Never   Database administrator or Organizations: No   Attends Engineer, structural: Never   Marital Status: Married  Catering manager Violence: Not At Risk   Fear of Current or Ex-Partner: No   Emotionally Abused: No   Physically Abused: No   Sexually Abused: No    Outpatient Medications Prior to Visit  Medication Sig Dispense Refill   aspirin EC 81 MG tablet Take 81 mg by mouth daily.     atorvastatin (LIPITOR) 80 MG tablet TAKE 1 TABLET(80 MG) BY MOUTH DAILY 90 tablet 2   B Complex Vitamins (B COMPLEX-B12) TABS Take one tablet daily 90 tablet 1   carvedilol (COREG) 3.125 MG tablet TAKE 1 TABLET(3.125 MG) BY MOUTH TWICE  DAILY WITH A MEAL 180 tablet 0   clopidogrel (PLAVIX) 75 MG tablet PLACE 1 TABLET INTO FEEDING TUBE DAILY 90 tablet 0   acetaminophen (TYLENOL) 325 MG tablet Take 2 tablets (650 mg total) by mouth every 4 (four) hours as needed for headache or mild pain.     HYDROcodone-acetaminophen (NORCO) 10-325 MG tablet Take 1/2 to 1 twice daily as needed for neck pain. 60 tablet 0   FEROSUL 325 (65 Fe) MG tablet TAKE 1 TABLET(325 MG) BY MOUTH DAILY WITH BREAKFAST (Patient not taking: Reported on 05/25/2021) 90 tablet 1   FLUoxetine (PROZAC) 10 MG capsule Take one daily for one week and then increase to 2 daily. (Patient not taking: No sig reported) 60 capsule 3   sildenafil (REVATIO) 20 MG tablet TAKE 3 TO 5 TABLETS DAILY 45 MINUTES PRIOR (Patient not taking: Reported on 05/25/2021) 25 tablet 2   Facility-Administered Medications Prior to Visit  Medication Dose Route Frequency Provider Last Rate Last Admin   etomidate (AMIDATE) injection    Anesthesia Intra-op Lucinda Dell, CRNA   12 mg at 06/09/18 1445   succinylcholine (ANECTINE) injection    Anesthesia Intra-op Lucinda Dell, CRNA   100 mg at 06/09/18 1445    Allergies  Allergen Reactions   Cymbalta [Duloxetine Hcl] Nausea And Vomiting    ROS Review of Systems  Constitutional: Negative.   HENT: Negative.    Eyes:  Negative for photophobia and visual disturbance.  Respiratory:  Negative for chest tightness, shortness of breath and wheezing.   Cardiovascular:  Negative for chest pain and palpitations.  Gastrointestinal: Negative.   Endocrine: Negative for polyphagia and polyuria.  Musculoskeletal:  Positive for neck pain and neck stiffness.  Neurological:  Negative for speech difficulty and weakness.  Hematological:  Bruises/bleeds easily.  Psychiatric/Behavioral:  Positive for dysphoric mood.      Depression screen [facility] 2/9 05/25/2021 05/25/2021 05/23/2021  Decreased Interest 0 1 0  Down, Depressed, Hopeless 0 0 0  PHQ - 2 Score 0 1  0  Altered sleeping 0 - -  Tired, decreased energy 2 - -  Change in appetite 0 - -  Feeling bad or failure about yourself  0 - -  Trouble concentrating 0 - -  Moving slowly or fidgety/restless 0 - -  Suicidal thoughts 0 - -  PHQ-9 Score 2 - -  Difficult doing work/chores Not difficult at all - -     Objective:    Physical Exam Vitals and nursing note reviewed.  Constitutional:      General: He is not in acute distress.    Appearance: Normal appearance. He is  not ill-appearing, toxic-appearing or diaphoretic.  HENT:     Head: Normocephalic and atraumatic.     Right Ear: Tympanic membrane, ear canal and external ear normal.     Left Ear: Tympanic membrane, ear canal and external ear normal.     Mouth/Throat:     Mouth: Mucous membranes are moist.     Pharynx: Oropharynx is clear. No oropharyngeal exudate or posterior oropharyngeal erythema.  Eyes:     General: No scleral icterus.       Right eye: No discharge.        Left eye: No discharge.     Extraocular Movements: Extraocular movements intact.     Conjunctiva/sclera: Conjunctivae normal.     Pupils: Pupils are equal, round, and reactive to light.  Cardiovascular:     Rate and Rhythm: Normal rate and regular rhythm.  Pulmonary:     Effort: Pulmonary effort is normal.     Breath sounds: Normal breath sounds.  Abdominal:     General: Bowel sounds are normal.  Musculoskeletal:     Cervical back: No spasms or tenderness. Decreased range of motion.  Lymphadenopathy:     Cervical: No cervical adenopathy.  Skin:    General: Skin is warm and dry.  Neurological:     Mental Status: He is alert and oriented to person, place, and time.  Psychiatric:        Mood and Affect: Mood normal.        Behavior: Behavior normal.    BP 124/68 (BP Location: Right Arm, Patient Position: Sitting, Cuff Size: Normal)   Pulse 68   Temp (!) 97.5 F (36.4 C) (Temporal)   Ht 5\' 7"  (1.702 m)   Wt 190 lb 9.6 oz (86.5 kg)   SpO2 95%   BMI  29.85 kg/m  Wt Readings from Last 3 Encounters:  05/25/21 190 lb 9.6 oz (86.5 kg)  07/22/20 178 lb 3.2 oz (80.8 kg)  07/07/20 178 lb 3.2 oz (80.8 kg)     Health Maintenance Due  Topic Date Due   Hepatitis C Screening  Never done   TETANUS/TDAP  Never done   Zoster Vaccines- Shingrix (1 of 2) Never done   PNA vac Low Risk Adult (2 of 2 - PCV13) 09/16/2018   URINE MICROALBUMIN  12/07/2020   INFLUENZA VACCINE  05/01/2021    There are no preventive care reminders to display for this patient.  Lab Results  Component Value Date   TSH 2.45 12/08/2019   Lab Results  Component Value Date   WBC 8.6 05/23/2020   HGB 14.4 05/23/2020   HCT 43.5 05/23/2020   MCV 91.6 05/23/2020   PLT 159.0 05/23/2020   Lab Results  Component Value Date   NA 139 05/23/2020   K 4.8 05/23/2020   CO2 25 05/23/2020   GLUCOSE 127 (H) 05/23/2020   BUN 26 (H) 05/23/2020   CREATININE 1.71 (H) 05/23/2020   BILITOT 0.6 12/08/2019   ALKPHOS 110 12/08/2019   AST 19 12/08/2019   ALT 21 12/08/2019   PROT 6.5 12/08/2019   ALBUMIN 3.9 12/08/2019   CALCIUM 9.1 05/23/2020   ANIONGAP 11 06/09/2019   GFR 39.14 (L) 05/23/2020   Lab Results  Component Value Date   CHOL 109 12/08/2019   Lab Results  Component Value Date   HDL 29.00 (L) 12/08/2019   Lab Results  Component Value Date   LDLCALC 54 12/08/2019   Lab Results  Component Value Date   TRIG 133.0 12/08/2019  Lab Results  Component Value Date   CHOLHDL 4 12/08/2019   Lab Results  Component Value Date   HGBA1C 6.8 (H) 12/08/2019      Assessment & Plan:   Problem List Items Addressed This Visit       Cardiovascular and Mediastinum   Coronary artery disease due to lipid rich plaque   Relevant Medications   sildenafil (REVATIO) 20 MG tablet   Other Relevant Orders   Lipid panel   Erectile dysfunction due to arterial insufficiency   Relevant Medications   sildenafil (REVATIO) 20 MG tablet   Bilateral carotid artery stenosis    Relevant Medications   sildenafil (REVATIO) 20 MG tablet     Nervous and Auditory   Cervical myelopathy (HCC) - Primary     Other   B12 deficiency   Relevant Orders   Vitamin B12   Iron deficiency   Relevant Orders   CBC   Iron, TIBC and Ferritin Panel   Chronic fatigue   Relevant Orders   Hepatic function panel   Elevated glucose   Relevant Orders   Basic metabolic panel   Hemoglobin A1c   Urinalysis, Routine w reflex microscopic   Depression, major, single episode, moderate (HCC)   Relevant Medications   FLUoxetine (PROZAC) 10 MG tablet   Dysthymia   Relevant Medications   FLUoxetine (PROZAC) 10 MG tablet   Neck pain with history of cervical spinal surgery   Relevant Medications   HYDROcodone-acetaminophen (NORCO) 5-325 MG tablet    Meds ordered this encounter  Medications   FLUoxetine (PROZAC) 10 MG tablet    Sig: Take 1 tablet (10 mg total) by mouth daily for 7 days, THEN 2 tablets (20 mg total) daily.    Dispense:  90 tablet    Refill:  3   HYDROcodone-acetaminophen (NORCO) 5-325 MG tablet    Sig: Take 1 tablet by mouth every 6 (six) hours as needed for moderate pain.    Dispense:  60 tablet    Refill:  0   sildenafil (REVATIO) 20 MG tablet    Sig: TAKE 3 TO 5 TABLETS DAILY 45 MINUTES PRIOR    Dispense:  25 tablet    Refill:  2     Follow-up: Return in about 3 months (around 08/25/2021), or Recommend zoster vaccine.Marland Kitchen  Restart Prozac for mood elevation and hopefully some relief of fatigue.  Norco to be used regularly as needed.  Advised Zovirax vaccine.  Mliss Sax, MD

## 2021-05-25 NOTE — Telephone Encounter (Signed)
Pt called concerned that his notes say "feeding tube" he would like this removed from his information bc he does not use a feeding tube.

## 2021-05-26 LAB — IRON,TIBC AND FERRITIN PANEL
%SAT: 35 % (calc) (ref 20–48)
Ferritin: 156 ng/mL (ref 24–380)
Iron: 94 ug/dL (ref 50–180)
TIBC: 270 mcg/dL (calc) (ref 250–425)

## 2021-05-26 NOTE — Telephone Encounter (Signed)
done

## 2021-05-26 NOTE — Addendum Note (Signed)
Addended by: Lake Bells on: 05/26/2021 10:22 AM   Modules accepted: Orders

## 2021-05-26 NOTE — Progress Notes (Signed)
There are multiple lab issues and concerns. Need to set up a virtual to discuss please.

## 2021-06-02 ENCOUNTER — Ambulatory Visit (INDEPENDENT_AMBULATORY_CARE_PROVIDER_SITE_OTHER): Payer: Medicare Other | Admitting: Family Medicine

## 2021-06-02 ENCOUNTER — Other Ambulatory Visit: Payer: Self-pay

## 2021-06-02 ENCOUNTER — Other Ambulatory Visit (INDEPENDENT_AMBULATORY_CARE_PROVIDER_SITE_OTHER): Payer: Medicare Other

## 2021-06-02 ENCOUNTER — Encounter: Payer: Self-pay | Admitting: Family Medicine

## 2021-06-02 VITALS — Ht 67.0 in

## 2021-06-02 DIAGNOSIS — I2510 Atherosclerotic heart disease of native coronary artery without angina pectoris: Secondary | ICD-10-CM | POA: Diagnosis not present

## 2021-06-02 DIAGNOSIS — N1832 Chronic kidney disease, stage 3b: Secondary | ICD-10-CM | POA: Insufficient documentation

## 2021-06-02 DIAGNOSIS — E1122 Type 2 diabetes mellitus with diabetic chronic kidney disease: Secondary | ICD-10-CM | POA: Diagnosis not present

## 2021-06-02 DIAGNOSIS — I6523 Occlusion and stenosis of bilateral carotid arteries: Secondary | ICD-10-CM | POA: Diagnosis not present

## 2021-06-02 DIAGNOSIS — E875 Hyperkalemia: Secondary | ICD-10-CM

## 2021-06-02 DIAGNOSIS — E1165 Type 2 diabetes mellitus with hyperglycemia: Secondary | ICD-10-CM | POA: Insufficient documentation

## 2021-06-02 DIAGNOSIS — I2583 Coronary atherosclerosis due to lipid rich plaque: Secondary | ICD-10-CM

## 2021-06-02 MED ORDER — RYBELSUS 7 MG PO TABS: 7.0000 mg | ORAL_TABLET | Freq: Every day | ORAL | 2 refills | Status: DC

## 2021-06-02 MED ORDER — CLOPIDOGREL BISULFATE 75 MG PO TABS: 75.0000 mg | ORAL_TABLET | Freq: Every day | ORAL | 2 refills | Status: DC

## 2021-06-02 NOTE — Progress Notes (Signed)
Established Patient Office Visit  Subjective:  Patient ID: Ian Ramos, male    DOB: [birth year]  Age: 75 y.o. MRN: [id number]  CC:  Chief Complaint  Patient presents with   Advice Only    Discuss labs     HPI Ian Ramos presents for discussion of his lab work.  Unable to do video conferencing.  He is at home with his significant other.  He has never been diagnosed with diabetes.  He has gained some weight since his last visit.  Lab work from last visit revealed new onset diabetes as well as elevated potassium.  He is taking no potassium supplements.  Past Medical History:  Diagnosis Date   Arthritis    CAD in native artery    a. cath 06/2018 - Eccentric 75% proximal LAD; 75% OM2; occluded RV with collaterals   CKD (chronic kidney disease), stage III (HCC)    MI (myocardial infarction) (HCC)    Neuromuscular disorder (HCC)    Stroke [facility])     Past Surgical History:  Procedure Laterality Date   CAROTID PTA/STENT INTERVENTION N/A 06/27/2018   Procedure: CAROTID PTA/STENT INTERVENTION;  Surgeon: Sherren Kerns, MD;  Location: [facility] INVASIVE CV LAB;  Service: Cardiovascular;  Laterality: N/A;   LEFT HEART CATH AND CORONARY ANGIOGRAPHY N/A 06/05/2018   Procedure: LEFT HEART CATH AND CORONARY ANGIOGRAPHY;  Surgeon: Swaziland, Peter M, MD;  Location: [facility] INVASIVE CV LAB;  Service: Cardiovascular;  Laterality: N/A;   POSTERIOR CERVICAL FUSION/FORAMINOTOMY N/A 05/23/2018   Procedure: Posterior Cervical Fusion with lateral mass fixation - C1 - C6 with laminectomy;  Surgeon: Julio Sicks, MD;  Location: [facility] OR;  Service: Neurosurgery;  Laterality: N/A;    Family History  Problem Relation Age of Onset   Hypertension Other    Heart failure Mother    CAD Father        s/p CABG in his 25s   Heart failure Father     Social History   Socioeconomic History   Marital status: Divorced    Spouse name: Not on file   Number of children: Not on file   Years of education: Not on file    Highest education level: Not on file  Occupational History   Not on file  Tobacco Use   Smoking status: Never   Smokeless tobacco: Current    Types: Chew  Vaping Use   Vaping Use: Never used  Substance and Sexual Activity   Alcohol use: No   Drug use: No   Sexual activity: Not on file  Other Topics Concern   Not on file  Social History Narrative   NA   Social Determinants of Health   Financial Resource Strain: Low Risk    Difficulty of Paying Living Expenses: Not hard at all  Food Insecurity: No Food Insecurity   Worried About Programme researcher, broadcasting/film/video in the Last Year: Never true   Ran Out of Food in the Last Year: Never true  Transportation Needs: No Transportation Needs   Lack of Transportation (Medical): No   Lack of Transportation (Non-Medical): No  Physical Activity: Insufficiently Active   Days of Exercise per Week: 3 days   Minutes of Exercise per Session: 20 min  Stress: No Stress Concern Present   Feeling of Stress : Not at all  Social Connections: Moderately Isolated   Frequency of Communication with Friends and Family: More than three times a week   Frequency of Social Gatherings with Friends and Family:  More than three times a week   Attends Religious Services: Never   Active Member of Clubs or Organizations: No   Attends Banker Meetings: Never   Marital Status: Married  Catering manager Violence: Not At Risk   Fear of Current or Ex-Partner: No   Emotionally Abused: No   Physically Abused: No   Sexually Abused: No    Outpatient Medications Prior to Visit  Medication Sig Dispense Refill   aspirin EC 81 MG tablet Take 81 mg by mouth daily.     atorvastatin (LIPITOR) 80 MG tablet TAKE 1 TABLET(80 MG) BY MOUTH DAILY 90 tablet 2   B Complex Vitamins (B COMPLEX-B12) TABS Take one tablet daily 90 tablet 1   carvedilol (COREG) 3.125 MG tablet TAKE 1 TABLET(3.125 MG) BY MOUTH TWICE DAILY WITH A MEAL 180 tablet 0   FLUoxetine (PROZAC) 10 MG tablet  Take 1 tablet (10 mg total) by mouth daily for 7 days, THEN 2 tablets (20 mg total) daily. 90 tablet 3   HYDROcodone-acetaminophen (NORCO) 5-325 MG tablet Take 1 tablet by mouth every 6 (six) hours as needed for moderate pain. 60 tablet 0   sildenafil (REVATIO) 20 MG tablet TAKE 3 TO 5 TABLETS DAILY 45 MINUTES PRIOR 25 tablet 2   clopidogrel (PLAVIX) 75 MG tablet Take 75 mg by mouth daily.     FEROSUL 325 (65 Fe) MG tablet TAKE 1 TABLET(325 MG) BY MOUTH DAILY WITH BREAKFAST (Patient not taking: No sig reported) 90 tablet 1   Facility-Administered Medications Prior to Visit  Medication Dose Route Frequency Provider Last Rate Last Admin   etomidate (AMIDATE) injection    Anesthesia Intra-op Lucinda Dell, CRNA   12 mg at 06/09/18 1445   succinylcholine (ANECTINE) injection    Anesthesia Intra-op Lucinda Dell, CRNA   100 mg at 06/09/18 1445    Allergies  Allergen Reactions   Cymbalta [Duloxetine Hcl] Nausea And Vomiting    ROS Review of Systems  Constitutional: Negative.   HENT: Negative.    Respiratory: Negative.    Endocrine: Negative for polyphagia and polyuria.     Objective:    Physical Exam Constitutional:      General: He is not in acute distress.    Appearance: Normal appearance.  Pulmonary:     Effort: Pulmonary effort is normal.  Neurological:     Mental Status: He is alert and oriented to person, place, and time.     Motor: Weakness present.  Psychiatric:        Mood and Affect: Mood normal.        Behavior: Behavior normal.    Ht 5\' 7"  (1.702 m)   BMI 29.85 kg/m  Wt Readings from Last 3 Encounters:  05/25/21 190 lb 9.6 oz (86.5 kg)  07/22/20 178 lb 3.2 oz (80.8 kg)  07/07/20 178 lb 3.2 oz (80.8 kg)     Health Maintenance Due  Topic Date Due   FOOT EXAM  Never done   OPHTHALMOLOGY EXAM  Never done   Hepatitis C Screening  Never done   TETANUS/TDAP  Never done   Zoster Vaccines- Shingrix (1 of 2) Never done   PNA vac Low Risk Adult (2 of 2 -  PCV13) 09/16/2018   URINE MICROALBUMIN  12/07/2020   INFLUENZA VACCINE  05/01/2021    There are no preventive care reminders to display for this patient.  Lab Results  Component Value Date   TSH 2.45 12/08/2019   Lab Results  Component Value Date   WBC 7.3 05/25/2021   HGB 13.2 05/25/2021   HCT 39.5 05/25/2021   MCV 89.4 05/25/2021   PLT 154.0 05/25/2021   Lab Results  Component Value Date   NA 139 05/25/2021   K 5.3 (H) 05/25/2021   CO2 25 05/25/2021   GLUCOSE 95 05/25/2021   BUN 21 05/25/2021   CREATININE 1.69 (H) 05/25/2021   BILITOT 0.7 05/25/2021   ALKPHOS 102 05/25/2021   AST 18 05/25/2021   ALT 19 05/25/2021   PROT 6.3 05/25/2021   ALBUMIN 3.8 05/25/2021   CALCIUM 8.8 05/25/2021   ANIONGAP 11 06/09/2019   GFR 38.87 (L) 05/25/2021   Lab Results  Component Value Date   CHOL 94 05/25/2021   Lab Results  Component Value Date   HDL 29.30 (L) 05/25/2021   Lab Results  Component Value Date   LDLCALC 39 05/25/2021   Lab Results  Component Value Date   TRIG 132.0 05/25/2021   Lab Results  Component Value Date   CHOLHDL 3 05/25/2021   Lab Results  Component Value Date   HGBA1C 7.4 (H) 05/25/2021      Assessment & Plan:   Problem List Items Addressed This Visit       Cardiovascular and Mediastinum   Coronary artery disease due to lipid rich plaque   Relevant Medications   clopidogrel (PLAVIX) 75 MG tablet   Bilateral carotid artery stenosis   Relevant Medications   clopidogrel (PLAVIX) 75 MG tablet     Endocrine   Type 2 diabetes mellitus with stage 3b chronic kidney disease, without long-term current use of insulin (HCC)   Relevant Medications   Semaglutide (RYBELSUS) 7 MG TABS     Other   Hyperkalemia - Primary   Relevant Orders   Potassium    Meds ordered this encounter  Medications   Semaglutide (RYBELSUS) 7 MG TABS    Sig: Take 7 mg by mouth daily.    Dispense:  30 tablet    Refill:  2   clopidogrel (PLAVIX) 75 MG tablet     Sig: Take 1 tablet (75 mg total) by mouth daily.    Dispense:  90 tablet    Refill:  2     Follow-up: Return 11/29 at 2P.  Will start Rybelsus for diabetes.  Metformin is not indicated in his situation.  He will RTC for redraw of his potassium.  Encouraged increased fluid intake and weight loss.  He has already has an existing appointment on the 29th.  Interactive video and audio telecommunications were attempted between myself and the patient. However they failed due to the patient having technical difficulties or not having access to video capability. We continued and completed with audio only.    Virtual Visit via Telephone Note  I connected with Jackquline Denmark Rappleye on 06/02/21 at  1:00 PM EDT by telephone and verified that I am speaking with the correct person using two identifiers.  Location: Patient: at home with sig other.  Provider: work   I discussed the limitations, risks, security and privacy concerns of performing an evaluation and management service by telephone and the availability of in person appointments. I also discussed with the patient that there may be a patient responsible charge related to this service. The patient expressed understanding and agreed to proceed.   History of Present Illness:    Observations/Objective:   Assessment and Plan:   Follow Up Instructions:    I discussed the assessment and treatment  plan with the patient. The patient was provided an opportunity to ask questions and all were answered. The patient agreed with the plan and demonstrated an understanding of the instructions.   The patient was advised to call back or seek an in-person evaluation if the symptoms worsen or if the condition fails to improve as anticipated.  I provided 22 minutes of non-face-to-face time during this encounter.   Mliss Sax, MD  Mliss Sax, MD

## 2021-06-02 NOTE — Progress Notes (Signed)
Per orders of Dr. Kremer pt is here for labs pt tolerated draw well.  

## 2021-06-02 NOTE — Addendum Note (Signed)
Addended by: Renaldo Reel S on: 06/02/2021 03:13 PM   Modules accepted: Orders

## 2021-06-03 LAB — POTASSIUM: Potassium: 4.4 mmol/L (ref 3.5–5.3)

## 2021-06-08 ENCOUNTER — Telehealth: Payer: Self-pay

## 2021-06-08 NOTE — Telephone Encounter (Signed)
PA for Rebelsus 7 mg submitted through cover my meds. Awaiting response. Dm/cma   (Key: [id number]

## 2021-06-09 NOTE — Telephone Encounter (Signed)
PA approved Effective from 06/08/2021 through 06/08/2022.pharmacy notified VIA phone.  Dm/cma

## 2021-07-23 ENCOUNTER — Other Ambulatory Visit: Payer: Self-pay | Admitting: Family Medicine

## 2021-07-23 DIAGNOSIS — I2510 Atherosclerotic heart disease of native coronary artery without angina pectoris: Secondary | ICD-10-CM

## 2021-07-23 DIAGNOSIS — I10 Essential (primary) hypertension: Secondary | ICD-10-CM

## 2021-08-04 ENCOUNTER — Other Ambulatory Visit: Payer: Self-pay | Admitting: Family Medicine

## 2021-08-04 DIAGNOSIS — I6523 Occlusion and stenosis of bilateral carotid arteries: Secondary | ICD-10-CM

## 2021-08-04 DIAGNOSIS — I2583 Coronary atherosclerosis due to lipid rich plaque: Secondary | ICD-10-CM

## 2021-08-04 DIAGNOSIS — I2510 Atherosclerotic heart disease of native coronary artery without angina pectoris: Secondary | ICD-10-CM

## 2021-08-05 ENCOUNTER — Other Ambulatory Visit: Payer: Self-pay | Admitting: Family Medicine

## 2021-08-05 DIAGNOSIS — I6523 Occlusion and stenosis of bilateral carotid arteries: Secondary | ICD-10-CM

## 2021-08-05 DIAGNOSIS — I2510 Atherosclerotic heart disease of native coronary artery without angina pectoris: Secondary | ICD-10-CM

## 2021-08-29 ENCOUNTER — Ambulatory Visit (INDEPENDENT_AMBULATORY_CARE_PROVIDER_SITE_OTHER): Payer: Medicare Other | Admitting: Family Medicine

## 2021-08-29 ENCOUNTER — Other Ambulatory Visit: Payer: Self-pay

## 2021-08-29 ENCOUNTER — Encounter: Payer: Self-pay | Admitting: Family Medicine

## 2021-08-29 VITALS — BP 100/62 | HR 72 | Temp 97.7°F | Ht 67.0 in | Wt 175.0 lb

## 2021-08-29 DIAGNOSIS — N1832 Chronic kidney disease, stage 3b: Secondary | ICD-10-CM | POA: Diagnosis not present

## 2021-08-29 DIAGNOSIS — F321 Major depressive disorder, single episode, moderate: Secondary | ICD-10-CM

## 2021-08-29 DIAGNOSIS — E1122 Type 2 diabetes mellitus with diabetic chronic kidney disease: Secondary | ICD-10-CM

## 2021-08-29 DIAGNOSIS — G959 Disease of spinal cord, unspecified: Secondary | ICD-10-CM | POA: Diagnosis not present

## 2021-08-29 DIAGNOSIS — R5382 Chronic fatigue, unspecified: Secondary | ICD-10-CM

## 2021-08-29 MED ORDER — HYDROCODONE-ACETAMINOPHEN 7.5-325 MG PO TABS: 1.0000 | ORAL_TABLET | Freq: Four times a day (QID) | ORAL | 0 refills | Status: DC | PRN

## 2021-08-29 MED ORDER — VENLAFAXINE HCL ER 75 MG PO CP24: 75.0000 mg | ORAL_CAPSULE | Freq: Every day | ORAL | 1 refills | Status: DC

## 2021-08-29 MED ORDER — METHOCARBAMOL 500 MG PO TABS: 500.0000 mg | ORAL_TABLET | Freq: Three times a day (TID) | ORAL | 1 refills | Status: DC | PRN

## 2021-08-29 NOTE — Progress Notes (Addendum)
Established Patient Office Visit  Subjective:  Patient ID: Ian Ramos, male    DOB: 1946-04-19  Age: 75 y.o. MRN: [id number]  CC:  Chief Complaint  Patient presents with   Follow-up    3 month follow up, concerns about continued neck pains not feeling well all the time.     HPI Ian Ramos presents for follow-up of cervical myelopathy with ongoing pain and stiffness.  Saw neurosurgery last year and they do not feel as though further surgical procedures would benefit him.  Tells of stiffness in his posterior bilateral strap muscles low-dose Norco has not been helping that much.  Continues with Rybelsus for diabetes.  It causes nausea but that is past.  He does not feel as though Prozac has been helpful at all for mood elevation.  Cymbalta had caused nausea with vomiting.  Past Medical History:  Diagnosis Date   Arthritis    CAD in native artery    a. cath 06/2018 - Eccentric 75% proximal LAD; 75% OM2; occluded RV with collaterals   CKD (chronic kidney disease), stage III (HCC)    MI (myocardial infarction) (HCC)    Neuromuscular disorder (HCC)    Stroke [facility])     Past Surgical History:  Procedure Laterality Date   CAROTID PTA/STENT INTERVENTION N/A 06/27/2018   Procedure: CAROTID PTA/STENT INTERVENTION;  Surgeon: Sherren Kerns, MD;  Location: [facility] INVASIVE CV LAB;  Service: Cardiovascular;  Laterality: N/A;   LEFT HEART CATH AND CORONARY ANGIOGRAPHY N/A 06/05/2018   Procedure: LEFT HEART CATH AND CORONARY ANGIOGRAPHY;  Surgeon: [place], Peter M, MD;  Location: [facility] INVASIVE CV LAB;  Service: Cardiovascular;  Laterality: N/A;   POSTERIOR CERVICAL FUSION/FORAMINOTOMY N/A 05/23/2018   Procedure: Posterior Cervical Fusion with lateral mass fixation - C1 - C6 with laminectomy;  Surgeon: Julio Sicks, MD;  Location: [facility] OR;  Service: Neurosurgery;  Laterality: N/A;    Family History  Problem Relation Age of Onset   Hypertension Other    Heart failure Mother    CAD Father         s/p CABG in his 19s   Heart failure Father     Social History   Socioeconomic History   Marital status: Divorced    Spouse name: Not on file   Number of children: Not on file   Years of education: Not on file   Highest education level: Not on file  Occupational History   Not on file  Tobacco Use   Smoking status: Never   Smokeless tobacco: Current    Types: Chew  Vaping Use   Vaping Use: Never used  Substance and Sexual Activity   Alcohol use: No   Drug use: No   Sexual activity: Not on file  Other Topics Concern   Not on file  Social History Narrative   NA   Social Determinants of Health   Financial Resource Strain: Low Risk    Difficulty of Paying Living Expenses: Not hard at all  Food Insecurity: No Food Insecurity   Worried About Programme researcher, broadcasting/film/video in the Last Year: Never true   Ran Out of Food in the Last Year: Never true  Transportation Needs: No Transportation Needs   Lack of Transportation (Medical): No   Lack of Transportation (Non-Medical): No  Physical Activity: Insufficiently Active   Days of Exercise per Week: 3 days   Minutes of Exercise per Session: 20 min  Stress: No Stress Concern Present   Feeling of Stress :  Not at all  Social Connections: Moderately Isolated   Frequency of Communication with Friends and Family: More than three times a week   Frequency of Social Gatherings with Friends and Family: More than three times a week   Attends Religious Services: Never   Marine scientist or Organizations: No   Attends Music therapist: Never   Marital Status: Married  Human resources officer Violence: Not At Risk   Fear of Current or Ex-Partner: No   Emotionally Abused: No   Physically Abused: No   Sexually Abused: No    Outpatient Medications Prior to Visit  Medication Sig Dispense Refill   aspirin EC 81 MG tablet Take 81 mg by mouth daily.     atorvastatin (LIPITOR) 80 MG tablet TAKE 1 TABLET(80 MG) BY MOUTH DAILY 90 tablet 2    B Complex Vitamins (B COMPLEX-B12) TABS Take one tablet daily 90 tablet 1   carvedilol (COREG) 3.125 MG tablet TAKE 1 TABLET(3.125 MG) BY MOUTH TWICE DAILY WITH A MEAL 180 tablet 0   clopidogrel (PLAVIX) 75 MG tablet PLACE 1 TABLET INTO FEEDING TUBE DAILY 90 tablet 2   Semaglutide (RYBELSUS) 7 MG TABS Take 7 mg by mouth daily. 30 tablet 2   sildenafil (REVATIO) 20 MG tablet TAKE 3 TO 5 TABLETS DAILY 45 MINUTES PRIOR 25 tablet 2   FLUoxetine (PROZAC) 10 MG tablet Take 1 tablet (10 mg total) by mouth daily for 7 days, THEN 2 tablets (20 mg total) daily. 90 tablet 3   FEROSUL 325 (65 Fe) MG tablet TAKE 1 TABLET(325 MG) BY MOUTH DAILY WITH BREAKFAST (Patient not taking: Reported on 05/25/2021) 90 tablet 1   HYDROcodone-acetaminophen (NORCO) 5-325 MG tablet Take 1 tablet by mouth every 6 (six) hours as needed for moderate pain. 60 tablet 0   Facility-Administered Medications Prior to Visit  Medication Dose Route Frequency Provider Last Rate Last Admin   etomidate (AMIDATE) injection    Anesthesia Intra-op Myna Bright, CRNA   12 mg at 06/09/18 1445   succinylcholine (ANECTINE) injection    Anesthesia Intra-op Myna Bright, CRNA   100 mg at 06/09/18 1445    Allergies  Allergen Reactions   Cymbalta [Duloxetine Hcl] Nausea And Vomiting    ROS Review of Systems  Constitutional:  Negative for chills, diaphoresis, fatigue, fever and unexpected weight change.  HENT: Negative.    Eyes:  Negative for photophobia and visual disturbance.  Respiratory: Negative.    Cardiovascular: Negative.   Gastrointestinal: Negative.   Endocrine: Negative for polyphagia and polyuria.  Genitourinary:  Positive for frequency. Negative for urgency.  Musculoskeletal:  Positive for neck pain and neck stiffness.  Neurological:  Positive for weakness. Negative for speech difficulty.     Objective:    Physical Exam Vitals and nursing note reviewed.  Constitutional:      General: He is not in acute  distress.    Appearance: Normal appearance. He is not ill-appearing, toxic-appearing or diaphoretic.  HENT:     Head: Normocephalic and atraumatic.     Right Ear: External ear normal.     Left Ear: External ear normal.  Eyes:     General: No scleral icterus.       Right eye: No discharge.        Left eye: No discharge.     Conjunctiva/sclera: Conjunctivae normal.  Cardiovascular:     Rate and Rhythm: Normal rate and regular rhythm.  Pulmonary:     Effort: Pulmonary effort is  normal.     Breath sounds: Normal breath sounds.  Musculoskeletal:     Cervical back: Rigidity and spasms present. No tenderness or bony tenderness. No pain with movement. Decreased range of motion.       Back:  Skin:    General: Skin is warm and dry.  Neurological:     Mental Status: He is alert and oriented to person, place, and time.  Psychiatric:        Mood and Affect: Mood normal.        Behavior: Behavior normal.    BP 100/62 (BP Location: Right Arm, Patient Position: Sitting, Cuff Size: Normal)   Pulse 72   Temp 97.7 F (36.5 C) (Temporal)   Ht 5\' 7"  (1.702 m)   Wt 175 lb (79.4 kg)   SpO2 98%   BMI 27.41 kg/m  Wt Readings from Last 3 Encounters:  08/29/21 175 lb (79.4 kg)  05/25/21 190 lb 9.6 oz (86.5 kg)  07/22/20 178 lb 3.2 oz (80.8 kg)     Health Maintenance Due  Topic Date Due   FOOT EXAM  Never done   OPHTHALMOLOGY EXAM  Never done   Hepatitis C Screening  Never done   TETANUS/TDAP  Never done   Zoster Vaccines- Shingrix (1 of 2) Never done   Pneumonia Vaccine 55+ Years old (2 - PCV) 09/16/2018   URINE MICROALBUMIN  12/07/2020    There are no preventive care reminders to display for this patient.  Lab Results  Component Value Date   TSH 2.45 12/08/2019   Lab Results  Component Value Date   WBC 7.3 05/25/2021   HGB 13.2 05/25/2021   HCT 39.5 05/25/2021   MCV 89.4 05/25/2021   PLT 154.0 05/25/2021   Lab Results  Component Value Date   NA 141 08/29/2021   K 4.7  08/29/2021   CO2 24 08/29/2021   GLUCOSE 91 08/29/2021   BUN 19 08/29/2021   CREATININE 1.59 (H) 08/29/2021   BILITOT 0.7 05/25/2021   ALKPHOS 102 05/25/2021   AST 18 05/25/2021   ALT 19 05/25/2021   PROT 6.3 05/25/2021   ALBUMIN 3.8 05/25/2021   CALCIUM 9.1 08/29/2021   ANIONGAP 11 06/09/2019   GFR 41.74 (L) 08/29/2021   Lab Results  Component Value Date   CHOL 94 05/25/2021   Lab Results  Component Value Date   HDL 29.30 (L) 05/25/2021   Lab Results  Component Value Date   LDLCALC 39 05/25/2021   Lab Results  Component Value Date   TRIG 132.0 05/25/2021   Lab Results  Component Value Date   CHOLHDL 3 05/25/2021   Lab Results  Component Value Date   HGBA1C 6.3 08/29/2021      Assessment & Plan:   Problem List Items Addressed This Visit       Endocrine   Type 2 diabetes mellitus with stage 3b chronic kidney disease, without long-term current use of insulin ([place]) - Primary   Relevant Medications   empagliflozin (JARDIANCE) 10 MG TABS tablet   Other Relevant Orders   Hemoglobin A1c (Completed)   Basic metabolic panel (Completed)     Nervous and Auditory   Cervical myelopathy (HCC)   Relevant Medications   HYDROcodone-acetaminophen (NORCO) 7.5-325 MG tablet   methocarbamol (ROBAXIN) 500 MG tablet     Genitourinary   CKD (chronic kidney disease) stage 3, GFR 30-59 ml/min (HCC)   Relevant Orders   Basic metabolic panel (Completed)     Other  Chronic fatigue   Depression, major, single episode, moderate (HCC)   Relevant Medications   venlafaxine XR (EFFEXOR XR) 75 MG 24 hr capsule    Meds ordered this encounter  Medications   venlafaxine XR (EFFEXOR XR) 75 MG 24 hr capsule    Sig: Take 1 capsule (75 mg total) by mouth daily with breakfast.    Dispense:  90 capsule    Refill:  1   HYDROcodone-acetaminophen (NORCO) 7.5-325 MG tablet    Sig: Take 1 tablet by mouth every 6 (six) hours as needed for moderate pain.    Dispense:  90 tablet     Refill:  0   methocarbamol (ROBAXIN) 500 MG tablet    Sig: Take 1 tablet (500 mg total) by mouth every 8 (eight) hours as needed for muscle spasms.    Dispense:  60 tablet    Refill:  1   empagliflozin (JARDIANCE) 10 MG TABS tablet    Sig: Take 1 tablet (10 mg total) by mouth daily before breakfast.    Dispense:  30 tablet    Refill:  2     Follow-up: Return in about 2 months (around 10/29/2021), or if symptoms worsen or fail to improve.  Have increase Norco to 7.5 every 6 as needed and added Robaxin every 8 as needed.  Drowsy precautions given.  Advised that he is fall risk is increased.  We will discontinue Prozac and Effexor.  Mliss Sax, MD

## 2021-08-30 LAB — BASIC METABOLIC PANEL
BUN: 19 mg/dL (ref 6–23)
CO2: 24 mEq/L (ref 19–32)
Calcium: 9.1 mg/dL (ref 8.4–10.5)
Chloride: 107 mEq/L (ref 96–112)
Creatinine, Ser: 1.59 mg/dL — ABNORMAL HIGH (ref 0.40–1.50)
GFR: 41.74 mL/min — ABNORMAL LOW (ref >60.00)
Glucose, Bld: 91 mg/dL (ref 70–99)
Potassium: 4.7 mEq/L (ref 3.5–5.1)
Sodium: 141 mEq/L (ref 135–145)

## 2021-08-30 LAB — HEMOGLOBIN A1C: Hgb A1c MFr Bld: 6.3 % (ref 4.6–6.5)

## 2021-09-01 MED ORDER — EMPAGLIFLOZIN 10 MG PO TABS: 10.0000 mg | ORAL_TABLET | Freq: Every day | ORAL | 2 refills | Status: DC

## 2021-09-01 NOTE — Addendum Note (Signed)
Addended by: Andrez Grime on: 09/01/2021 07:38 AM   Modules accepted: Orders

## 2021-09-10 ENCOUNTER — Other Ambulatory Visit: Payer: Self-pay | Admitting: Family Medicine

## 2021-09-10 DIAGNOSIS — E1122 Type 2 diabetes mellitus with diabetic chronic kidney disease: Secondary | ICD-10-CM

## 2021-09-13 ENCOUNTER — Telehealth: Payer: Self-pay

## 2021-09-13 NOTE — Telephone Encounter (Signed)
Pt returning phone call regarding lab results, confirm with pt of lab results and pcp recommendations. Pt voiced understanding and will pick up med from pharmacy. sw, cma

## 2021-10-19 ENCOUNTER — Other Ambulatory Visit: Payer: Self-pay | Admitting: Family

## 2021-10-19 ENCOUNTER — Other Ambulatory Visit: Payer: Self-pay | Admitting: Family Medicine

## 2021-10-19 DIAGNOSIS — I2510 Atherosclerotic heart disease of native coronary artery without angina pectoris: Secondary | ICD-10-CM

## 2021-10-19 DIAGNOSIS — I10 Essential (primary) hypertension: Secondary | ICD-10-CM

## 2021-10-19 DIAGNOSIS — D649 Anemia, unspecified: Secondary | ICD-10-CM

## 2021-10-30 ENCOUNTER — Ambulatory Visit (INDEPENDENT_AMBULATORY_CARE_PROVIDER_SITE_OTHER): Payer: Medicare Other | Admitting: Family Medicine

## 2021-10-30 ENCOUNTER — Other Ambulatory Visit: Payer: Self-pay

## 2021-10-30 ENCOUNTER — Encounter: Payer: Self-pay | Admitting: Family Medicine

## 2021-10-30 VITALS — BP 112/68 | HR 72 | Temp 97.3°F | Ht 67.0 in | Wt 169.8 lb

## 2021-10-30 DIAGNOSIS — R5382 Chronic fatigue, unspecified: Secondary | ICD-10-CM | POA: Diagnosis not present

## 2021-10-30 DIAGNOSIS — E611 Iron deficiency: Secondary | ICD-10-CM

## 2021-10-30 DIAGNOSIS — F321 Major depressive disorder, single episode, moderate: Secondary | ICD-10-CM | POA: Diagnosis not present

## 2021-10-30 DIAGNOSIS — G959 Disease of spinal cord, unspecified: Secondary | ICD-10-CM

## 2021-10-30 DIAGNOSIS — N1832 Chronic kidney disease, stage 3b: Secondary | ICD-10-CM

## 2021-10-30 DIAGNOSIS — E1122 Type 2 diabetes mellitus with diabetic chronic kidney disease: Secondary | ICD-10-CM

## 2021-10-30 LAB — BASIC METABOLIC PANEL
BUN: 20 mg/dL (ref 6–23)
CO2: 27 mEq/L (ref 19–32)
Calcium: 8.9 mg/dL (ref 8.4–10.5)
Chloride: 105 mEq/L (ref 96–112)
Creatinine, Ser: 1.8 mg/dL — ABNORMAL HIGH (ref 0.40–1.50)
GFR: 35.93 mL/min — ABNORMAL LOW (ref >60.00)
Glucose, Bld: 119 mg/dL — ABNORMAL HIGH (ref 70–99)
Potassium: 4.2 mEq/L (ref 3.5–5.1)
Sodium: 140 mEq/L (ref 135–145)

## 2021-10-30 MED ORDER — VENLAFAXINE HCL ER 150 MG PO CP24: 150.0000 mg | ORAL_CAPSULE | Freq: Every day | ORAL | 2 refills | Status: DC

## 2021-10-30 NOTE — Progress Notes (Signed)
Established Patient Office Visit  Subjective:  Patient ID: Ian Ramos, male    DOB: 08/12/1946  Age: 76 y.o. MRN: [id number]  CC:  Chief Complaint  Patient presents with   Follow-up    2 month follow up, patient states that he seems to feel tired all the time and does not want to do anything. At night his hands are very cold.     HPI Ian Ramos presents for follow-up of depression, neck pain, diabetes, CKD.  Recently started empagliflozin for diabetes and CKD 3B.  He is tolerating the 75 mg of Effexor ER well.  Has not been having any pain in his neck or elsewhere since we increased the hydrocodone.  He has not been using it.  Significant other is going to see her daughter in [place].  He is not certain as to whether or not she will be back.  Past Medical History:  Diagnosis Date   Arthritis    CAD in native artery    a. cath 06/2018 - Eccentric 75% proximal LAD; 75% OM2; occluded RV with collaterals   CKD (chronic kidney disease), stage III (HCC)    MI (myocardial infarction) ([place])    Neuromuscular disorder ([place])    Stroke [facility])     Past Surgical History:  Procedure Laterality Date   CAROTID PTA/STENT INTERVENTION N/A 06/27/2018   Procedure: CAROTID PTA/STENT INTERVENTION;  Surgeon: Elam Dutch, MD;  Location: [place] CV LAB;  Service: Cardiovascular;  Laterality: N/A;   LEFT HEART CATH AND CORONARY ANGIOGRAPHY N/A 06/05/2018   Procedure: LEFT HEART CATH AND CORONARY ANGIOGRAPHY;  Surgeon: [place], Peter M, MD;  Location: [place] CV LAB;  Service: Cardiovascular;  Laterality: N/A;   POSTERIOR CERVICAL FUSION/FORAMINOTOMY N/A 05/23/2018   Procedure: Posterior Cervical Fusion with lateral mass fixation - C1 - C6 with laminectomy;  Surgeon: Earnie Larsson, MD;  Location: [place];  Service: Neurosurgery;  Laterality: N/A;    Family History  Problem Relation Age of Onset   Hypertension Other    Heart failure Mother    CAD Father        s/p CABG  in his 35s   Heart failure Father     Social History   Socioeconomic History   Marital status: Divorced    Spouse name: Not on file   Number of children: Not on file   Years of education: Not on file   Highest education level: Not on file  Occupational History   Not on file  Tobacco Use   Smoking status: Never   Smokeless tobacco: Current    Types: Chew  Vaping Use   Vaping Use: Never used  Substance and Sexual Activity   Alcohol use: No   Drug use: No   Sexual activity: Not on file  Other Topics Concern   Not on file  Social History Narrative   NA   Social Determinants of Health   Financial Resource Strain: Low Risk    Difficulty of Paying Living Expenses: Not hard at all  Food Insecurity: No Food Insecurity   Worried About Charity fundraiser in the Last Year: Never true   [place] in the Last Year: Never true  Transportation Needs: No Transportation Needs   Lack of Transportation (Medical): No   Lack of Transportation (Non-Medical): No  Physical Activity: Insufficiently Active   Days of Exercise per Week: 3 days   Minutes of Exercise per Session: 20 min  Stress: No  Stress Concern Present   Feeling of Stress : Not at all  Social Connections: Moderately Isolated   Frequency of Communication with Friends and Family: More than three times a week   Frequency of Social Gatherings with Friends and Family: More than three times a week   Attends Religious Services: Never   Marine scientist or Organizations: No   Attends Music therapist: Never   Marital Status: Married  Human resources officer Violence: Not At Risk   Fear of Current or Ex-Partner: No   Emotionally Abused: No   Physically Abused: No   Sexually Abused: No    Outpatient Medications Prior to Visit  Medication Sig Dispense Refill   aspirin EC 81 MG tablet Take 81 mg by mouth daily.     atorvastatin (LIPITOR) 80 MG tablet TAKE 1 TABLET(80 MG) BY  MOUTH DAILY 90 tablet 2   B Complex Vitamins (B COMPLEX-B12) TABS Take one tablet daily 90 tablet 1   carvedilol (COREG) 3.125 MG tablet TAKE 1 TABLET(3.125 MG) BY MOUTH TWICE DAILY WITH A MEAL 180 tablet 0   clopidogrel (PLAVIX) 75 MG tablet PLACE 1 TABLET INTO FEEDING TUBE DAILY 90 tablet 2   empagliflozin (JARDIANCE) 10 MG TABS tablet Take 1 tablet (10 mg total) by mouth daily before breakfast. 30 tablet 2   HYDROcodone-acetaminophen (NORCO) 7.5-325 MG tablet Take 1 tablet by mouth every 6 (six) hours as needed for moderate pain. 90 tablet 0   RYBELSUS 7 MG TABS TAKE 1 TABLET BY MOUTH DAILY 90 tablet 1   sildenafil (REVATIO) 20 MG tablet TAKE 3 TO 5 TABLETS DAILY 45 MINUTES PRIOR 25 tablet 2   venlafaxine XR (EFFEXOR XR) 75 MG 24 hr capsule Take 1 capsule (75 mg total) by mouth daily with breakfast. 90 capsule 1   FEROSUL 325 (65 Fe) MG tablet TAKE 1 TABLET(325 MG) BY MOUTH DAILY WITH BREAKFAST (Patient not taking: Reported on 10/30/2021) 90 tablet 1   methocarbamol (ROBAXIN) 500 MG tablet Take 1 tablet (500 mg total) by mouth every 8 (eight) hours as needed for muscle spasms. (Patient not taking: Reported on 10/30/2021) 60 tablet 1   Facility-Administered Medications Prior to Visit  Medication Dose Route Frequency Provider Last Rate Last Admin   etomidate (AMIDATE) injection    Anesthesia Intra-op Myna Bright, CRNA   12 mg at 06/09/18 1445   succinylcholine (ANECTINE) injection    Anesthesia Intra-op Myna Bright, CRNA   100 mg at 06/09/18 1445    Allergies  Allergen Reactions   Cymbalta [Duloxetine Hcl] Nausea And Vomiting    ROS Review of Systems  Constitutional:  Positive for fatigue. Negative for diaphoresis, fever and unexpected weight change.  HENT: Negative.    Eyes:  Negative for photophobia and visual disturbance.  Respiratory: Negative.    Cardiovascular: Negative.   Gastrointestinal: Negative.   Genitourinary: Negative.   Musculoskeletal:   Negative for neck pain and neck stiffness.  Skin:  Negative for color change and pallor.  Neurological:  Negative for speech difficulty and weakness.  Hematological:  Does not bruise/bleed easily.     Objective:    Physical Exam Vitals and nursing note reviewed.  Constitutional:      General: He is not in acute distress.    Appearance: Normal appearance. He is not ill-appearing, toxic-appearing or diaphoretic.  HENT:     Head: Normocephalic and atraumatic.     Right Ear: External ear normal.     Left Ear: External ear  normal.  Eyes:     General: No scleral icterus.       Right eye: No discharge.        Left eye: No discharge.     Conjunctiva/sclera: Conjunctivae normal.  Pulmonary:     Effort: Pulmonary effort is normal.  Musculoskeletal:     Cervical back: No rigidity or tenderness.  Lymphadenopathy:     Cervical: No cervical adenopathy.  Neurological:     Mental Status: He is alert and oriented to person, place, and time.  Psychiatric:        Mood and Affect: Mood normal.        Behavior: Behavior normal.    BP 112/68 (BP Location: Right Arm, Patient Position: Sitting, Cuff Size: Normal)    Pulse 72    Temp (!) 97.3 F (36.3 C) (Temporal)    Ht 5\' 7"  (1.702 m)    Wt 169 lb 12.8 oz (77 kg)    SpO2 96%    BMI 26.59 kg/m  Wt Readings from Last 3 Encounters:  10/30/21 169 lb 12.8 oz (77 kg)  08/29/21 175 lb (79.4 kg)  05/25/21 190 lb 9.6 oz (86.5 kg)     Health Maintenance Due  Topic Date Due   FOOT EXAM  Never done   OPHTHALMOLOGY EXAM  Never done   Hepatitis C Screening  Never done   TETANUS/TDAP  Never done   Zoster Vaccines- Shingrix (1 of 2) Never done   Pneumonia Vaccine 69+ Years old (2 - PCV) 09/16/2018   URINE MICROALBUMIN  12/07/2020    There are no preventive care reminders to display for this patient.  Lab Results  Component Value Date   TSH 2.45 12/08/2019   Lab Results  Component Value Date   WBC 7.3 05/25/2021   HGB 13.2 05/25/2021    HCT 39.5 05/25/2021   MCV 89.4 05/25/2021   PLT 154.0 05/25/2021   Lab Results  Component Value Date   NA 141 08/29/2021   K 4.7 08/29/2021   CO2 24 08/29/2021   GLUCOSE 91 08/29/2021   BUN 19 08/29/2021   CREATININE 1.59 (H) 08/29/2021   BILITOT 0.7 05/25/2021   ALKPHOS 102 05/25/2021   AST 18 05/25/2021   ALT 19 05/25/2021   PROT 6.3 05/25/2021   ALBUMIN 3.8 05/25/2021   CALCIUM 9.1 08/29/2021   ANIONGAP 11 06/09/2019   GFR 41.74 (L) 08/29/2021   Lab Results  Component Value Date   CHOL 94 05/25/2021   Lab Results  Component Value Date   HDL 29.30 (L) 05/25/2021   Lab Results  Component Value Date   LDLCALC 39 05/25/2021   Lab Results  Component Value Date   TRIG 132.0 05/25/2021   Lab Results  Component Value Date   CHOLHDL 3 05/25/2021   Lab Results  Component Value Date   HGBA1C 6.3 08/29/2021      Assessment & Plan:   Problem List Items Addressed This Visit       Endocrine   Type 2 diabetes mellitus with stage 3b chronic kidney disease, without long-term current use of insulin ([place]) - Primary   Relevant Orders   Basic metabolic panel     Nervous and Auditory   Cervical myelopathy (HCC)     Genitourinary   CKD (chronic kidney disease) stage 3, GFR 30-59 ml/min (HCC)   Relevant Orders   Basic metabolic panel     Other   Iron deficiency   Relevant Orders   Iron, TIBC  and Ferritin Panel   Chronic fatigue   Relevant Medications   venlafaxine XR (EFFEXOR XR) 150 MG 24 hr capsule   Depression, major, single episode, moderate (HCC)   Relevant Medications   venlafaxine XR (EFFEXOR XR) 150 MG 24 hr capsule    Meds ordered this encounter  Medications   venlafaxine XR (EFFEXOR XR) 150 MG 24 hr capsule    Sig: Take 1 capsule (150 mg total) by mouth daily with breakfast.    Dispense:  30 capsule    Refill:  2    Follow-up: Return in about 2 months (around 12/28/2021).   Increased Effexor 250 mg.  Continue Rybelsus and  Jardiance. Libby Maw, MD

## 2021-10-31 LAB — IRON,TIBC AND FERRITIN PANEL
%SAT: 26 % (calc) (ref 20–48)
Ferritin: 179 ng/mL (ref 24–380)
Iron: 68 ug/dL (ref 50–180)
TIBC: 261 mcg/dL (calc) (ref 250–425)

## 2021-11-09 ENCOUNTER — Encounter: Payer: Self-pay | Admitting: Family Medicine

## 2021-11-09 ENCOUNTER — Ambulatory Visit (INDEPENDENT_AMBULATORY_CARE_PROVIDER_SITE_OTHER): Payer: Medicare Other | Admitting: Family Medicine

## 2021-11-09 VITALS — Ht 67.0 in

## 2021-11-09 DIAGNOSIS — E1122 Type 2 diabetes mellitus with diabetic chronic kidney disease: Secondary | ICD-10-CM | POA: Diagnosis not present

## 2021-11-09 DIAGNOSIS — N1832 Chronic kidney disease, stage 3b: Secondary | ICD-10-CM

## 2021-11-09 DIAGNOSIS — F321 Major depressive disorder, single episode, moderate: Secondary | ICD-10-CM

## 2021-11-09 DIAGNOSIS — T887XXA Unspecified adverse effect of drug or medicament, initial encounter: Secondary | ICD-10-CM

## 2021-11-09 DIAGNOSIS — K921 Melena: Secondary | ICD-10-CM | POA: Insufficient documentation

## 2021-11-09 DIAGNOSIS — R5382 Chronic fatigue, unspecified: Secondary | ICD-10-CM

## 2021-11-09 NOTE — Progress Notes (Signed)
Established Patient Office Visit  Subjective:  Patient ID: Ian Ramos, male    DOB: [birth year]  Age: 76 y.o. MRN: [id number]  CC:  Chief Complaint  Patient presents with   Advice Only    Discuss labs, patient states that he had blood in stool few days ago.     HPI Ian Ramos presents for follow-up of diabetes, CKD and depression.  Unfortunately did not tolerate Effexor.  It is led to nausea.  He discontinued it.  Experienced 1 episode of blood with the stool.  Stools have been firm.  It has since cleared.  Having no issues taking Jardiance.  Denies urinary frequency with it.  Has increased his level of hydration.  Past Medical History:  Diagnosis Date   Arthritis    CAD in native artery    a. cath 06/2018 - Eccentric 75% proximal LAD; 75% OM2; occluded RV with collaterals   CKD (chronic kidney disease), stage III (HCC)    MI (myocardial infarction) (HCC)    Neuromuscular disorder (HCC)    Stroke [facility])     Past Surgical History:  Procedure Laterality Date   CAROTID PTA/STENT INTERVENTION N/A 06/27/2018   Procedure: CAROTID PTA/STENT INTERVENTION;  Surgeon: Sherren Kerns, MD;  Location: [facility] INVASIVE CV LAB;  Service: Cardiovascular;  Laterality: N/A;   LEFT HEART CATH AND CORONARY ANGIOGRAPHY N/A 06/05/2018   Procedure: LEFT HEART CATH AND CORONARY ANGIOGRAPHY;  Surgeon: Swazil[place], Peter M, MD;  Location: [facility] INVASIVE CV LAB;  Service: Cardiovascular;  Laterality: N/A;   POSTERIOR CERVICAL FUSION/FORAMINOTOMY N/A 05/23/2018   Procedure: Posterior Cervical Fusion with lateral mass fixation - C1 - C6 with laminectomy;  Surgeon: Julio Sicks, MD;  Location: [facility] OR;  Service: Neurosurgery;  Laterality: N/A;    Family History  Problem Relation Age of Onset   Hypertension Other    Heart failure Mother    CAD Father        s/p CABG in his 110s   Heart failure Father     Social History   Socioeconomic History   Marital status: Divorced    Spouse name: Not on file    Number of children: Not on file   Years of education: Not on file   Highest education level: Not on file  Occupational History   Not on file  Tobacco Use   Smoking status: Never   Smokeless tobacco: Current    Types: Chew  Vaping Use   Vaping Use: Never used  Substance and Sexual Activity   Alcohol use: No   Drug use: No   Sexual activity: Not on file  Other Topics Concern   Not on file  Social History Narrative   NA   Social Determinants of Health   Financial Resource Strain: Low Risk    Difficulty of Paying Living Expenses: Not hard at all  Food Insecurity: No Food Insecurity   Worried About Programme researcher, broadcasting/film/video in the Last Year: Never true   Ran Out of Food in the Last Year: Never true  Transportation Needs: No Transportation Needs   Lack of Transportation (Medical): No   Lack of Transportation (Non-Medical): No  Physical Activity: Insufficiently Active   Days of Exercise per Week: 3 days   Minutes of Exercise per Session: 20 min  Stress: No Stress Concern Present   Feeling of Stress : Not at all  Social Connections: Moderately Isolated   Frequency of Communication with Friends and Family: More than three times a  week   Frequency of Social Gatherings with Friends and Family: More than three times a week   Attends Religious Services: Never   Database administrator or Organizations: No   Attends Engineer, structural: Never   Marital Status: Married  Catering manager Violence: Not At Risk   Fear of Current or Ex-Partner: No   Emotionally Abused: No   Physically Abused: No   Sexually Abused: No    Outpatient Medications Prior to Visit  Medication Sig Dispense Refill   aspirin EC 81 MG tablet Take 81 mg by mouth daily.     atorvastatin (LIPITOR) 80 MG tablet TAKE 1 TABLET(80 MG) BY MOUTH DAILY 90 tablet 2   B Complex Vitamins (B COMPLEX-B12) TABS Take one tablet daily 90 tablet 1   carvedilol (COREG) 3.125 MG tablet TAKE 1 TABLET(3.125 MG) BY MOUTH TWICE  DAILY WITH A MEAL 180 tablet 0   clopidogrel (PLAVIX) 75 MG tablet PLACE 1 TABLET INTO FEEDING TUBE DAILY 90 tablet 2   empagliflozin (JARDIANCE) 10 MG TABS tablet Take 1 tablet (10 mg total) by mouth daily before breakfast. 30 tablet 2   HYDROcodone-acetaminophen (NORCO) 7.5-325 MG tablet Take 1 tablet by mouth every 6 (six) hours as needed for moderate pain. 90 tablet 0   RYBELSUS 7 MG TABS TAKE 1 TABLET BY MOUTH DAILY 90 tablet 1   sildenafil (REVATIO) 20 MG tablet TAKE 3 TO 5 TABLETS DAILY 45 MINUTES PRIOR 25 tablet 2   venlafaxine XR (EFFEXOR XR) 150 MG 24 hr capsule Take 1 capsule (150 mg total) by mouth daily with breakfast. (Patient not taking: Reported on 11/09/2021) 30 capsule 2   FEROSUL 325 (65 Fe) MG tablet TAKE 1 TABLET(325 MG) BY MOUTH DAILY WITH BREAKFAST (Patient not taking: Reported on 10/30/2021) 90 tablet 1   Facility-Administered Medications Prior to Visit  Medication Dose Route Frequency Provider Last Rate Last Admin   etomidate (AMIDATE) injection    Anesthesia Intra-op Lucinda Dell, CRNA   12 mg at 06/09/18 1445   succinylcholine (ANECTINE) injection    Anesthesia Intra-op Lucinda Dell, CRNA   100 mg at 06/09/18 1445    Allergies  Allergen Reactions   Cymbalta [Duloxetine Hcl] Nausea And Vomiting   Effexor Xr [Venlafaxine Hcl Er] Nausea And Vomiting    ROS Review of Systems  Constitutional: Negative.   HENT: Negative.    Respiratory: Negative.    Cardiovascular: Negative.   Gastrointestinal:  Positive for blood in stool and constipation. Negative for abdominal distention, abdominal pain, diarrhea, nausea, rectal pain and vomiting.  Genitourinary:  Negative for difficulty urinating, frequency and urgency.  Musculoskeletal:  Negative for gait problem and joint swelling.  Neurological:  Negative for speech difficulty and weakness.  Psychiatric/Behavioral: Negative.       Objective:    Physical Exam Vitals and nursing note reviewed.  Pulmonary:      Effort: Pulmonary effort is normal.  Neurological:     Mental Status: He is oriented to person, place, and time.  Psychiatric:        Mood and Affect: Mood normal.        Behavior: Behavior normal.    Ht  (1.702 m)    BMI 26.59 kg/m  Wt Readings from Last 3 Encounters:  10/30/21 169 lb 12.8 oz (77 kg)  08/29/21 175 lb (79.4 kg)  05/25/21 190 lb 9.6 oz (86.5 kg)     Health Maintenance Due  Topic Date Due   FOOT  EXAM  Never done   Hepatitis C Screening  Never done   Zoster Vaccines- Shingrix (1 of 2) Never done   URINE MICROALBUMIN  12/07/2020    There are no preventive care reminders to display for this patient.  Lab Results  Component Value Date   TSH 2.45 12/08/2019   Lab Results  Component Value Date   WBC 7.3 05/25/2021   HGB 13.2 05/25/2021   HCT 39.5 05/25/2021   MCV 89.4 05/25/2021   PLT 154.0 05/25/2021   Lab Results  Component Value Date   NA 140 10/30/2021   K 4.2 10/30/2021   CO2 27 10/30/2021   GLUCOSE 119 (H) 10/30/2021   BUN 20 10/30/2021   CREATININE 1.80 (H) 10/30/2021   BILITOT 0.7 05/25/2021   ALKPHOS 102 05/25/2021   AST 18 05/25/2021   ALT 19 05/25/2021   PROT 6.3 05/25/2021   ALBUMIN 3.8 05/25/2021   CALCIUM 8.9 10/30/2021   ANIONGAP 11 06/09/2019   GFR 35.93 (L) 10/30/2021   Lab Results  Component Value Date   CHOL 94 05/25/2021   Lab Results  Component Value Date   HDL 29.30 (L) 05/25/2021   Lab Results  Component Value Date   LDLCALC 39 05/25/2021   Lab Results  Component Value Date   TRIG 132.0 05/25/2021   Lab Results  Component Value Date   CHOLHDL 3 05/25/2021   Lab Results  Component Value Date   HGBA1C 6.3 08/29/2021      Assessment & Plan:   Problem List Items Addressed This Visit       Endocrine   Type 2 diabetes mellitus with stage 3b chronic kidney disease, without long-term current use of insulin (HCC)     Genitourinary   Stage 3b chronic kidney disease (HCC)     Other   Chronic  fatigue   Depression, major, single episode, moderate (HCC) - Primary   Medication side effect   Hematochezia    No orders of the defined types were placed in this encounter.   Follow-up: No follow-ups on file.  Has discontinued Effexor.  Continue all other medications.  Maintain hydration.  Follow-up in 1 month for recheck.   Virtual Visit via Telephone Note  I connected with Ian Ramos on 11/09/21 at  3:00 PM EST by telephone and verified that I am speaking with the correct person using two identifiers.  Location: Patient: home alone.  Provider: work   I discussed the limitations, risks, security and privacy concerns of performing an evaluation and management service by telephone and the availability of in person appointments. I also discussed with the patient that there may be a patient responsible charge related to this service. The patient expressed understanding and agreed to proceed.   History of Present Illness:    Observations/Objective:   Assessment and Plan:   Follow Up Instructions:    I discussed the assessment and treatment plan with the patient. The patient was provided an opportunity to ask questions and all were answered. The patient agreed with the plan and demonstrated an understanding of the instructions.   The patient was advised to call back or seek an in-person evaluation if the symptoms worsen or if the condition fails to improve as anticipated.  I provided 25 minutes of non-face-to-face time during this encounter.   Mliss Sax, MD   Mliss Sax, MD  Interactive video and audio telecommunications were attempted between myself and the patient. However they failed due to the  patient having technical difficulties or not having access to video capability. We continued and completed with audio only.

## 2021-11-10 ENCOUNTER — Other Ambulatory Visit: Payer: Self-pay

## 2021-11-11 ENCOUNTER — Other Ambulatory Visit: Payer: Self-pay | Admitting: Family Medicine

## 2021-11-11 DIAGNOSIS — F341 Dysthymic disorder: Secondary | ICD-10-CM

## 2021-12-10 ENCOUNTER — Other Ambulatory Visit: Payer: Self-pay | Admitting: Family Medicine

## 2021-12-10 DIAGNOSIS — E1122 Type 2 diabetes mellitus with diabetic chronic kidney disease: Secondary | ICD-10-CM

## 2021-12-28 ENCOUNTER — Encounter: Payer: Self-pay | Admitting: Family Medicine

## 2021-12-28 ENCOUNTER — Ambulatory Visit (INDEPENDENT_AMBULATORY_CARE_PROVIDER_SITE_OTHER): Payer: Medicare Other | Admitting: Family Medicine

## 2021-12-28 VITALS — BP 102/66 | HR 73 | Temp 96.9°F | Ht 67.0 in | Wt 164.6 lb

## 2021-12-28 DIAGNOSIS — E1122 Type 2 diabetes mellitus with diabetic chronic kidney disease: Secondary | ICD-10-CM | POA: Diagnosis not present

## 2021-12-28 DIAGNOSIS — N1832 Chronic kidney disease, stage 3b: Secondary | ICD-10-CM

## 2021-12-28 DIAGNOSIS — I2583 Coronary atherosclerosis due to lipid rich plaque: Secondary | ICD-10-CM

## 2021-12-28 DIAGNOSIS — F321 Major depressive disorder, single episode, moderate: Secondary | ICD-10-CM

## 2021-12-28 DIAGNOSIS — R5382 Chronic fatigue, unspecified: Secondary | ICD-10-CM | POA: Diagnosis not present

## 2021-12-28 DIAGNOSIS — E611 Iron deficiency: Secondary | ICD-10-CM

## 2021-12-28 DIAGNOSIS — I2510 Atherosclerotic heart disease of native coronary artery without angina pectoris: Secondary | ICD-10-CM

## 2021-12-28 MED ORDER — EMPAGLIFLOZIN 10 MG PO TABS: ORAL_TABLET | ORAL | 3 refills | Status: DC

## 2021-12-28 MED ORDER — FLUOXETINE HCL 20 MG PO CAPS: 20.0000 mg | ORAL_CAPSULE | Freq: Every day | ORAL | 3 refills | Status: DC

## 2021-12-28 MED ORDER — RYBELSUS 7 MG PO TABS: 1.0000 | ORAL_TABLET | Freq: Every day | ORAL | 1 refills | Status: DC

## 2021-12-28 NOTE — Progress Notes (Signed)
? ?Established Patient Office Visit ? ?Subjective:  ?Patient ID: Ian Ramos, male    DOB: 11-11-1945  Age: 76 y.o. MRN: [id number] ? ?CC:  ?Chief Complaint  ?Patient presents with  ? Follow-up  ?  2 month follow up, seems to be tired a lot.   ? ? ?HPI ?Ian Ramos presents for follow-up of CKD, type 2 diabetes, depression, elevated cholesterol with history of coronary artery disease and fatigue.  We have discontinued the venlafaxine secondary to side effect.  He did not understanding continued it.  We switched him back to fluoxetine that he had previously taken.  He has been out of that.  Fatigue persists.  [place]  Not exercising regularly.  Continues to take Jardiance and Rybelsus without issue.  Continues low-dose carvedilol.  Denies lightheadedness or dizziness.  Has not been taking his iron pills. ? ?Past Medical History:  ?Diagnosis Date  ? Arthritis   ? CAD in native artery   ? a. cath 06/2018 - Eccentric 75% proximal LAD; 75% OM2; occluded RV with collaterals  ? CKD (chronic kidney disease), stage III (HCC)   ? MI (myocardial infarction) (HCC)   ? Neuromuscular disorder (HCC)   ? Stroke [facility])   ? ? ?Past Surgical History:  ?Procedure Laterality Date  ? CAROTID PTA/STENT INTERVENTION N/A 06/27/2018  ? Procedure: CAROTID PTA/STENT INTERVENTION;  Surgeon: Sherren Kerns, MD;  Location: [facility] INVASIVE CV LAB;  Service: Cardiovascular;  Laterality: N/A;  ? LEFT HEART CATH AND CORONARY ANGIOGRAPHY N/A 06/05/2018  ? Procedure: LEFT HEART CATH AND CORONARY ANGIOGRAPHY;  Surgeon: Swaziland, Peter M, MD;  Location: [facility] INVASIVE CV LAB;  Service: Cardiovascular;  Laterality: N/A;  ? POSTERIOR CERVICAL FUSION/FORAMINOTOMY N/A 05/23/2018  ? Procedure: Posterior Cervical Fusion with lateral mass fixation - C1 - C6 with laminectomy;  Surgeon: Julio Sicks, MD;  Location: [facility] OR;  Service: Neurosurgery;  Laterality: N/A;  ? ? ?Family History  ?Problem Relation Age of Onset  ? Hypertension Other   ?  Heart failure Mother   ? CAD Father   ?     s/p CABG in his 76s  ? Heart failure Father   ? ? ?Social History  ? ?Socioeconomic History  ? Marital status: Divorced  ?  Spouse name: Not on file  ? Number of children: Not on file  ? Years of education: Not on file  ? Highest education level: Not on file  ?Occupational History  ? Not on file  ?Tobacco Use  ? Smoking status: Never  ? Smokeless tobacco: Current  ?  Types: Chew  ?Vaping Use  ? Vaping Use: Never used  ?Substance and Sexual Activity  ? Alcohol use: No  ? Drug use: No  ? Sexual activity: Not on file  ?Other Topics Concern  ? Not on file  ?Social History Narrative  ? NA  ? ?Social Determinants of Health  ? ?Financial Resource Strain: Low Risk   ? Difficulty of Paying Living Expenses: Not hard at all  ?Food Insecurity: No Food Insecurity  ? Worried About Programme researcher, broadcasting/film/video in the Last Year: Never true  ? Ran Out of Food in the Last Year: Never true  ?Transportation Needs: No Transportation Needs  ? Lack of Transportation (Medical): No  ? Lack of Transportation (Non-Medical): No  ?Physical Activity: Insufficiently Active  ? Days of Exercise per Week: 3 days  ? Minutes of Exercise per Session: 20 min  ?Stress: No Stress Concern  Present  ? Feeling of Stress : Not at all  ?Social Connections: Moderately Isolated  ? Frequency of Communication with Friends and Family: More than three times a week  ? Frequency of Social Gatherings with Friends and Family: More than three times a week  ? Attends Religious Services: Never  ? Active Member of Clubs or Organizations: No  ? Attends Banker Meetings: Never  ? Marital Status: Married  ?Intimate Partner Violence: Not At Risk  ? Fear of Current or Ex-Partner: No  ? Emotionally Abused: No  ? Physically Abused: No  ? Sexually Abused: No  ? ? ?Outpatient Medications Prior to Visit  ?Medication Sig Dispense Refill  ? aspirin EC 81 MG tablet Take 81 mg by mouth daily.    ? atorvastatin (LIPITOR) 80 MG tablet TAKE 1  TABLET(80 MG) BY MOUTH DAILY 90 tablet 2  ? B Complex Vitamins (B COMPLEX-B12) TABS Take one tablet daily 90 tablet 1  ? carvedilol (COREG) 3.125 MG tablet TAKE 1 TABLET(3.125 MG) BY MOUTH TWICE DAILY WITH A MEAL 180 tablet 0  ? clopidogrel (PLAVIX) 75 MG tablet PLACE 1 TABLET INTO FEEDING TUBE DAILY 90 tablet 2  ? HYDROcodone-acetaminophen (NORCO) 7.5-325 MG tablet Take 1 tablet by mouth every 6 (six) hours as needed for moderate pain. 90 tablet 0  ? FLUoxetine (PROZAC) 10 MG tablet TAKE 1 TABLET(10 MG) BY MOUTH DAILY FOR 7 DAYS THEN TAKE 2 TABLETS(20 MG) BY MOUTH DAILY 90 tablet 0  ? JARDIANCE 10 MG TABS tablet TAKE 1 TABLET(10 MG) BY MOUTH DAILY BEFORE BREAKFAST 90 tablet 3  ? RYBELSUS 7 MG TABS TAKE 1 TABLET BY MOUTH DAILY 90 tablet 1  ? venlafaxine XR (EFFEXOR-XR) 75 MG 24 hr capsule Take 75 mg by mouth daily.    ? sildenafil (REVATIO) 20 MG tablet TAKE 3 TO 5 TABLETS DAILY 45 MINUTES PRIOR (Patient not taking: Reported on 12/28/2021) 25 tablet 2  ? ?Facility-Administered Medications Prior to Visit  ?Medication Dose Route Frequency Provider Last Rate Last Admin  ? etomidate (AMIDATE) injection    Anesthesia Intra-op Lucinda Dell, CRNA   12 mg at 06/09/18 1445  ? succinylcholine (ANECTINE) injection    Anesthesia Intra-op Lucinda Dell, CRNA   100 mg at 06/09/18 1445  ? ? ?Allergies  ?Allergen Reactions  ? Cymbalta [Duloxetine Hcl] Nausea And Vomiting  ? Effexor Xr [Venlafaxine Hcl Er] Nausea And Vomiting  ? ? ?ROS ?Review of Systems  ?Constitutional:  Negative for chills, diaphoresis, fatigue, fever and unexpected weight change.  ?HENT: Negative.    ?Eyes:  Negative for photophobia and visual disturbance.  ?Gastrointestinal:  Negative for abdominal pain, anal bleeding and blood in stool.  ?Genitourinary:  Negative for difficulty urinating, frequency, hematuria and urgency.  ?Musculoskeletal:  Positive for neck pain.  ?Neurological:  Negative for speech difficulty, weakness and light-headedness.  ? ?   ?Objective:  ?  ?Physical Exam ?Vitals and nursing note reviewed.  ?Constitutional:   ?   General: He is not in acute distress. ?   Appearance: Normal appearance. He is not ill-appearing, toxic-appearing or diaphoretic.  ?HENT:  ?   Head: Normocephalic and atraumatic.  ?   Right Ear: External ear normal.  ?   Left Ear: External ear normal.  ?   Mouth/Throat:  ?   Pharynx: Oropharynx is clear. No oropharyngeal exudate or posterior oropharyngeal erythema.  ?Eyes:  ?   General:     ?   Right eye: No discharge.     ?  Left eye: No discharge.  ?   Extraocular Movements: Extraocular movements intact.  ?   Conjunctiva/sclera: Conjunctivae normal.  ?   Pupils: Pupils are equal, round, and reactive to light.  ?Cardiovascular:  ?   Rate and Rhythm: Normal rate and regular rhythm.  ?Pulmonary:  ?   Effort: Pulmonary effort is normal.  ?   Breath sounds: Normal breath sounds.  ?Abdominal:  ?   General: Bowel sounds are normal.  ?Musculoskeletal:  ?   Cervical back: No rigidity or tenderness.  ?Lymphadenopathy:  ?   Cervical: No cervical adenopathy.  ?Skin: ?   General: Skin is warm and dry.  ?Neurological:  ?   Mental Status: He is alert and oriented to person, place, and time.  ? ? ?BP 102/66 (BP Location: Right Arm, Patient Position: Sitting, Cuff Size: Large)   Pulse 73   Temp (!) 96.9 ?F (36.1 ?C) (Temporal)   Ht 5\' 7"  (1.702 m)   Wt 164 lb 9.6 oz (74.7 kg)   SpO2 97%   BMI 25.78 kg/m?  ?Wt Readings from Last 3 Encounters:  ?12/28/21 164 lb 9.6 oz (74.7 kg)  ?10/30/21 169 lb 12.8 oz (77 kg)  ?08/29/21 175 lb (79.4 kg)  ? ? ? ?Health Maintenance Due  ?Topic Date Due  ? FOOT EXAM  Never done  ? Hepatitis C Screening  Never done  ? URINE MICROALBUMIN  12/07/2020  ? ? ?There are no preventive care reminders to display for this patient. ? ?Lab Results  ?Component Value Date  ? TSH 2.45 12/08/2019  ? ?Lab Results  ?Component Value Date  ? WBC 7.3 05/25/2021  ? HGB 13.2 05/25/2021  ? HCT 39.5 05/25/2021  ? MCV 89.4  05/25/2021  ? PLT 154.0 05/25/2021  ? ?Lab Results  ?Component Value Date  ? NA 140 10/30/2021  ? K 4.2 10/30/2021  ? CO2 27 10/30/2021  ? GLUCOSE 119 (H) 10/30/2021  ? BUN 20 10/30/2021  ? CREATININE 1.80 (H) 01/30/2

## 2021-12-29 LAB — COMPREHENSIVE METABOLIC PANEL
ALT: 27 U/L (ref 0–53)
AST: 22 U/L (ref 0–37)
Albumin: 4.1 g/dL (ref 3.5–5.2)
Alkaline Phosphatase: 91 U/L (ref 39–117)
BUN: 23 mg/dL (ref 6–23)
CO2: 24 mEq/L (ref 19–32)
Calcium: 9 mg/dL (ref 8.4–10.5)
Chloride: 106 mEq/L (ref 96–112)
Creatinine, Ser: 1.81 mg/dL — ABNORMAL HIGH (ref 0.40–1.50)
GFR: 35.65 mL/min — ABNORMAL LOW (ref >60.00)
Glucose, Bld: 109 mg/dL — ABNORMAL HIGH (ref 70–99)
Potassium: 4 mEq/L (ref 3.5–5.1)
Sodium: 139 mEq/L (ref 135–145)
Total Bilirubin: 0.7 mg/dL (ref 0.2–1.2)
Total Protein: 6.5 g/dL (ref 6.0–8.3)

## 2021-12-29 LAB — URINALYSIS, ROUTINE W REFLEX MICROSCOPIC
Bilirubin Urine: NEGATIVE
Hgb urine dipstick: NEGATIVE
Ketones, ur: NEGATIVE
Leukocytes,Ua: NEGATIVE
Nitrite: NEGATIVE
RBC / HPF: NONE SEEN (ref 0–?)
Specific Gravity, Urine: 1.02 (ref 1.000–1.030)
Total Protein, Urine: NEGATIVE
Urine Glucose: >=1000 — AB
Urobilinogen, UA: 1 (ref 0.0–1.0)
pH: 6 (ref 5.0–8.0)

## 2021-12-29 LAB — MICROALBUMIN / CREATININE URINE RATIO
Creatinine,U: 163.3 mg/dL
Microalb Creat Ratio: 0.8 mg/g (ref 0.0–30.0)
Microalb, Ur: 1.2 mg/dL (ref 0.0–1.9)

## 2021-12-29 LAB — CBC
HCT: 44 % (ref 39.0–52.0)
Hemoglobin: 14.6 g/dL (ref 13.0–17.0)
MCHC: 33.3 g/dL (ref 30.0–36.0)
MCV: 92.7 fl (ref 78.0–100.0)
Platelets: 168 10*3/uL (ref 150.0–400.0)
RBC: 4.74 Mil/uL (ref 4.22–5.81)
RDW: 13.2 % (ref 11.5–15.5)
WBC: 8.5 10*3/uL (ref 4.0–10.5)

## 2021-12-29 LAB — LDL CHOLESTEROL, DIRECT: Direct LDL: 49 mg/dL

## 2022-01-17 ENCOUNTER — Other Ambulatory Visit: Payer: Self-pay | Admitting: Family Medicine

## 2022-01-17 DIAGNOSIS — I2510 Atherosclerotic heart disease of native coronary artery without angina pectoris: Secondary | ICD-10-CM

## 2022-01-17 DIAGNOSIS — I10 Essential (primary) hypertension: Secondary | ICD-10-CM

## 2022-02-16 ENCOUNTER — Other Ambulatory Visit: Payer: Self-pay | Admitting: Family Medicine

## 2022-02-16 DIAGNOSIS — F321 Major depressive disorder, single episode, moderate: Secondary | ICD-10-CM

## 2022-02-27 ENCOUNTER — Other Ambulatory Visit: Payer: Self-pay | Admitting: Family Medicine

## 2022-02-27 DIAGNOSIS — F321 Major depressive disorder, single episode, moderate: Secondary | ICD-10-CM

## 2022-02-27 DIAGNOSIS — R5382 Chronic fatigue, unspecified: Secondary | ICD-10-CM

## 2022-03-30 ENCOUNTER — Ambulatory Visit (INDEPENDENT_AMBULATORY_CARE_PROVIDER_SITE_OTHER): Payer: Medicare Other | Admitting: Family Medicine

## 2022-03-30 ENCOUNTER — Encounter: Payer: Self-pay | Admitting: Family Medicine

## 2022-03-30 VITALS — BP 118/72 | HR 67 | Temp 98.0°F | Ht 67.0 in | Wt 161.2 lb

## 2022-03-30 DIAGNOSIS — N1832 Chronic kidney disease, stage 3b: Secondary | ICD-10-CM

## 2022-03-30 DIAGNOSIS — F321 Major depressive disorder, single episode, moderate: Secondary | ICD-10-CM | POA: Diagnosis not present

## 2022-03-30 DIAGNOSIS — M542 Cervicalgia: Secondary | ICD-10-CM | POA: Diagnosis not present

## 2022-03-30 DIAGNOSIS — E1122 Type 2 diabetes mellitus with diabetic chronic kidney disease: Secondary | ICD-10-CM | POA: Diagnosis not present

## 2022-03-30 DIAGNOSIS — Z9889 Other specified postprocedural states: Secondary | ICD-10-CM

## 2022-03-30 MED ORDER — HYDROCODONE-ACETAMINOPHEN 5-325 MG PO TABS: ORAL_TABLET | ORAL | 0 refills | Status: DC

## 2022-03-30 NOTE — Progress Notes (Signed)
Established Patient Office Visit  Subjective   Patient ID: Ian Ramos, male    DOB: 1946/03/18  Age: 76 y.o. MRN: [id number]  Chief Complaint  Patient presents with   Follow-up    3 month follow, concerns about neck pain seems to be falling more than normal.     HPI follow-up of chronic neck pain with history of cervical myelopathy and spondylosis.  Upper extremity weakness status post cervical surgery and CVA.  Chronic depression.  Type 2 diabetes stage IIIb CKD.  Accompanied by his daughter is concerned about overmedication.  He is following several times at home.  It has been when he has been overall walking on something.  Says that his arms give way.  He denies lightheadedness or palpitations prior to the falls.    Review of Systems  Constitutional: Negative.   HENT: Negative.    Eyes:  Negative for blurred vision, discharge and redness.  Respiratory: Negative.    Cardiovascular: Negative.   Gastrointestinal:  Negative for abdominal pain.  Genitourinary: Negative.   Musculoskeletal:  Positive for falls and neck pain. Negative for myalgias.  Skin:  Negative for rash.  Neurological:  Positive for weakness. Negative for tingling and loss of consciousness.  Endo/Heme/Allergies:  Negative for polydipsia.  Psychiatric/Behavioral:  Positive for depression.       03/30/2022    3:54 PM 12/28/2021    2:40 PM 10/30/2021    2:03 PM  Depression screen PHQ 2/9  Decreased Interest 2 0 0  Down, Depressed, Hopeless 2 0 0  PHQ - 2 Score 4 0 0  Altered sleeping 0    Tired, decreased energy 2    Change in appetite 0    Feeling bad or failure about yourself  0    Trouble concentrating 0    Moving slowly or fidgety/restless 0    Suicidal thoughts 0    PHQ-9 Score 6    Difficult doing work/chores Somewhat difficult          Objective:     BP 118/72 (BP Location: Right Arm, Patient Position: Sitting, Cuff Size: Normal)   Pulse 67   Temp 98 F (36.7 C) (Temporal)   Ht 5'  7" (1.702 m)   Wt 161 lb 3.2 oz (73.1 kg)   SpO2 98%   BMI 25.25 kg/m    Physical Exam Constitutional:      General: He is not in acute distress.    Appearance: Normal appearance. He is not ill-appearing, toxic-appearing or diaphoretic.  HENT:     Head: Normocephalic and atraumatic.     Right Ear: External ear normal.     Left Ear: External ear normal.  Eyes:     General: No scleral icterus.       Right eye: No discharge.        Left eye: No discharge.     Extraocular Movements: Extraocular movements intact.     Conjunctiva/sclera: Conjunctivae normal.  Cardiovascular:     Rate and Rhythm: Normal rate and regular rhythm.  Pulmonary:     Effort: Pulmonary effort is normal. No respiratory distress.     Breath sounds: Normal breath sounds.  Musculoskeletal:     Cervical back: No rigidity or tenderness. Decreased range of motion.  Skin:    General: Skin is warm and dry.  Neurological:     Mental Status: He is alert and oriented to person, place, and time.     Motor: Weakness present.  Psychiatric:  Mood and Affect: Mood normal.        Behavior: Behavior normal.      No results found for any visits on 03/30/22.    The ASCVD Risk score (Arnett DK, et al., 2019) failed to calculate for the following reasons:   The patient has a prior MI or stroke diagnosis    Assessment & Plan:   Problem List Items Addressed This Visit       Endocrine   Type 2 diabetes mellitus with stage 3b chronic kidney disease, without long-term current use of insulin (HCC)   Relevant Orders   Basic metabolic panel   Hemoglobin A1c   CBC     Genitourinary   Stage 3b chronic kidney disease (HCC)   Relevant Orders   Basic metabolic panel     Other   Depression, major, single episode, moderate (HCC) - Primary   Neck pain with history of cervical spinal surgery   Relevant Medications   HYDROcodone-acetaminophen (NORCO) 5-325 MG tablet   Other Relevant Orders   Ambulatory referral  to Sports Medicine    Return in about 6 weeks (around 05/11/2022).  We will hold fluoxetine and continue venlafaxine to 225 mg daily.  Norco nightly as needed.  Sports medicine referral.   Mliss Sax, MD

## 2022-03-31 LAB — BASIC METABOLIC PANEL
BUN/Creatinine Ratio: 13 (calc) (ref 6–22)
BUN: 22 mg/dL (ref 7–25)
CO2: 22 mmol/L (ref 20–32)
Calcium: 8.6 mg/dL (ref 8.6–10.3)
Chloride: 106 mmol/L (ref 98–110)
Creat: 1.76 mg/dL — ABNORMAL HIGH (ref 0.70–1.28)
Glucose, Bld: 84 mg/dL (ref 65–99)
Potassium: 4.5 mmol/L (ref 3.5–5.3)
Sodium: 138 mmol/L (ref 135–146)

## 2022-03-31 LAB — CBC
HCT: 41.5 % (ref 38.5–50.0)
Hemoglobin: 14.4 g/dL (ref 13.2–17.1)
MCH: 31 pg (ref 27.0–33.0)
MCHC: 34.7 g/dL (ref 32.0–36.0)
MCV: 89.4 fL (ref 80.0–100.0)
MPV: 10.2 fL (ref 7.5–12.5)
Platelets: 170 10*3/uL (ref 140–400)
RBC: 4.64 10*6/uL (ref 4.20–5.80)
RDW: 12.9 % (ref 11.0–15.0)
WBC: 9 10*3/uL (ref 3.8–10.8)

## 2022-03-31 LAB — HEMOGLOBIN A1C
Hgb A1c MFr Bld: 5.7 % of total Hgb — ABNORMAL HIGH (ref <5.7)
Mean Plasma Glucose: 117 mg/dL
eAG (mmol/L): 6.5 mmol/L

## 2022-04-17 ENCOUNTER — Other Ambulatory Visit: Payer: Self-pay | Admitting: Family Medicine

## 2022-04-17 DIAGNOSIS — D649 Anemia, unspecified: Secondary | ICD-10-CM

## 2022-05-01 NOTE — Progress Notes (Unsigned)
[facility] [address] [place] [address] Phone: [phone] Subjective:   Bruce Donath, am serving as a scribe for Dr. Antoine Primas.  I'm seeing this patient by the request  of:  Mliss Sax, MD  CC: Neck pain   OEV:[id number]  Ian Ramos is a 76 y.o. male coming in with complaint of neck pain. Patient had surgery in 2019 for his nerve in neck and a few days later he had a stroke and heart attack possibly unrelated to surgery as he was not seeing a primary care provider regularly. Since surgery,  neck pain has not improved. Pain is constant and achy but can be sharp at times. Fell on his L arm and is unable to move shoulder without assistance of other arm. Was falling about once a week but hasn't fallen since 6 weeks ago.    Pain medication prescribed by PCP is no longer working. He takes it at night so that he can drive during the day. Would like to discuss medication management and opinion on if he should have surgery as it has been recommended that he have another.   Patient recently did fall and hurt his left shoulder.  Having difficulty even lifting it and having more difficulty with daily activities.         Past Medical History:  Diagnosis Date   Arthritis    CAD in native artery    a. cath 06/2018 - Eccentric 75% proximal LAD; 75% OM2; occluded RV with collaterals   CKD (chronic kidney disease), stage III (HCC)    MI (myocardial infarction) (HCC)    Neuromuscular disorder (HCC)    Stroke [facility])    Past Surgical History:  Procedure Laterality Date   CAROTID PTA/STENT INTERVENTION N/A 06/27/2018   Procedure: CAROTID PTA/STENT INTERVENTION;  Surgeon: Sherren Kerns, MD;  Location: [facility] INVASIVE CV LAB;  Service: Cardiovascular;  Laterality: N/A;   LEFT HEART CATH AND CORONARY ANGIOGRAPHY N/A 06/05/2018   Procedure: LEFT HEART CATH AND CORONARY ANGIOGRAPHY;  Surgeon: Swaziland, Peter M, MD;  Location: [facility] INVASIVE CV  LAB;  Service: Cardiovascular;  Laterality: N/A;   POSTERIOR CERVICAL FUSION/FORAMINOTOMY N/A 05/23/2018   Procedure: Posterior Cervical Fusion with lateral mass fixation - C1 - C6 with laminectomy;  Surgeon: Julio Sicks, MD;  Location: [facility] OR;  Service: Neurosurgery;  Laterality: N/A;   Social History   Socioeconomic History   Marital status: Divorced    Spouse name: Not on file   Number of children: Not on file   Years of education: Not on file   Highest education level: Not on file  Occupational History   Not on file  Tobacco Use   Smoking status: Never   Smokeless tobacco: Current    Types: Chew  Vaping Use   Vaping Use: Never used  Substance and Sexual Activity   Alcohol use: No   Drug use: No   Sexual activity: Not on file  Other Topics Concern   Not on file  Social History Narrative   NA   Social Determinants of Health   Financial Resource Strain: Low Risk  (05/23/2021)   Overall Financial Resource Strain (CARDIA)    Difficulty of Paying Living Expenses: Not hard at all  Food Insecurity: No Food Insecurity (05/23/2021)   Hunger Vital Sign    Worried About Running Out of Food in the Last Year: Never true    Ran Out of Food in the Last Year: Never  true  Transportation Needs: No Transportation Needs (05/23/2021)   PRAPARE - Administrator, Civil Service (Medical): No    Lack of Transportation (Non-Medical): No  Physical Activity: Insufficiently Active (05/23/2021)   Exercise Vital Sign    Days of Exercise per Week: 3 days    Minutes of Exercise per Session: 20 min  Stress: No Stress Concern Present (05/23/2021)   [employer] of Occupational Health - Occupational Stress Questionnaire    Feeling of Stress : Not at all  Social Connections: Moderately Isolated (05/23/2021)   Social Connection and Isolation Panel [NHANES]    Frequency of Communication with Friends and Family: More than three times a week    Frequency of Social Gatherings with Friends and  Family: More than three times a week    Attends Religious Services: Never    Database administrator or Organizations: No    Attends Engineer, structural: Never    Marital Status: Married   Allergies  Allergen Reactions   Cymbalta [Duloxetine Hcl] Nausea And Vomiting   Effexor Xr [Venlafaxine Hcl Er] Nausea And Vomiting   Family History  Problem Relation Age of Onset   Hypertension Other    Heart failure Mother    CAD Father        s/p CABG in his 27s   Heart failure Father     Current Outpatient Medications (Endocrine & Metabolic):    empagliflozin (JARDIANCE) 10 MG TABS tablet, TAKE 1 TABLET(10 MG) BY MOUTH DAILY BEFORE BREAKFAST   Semaglutide (RYBELSUS) 7 MG TABS, Take 1 tablet by mouth daily.   Current Outpatient Medications (Cardiovascular):    atorvastatin (LIPITOR) 80 MG tablet, TAKE 1 TABLET(80 MG) BY MOUTH DAILY   carvedilol (COREG) 3.125 MG tablet, TAKE 1 TABLET(3.125 MG) BY MOUTH TWICE DAILY WITH A MEAL   sildenafil (REVATIO) 20 MG tablet, TAKE 3 TO 5 TABLETS DAILY 45 MINUTES PRIOR     Current Outpatient Medications (Analgesics):    aspirin EC 81 MG tablet, Take 81 mg by mouth daily.   HYDROcodone-acetaminophen (NORCO) 5-325 MG tablet, One at night as needed for pain   Facility-Administered Medications Ordered in Other Visits (Analgesics):    etomidate (AMIDATE) injection  Current Outpatient Medications (Hematological):    clopidogrel (PLAVIX) 75 MG tablet, PLACE 1 TABLET INTO FEEDING TUBE DAILY   ferrous sulfate (FEROSUL) 325 (65 FE) MG tablet, Take 1 every other day.   Current Outpatient Medications (Other):    B Complex Vitamins (B COMPLEX-B12) TABS, Take one tablet daily   FLUoxetine (PROZAC) 20 MG capsule, Take 1 capsule (20 mg total) by mouth daily.   venlafaxine XR (EFFEXOR-XR) 150 MG 24 hr capsule, TAKE 1 CAPSULE(150 MG) BY MOUTH DAILY WITH BREAKFAST   venlafaxine XR (EFFEXOR-XR) 75 MG 24 hr capsule, TAKE 1 CAPSULE(75 MG) BY MOUTH DAILY  WITH BREAKFAST   Facility-Administered Medications Ordered in Other Visits (Other):    succinylcholine (ANECTINE) injection No current facility-administered medications for this visit.   Reviewed prior external information including notes and imaging from  primary care provider this includes what patient's primary care is most recent note where they did see him and has been given Norco.  Was helping him with sleep at night. As well as notes that were available from care everywhere and other healthcare systems.  Reviewed patient's labs which show his kidneys seem to be stable.  Past medical history, social, surgical and family history all reviewed in electronic medical record.  No pertanent information  unless stated regarding to the chief complaint.   Review of Systems:  No headache, visual changes, nausea, vomiting, diarrhea, constipation, dizziness, abdominal pain, skin rash, fevers, chills, night sweats, weight loss, swollen lymph nodes,  joint swelling, chest pain, shortness of breath, mood changes. POSITIVE muscle aches, body aches  Objective  Blood pressure 108/68, pulse 78, height 5\' 7"  (1.702 m), weight 161 lb (73 kg), SpO2 98 %.   General: No apparent distress alert and oriented x3 mood and affect normal, dressed appropriately.  Flat affect.  Patient is accompanied with daughter HEENT: Pupils equal, extraocular movements intact  Respiratory: Patient's speak in full sentences and does not appear short of breath  Cardiovascular: No lower extremity edema, non tender, no erythema  Patient does have weakness in grip strength.  Patient has 2 out of 5 strength of the left upper extremity.  Patient has limited range of motion in all planes.  Positive drop arm sign. Neck exam has no flexion or extension.  Minimal rotation as well.  Tender to palpation in the paraspinal musculature of the cervical spine.    Impression and Recommendations:     The above documentation has been reviewed and  is accurate and complete , DO

## 2022-05-02 ENCOUNTER — Ambulatory Visit: Payer: Self-pay

## 2022-05-02 ENCOUNTER — Encounter: Payer: Self-pay | Admitting: Family Medicine

## 2022-05-02 ENCOUNTER — Ambulatory Visit (INDEPENDENT_AMBULATORY_CARE_PROVIDER_SITE_OTHER): Payer: Medicare Other

## 2022-05-02 ENCOUNTER — Ambulatory Visit: Payer: Medicare Other | Admitting: Family Medicine

## 2022-05-02 VITALS — BP 108/68 | HR 78 | Ht 67.0 in | Wt 161.0 lb

## 2022-05-02 DIAGNOSIS — M47812 Spondylosis without myelopathy or radiculopathy, cervical region: Secondary | ICD-10-CM | POA: Diagnosis not present

## 2022-05-02 DIAGNOSIS — M542 Cervicalgia: Secondary | ICD-10-CM | POA: Diagnosis not present

## 2022-05-02 DIAGNOSIS — R269 Unspecified abnormalities of gait and mobility: Secondary | ICD-10-CM

## 2022-05-02 DIAGNOSIS — M25512 Pain in left shoulder: Secondary | ICD-10-CM | POA: Diagnosis not present

## 2022-05-02 DIAGNOSIS — Z9889 Other specified postprocedural states: Secondary | ICD-10-CM | POA: Diagnosis not present

## 2022-05-02 NOTE — Assessment & Plan Note (Addendum)
Fell 6 weeks ago.  Patient does have a positive drop arm sign.  Patient does have significant weakness of the rotator cuff with and unable to even do empty can.  I do feel that there is going to be a full-thickness tear noted.  Due to this being within the 6 weeks I do think that there is a possibility that acutely rupture could be potentially a surgical candidate still.  Did discuss with patient and his daughter about apprehension of having another surgery.  We discussed that I feel he should have the capability but we do need to see if surgery is even necessary depending on the MRI.  We did agree and will follow-up with me after imaging to discuss further.  Total time discussing with patient, reviewing patient's CT scan of the neck and patient's intraoperative reports and primary care notes  62 minutes

## 2022-05-02 NOTE — Assessment & Plan Note (Signed)
Mild shuffling gait, could be neurologic.  We will continue to monitor.

## 2022-05-02 NOTE — Patient Instructions (Addendum)
Gabapentin 200mg  at night Stay away from narcotics Vit D 2000IU daily  Tart cherry extract 1200mg  at night MRI [facility] [phone] Xray today See me again in 4 weeks

## 2022-05-02 NOTE — Assessment & Plan Note (Signed)
Continues pain.  Was on pain meds.  Would like to try gabapentin. Warned of side effects and that it may make him tired.  Do agree with the Effexor that could help with some of the chronic pain as well.  Patient has had difficulty since his stroke.  Patient does have some atrophy of the upper extremities bilaterally that is likely contributed to some nerve irritation or nerve injury.  Could consider the possibility of nerve conduction test on hold at the moment.  We will start with gabapentin and some vitamin supplementations to see how patient responds.  Follow-up again in 4 to 6 weeks.

## 2022-05-07 ENCOUNTER — Other Ambulatory Visit: Payer: Self-pay | Admitting: Family Medicine

## 2022-05-07 ENCOUNTER — Telehealth: Payer: Self-pay | Admitting: Family Medicine

## 2022-05-07 DIAGNOSIS — I2510 Atherosclerotic heart disease of native coronary artery without angina pectoris: Secondary | ICD-10-CM

## 2022-05-07 DIAGNOSIS — I6523 Occlusion and stenosis of bilateral carotid arteries: Secondary | ICD-10-CM

## 2022-05-07 NOTE — Telephone Encounter (Signed)
Patient called in regards to the Gabapentin that Dr Katrinka Blazing discussed. Can this be sent in to [facility] on [address].

## 2022-05-08 ENCOUNTER — Other Ambulatory Visit: Payer: Self-pay

## 2022-05-08 MED ORDER — GABAPENTIN 100 MG PO CAPS: 200.0000 mg | ORAL_CAPSULE | Freq: Every day | ORAL | 0 refills | Status: DC

## 2022-05-08 NOTE — Telephone Encounter (Signed)
Rx filled

## 2022-05-11 ENCOUNTER — Telehealth: Payer: Self-pay | Admitting: Family Medicine

## 2022-05-11 ENCOUNTER — Ambulatory Visit (INDEPENDENT_AMBULATORY_CARE_PROVIDER_SITE_OTHER): Payer: Medicare Other | Admitting: Family Medicine

## 2022-05-11 ENCOUNTER — Encounter: Payer: Self-pay | Admitting: Family Medicine

## 2022-05-11 VITALS — BP 102/66 | HR 71 | Temp 97.5°F | Ht 67.0 in | Wt 165.4 lb

## 2022-05-11 DIAGNOSIS — N1832 Chronic kidney disease, stage 3b: Secondary | ICD-10-CM

## 2022-05-11 DIAGNOSIS — M542 Cervicalgia: Secondary | ICD-10-CM | POA: Diagnosis not present

## 2022-05-11 DIAGNOSIS — F321 Major depressive disorder, single episode, moderate: Secondary | ICD-10-CM

## 2022-05-11 DIAGNOSIS — Z9889 Other specified postprocedural states: Secondary | ICD-10-CM

## 2022-05-11 DIAGNOSIS — E1122 Type 2 diabetes mellitus with diabetic chronic kidney disease: Secondary | ICD-10-CM | POA: Diagnosis not present

## 2022-05-11 DIAGNOSIS — G959 Disease of spinal cord, unspecified: Secondary | ICD-10-CM

## 2022-05-11 NOTE — Progress Notes (Signed)
Established Patient Office Visit  Subjective   Patient ID: Ian Ramos, male    DOB: 07-Oct-1945  Age: 76 y.o. MRN: [id number]  Chief Complaint  Patient presents with   Follow-up    6 week follow up, no concerns.     HPI is done well off of Prozac.  There have been no lightheaded presyncopal spells.  Hemoglobin A1c is down to 5.7.  Per sports medicine he is no longer taking the Norco in favor of tart cherry 3000 mg and 200 mg.  Dr. Katrinka Blazing is concerned rotator cuff injury in the lieu of stroke sequelae/cervical myelopathy.    Review of Systems  Constitutional: Negative.   HENT: Negative.    Eyes:  Negative for blurred vision, discharge and redness.  Respiratory: Negative.    Cardiovascular: Negative.   Gastrointestinal:  Negative for abdominal pain.  Genitourinary: Negative.   Musculoskeletal: Negative.  Negative for myalgias.  Skin:  Negative for rash.  Neurological:  Negative for tingling, loss of consciousness and weakness.  Endo/Heme/Allergies:  Negative for polydipsia.      05/11/2022    2:10 PM 03/30/2022    3:54 PM 12/28/2021    2:40 PM  Depression screen PHQ 2/9  Decreased Interest 0 2 0  Down, Depressed, Hopeless 0 2 0  PHQ - 2 Score 0 4 0  Altered sleeping  0   Tired, decreased energy  2   Change in appetite  0   Feeling bad or failure about yourself   0   Trouble concentrating  0   Moving slowly or fidgety/restless  0   Suicidal thoughts  0   PHQ-9 Score  6   Difficult doing work/chores Not difficult at all Somewhat difficult        Objective:     BP 102/66 (BP Location: Right Arm, Patient Position: Sitting, Cuff Size: Normal)   Pulse 71   Temp (!) 97.5 F (36.4 C) (Temporal)   Ht 5\' 7"  (1.702 m)   Wt 165 lb 6.4 oz (75 kg)   SpO2 96%   BMI 25.91 kg/m  BP Readings from Last 3 Encounters:  05/11/22 102/66  05/02/22 108/68  03/30/22 118/72   Wt Readings from Last 3 Encounters:  05/11/22 165 lb 6.4 oz (75 kg)  05/02/22 161 lb (73 kg)   03/30/22 161 lb 3.2 oz (73.1 kg)      Physical Exam Constitutional:      General: He is not in acute distress.    Appearance: Normal appearance. He is not ill-appearing, toxic-appearing or diaphoretic.  HENT:     Head: Normocephalic and atraumatic.     Right Ear: External ear normal.     Left Ear: External ear normal.  Eyes:     General: No scleral icterus.       Right eye: No discharge.        Left eye: No discharge.     Extraocular Movements: Extraocular movements intact.     Conjunctiva/sclera: Conjunctivae normal.  Cardiovascular:     Rate and Rhythm: Normal rate and regular rhythm.  Pulmonary:     Effort: Pulmonary effort is normal. No respiratory distress.     Breath sounds: Normal breath sounds.  Abdominal:     Tenderness: There is no guarding.  Skin:    General: Skin is warm and dry.  Neurological:     Mental Status: He is alert and oriented to person, place, and time.  Psychiatric:  Mood and Affect: Mood normal.        Behavior: Behavior normal.      No results found for any visits on 05/11/22.    The ASCVD Risk score (Arnett DK, et al., 2019) failed to calculate for the following reasons:   The patient has a prior MI or stroke diagnosis    Assessment & Plan:   Problem List Items Addressed This Visit   None   Return in about 3 months (around 08/11/2022), or We will hold Rybelsus 7 mg daily venlafaxine 75 mg.  Continue venlafaxine 150 mg..  Continue empagliflozin 10 mg.  Advised patient to stay well-hydrated and avoid the heat of the day but otherwise be as active as possible.  Avoid the heat of the day working outside in the early morning and evening hours possible.  Rest immediately with any sense of lightheadedness.  Continue to hold Norco in favor of gabapentin tart cherry.   Mliss Sax, MD

## 2022-05-11 NOTE — Telephone Encounter (Signed)
Patients daughter aware that he would need to be fasting.

## 2022-05-11 NOTE — Telephone Encounter (Signed)
Pt's daughter, Leta Jungling is wondering if her dad,the pt is going to need to fast for his upcoming appointment on 08/10/22 at 2:20. If so, she can change the time of this appointment to an earlier one. Leta Jungling [phone]

## 2022-05-13 ENCOUNTER — Other Ambulatory Visit: Payer: Self-pay | Admitting: Family Medicine

## 2022-05-13 DIAGNOSIS — F321 Major depressive disorder, single episode, moderate: Secondary | ICD-10-CM

## 2022-05-13 DIAGNOSIS — R5382 Chronic fatigue, unspecified: Secondary | ICD-10-CM

## 2022-05-16 ENCOUNTER — Telehealth: Payer: Self-pay | Admitting: Family Medicine

## 2022-05-16 NOTE — Telephone Encounter (Signed)
Left message for patient to call back and schedule Medicare Annual Wellness Visit (AWV).   Please offer to do virtually or by telephone.  Left office number and my jabber #[phone].  Last AWV:05/23/2021  Please schedule at anytime with Nurse Health Advisor.   

## 2022-05-23 ENCOUNTER — Other Ambulatory Visit: Payer: Self-pay | Admitting: Family Medicine

## 2022-05-23 DIAGNOSIS — N5201 Erectile dysfunction due to arterial insufficiency: Secondary | ICD-10-CM

## 2022-05-24 ENCOUNTER — Ambulatory Visit: Payer: Medicare Other | Admitting: Family Medicine

## 2022-05-25 ENCOUNTER — Other Ambulatory Visit: Payer: Self-pay | Admitting: Family Medicine

## 2022-05-25 DIAGNOSIS — N5201 Erectile dysfunction due to arterial insufficiency: Secondary | ICD-10-CM

## 2022-06-05 ENCOUNTER — Ambulatory Visit
Admission: RE | Admit: 2022-06-05 | Discharge: 2022-06-05 | Disposition: A | Discharge status: Home / Self Care | Payer: Medicare Other | Source: Ambulatory Visit | Attending: Family Medicine | Admitting: Family Medicine

## 2022-06-05 ENCOUNTER — Telehealth: Payer: Self-pay | Admitting: Family Medicine

## 2022-06-05 DIAGNOSIS — S46012A Strain of muscle(s) and tendon(s) of the rotator cuff of left shoulder, initial encounter: Secondary | ICD-10-CM | POA: Diagnosis not present

## 2022-06-05 DIAGNOSIS — M25512 Pain in left shoulder: Secondary | ICD-10-CM

## 2022-06-05 NOTE — Progress Notes (Signed)
[facility] [address] [place] [address] Phone: [phone] Subjective:   INadine Counts, am serving as a scribe for Dr. Antoine Primas.  I'm seeing this patient by the request  of:  Mliss Sax, MD  CC: Left shoulder pain follow-up  [id number]  05/02/2022 Fell 6 weeks ago.  Patient does have a positive drop arm sign.  Patient does have significant weakness of the rotator cuff with and unable to even do empty can.  I do feel that there is going to be a full-thickness tear noted.  Due to this being within the 6 weeks I do think that there is a possibility that acutely rupture could be potentially a surgical candidate still.  Did discuss with patient and his daughter about apprehension of having another surgery.  We discussed that I feel he should have the capability but we do need to see if surgery is even necessary depending on the MRI.  We did agree and will follow-up with me after imaging to discuss further.  Total time discussing with patient, reviewing patient's CT scan of the neck and patient's intraoperative reports and primary care notes  62 minutes  Updated 06/08/2022 Ethelle Lyon is a 76 y.o. male coming in with complaint of shoulder and neck pain . states he is better than the first time he came in , meds seemed to help has more enrgey and feels like doing htings now  Xray IMPRESSION: Glenohumeral and AC joint degenerative changes. Mild calcific tendinosis. No other abnormalities.  IMPRESSION: 1. No acute bony abnormality of the cervical spine. 2. Stable posterior fusion hardware and multilevel degenerative changes.  MRI IMPRESSION: 1. Chronic large full-thickness retracted tears of the supraspinatus, infraspinatus and subscapularis tendons. 2. Severe chronic fatty atrophy of the rotator cuff muscles. 3. Torn and retracted long head biceps tendon. 4. Advanced glenohumeral joint degenerative changes.       Past  Medical History:  Diagnosis Date   Arthritis    CAD in native artery    a. cath 06/2018 - Eccentric 75% proximal LAD; 75% OM2; occluded RV with collaterals   CKD (chronic kidney disease), stage III (HCC)    MI (myocardial infarction) (HCC)    Neuromuscular disorder (HCC)    Stroke [facility])    Past Surgical History:  Procedure Laterality Date   CAROTID PTA/STENT INTERVENTION N/A 06/27/2018   Procedure: CAROTID PTA/STENT INTERVENTION;  Surgeon: Sherren Kerns, MD;  Location: [facility] INVASIVE CV LAB;  Service: Cardiovascular;  Laterality: N/A;   LEFT HEART CATH AND CORONARY ANGIOGRAPHY N/A 06/05/2018   Procedure: LEFT HEART CATH AND CORONARY ANGIOGRAPHY;  Surgeon: Swaziland, Peter M, MD;  Location: [facility] INVASIVE CV LAB;  Service: Cardiovascular;  Laterality: N/A;   POSTERIOR CERVICAL FUSION/FORAMINOTOMY N/A 05/23/2018   Procedure: Posterior Cervical Fusion with lateral mass fixation - C1 - C6 with laminectomy;  Surgeon: Julio Sicks, MD;  Location: [facility] OR;  Service: Neurosurgery;  Laterality: N/A;   Social History   Socioeconomic History   Marital status: Divorced    Spouse name: Not on file   Number of children: Not on file   Years of education: Not on file   Highest education level: Not on file  Occupational History   Not on file  Tobacco Use   Smoking status: Never   Smokeless tobacco: Current    Types: Chew  Vaping Use   Vaping Use: Never used  Substance and Sexual Activity   Alcohol use: No  Drug use: No   Sexual activity: Not on file  Other Topics Concern   Not on file  Social History Narrative   NA   Social Determinants of Health   Financial Resource Strain: Low Risk  (05/23/2021)   Overall Financial Resource Strain (CARDIA)    Difficulty of Paying Living Expenses: Not hard at all  Food Insecurity: No Food Insecurity (05/23/2021)   Hunger Vital Sign    Worried About Running Out of Food in the Last Year: Never true    Ran Out of Food in the Last Year: Never true  Transportation  Needs: No Transportation Needs (05/23/2021)   PRAPARE - Administrator, Civil Service (Medical): No    Lack of Transportation (Non-Medical): No  Physical Activity: Insufficiently Active (05/23/2021)   Exercise Vital Sign    Days of Exercise per Week: 3 days    Minutes of Exercise per Session: 20 min  Stress: No Stress Concern Present (05/23/2021)   [employer] of Occupational Health - Occupational Stress Questionnaire    Feeling of Stress : Not at all  Social Connections: Moderately Isolated (05/23/2021)   Social Connection and Isolation Panel [NHANES]    Frequency of Communication with Friends and Family: More than three times a week    Frequency of Social Gatherings with Friends and Family: More than three times a week    Attends Religious Services: Never    Database administrator or Organizations: No    Attends Engineer, structural: Never    Marital Status: Married   Allergies  Allergen Reactions   Cymbalta [Duloxetine Hcl] Nausea And Vomiting   Effexor Xr [Venlafaxine Hcl Er] Nausea And Vomiting   Family History  Problem Relation Age of Onset   Hypertension Other    Heart failure Mother    CAD Father        s/p CABG in his 88s   Heart failure Father     Current Outpatient Medications (Endocrine & Metabolic):    empagliflozin (JARDIANCE) 10 MG TABS tablet, TAKE 1 TABLET(10 MG) BY MOUTH DAILY BEFORE BREAKFAST   Current Outpatient Medications (Cardiovascular):    atorvastatin (LIPITOR) 80 MG tablet, TAKE 1 TABLET(80 MG) BY MOUTH DAILY   carvedilol (COREG) 3.125 MG tablet, TAKE 1 TABLET(3.125 MG) BY MOUTH TWICE DAILY WITH A MEAL   sildenafil (REVATIO) 20 MG tablet, TAKE 3 TO 5 TABLETS BY MOUTH DAILY 45 MINUTES PRIOR     Current Outpatient Medications (Analgesics):    aspirin EC 81 MG tablet, Take 81 mg by mouth daily.   HYDROcodone-acetaminophen (NORCO) 5-325 MG tablet, One at night as needed for pain   Facility-Administered Medications Ordered  in Other Visits (Analgesics):    etomidate (AMIDATE) injection  Current Outpatient Medications (Hematological):    clopidogrel (PLAVIX) 75 MG tablet, PLACE 1 TABLET INTO FEEDING TUBE DAILY   ferrous sulfate (FEROSUL) 325 (65 FE) MG tablet, Take 1 every other day.   Current Outpatient Medications (Other):    B Complex Vitamins (B COMPLEX-B12) TABS, Take one tablet daily   gabapentin (NEURONTIN) 100 MG capsule, Take 2 capsules (200 mg total) by mouth at bedtime.   TART CHERRY PO, Take by mouth.   venlafaxine XR (EFFEXOR-XR) 150 MG 24 hr capsule, TAKE 1 CAPSULE(150 MG) BY MOUTH DAILY WITH BREAKFAST   Facility-Administered Medications Ordered in Other Visits (Other):    succinylcholine (ANECTINE) injection No current facility-administered medications for this visit.   Reviewed prior external information including notes and imaging  from  primary care provider As well as notes that were available from care everywhere and other healthcare systems.  Past medical history, social, surgical and family history all reviewed in electronic medical record.  No pertanent information unless stated regarding to the chief complaint.   Review of Systems:  No headache, visual changes, nausea, vomiting, diarrhea, constipation, dizziness, abdominal pain, skin rash, fevers, chills, night sweats, weight loss, swollen lymph nodes, body aches, joint swelling, chest pain, shortness of breath, mood changes. POSITIVE muscle aches  Objective  Blood pressure 132/78, pulse 70, height 5\' 7"  (1.702 m), weight 170 lb (77.1 kg), SpO2 98 %.   General: No apparent distress alert and oriented x3 mood and affect normal, dressed appropriately.  HEENT: Pupils equal, extraocular movements intact  Respiratory: Patient's speak in full sentences and does not appear short of breath  Cardiovascular: No lower extremity edema, non tender, no erythema  Arthritic changes of multiple joints.  Patient still has significant weakness  with a positive drop arm sign noted.  This is in the left shoulder.  Patient does have weakness of the right rotator cuff as well with some mild crepitus.    Impression and Recommendations:

## 2022-06-05 NOTE — Telephone Encounter (Signed)
Left message for patient to call back and schedule Medicare Annual Wellness Visit (AWV).   Please offer to do virtually or by telephone.  Left office number and my jabber #[phone].  Last AWV:05/23/2021  Please schedule at anytime with Nurse Health Advisor.   

## 2022-06-08 ENCOUNTER — Ambulatory Visit (INDEPENDENT_AMBULATORY_CARE_PROVIDER_SITE_OTHER): Payer: Medicare Other | Admitting: Family Medicine

## 2022-06-08 VITALS — BP 132/78 | HR 70 | Ht 67.0 in | Wt 170.0 lb

## 2022-06-08 DIAGNOSIS — M12812 Other specific arthropathies, not elsewhere classified, left shoulder: Secondary | ICD-10-CM | POA: Insufficient documentation

## 2022-06-08 DIAGNOSIS — M75102 Unspecified rotator cuff tear or rupture of left shoulder, not specified as traumatic: Secondary | ICD-10-CM

## 2022-06-08 DIAGNOSIS — M25512 Pain in left shoulder: Secondary | ICD-10-CM

## 2022-06-08 NOTE — Assessment & Plan Note (Signed)
Significant left-sided rotator cuff arthropathy.  Patient does have arthritic changes noted.  MRI that did show the patient did have a chronic large full-thickness tear with significant atrophy and fat deposition within the rotator cuff muscles.  Because of this and the advanced arthritic changes I do not think that there is a chance for any rotator cuff repair.  I think the patient's best possibility would be to see if any type of replacement surgery would be able to improve patient's quality of life with increasing range of motion.  I would like to refer patient to orthopedic surgery to discuss surgical options.  We discussed other treatment options such as injections but I do not think it would help significantly with the range of motion the patient is looking for.  Patient was accompanied with daughter.  Answered all their questions.  Total time with the 33 minutes

## 2022-06-08 NOTE — Patient Instructions (Signed)
Referral  to Dr. Chandler  

## 2022-06-20 DIAGNOSIS — M19212 Secondary osteoarthritis, left shoulder: Secondary | ICD-10-CM | POA: Diagnosis not present

## 2022-07-04 ENCOUNTER — Other Ambulatory Visit: Payer: Self-pay | Admitting: Family Medicine

## 2022-07-05 ENCOUNTER — Telehealth: Payer: Self-pay | Admitting: Family Medicine

## 2022-07-05 NOTE — Telephone Encounter (Signed)
Left message for patient to call back and schedule Medicare Annual Wellness Visit (AWV).   Please offer to do virtually or by telephone.  Left office number and my jabber #[phone].  Last AWV:05/23/2021  Please schedule at anytime with Nurse Health Advisor.   

## 2022-07-30 ENCOUNTER — Telehealth: Payer: Self-pay | Admitting: Family Medicine

## 2022-07-30 NOTE — Telephone Encounter (Signed)
Left message for patient to call back and schedule Medicare Annual Wellness Visit (AWV).   Please offer to do virtually or by telephone.  Left office number and my jabber [phone].  Last AWV:05/23/2021  Please schedule at anytime with Nurse Health Advisor.

## 2022-08-03 ENCOUNTER — Other Ambulatory Visit: Payer: Self-pay | Admitting: Family Medicine

## 2022-08-03 DIAGNOSIS — R5382 Chronic fatigue, unspecified: Secondary | ICD-10-CM

## 2022-08-03 DIAGNOSIS — F321 Major depressive disorder, single episode, moderate: Secondary | ICD-10-CM

## 2022-08-10 ENCOUNTER — Ambulatory Visit (INDEPENDENT_AMBULATORY_CARE_PROVIDER_SITE_OTHER): Payer: Medicare Other | Admitting: Family Medicine

## 2022-08-10 ENCOUNTER — Encounter: Payer: Self-pay | Admitting: Family Medicine

## 2022-08-10 ENCOUNTER — Ambulatory Visit: Payer: Medicare Other | Admitting: Family Medicine

## 2022-08-10 VITALS — BP 110/64 | HR 65 | Temp 97.3°F | Ht 67.0 in | Wt 180.8 lb

## 2022-08-10 DIAGNOSIS — M542 Cervicalgia: Secondary | ICD-10-CM

## 2022-08-10 DIAGNOSIS — Z9889 Other specified postprocedural states: Secondary | ICD-10-CM

## 2022-08-10 DIAGNOSIS — N1832 Chronic kidney disease, stage 3b: Secondary | ICD-10-CM | POA: Diagnosis not present

## 2022-08-10 DIAGNOSIS — R7303 Prediabetes: Secondary | ICD-10-CM | POA: Diagnosis not present

## 2022-08-10 DIAGNOSIS — E875 Hyperkalemia: Secondary | ICD-10-CM

## 2022-08-10 DIAGNOSIS — E611 Iron deficiency: Secondary | ICD-10-CM | POA: Diagnosis not present

## 2022-08-10 DIAGNOSIS — F321 Major depressive disorder, single episode, moderate: Secondary | ICD-10-CM

## 2022-08-10 DIAGNOSIS — R7309 Other abnormal glucose: Secondary | ICD-10-CM

## 2022-08-10 DIAGNOSIS — I2583 Coronary atherosclerosis due to lipid rich plaque: Secondary | ICD-10-CM

## 2022-08-10 DIAGNOSIS — E559 Vitamin D deficiency, unspecified: Secondary | ICD-10-CM | POA: Diagnosis not present

## 2022-08-10 DIAGNOSIS — Z23 Encounter for immunization: Secondary | ICD-10-CM | POA: Diagnosis not present

## 2022-08-10 DIAGNOSIS — I2510 Atherosclerotic heart disease of native coronary artery without angina pectoris: Secondary | ICD-10-CM

## 2022-08-10 DIAGNOSIS — E538 Deficiency of other specified B group vitamins: Secondary | ICD-10-CM

## 2022-08-10 MED ORDER — GABAPENTIN 100 MG PO CAPS: 200.0000 mg | ORAL_CAPSULE | Freq: Every day | ORAL | 1 refills | Status: DC

## 2022-08-10 NOTE — Progress Notes (Signed)
Established Patient Office Visit  Subjective   Patient ID: Ian Ramos, male    DOB: 07-Jun-1946  Age: 76 y.o. MRN: [id number]  Chief Complaint  Patient presents with   Follow-up    3 month follow up, no concerns. Patient had breakfast at 6am.     HPI here with his daughter for follow-up.  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  Far more active than he used to be.  Has discontinued Norco.  No further falls.  Has not even come close to falling or feels as though he might fall.  Dr. Katrinka Blazing was able to help some.  He is doing exercises to improve his shoulder.  Has not been taking iron recently.  Continues empagliflozin for CKD    Review of Systems  Constitutional: Negative.   HENT: Negative.    Eyes:  Negative for blurred vision, discharge and redness.  Respiratory: Negative.    Cardiovascular: Negative.   Gastrointestinal:  Negative for abdominal pain.  Genitourinary: Negative.   Musculoskeletal:  Positive for joint pain. Negative for myalgias.  Skin:  Negative for rash.  Neurological:  Negative for tingling, loss of consciousness and weakness.  Endo/Heme/Allergies:  Negative for polydipsia.      Objective:     BP 110/64 (BP Location: Right Arm, Patient Position: Sitting, Cuff Size: Normal)   Pulse 65   Temp (!) 97.3 F (36.3 C) (Temporal)   Ht 5\' 7"  (1.702 m)   Wt 180 lb 12.8 oz (82 kg)   SpO2 96%   BMI 28.32 kg/m  BP Readings from Last 3 Encounters:  08/10/22 110/64  06/08/22 132/78  05/11/22 102/66   Wt Readings from Last 3 Encounters:  08/10/22 180 lb 12.8 oz (82 kg)  06/08/22 170 lb (77.1 kg)  05/11/22 165 lb 6.4 oz (75 kg)      Physical Exam Constitutional:      General: He is not in acute distress.    Appearance: Normal appearance. He is not ill-appearing, toxic-appearing or diaphoretic.  HENT:     Head: Normocephalic and atraumatic.     Right Ear: External ear normal.     Left Ear: External ear normal.     Mouth/Throat:      Mouth: Mucous membranes are moist.     Pharynx: Oropharynx is clear. No oropharyngeal exudate or posterior oropharyngeal erythema.  Eyes:     General: No scleral icterus.       Right eye: No discharge.        Left eye: No discharge.     Extraocular Movements: Extraocular movements intact.     Conjunctiva/sclera: Conjunctivae normal.     Pupils: Pupils are equal, round, and reactive to light.  Cardiovascular:     Rate and Rhythm: Normal rate and regular rhythm.  Pulmonary:     Effort: Pulmonary effort is normal. No respiratory distress.     Breath sounds: Normal breath sounds.  Abdominal:     General: Bowel sounds are normal.     Tenderness: There is no abdominal tenderness. There is no guarding.  Musculoskeletal:     Left shoulder: Decreased range of motion.     Cervical back: No rigidity or tenderness.  Skin:    General: Skin is warm and dry.  Neurological:     Mental Status: He is alert and oriented to person, place, and time.  Psychiatric:        Mood and Affect: Mood normal.  Behavior: Behavior normal.      No results found for any visits on 08/10/22.    The ASCVD Risk score (Arnett DK, et al., 2019) failed to calculate for the following reasons:   The patient has a prior MI or stroke diagnosis    Assessment & Plan:   Problem List Items Addressed This Visit       Cardiovascular and Mediastinum   Coronary artery disease due to lipid rich plaque   Relevant Orders   Lipid panel     Genitourinary   Stage 3b chronic kidney disease (HCC)   Relevant Orders   Basic metabolic panel     Other   Need for influenza vaccination   Relevant Orders   Flu vaccine HIGH DOSE PF (Fluzone High dose)   B12 deficiency   Relevant Orders   Vitamin B12   Iron deficiency   Relevant Orders   CBC   Iron, TIBC and Ferritin Panel   Elevated glucose   Depression, major, single episode, moderate (HCC) - Primary   Neck pain with history of cervical spinal surgery    Relevant Medications   gabapentin (NEURONTIN) 100 MG capsule   Hyperkalemia   Relevant Orders   Basic metabolic panel   Prediabetes   Relevant Orders   Basic metabolic panel   Urinalysis, Routine w reflex microscopic   Vitamin D deficiency   Relevant Orders   VITAMIN D 25 Hydroxy (Vit-D Deficiency, Fractures)    Return in about 6 months (around 02/08/2023), or if symptoms worsen or fail to improve.  Continue all medicines as above.  We will send in prescription for iron pending results of today's labs.  Continue venlafaxine.  Refilled Neurontin for 200 mg nightly.  Mliss Sax, MD

## 2022-08-11 LAB — URINALYSIS, ROUTINE W REFLEX MICROSCOPIC
Bilirubin Urine: NEGATIVE
Hgb urine dipstick: NEGATIVE
Ketones, ur: NEGATIVE
Leukocytes,Ua: NEGATIVE
Nitrite: NEGATIVE
Protein, ur: NEGATIVE
Specific Gravity, Urine: 1.016 (ref 1.001–1.035)
pH: 5.5 (ref 5.0–8.0)

## 2022-08-11 LAB — LIPID PANEL
Cholesterol: 117 mg/dL (ref <200)
HDL: 40 mg/dL (ref >=40)
LDL Cholesterol (Calc): 59 mg/dL (calc)
Non-HDL Cholesterol (Calc): 77 mg/dL (calc) (ref <130)
Total CHOL/HDL Ratio: 2.9 (calc) (ref <5.0)
Triglycerides: 92 mg/dL (ref <150)

## 2022-08-11 LAB — CBC
HCT: 44.2 % (ref 38.5–50.0)
Hemoglobin: 15.1 g/dL (ref 13.2–17.1)
MCH: 30.8 pg (ref 27.0–33.0)
MCHC: 34.2 g/dL (ref 32.0–36.0)
MCV: 90 fL (ref 80.0–100.0)
MPV: 10.7 fL (ref 7.5–12.5)
Platelets: 168 10*3/uL (ref 140–400)
RBC: 4.91 10*6/uL (ref 4.20–5.80)
RDW: 12.1 % (ref 11.0–15.0)
WBC: 9.9 10*3/uL (ref 3.8–10.8)

## 2022-08-11 LAB — IRON,TIBC AND FERRITIN PANEL
%SAT: 23 % (calc) (ref 20–48)
Ferritin: 94 ng/mL (ref 24–380)
Iron: 74 ug/dL (ref 50–180)
TIBC: 322 mcg/dL (calc) (ref 250–425)

## 2022-08-11 LAB — VITAMIN B12: Vitamin B-12: 690 pg/mL (ref 200–1100)

## 2022-08-11 LAB — BASIC METABOLIC PANEL
BUN/Creatinine Ratio: 15 (calc) (ref 6–22)
BUN: 27 mg/dL — ABNORMAL HIGH (ref 7–25)
CO2: 24 mmol/L (ref 20–32)
Calcium: 9.2 mg/dL (ref 8.6–10.3)
Chloride: 104 mmol/L (ref 98–110)
Creat: 1.86 mg/dL — ABNORMAL HIGH (ref 0.70–1.28)
Glucose, Bld: 113 mg/dL — ABNORMAL HIGH (ref 65–99)
Potassium: 4.4 mmol/L (ref 3.5–5.3)
Sodium: 139 mmol/L (ref 135–146)

## 2022-08-11 LAB — VITAMIN D 25 HYDROXY (VIT D DEFICIENCY, FRACTURES): Vit D, 25-Hydroxy: 39 ng/mL (ref 30–100)

## 2022-08-15 ENCOUNTER — Telehealth: Payer: Self-pay | Admitting: Family Medicine

## 2022-08-15 NOTE — Telephone Encounter (Signed)
Caller Name: Nylan Nevel Call back phone #: [phone]  Reason for Call: For Clopidogrel, instructions indicate to place into feeding tube. He does not use tube. Please correct. Would also like a call to speak about recent results

## 2022-08-16 NOTE — Telephone Encounter (Signed)
Returned call no answer LMTCB 

## 2022-08-20 ENCOUNTER — Encounter: Payer: Self-pay | Admitting: Family Medicine

## 2022-08-30 ENCOUNTER — Telehealth: Payer: Self-pay | Admitting: Family Medicine

## 2022-08-30 NOTE — Telephone Encounter (Signed)
Left message for patient to call back and schedule Medicare Annual Wellness Visit (AWV) in office.   If not able to come in office, please offer to do virtually or by telephone.  Left office number and my jabber #[phone].  Last AWV:05/23/2021   Please schedule at anytime with Nurse Health Advisor.  

## 2022-09-27 ENCOUNTER — Telehealth: Payer: Self-pay | Admitting: Family Medicine

## 2022-09-27 NOTE — Telephone Encounter (Signed)
Left message for patient to call back and schedule Medicare Annual Wellness Visit (AWV) in office.   If not able to come in office, please offer to do virtually or by telephone.  Left office number and my jabber [phone].  Last AWV:05/23/2021   Please schedule at anytime with Nurse Health Advisor.

## 2022-11-01 ENCOUNTER — Other Ambulatory Visit: Payer: Self-pay | Admitting: Family Medicine

## 2022-11-01 DIAGNOSIS — F321 Major depressive disorder, single episode, moderate: Secondary | ICD-10-CM

## 2022-11-01 DIAGNOSIS — R5382 Chronic fatigue, unspecified: Secondary | ICD-10-CM

## 2022-11-07 ENCOUNTER — Telehealth: Payer: Self-pay | Admitting: Family Medicine

## 2022-11-07 NOTE — Telephone Encounter (Signed)
Left message for patient to call back and schedule Medicare Annual Wellness Visit (AWV) either virtually or in office. Left  my jabber number [phone]   Last AWV 05/23/21 please schedule with Nurse Health Adviser   45 min for awv-i  in office appointments 30 min for awv-s & awv-i phone/virtual appointments  

## 2022-11-12 ENCOUNTER — Other Ambulatory Visit: Payer: Self-pay | Admitting: Family Medicine

## 2022-11-12 DIAGNOSIS — N5201 Erectile dysfunction due to arterial insufficiency: Secondary | ICD-10-CM

## 2022-11-19 ENCOUNTER — Ambulatory Visit (INDEPENDENT_AMBULATORY_CARE_PROVIDER_SITE_OTHER): Payer: Medicare Other

## 2022-11-19 VITALS — Ht 67.0 in | Wt 175.0 lb

## 2022-11-19 DIAGNOSIS — Z0000 Encounter for general adult medical examination without abnormal findings: Secondary | ICD-10-CM

## 2022-11-19 NOTE — Patient Instructions (Signed)
Ian Ramos , Thank you for taking time to come for your Medicare Wellness Visit. I appreciate your ongoing commitment to your health goals. Please review the following plan we discussed and let me know if I can assist you in the future.   These are the goals we discussed:  Goals      Chronic Care Management     CARE PLAN ENTRY  Current Barriers:  Chronic Disease Management support, education, and care coordination needs related to Hypertension, Hyperlipidemia, Heart Failure, Coronary Artery Disease, GERD, Chronic Kidney Disease, and Chronic pain   Hypertension Pharmacist Clinical Goal(s): Over the next 90 days, patient will work with PharmD and providers to maintain BP goal <140/90 Current regimen:  Carvedilol 3.125 mg BID Interventions: Recommend purchasing blood pressure monitor at [employer] Patient self care activities - Over the next 90 days, patient will: Check blood pressure weekly, document, and provide at future appointments Ensure daily salt intake < 2300 mg/day  Heart Failure Pharmacist Clinical Goal(s): Over the next 90 days, patient will work with PharmD and providers to maintain BP goal <140/90 Current regimen:  Carvedilol 3.125 mg BID Patient self care activities - Over the next 90 days, patient will: Check weight daily, document, and provide at future appointments Limit salt intake to less than 2300 mg/day  Hyperlipidemia Pharmacist Clinical Goal(s): Over the next 90 days, patient will work with PharmD and providers to maintain LDL goal < 70 Current regimen:  Atorvastatin 80 mg  Medication management Pharmacist Clinical Goal(s): Over the next 90 days, patient will work with PharmD and providers to maintain optimal medication adherence Current pharmacy: [facility] Interventions Comprehensive medication review performed. Continue current medication management strategy Patient self care activities - Over the next 90 days, patient will: Take medications as  prescribed Report any questions or concerns to PharmD and/or provider(s)     Patient Stated     none     Patient Stated     11/19/2022, get stronger        This is a list of the screening recommended for you and due dates:  Health Maintenance  Topic Date Due   Complete foot exam   Never done   Eye exam for diabetics  Never done   Hepatitis C Screening: USPSTF Recommendation to screen - Ages 73-79 yo.  Never done   DTaP/Tdap/Td vaccine (1 - Tdap) Never done   Pneumonia Vaccine (2 of 2 - PCV) 09/16/2018   Hemoglobin A1C  09/29/2022   Yearly kidney health urinalysis for diabetes  12/29/2022   Yearly kidney function blood test for diabetes  08/11/2023   Medicare Annual Wellness Visit  11/20/2023   Flu Shot  Completed   HPV Vaccine  Aged Out   COVID-19 Vaccine  Discontinued   Zoster (Shingles) Vaccine  Discontinued    Advanced directives: copy in chart  Conditions/risks identified: chews tobacco  Next appointment: Follow up in one year for your annual wellness visit.   Preventive Care 27 Years and Older, Male  Preventive care refers to lifestyle choices and visits with your health care provider that can promote health and wellness. What does preventive care include? A yearly physical exam. This is also called an annual well check. Dental exams once or twice a year. Routine eye exams. Ask your health care provider how often you should have your eyes checked. Personal lifestyle choices, including: Daily care of your teeth and gums. Regular physical activity. Eating a healthy diet. Avoiding tobacco and drug use. Limiting alcohol use.  Practicing safe sex. Taking low doses of aspirin every day. Taking vitamin and mineral supplements as recommended by your health care provider. What happens during an annual well check? The services and screenings done by your health care provider during your annual well check will depend on your age, overall health, lifestyle risk factors,  and family history of disease. Counseling  Your health care provider may ask you questions about your: Alcohol use. Tobacco use. Drug use. Emotional well-being. Home and relationship well-being. Sexual activity. Eating habits. History of falls. Memory and ability to understand (cognition). Work and work Statistician. Screening  You may have the following tests or measurements: Height, weight, and BMI. Blood pressure. Lipid and cholesterol levels. These may be checked every 5 years, or more frequently if you are over 49 years old. Skin check. Lung cancer screening. You may have this screening every year starting at age 4 if you have a 30-pack-year history of smoking and currently smoke or have quit within the past 15 years. Fecal occult blood test (FOBT) of the stool. You may have this test every year starting at age 61. Flexible sigmoidoscopy or colonoscopy. You may have a sigmoidoscopy every 5 years or a colonoscopy every 10 years starting at age 69. Prostate cancer screening. Recommendations will vary depending on your family history and other risks. Hepatitis C blood test. Hepatitis B blood test. Sexually transmitted disease (STD) testing. Diabetes screening. This is done by checking your blood sugar (glucose) after you have not eaten for a while (fasting). You may have this done every 1-3 years. Abdominal aortic aneurysm (AAA) screening. You may need this if you are a current or former smoker. Osteoporosis. You may be screened starting at age 67 if you are at high risk. Talk with your health care provider about your test results, treatment options, and if necessary, the need for more tests. Vaccines  Your health care provider may recommend certain vaccines, such as: Influenza vaccine. This is recommended every year. Tetanus, diphtheria, and acellular pertussis (Tdap, Td) vaccine. You may need a Td booster every 10 years. Zoster vaccine. You may need this after age  43. Pneumococcal 13-valent conjugate (PCV13) vaccine. One dose is recommended after age 21. Pneumococcal polysaccharide (PPSV23) vaccine. One dose is recommended after age 46. Talk to your health care provider about which screenings and vaccines you need and how often you need them. This information is not intended to replace advice given to you by your health care provider. Make sure you discuss any questions you have with your health care provider. Document Released: 10/14/2015 Document Revised: 06/06/2016 Document Reviewed: 07/19/2015 Elsevier Interactive Patient Education  2017 [place] Prevention in the Home Falls can cause injuries. They can happen to people of all ages. There are many things you can do to make your home safe and to help prevent falls. What can I do on the outside of my home? Regularly fix the edges of walkways and driveways and fix any cracks. Remove anything that might make you trip as you walk through a door, such as a raised step or threshold. Trim any bushes or trees on the path to your home. Use bright outdoor lighting. Clear any walking paths of anything that might make someone trip, such as rocks or tools. Regularly check to see if handrails are loose or broken. Make sure that both sides of any steps have handrails. Any raised decks and porches should have guardrails on the edges. Have any leaves, snow, or ice  cleared regularly. Use sand or salt on walking paths during winter. Clean up any spills in your garage right away. This includes oil or grease spills. What can I do in the bathroom? Use night lights. Install grab bars by the toilet and in the tub and shower. Do not use towel bars as grab bars. Use non-skid mats or decals in the tub or shower. If you need to sit down in the shower, use a plastic, non-slip stool. Keep the floor dry. Clean up any water that spills on the floor as soon as it happens. Remove soap buildup in the tub or shower  regularly. Attach bath mats securely with double-sided non-slip rug tape. Do not have throw rugs and other things on the floor that can make you trip. What can I do in the bedroom? Use night lights. Make sure that you have a light by your bed that is easy to reach. Do not use any sheets or blankets that are too big for your bed. They should not hang down onto the floor. Have a firm chair that has side arms. You can use this for support while you get dressed. Do not have throw rugs and other things on the floor that can make you trip. What can I do in the kitchen? Clean up any spills right away. Avoid walking on wet floors. Keep items that you use a lot in easy-to-reach places. If you need to reach something above you, use a strong step stool that has a grab bar. Keep electrical cords out of the way. Do not use floor polish or wax that makes floors slippery. If you must use wax, use non-skid floor wax. Do not have throw rugs and other things on the floor that can make you trip. What can I do with my stairs? Do not leave any items on the stairs. Make sure that there are handrails on both sides of the stairs and use them. Fix handrails that are broken or loose. Make sure that handrails are as long as the stairways. Check any carpeting to make sure that it is firmly attached to the stairs. Fix any carpet that is loose or worn. Avoid having throw rugs at the top or bottom of the stairs. If you do have throw rugs, attach them to the floor with carpet tape. Make sure that you have a light switch at the top of the stairs and the bottom of the stairs. If you do not have them, ask someone to add them for you. What else can I do to help prevent falls? Wear shoes that: Do not have high heels. Have rubber bottoms. Are comfortable and fit you well. Are closed at the toe. Do not wear sandals. If you use a stepladder: Make sure that it is fully opened. Do not climb a closed stepladder. Make sure that  both sides of the stepladder are locked into place. Ask someone to hold it for you, if possible. Clearly mark and make sure that you can see: Any grab bars or handrails. First and last steps. Where the edge of each step is. Use tools that help you move around (mobility aids) if they are needed. These include: Canes. Walkers. Scooters. Crutches. Turn on the lights when you go into a dark area. Replace any light bulbs as soon as they burn out. Set up your furniture so you have a clear path. Avoid moving your furniture around. If any of your floors are uneven, fix them. If there are any pets  around you, be aware of where they are. Review your medicines with your doctor. Some medicines can make you feel dizzy. This can increase your chance of falling. Ask your doctor what other things that you can do to help prevent falls. This information is not intended to replace advice given to you by your health care provider. Make sure you discuss any questions you have with your health care provider. Document Released: 07/14/2009 Document Revised: 02/23/2016 Document Reviewed: 10/22/2014 Elsevier Interactive Patient Education  2017 [employer].

## 2022-11-19 NOTE — Progress Notes (Signed)
I connected with  Ian Ramos on 11/19/22 by a audio enabled telemedicine application and verified that I am speaking with the correct person using two identifiers.  Patient Location: Home  Provider Location: Office/Clinic  I discussed the limitations of evaluation and management by telemedicine. The patient expressed understanding and agreed to proceed.  Subjective:   Ian Ramos is a 77 y.o. male who presents for Medicare Annual/Subsequent preventive examination.  Review of Systems     Cardiac Risk Factors include: advanced age (>32mn, >>17women);diabetes mellitus;male gender;smoking/ tobacco exposure     Objective:    Today's Vitals   11/19/22 1345  Weight: 175 lb (79.4 kg)  Height: '5\' 7"'$  (1.702 m)   Body mass index is 27.41 kg/m.     11/19/2022    1:48 PM 05/23/2021   12:22 PM 06/09/2019    9:43 PM 06/28/2018    5:00 PM 06/19/2018    6:28 PM 05/29/2018    2:00 AM 05/26/2018    6:48 AM  Advanced Directives  Does Patient Have a Medical Advance Directive? Yes Yes No Yes Yes Yes Yes  Type of [place] will Healthcare Power of [place] Attorney  Does patient want to make changes to medical advance directive?    No - Patient declined No - Patient declined No - Patient declined   Copy of HGretnain Chart? Yes - validated most recent copy scanned in chart (See row information) Yes - validated most recent copy scanned in chart (See row information)  Yes Yes No - copy requested   Would patient like information on creating a medical advance directive?   No - Guardian declined        Current Medications (verified) Outpatient Encounter Medications as of 11/19/2022  Medication Sig   aspirin EC 81 MG tablet Take 81 mg by mouth daily.   atorvastatin (LIPITOR) 80 MG tablet TAKE 1 TABLET(80 MG) BY MOUTH  DAILY   B Complex Vitamins (B COMPLEX-B12) TABS Take one tablet daily   carvedilol (COREG) 3.125 MG tablet TAKE 1 TABLET(3.125 MG) BY MOUTH TWICE DAILY WITH A MEAL   clopidogrel (PLAVIX) 75 MG tablet PLACE 1 TABLET INTO FEEDING TUBE DAILY   empagliflozin (JARDIANCE) 10 MG TABS tablet TAKE 1 TABLET(10 MG) BY MOUTH DAILY BEFORE BREAKFAST   gabapentin (NEURONTIN) 100 MG capsule Take 2 capsules (200 mg total) by mouth at bedtime.   sildenafil (REVATIO) 20 MG tablet TAKE 3-5 TABLETS BY MOUTH DAILY 45 MINUTES PRIOR   TART CHERRY PO Take by mouth.   venlafaxine XR (EFFEXOR-XR) 150 MG 24 hr capsule TAKE 1 CAPSULE(150 MG) BY MOUTH DAILY WITH BREAKFAST   ferrous sulfate (FEROSUL) 325 (65 FE) MG tablet Take 1 every other day. (Patient not taking: Reported on 08/10/2022)   HYDROcodone-acetaminophen (NORCO) 5-325 MG tablet One at night as needed for pain (Patient not taking: Reported on 08/10/2022)   Facility-Administered Encounter Medications as of 11/19/2022  Medication   etomidate (AMIDATE) injection   succinylcholine (ANECTINE) injection    Allergies (verified) Cymbalta [duloxetine hcl] and Effexor xr [venlafaxine hcl er]   History: Past Medical History:  Diagnosis Date   Arthritis    CAD in native artery    a. cath 06/2018 - Eccentric 75% proximal LAD; 75% OM2; occluded RV with collaterals   CKD (chronic kidney disease), stage III ([place]    MI (  myocardial infarction) ([place])    Neuromuscular disorder ([place])    Stroke [facility])    Past Surgical History:  Procedure Laterality Date   CAROTID PTA/STENT INTERVENTION N/A 06/27/2018   Procedure: CAROTID PTA/STENT INTERVENTION;  Surgeon: Elam Dutch, MD;  Location: [place] [facility];  Service: Cardiovascular;  Laterality: N/A;   LEFT HEART CATH AND CORONARY ANGIOGRAPHY N/A 06/05/2018   Procedure: LEFT HEART CATH AND CORONARY ANGIOGRAPHY;  Surgeon: Martinique, Peter M, MD;  Location: [place] [facility];  Service: Cardiovascular;  Laterality: N/A;    POSTERIOR CERVICAL FUSION/FORAMINOTOMY N/A 05/23/2018   Procedure: Posterior Cervical Fusion with lateral mass fixation - C1 - C6 with laminectomy;  Surgeon: Earnie Larsson, MD;  Location: [address];  Service: Neurosurgery;  Laterality: N/A;   Family History  Problem Relation Age of Onset   Hypertension Other    Heart failure Mother    CAD Father        s/p CABG in his 74s   Heart failure Father    Social History   Socioeconomic History   Marital status: Divorced    Spouse name: Not on file   Number of children: Not on file   Years of education: Not on file   Highest education level: Not on file  Occupational History   Not on file  Tobacco Use   Smoking status: Never   Smokeless tobacco: Current    Types: Chew  Vaping Use   Vaping Use: Never used  Substance and Sexual Activity   Alcohol use: No   Drug use: No   Sexual activity: Not on file  Other Topics Concern   Not on file  Social History Narrative   NA   Social Determinants of Health   Financial Resource Strain: Low Risk  (11/19/2022)   Overall Financial Resource Strain (CARDIA)    Difficulty of Paying Living Expenses: Not hard at all  Food Insecurity: No Food Insecurity (11/19/2022)   Hunger Vital Sign    Worried About Running Out of Food in the Last Year: Never true    Ran Out of Food in the Last Year: Never true  Transportation Needs: No Transportation Needs (11/19/2022)   PRAPARE - Hydrologist (Medical): No    Lack of Transportation (Non-Medical): No  Physical Activity: Inactive (11/19/2022)   Exercise Vital Sign    Days of Exercise per Week: 0 days    Minutes of Exercise per Session: 0 min  Stress: No Stress Concern Present (11/19/2022)   [place]    Feeling of Stress : Not at all  Social Connections: Moderately Isolated (05/23/2021)   Social Connection and Isolation Panel [NHANES]    Frequency of Communication with  Friends and Family: More than three times a week    Frequency of Social Gatherings with Friends and Family: More than three times a week    Attends Religious Services: Never    Marine scientist or Organizations: No    Attends Music therapist: Never    Marital Status: Married    Tobacco Counseling Ready to quit: Not Answered Counseling given: Not Answered   Clinical Intake:  Pre-visit preparation completed: Yes  Pain : No/denies pain     Nutritional Status: BMI 25 -29 Overweight Nutritional Risks: None Diabetes: Yes  How often do you need to have someone help you when you read instructions, pamphlets, or other written materials from your doctor or pharmacy?:  1 - Never  Diabetic? Yes Nutrition Risk Assessment:  Has the patient had any N/V/D within the last 2 months?  No  Does the patient have any non-healing wounds?  No  Has the patient had any unintentional weight loss or weight gain?  No   Diabetes:  Is the patient diabetic?  Yes  If diabetic, was a CBG obtained today?  No  Did the patient bring in their glucometer from home?  No  How often do you monitor your CBG's? Does not.   Financial Strains and Diabetes Management:  Are you having any financial strains with the device, your supplies or your medication? No .  Does the patient want to be seen by Chronic Care Management for management of their diabetes?  No  Would the patient like to be referred to a Nutritionist or for Diabetic Management?  No   Diabetic Exams:  Diabetic Eye Exam: Overdue for diabetic eye exam. Pt has been advised about the importance in completing this exam. Patient advised to call and schedule an eye exam. Diabetic Foot Exam: Overdue, Pt has been advised about the importance in completing this exam. Pt is scheduled for diabetic foot exam on next appointment. .  Interpreter Needed?: No  Information entered by :: NAllen LPN   Activities of Daily Living    11/19/2022     1:49 PM  In your present state of health, do you have any difficulty performing the following activities:  Hearing? 0  Vision? 0  Difficulty concentrating or making decisions? 0  Walking or climbing stairs? 0  Dressing or bathing? 0  Doing errands, shopping? 0  Preparing Food and eating ? N  Using the Toilet? N  In the past six months, have you accidently leaked urine? N  Do you have problems with loss of bowel control? N  Managing your Medications? N  Managing your Finances? N  Housekeeping or managing your Housekeeping? N    Patient Care Team: Libby Maw, MD as PCP - General (Family Medicine) Sueanne Margarita, MD as PCP - Cardiology (Cardiology) Germaine Pomfret, [facility] as Pharmacist (Pharmacist)  Indicate any recent Medical Services you may have received from other than [facility] providers in the past year (date may be approximate).     Assessment:   This is a routine wellness examination for [place].  Hearing/Vision screen Vision Screening - Comments:: No regular eye exams  Dietary issues and exercise activities discussed: Current Exercise Habits: The patient does not participate in regular exercise at present   Goals Addressed             This Visit's Progress    Patient Stated       11/19/2022, get stronger       Depression Screen    11/19/2022    1:49 PM 08/10/2022    2:42 PM 05/11/2022    2:10 PM 03/30/2022    3:54 PM 12/28/2021    2:40 PM 10/30/2021    2:03 PM 08/29/2021    2:09 PM  PHQ 2/9 Scores  PHQ - 2 Score 0 0 0 4 0 0 2  PHQ- 9 Score    6       Fall Risk    11/19/2022    1:49 PM 08/10/2022    2:42 PM 05/11/2022    2:10 PM 03/30/2022    3:07 PM 12/28/2021    2:40 PM  Fall Risk   Falls in the past year? 0 0 0 1 0  Number falls in past yr: 0 0 0 1 0  Injury with Fall? 0   0   Comment    bruised   Risk for fall due to : Medication side effect      Follow up Falls prevention discussed;Education provided;Falls evaluation completed         FALL RISK PREVENTION PERTAINING TO THE HOME:  Any stairs in or around the home? Yes  If so, are there any without handrails? Yes  Home free of loose throw rugs in walkways, pet beds, electrical cords, etc? Yes  Adequate lighting in your home to reduce risk of falls? Yes   ASSISTIVE DEVICES UTILIZED TO PREVENT FALLS:  Life alert? No  Use of a cane, walker or w/c? No  Grab bars in the bathroom? No  Shower chair or bench in shower? No  Elevated toilet seat or a handicapped toilet? No   TIMED UP AND GO:  Was the test performed? No .      Cognitive Function:        11/19/2022    1:49 PM  6CIT Screen  What Year? 0 points  What month? 0 points  What time? 0 points  Count back from 20 0 points  Months in reverse 2 points  Repeat phrase 6 points  Total Score 8 points    Immunizations Immunization History  Administered Date(s) Administered   Fluad Quad(high Dose 65+) 06/26/2019, 07/07/2020   Influenza, High Dose Seasonal PF 09/16/2017, 06/22/2018, 08/10/2022   Influenza-Unspecified 08/07/2021   Janssen (J&J) SARS-COV-2 Vaccination 01/09/2020   Pneumococcal Polysaccharide-23 09/16/2017    TDAP status: Due, Education has been provided regarding the importance of this vaccine. Advised may receive this vaccine at local pharmacy or Health Dept. Aware to provide a copy of the vaccination record if obtained from local pharmacy or Health Dept. Verbalized acceptance and understanding.  Flu Vaccine status: Up to date  Pneumococcal vaccine status: Up to date  Covid-19 vaccine status: Completed vaccines  Qualifies for Shingles Vaccine? Yes   Zostavax completed No   Shingrix Completed?: No.    Education has been provided regarding the importance of this vaccine. Patient has been advised to call insurance company to determine out of pocket expense if they have not yet received this vaccine. Advised may also receive vaccine at local pharmacy or Health Dept. Verbalized acceptance  and understanding.  Screening Tests Health Maintenance  Topic Date Due   FOOT EXAM  Never done   OPHTHALMOLOGY EXAM  Never done   Hepatitis C Screening  Never done   DTaP/Tdap/Td (1 - Tdap) Never done   Pneumonia Vaccine 79+ Years old (2 of 2 - PCV) 09/16/2018   HEMOGLOBIN A1C  09/29/2022   Diabetic kidney evaluation - Urine ACR  12/29/2022   Diabetic kidney evaluation - eGFR measurement  08/11/2023   Medicare Annual Wellness (AWV)  11/20/2023   INFLUENZA VACCINE  Completed   HPV VACCINES  Aged Out   COVID-19 Vaccine  Discontinued   Zoster Vaccines- Shingrix  Discontinued    Health Maintenance  Health Maintenance Due  Topic Date Due   FOOT EXAM  Never done   OPHTHALMOLOGY EXAM  Never done   Hepatitis C Screening  Never done   DTaP/Tdap/Td (1 - Tdap) Never done   Pneumonia Vaccine 66+ Years old (2 of 2 - PCV) 09/16/2018   HEMOGLOBIN A1C  09/29/2022    Colorectal cancer screening: No longer required.   Lung Cancer Screening: (Low Dose CT Chest recommended  if Age 29-80 years, 58 pack-year currently smoking OR have quit w/in 15years.) does not qualify.   Lung Cancer Screening Referral: no  Additional Screening:  Hepatitis C Screening: does qualify;   Vision Screening: Recommended annual ophthalmology exams for early detection of glaucoma and other disorders of the eye. Is the patient up to date with their annual eye exam?  No  Who is the provider or what is the name of the office in which the patient attends annual eye exams? none If pt is not established with a provider, would they like to be referred to a provider to establish care? No .   Dental Screening: Recommended annual dental exams for proper oral hygiene  Community Resource Referral / Chronic Care Management: CRR required this visit?  No   CCM required this visit?  No      Plan:     I have personally reviewed and noted the following in the patient's chart:   Medical and social history Use of  alcohol, tobacco or illicit drugs  Current medications and supplements including opioid prescriptions. Patient is not currently taking opioid prescriptions. Functional ability and status Nutritional status Physical activity Advanced directives List of other physicians Hospitalizations, surgeries, and ER visits in previous 12 months Vitals Screenings to include cognitive, depression, and falls Referrals and appointments  In addition, I have reviewed and discussed with patient certain preventive protocols, quality metrics, and best practice recommendations. A written personalized care plan for preventive services as well as general preventive health recommendations were provided to patient.     Kellie Simmering, LPN   [id number]   Nurse Notes: none  Due to this being a virtual visit, the after visit summary with patients personalized plan was offered to patient via mail or my-chart.  to pick up at office at next visit

## 2022-11-25 ENCOUNTER — Other Ambulatory Visit: Payer: Self-pay | Admitting: Family Medicine

## 2022-11-25 DIAGNOSIS — E1122 Type 2 diabetes mellitus with diabetic chronic kidney disease: Secondary | ICD-10-CM

## 2022-12-31 ENCOUNTER — Other Ambulatory Visit: Payer: Self-pay | Admitting: Family Medicine

## 2022-12-31 DIAGNOSIS — D649 Anemia, unspecified: Secondary | ICD-10-CM

## 2022-12-31 DIAGNOSIS — I10 Essential (primary) hypertension: Secondary | ICD-10-CM

## 2022-12-31 DIAGNOSIS — I2510 Atherosclerotic heart disease of native coronary artery without angina pectoris: Secondary | ICD-10-CM

## 2023-01-17 DIAGNOSIS — K080 Exfoliation of teeth due to systemic causes: Secondary | ICD-10-CM | POA: Diagnosis not present

## 2023-01-22 DIAGNOSIS — K080 Exfoliation of teeth due to systemic causes: Secondary | ICD-10-CM | POA: Diagnosis not present

## 2023-01-23 ENCOUNTER — Ambulatory Visit (INDEPENDENT_AMBULATORY_CARE_PROVIDER_SITE_OTHER): Payer: Medicare Other | Admitting: Family Medicine

## 2023-01-23 ENCOUNTER — Encounter: Payer: Self-pay | Admitting: Family Medicine

## 2023-01-23 ENCOUNTER — Other Ambulatory Visit: Payer: Self-pay | Admitting: Family Medicine

## 2023-01-23 VITALS — BP 96/62 | HR 69 | Temp 97.8°F | Ht 67.0 in | Wt 191.2 lb

## 2023-01-23 DIAGNOSIS — E861 Hypovolemia: Secondary | ICD-10-CM

## 2023-01-23 DIAGNOSIS — L989 Disorder of the skin and subcutaneous tissue, unspecified: Secondary | ICD-10-CM

## 2023-01-23 DIAGNOSIS — I9589 Other hypotension: Secondary | ICD-10-CM

## 2023-01-23 DIAGNOSIS — Z23 Encounter for immunization: Secondary | ICD-10-CM | POA: Diagnosis not present

## 2023-01-23 DIAGNOSIS — Z9889 Other specified postprocedural states: Secondary | ICD-10-CM

## 2023-01-23 MED ORDER — METOPROLOL TARTRATE 25 MG PO TABS
25.0000 mg | ORAL_TABLET | Freq: Two times a day (BID) | ORAL | 3 refills | Status: DC
Start: 2023-01-23 — End: 2023-02-08

## 2023-01-23 NOTE — Telephone Encounter (Signed)
Please advise message below patient in the office today forgot to asked for refill on pending Rx.

## 2023-01-23 NOTE — Telephone Encounter (Signed)
Prescription Request  01/23/2023  LOV: 01/23/2023  What is the name of the medication or equipment? HYDROcodone-acetaminophen (NORCO) 5-325 MG tablet   Pt states he takes PRN and pharmacy told him to call doctor.  Have you contacted your pharmacy to request a refill? Yes   Which pharmacy would you like this sent to?  [id number] [place], [place] - [address] AT [facility] [address] [place] [address] Phone: [phone] Fax: [phone]    Patient notified that their request is being sent to the clinical staff for review and that they should receive a response within 2 business days.   Please advise at Mobile [phone] (mobile)

## 2023-01-23 NOTE — Progress Notes (Signed)
Established Patient Office Visit   Subjective:  Patient ID: Ian Ramos, male    DOB: 12-07-1945  Age: 77 y.o. MRN: [id number]  Chief Complaint  Patient presents with   facial lesion    HPI Encounter Diagnoses  Name Primary?   Hypotension due to hypovolemia Yes   Facial skin lesion    Immunization due    Follow-up on a nonhealing sore on the left side of his face.  Lesion has been present for 6 weeks.  There was no injury.  His dentist saw the lesion and was concerned.  he has been referred to dermatology and has an appointment next week.  He wanted me to see it as well.  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  He denies lightheadedness or dizziness.  Continues with carvedilol.  Admits that he could do a better job of staying hydrated.   Review of Systems  Constitutional: Negative.   HENT: Negative.    Eyes:  Negative for blurred vision, discharge and redness.  Respiratory: Negative.    Cardiovascular: Negative.  Negative for chest pain and palpitations.  Gastrointestinal:  Negative for abdominal pain.  Genitourinary: Negative.   Musculoskeletal: Negative.  Negative for myalgias.  Skin:  Negative for rash.  Neurological:  Negative for dizziness, tingling, loss of consciousness, weakness and headaches.  Endo/Heme/Allergies:  Negative for polydipsia.     Current Outpatient Medications:    aspirin EC 81 MG tablet, Take 81 mg by mouth daily., Disp: , Rfl:    atorvastatin (LIPITOR) 80 MG tablet, TAKE 1 TABLET(80 MG) BY MOUTH DAILY, Disp: 90 tablet, Rfl: 2   B Complex Vitamins (B COMPLEX-B12) TABS, Take one tablet daily, Disp: 90 tablet, Rfl: 1   clopidogrel (PLAVIX) 75 MG tablet, PLACE 1 TABLET INTO FEEDING TUBE DAILY, Disp: 90 tablet, Rfl: 2   FEROSUL 325 (65 Fe) MG tablet, TAKE 1 TABLET BY MOUTH EVERY OTHER DAY, Disp: 45 tablet, Rfl: 2   gabapentin (NEURONTIN) 100 MG capsule, Take 2 capsules (200 mg total) by mouth at bedtime., Disp: 180 capsule, Rfl: 1    HYDROcodone-acetaminophen (NORCO) 5-325 MG tablet, One at night as needed for pain, Disp: 30 tablet, Rfl: 0   JARDIANCE 10 MG TABS tablet, TAKE 1 TABLET(10 MG) BY MOUTH DAILY BEFORE BREAKFAST, Disp: 90 tablet, Rfl: 3   metoprolol tartrate (LOPRESSOR) 25 MG tablet, Take 1 tablet (25 mg total) by mouth 2 (two) times daily., Disp: 180 tablet, Rfl: 3   sildenafil (REVATIO) 20 MG tablet, TAKE 3-5 TABLETS BY MOUTH DAILY 45 MINUTES PRIOR, Disp: 25 tablet, Rfl: 2   TART CHERRY PO, Take by mouth., Disp: , Rfl:    venlafaxine XR (EFFEXOR-XR) 150 MG 24 hr capsule, TAKE 1 CAPSULE(150 MG) BY MOUTH DAILY WITH BREAKFAST, Disp: 90 capsule, Rfl: 1 No current facility-administered medications for this visit.  Facility-Administered Medications Ordered in Other Visits:    etomidate (AMIDATE) injection, , , Anesthesia Intra-op, Lucinda Dell, CRNA, 12 mg at 06/09/18 1445   succinylcholine (ANECTINE) injection, , , Anesthesia Intra-op, Lucinda Dell, CRNA, 100 mg at 06/09/18 1445   Objective:     BP 96/62   Pulse 69   Temp 97.8 F (36.6 C)   Ht  (1.702 m)   Wt 191 lb 3.2 oz (86.7 kg)   SpO2 95%   BMI 29.95 kg/m    Physical Exam Constitutional:      General: He is not in acute distress.    Appearance:  Normal appearance. He is not ill-appearing, toxic-appearing or diaphoretic.  HENT:     Head: Normocephalic and atraumatic.     Right Ear: External ear normal.     Left Ear: External ear normal.  Eyes:     General: No scleral icterus.       Right eye: No discharge.        Left eye: No discharge.     Extraocular Movements: Extraocular movements intact.     Conjunctiva/sclera: Conjunctivae normal.  Cardiovascular:     Rate and Rhythm: Normal rate and regular rhythm.  Pulmonary:     Effort: Pulmonary effort is normal. No respiratory distress.     Breath sounds: Normal breath sounds.  Skin:    General: Skin is warm and dry.       Neurological:     Mental Status: He is alert and  oriented to person, place, and time.  Psychiatric:        Mood and Affect: Mood normal.        Behavior: Behavior normal.      No results found for any visits on 01/23/23.    The ASCVD Risk score (Arnett DK, et al., 2019) failed to calculate for the following reasons:   The patient has a prior MI or stroke diagnosis    Assessment & Plan:   Hypotension due to hypovolemia -     Metoprolol Tartrate; Take 1 tablet (25 mg total) by mouth 2 (two) times daily.  Dispense: 180 tablet; Refill: 3  Facial skin lesion  Immunization due -     Tdap vaccine greater than or equal to 7yo IM    Return Stop carvedilol.  Start metoprolol 25 mg.  Take twice daily.  Follow-up in May as planned..  Will work on keeping himself hydrated.  Blood pressure should come up some switching to metoprolol.  He has follow-up scheduled with me on May 5.  He will see dermatology next week.  Mliss Sax, MD

## 2023-01-28 DIAGNOSIS — L570 Actinic keratosis: Secondary | ICD-10-CM | POA: Diagnosis not present

## 2023-01-28 DIAGNOSIS — D485 Neoplasm of uncertain behavior of skin: Secondary | ICD-10-CM | POA: Diagnosis not present

## 2023-01-28 DIAGNOSIS — C44319 Basal cell carcinoma of skin of other parts of face: Secondary | ICD-10-CM | POA: Diagnosis not present

## 2023-01-30 ENCOUNTER — Other Ambulatory Visit: Payer: Self-pay | Admitting: Family Medicine

## 2023-01-30 DIAGNOSIS — M542 Cervicalgia: Secondary | ICD-10-CM

## 2023-01-31 ENCOUNTER — Other Ambulatory Visit: Payer: Self-pay | Admitting: Family Medicine

## 2023-01-31 DIAGNOSIS — I2510 Atherosclerotic heart disease of native coronary artery without angina pectoris: Secondary | ICD-10-CM

## 2023-01-31 DIAGNOSIS — I6523 Occlusion and stenosis of bilateral carotid arteries: Secondary | ICD-10-CM

## 2023-02-08 ENCOUNTER — Encounter: Payer: Self-pay | Admitting: Family Medicine

## 2023-02-08 ENCOUNTER — Ambulatory Visit (INDEPENDENT_AMBULATORY_CARE_PROVIDER_SITE_OTHER): Payer: Medicare Other | Admitting: Family Medicine

## 2023-02-08 VITALS — BP 98/62 | HR 65 | Temp 97.7°F | Ht 67.0 in | Wt 188.4 lb

## 2023-02-08 DIAGNOSIS — N1832 Chronic kidney disease, stage 3b: Secondary | ICD-10-CM | POA: Diagnosis not present

## 2023-02-08 DIAGNOSIS — I2583 Coronary atherosclerosis due to lipid rich plaque: Secondary | ICD-10-CM | POA: Diagnosis not present

## 2023-02-08 DIAGNOSIS — E611 Iron deficiency: Secondary | ICD-10-CM | POA: Diagnosis not present

## 2023-02-08 DIAGNOSIS — R7303 Prediabetes: Secondary | ICD-10-CM | POA: Diagnosis not present

## 2023-02-08 DIAGNOSIS — I2510 Atherosclerotic heart disease of native coronary artery without angina pectoris: Secondary | ICD-10-CM

## 2023-02-08 DIAGNOSIS — E861 Hypovolemia: Secondary | ICD-10-CM

## 2023-02-08 LAB — CBC
MCHC: 33.7 g/dL (ref 32.0–36.0)
RBC: 4.72 10*6/uL (ref 4.20–5.80)
WBC: 8.8 10*3/uL (ref 3.8–10.8)

## 2023-02-08 MED ORDER — METOPROLOL TARTRATE 25 MG PO TABS: 12.5000 mg | ORAL_TABLET | Freq: Two times a day (BID) | ORAL | 3 refills | Status: DC

## 2023-02-08 NOTE — Progress Notes (Signed)
Established Patient Office Visit   Subjective:  Patient ID: Ian Ramos, male    DOB: Feb 07, 1946  Age: 77 y.o. MRN: [id number]  Chief Complaint  Patient presents with   Medical Management of Chronic Issues    6 month follow up , no concerns.     HPI Encounter Diagnoses  Name Primary?   Prediabetes Yes   Stage 3b chronic kidney disease (HCC)    Coronary artery disease due to lipid rich plaque    Iron deficiency    Hypotension due to hypovolemia    Doing well.  Blood pressure remains low status post change to metoprolol succinate twice daily.  Again he denies lightheadedness.  Continues to be active around his [place].  Denies shortness of breath or chest pain.  Continues with high-dose atorvastatin.  Continues iron tablets every other day.  Continues with Jardiance.   Review of Systems  Constitutional: Negative.   HENT: Negative.    Eyes:  Negative for blurred vision, discharge and redness.  Respiratory: Negative.    Cardiovascular: Negative.   Gastrointestinal:  Negative for abdominal pain.  Genitourinary: Negative.   Musculoskeletal: Negative.  Negative for myalgias.  Skin:  Negative for rash.  Neurological:  Negative for tingling, loss of consciousness and weakness.  Endo/Heme/Allergies:  Negative for polydipsia.     Current Outpatient Medications:    aspirin EC 81 MG tablet, Take 81 mg by mouth daily., Disp: , Rfl:    atorvastatin (LIPITOR) 80 MG tablet, TAKE 1 TABLET(80 MG) BY MOUTH DAILY, Disp: 90 tablet, Rfl: 2   B Complex Vitamins (B COMPLEX-B12) TABS, Take one tablet daily, Disp: 90 tablet, Rfl: 1   clopidogrel (PLAVIX) 75 MG tablet, Take 1 tablet (75 mg total) by mouth daily., Disp: 90 tablet, Rfl: 2   empagliflozin (JARDIANCE) 25 MG TABS tablet, Take 1 tablet (25 mg total) by mouth daily before breakfast., Disp: 30 tablet, Rfl: 5   FEROSUL 325 (65 Fe) MG tablet, TAKE 1 TABLET BY MOUTH EVERY OTHER DAY, Disp: 45 tablet, Rfl: 2   gabapentin (NEURONTIN) 100 MG  capsule, TAKE 2 CAPSULES(200 MG) BY MOUTH AT BEDTIME, Disp: 180 capsule, Rfl: 1   TART CHERRY PO, Take by mouth., Disp: , Rfl:    venlafaxine XR (EFFEXOR-XR) 150 MG 24 hr capsule, TAKE 1 CAPSULE(150 MG) BY MOUTH DAILY WITH BREAKFAST, Disp: 90 capsule, Rfl: 1   metoprolol tartrate (LOPRESSOR) 25 MG tablet, Take 0.5 tablets (12.5 mg total) by mouth 2 (two) times daily., Disp: 90 tablet, Rfl: 3   sildenafil (REVATIO) 20 MG tablet, TAKE 3-5 TABLETS BY MOUTH DAILY 45 MINUTES PRIOR (Patient not taking: Reported on 02/08/2023), Disp: 25 tablet, Rfl: 2 No current facility-administered medications for this visit.  Facility-Administered Medications Ordered in Other Visits:    etomidate (AMIDATE) injection, , , Anesthesia Intra-op, Lucinda Dell, CRNA, 12 mg at 06/09/18 1445   succinylcholine (ANECTINE) injection, , , Anesthesia Intra-op, Lucinda Dell, CRNA, 100 mg at 06/09/18 1445   Objective:     BP 98/62 (BP Location: Right Arm, Patient Position: Sitting, Cuff Size: Normal)   Pulse 65   Temp 97.7 F (36.5 C) (Temporal)   Ht 5\' 7"  (1.702 m)   Wt 188 lb 6.4 oz (85.5 kg)   SpO2 95%   BMI 29.51 kg/m  BP Readings from Last 3 Encounters:  02/08/23 98/62  01/23/23 96/62  08/10/22 110/64   Wt Readings from Last 3 Encounters:  02/08/23 188 lb 6.4 oz (85.5 kg)  01/23/23 191 lb 3.2 oz (86.7 kg)  11/19/22 175 lb (79.4 kg)      Physical Exam Constitutional:      General: He is not in acute distress.    Appearance: Normal appearance. He is not ill-appearing, toxic-appearing or diaphoretic.  HENT:     Head: Normocephalic and atraumatic.     Right Ear: External ear normal.     Left Ear: External ear normal.  Eyes:     General: No scleral icterus.       Right eye: No discharge.        Left eye: No discharge.     Extraocular Movements: Extraocular movements intact.     Conjunctiva/sclera: Conjunctivae normal.  Cardiovascular:     Rate and Rhythm: Normal rate and regular rhythm.   Pulmonary:     Effort: Pulmonary effort is normal. No respiratory distress.     Breath sounds: Normal breath sounds.  Skin:    General: Skin is warm and dry.  Neurological:     Mental Status: He is alert and oriented to person, place, and time.  Psychiatric:        Mood and Affect: Mood normal.        Behavior: Behavior normal.      Results for orders placed or performed in visit on 02/08/23  Basic metabolic panel  Result Value Ref Range   Glucose, Bld 224 (H) 65 - 99 mg/dL   BUN 30 (H) 7 - 25 mg/dL   Creat 4.09 (H) 8.11 - 1.28 mg/dL   BUN/Creatinine Ratio 17 6 - 22 (calc)   Sodium 137 135 - 146 mmol/L   Potassium 4.8 3.5 - 5.3 mmol/L   Chloride 106 98 - 110 mmol/L   CO2 23 20 - 32 mmol/L   Calcium 8.6 8.6 - 10.3 mg/dL  CBC  Result Value Ref Range   WBC 8.8 3.8 - 10.8 Thousand/uL   RBC 4.72 4.20 - 5.80 Million/uL   Hemoglobin 13.8 13.2 - 17.1 g/dL   HCT 91.4 78.2 - 95.6 %   MCV 86.9 80.0 - 100.0 fL   MCH 29.2 27.0 - 33.0 pg   MCHC 33.7 32.0 - 36.0 g/dL   RDW 21.3 08.6 - 57.8 %   Platelets 211 140 - 400 Thousand/uL   MPV 10.2 7.5 - 12.5 fL  Hemoglobin A1c  Result Value Ref Range   Hgb A1c MFr Bld 8.7 (H) <5.7 % of total Hgb   Mean Plasma Glucose 203 mg/dL   eAG (mmol/L) 46.9 mmol/L  Iron, TIBC and Ferritin Panel  Result Value Ref Range   Iron 70 50 - 180 mcg/dL   TIBC 629 528 - 413 mcg/dL (calc)   %SAT 26 20 - 48 % (calc)   Ferritin 168 24 - 380 ng/mL  LDL cholesterol, direct  Result Value Ref Range   Direct LDL 60 <100 mg/dL      The ASCVD Risk score (Arnett DK, et al., 2019) failed to calculate for the following reasons:   The patient has a prior MI or stroke diagnosis    Assessment & Plan:   Prediabetes -     Basic metabolic panel -     Hemoglobin A1c -     Empagliflozin; Take 1 tablet (25 mg total) by mouth daily before breakfast.  Dispense: 30 tablet; Refill: 5  Stage 3b chronic kidney disease (HCC) -     Basic metabolic panel -      Empagliflozin; Take 1 tablet (25  mg total) by mouth daily before breakfast.  Dispense: 30 tablet; Refill: 5  Coronary artery disease due to lipid rich plaque -     Metoprolol Tartrate; Take 0.5 tablets (12.5 mg total) by mouth 2 (two) times daily.  Dispense: 90 tablet; Refill: 3 -     LDL cholesterol, direct  Iron deficiency -     CBC -     Iron, TIBC and Ferritin Panel  Hypotension due to hypovolemia    Return in about 3 months (around 05/11/2023).  Have decreased metoprolol succinate to 12.5 mg twice daily.  Adjustments made to medical regimen pending results of labs.  Mliss Sax, MD

## 2023-02-09 LAB — BASIC METABOLIC PANEL
BUN/Creatinine Ratio: 17 (calc) (ref 6–22)
BUN: 30 mg/dL — ABNORMAL HIGH (ref 7–25)
CO2: 23 mmol/L (ref 20–32)
Calcium: 8.6 mg/dL (ref 8.6–10.3)
Chloride: 106 mmol/L (ref 98–110)
Creat: 1.76 mg/dL — ABNORMAL HIGH (ref 0.70–1.28)
Glucose, Bld: 224 mg/dL — ABNORMAL HIGH (ref 65–99)
Potassium: 4.8 mmol/L (ref 3.5–5.3)
Sodium: 137 mmol/L (ref 135–146)

## 2023-02-09 LAB — LDL CHOLESTEROL, DIRECT: Direct LDL: 60 mg/dL (ref <100)

## 2023-02-09 LAB — CBC
HCT: 41 % (ref 38.5–50.0)
Hemoglobin: 13.8 g/dL (ref 13.2–17.1)
MCH: 29.2 pg (ref 27.0–33.0)
MCV: 86.9 fL (ref 80.0–100.0)
MPV: 10.2 fL (ref 7.5–12.5)
Platelets: 211 10*3/uL (ref 140–400)
RDW: 13 % (ref 11.0–15.0)

## 2023-02-09 LAB — IRON,TIBC AND FERRITIN PANEL
%SAT: 26 % (calc) (ref 20–48)
Ferritin: 168 ng/mL (ref 24–380)
Iron: 70 ug/dL (ref 50–180)
TIBC: 272 mcg/dL (calc) (ref 250–425)

## 2023-02-09 LAB — HEMOGLOBIN A1C
Hgb A1c MFr Bld: 8.7 % of total Hgb — ABNORMAL HIGH (ref <5.7)
Mean Plasma Glucose: 203 mg/dL
eAG (mmol/L): 11.2 mmol/L

## 2023-02-12 MED ORDER — EMPAGLIFLOZIN 25 MG PO TABS
25.0000 mg | ORAL_TABLET | Freq: Every day | ORAL | 5 refills | Status: DC
Start: 2023-02-12 — End: 2023-08-07

## 2023-02-12 NOTE — Addendum Note (Signed)
Addended by: Andrez Grime on: 02/12/2023 04:27 PM   Modules accepted: Orders

## 2023-03-06 ENCOUNTER — Telehealth: Payer: Self-pay

## 2023-03-06 NOTE — Patient Outreach (Signed)
  Care Coordination   03/06/2023 Name: Ian Ramos MRN: [id number] DOB: [birth year]   Care Coordination Outreach Attempts:  An unsuccessful telephone outreach was attempted today to offer the patient information about available care coordination services.  Follow Up Plan:  Additional outreach attempts will be made to offer the patient care coordination information and services.   Encounter Outcome:  No Answer   Care Coordination Interventions:  No, not indicated    Bary Leriche, RN, MSN [facility] Care Management Care Management Coordinator Direct Line [phone]

## 2023-03-31 ENCOUNTER — Other Ambulatory Visit: Payer: Self-pay | Admitting: Family Medicine

## 2023-04-03 DIAGNOSIS — C44319 Basal cell carcinoma of skin of other parts of face: Secondary | ICD-10-CM | POA: Diagnosis not present

## 2023-04-03 DIAGNOSIS — D485 Neoplasm of uncertain behavior of skin: Secondary | ICD-10-CM | POA: Diagnosis not present

## 2023-04-03 DIAGNOSIS — C44519 Basal cell carcinoma of skin of other part of trunk: Secondary | ICD-10-CM | POA: Diagnosis not present

## 2023-04-10 DIAGNOSIS — L905 Scar conditions and fibrosis of skin: Secondary | ICD-10-CM | POA: Diagnosis not present

## 2023-04-30 ENCOUNTER — Other Ambulatory Visit: Payer: Self-pay | Admitting: Family Medicine

## 2023-04-30 DIAGNOSIS — R5382 Chronic fatigue, unspecified: Secondary | ICD-10-CM

## 2023-04-30 DIAGNOSIS — F321 Major depressive disorder, single episode, moderate: Secondary | ICD-10-CM

## 2023-05-10 ENCOUNTER — Encounter: Payer: Self-pay | Admitting: Family Medicine

## 2023-05-10 ENCOUNTER — Ambulatory Visit (INDEPENDENT_AMBULATORY_CARE_PROVIDER_SITE_OTHER): Payer: Medicare Other | Admitting: Family Medicine

## 2023-05-10 VITALS — BP 134/86 | HR 69 | Temp 97.9°F | Ht 67.0 in | Wt 190.4 lb

## 2023-05-10 DIAGNOSIS — E611 Iron deficiency: Secondary | ICD-10-CM

## 2023-05-10 DIAGNOSIS — E538 Deficiency of other specified B group vitamins: Secondary | ICD-10-CM

## 2023-05-10 DIAGNOSIS — R7303 Prediabetes: Secondary | ICD-10-CM

## 2023-05-10 DIAGNOSIS — N1832 Chronic kidney disease, stage 3b: Secondary | ICD-10-CM

## 2023-05-10 DIAGNOSIS — E559 Vitamin D deficiency, unspecified: Secondary | ICD-10-CM

## 2023-05-10 NOTE — Progress Notes (Signed)
Established Patient Office Visit   Subjective:  Patient ID: Ian Ramos, male    DOB: [birth year]  Age: 77 y.o. MRN: [id number]  No chief complaint on file.   HPI Encounter Diagnoses  Name Primary?   Prediabetes Yes   Stage 3b chronic kidney disease (HCC)    Iron deficiency    B12 deficiency    Vitamin D deficiency    For follow-up of above.  Doing well.  There has been some increase in fatigue.  Taking all medications as directed.  He brings them all in today.  Accompanied by his daughter, Leta Jungling   Review of Systems  Constitutional: Negative.   HENT: Negative.    Eyes:  Negative for blurred vision, discharge and redness.  Respiratory: Negative.    Cardiovascular: Negative.   Gastrointestinal:  Negative for abdominal pain.  Genitourinary: Negative.   Musculoskeletal: Negative.  Negative for myalgias.  Skin:  Negative for rash.  Neurological:  Negative for tingling, loss of consciousness and weakness.  Endo/Heme/Allergies:  Negative for polydipsia.     Current Outpatient Medications:    aspirin EC 81 MG tablet, Take 81 mg by mouth daily., Disp: , Rfl:    atorvastatin (LIPITOR) 80 MG tablet, TAKE 1 TABLET(80 MG) BY MOUTH DAILY, Disp: 90 tablet, Rfl: 2   B Complex Vitamins (B COMPLEX-B12) TABS, Take one tablet daily, Disp: 90 tablet, Rfl: 1   clopidogrel (PLAVIX) 75 MG tablet, Take 1 tablet (75 mg total) by mouth daily., Disp: 90 tablet, Rfl: 2   empagliflozin (JARDIANCE) 25 MG TABS tablet, Take 1 tablet (25 mg total) by mouth daily before breakfast., Disp: 30 tablet, Rfl: 5   FEROSUL 325 (65 Fe) MG tablet, TAKE 1 TABLET BY MOUTH EVERY OTHER DAY, Disp: 45 tablet, Rfl: 2   gabapentin (NEURONTIN) 100 MG capsule, TAKE 2 CAPSULES(200 MG) BY MOUTH AT BEDTIME, Disp: 180 capsule, Rfl: 1   metoprolol tartrate (LOPRESSOR) 25 MG tablet, Take 0.5 tablets (12.5 mg total) by mouth 2 (two) times daily., Disp: 90 tablet, Rfl: 3   TART CHERRY PO, Take by mouth., Disp: , Rfl:     venlafaxine XR (EFFEXOR-XR) 150 MG 24 hr capsule, TAKE 1 CAPSULE(150 MG) BY MOUTH DAILY WITH BREAKFAST, Disp: 90 capsule, Rfl: 1   sildenafil (REVATIO) 20 MG tablet, TAKE 3-5 TABLETS BY MOUTH DAILY 45 MINUTES PRIOR (Patient not taking: Reported on 02/08/2023), Disp: 25 tablet, Rfl: 2 No current facility-administered medications for this visit.  Facility-Administered Medications Ordered in Other Visits:    etomidate (AMIDATE) injection, , , Anesthesia Intra-op, Lucinda Dell, CRNA, 12 mg at 06/09/18 1445   succinylcholine (ANECTINE) injection, , , Anesthesia Intra-op, Lucinda Dell, CRNA, 100 mg at 06/09/18 1445   Objective:     BP 134/86   Pulse 69   Temp 97.9 F (36.6 C)   Ht 5\' 7"  (1.702 m)   Wt 190 lb 6.4 oz (86.4 kg)   SpO2 98%   BMI 29.82 kg/m    Physical Exam Constitutional:      General: He is not in acute distress.    Appearance: Normal appearance. He is not ill-appearing, toxic-appearing or diaphoretic.  HENT:     Head: Normocephalic and atraumatic.     Right Ear: External ear normal.     Left Ear: External ear normal.  Eyes:     General: No scleral icterus.       Right eye: No discharge.        Left eye: No  discharge.     Extraocular Movements: Extraocular movements intact.     Conjunctiva/sclera: Conjunctivae normal.     Pupils: Pupils are equal, round, and reactive to light.  Cardiovascular:     Rate and Rhythm: Normal rate and regular rhythm.  Pulmonary:     Effort: Pulmonary effort is normal. No respiratory distress.     Breath sounds: No wheezing, rhonchi or rales.  Skin:    General: Skin is warm and dry.  Neurological:     Mental Status: He is alert and oriented to person, place, and time.  Psychiatric:        Mood and Affect: Mood normal.        Behavior: Behavior normal.      No results found for any visits on 05/10/23.    The ASCVD Risk score (Arnett DK, et al., 2019) failed to calculate for the following reasons:   The patient has  a prior MI or stroke diagnosis    Assessment & Plan:   Prediabetes -     Basic metabolic panel -     Hemoglobin A1c -     Microalbumin / creatinine urine ratio -     Urinalysis, Routine w reflex microscopic  Stage 3b chronic kidney disease (HCC) -     Basic metabolic panel  Iron deficiency -     Ferritin  B12 deficiency -     Vitamin B12  Vitamin D deficiency -     VITAMIN D 25 Hydroxy (Vit-D Deficiency, Fractures)    Return in about 3 months (around 08/10/2023).  Checking all of above.  Encouraged adequate hydration.  Advised that he minimize his time in the hot part of the day.  Mliss Sax, MD

## 2023-05-15 ENCOUNTER — Telehealth: Payer: Self-pay

## 2023-05-15 NOTE — Telephone Encounter (Signed)
Complete

## 2023-05-21 ENCOUNTER — Ambulatory Visit (INDEPENDENT_AMBULATORY_CARE_PROVIDER_SITE_OTHER): Payer: Medicare Other | Admitting: Family Medicine

## 2023-05-21 ENCOUNTER — Encounter: Payer: Self-pay | Admitting: Family Medicine

## 2023-05-21 VITALS — BP 112/78 | HR 61 | Temp 97.8°F | Ht 67.0 in | Wt 188.6 lb

## 2023-05-21 DIAGNOSIS — E1165 Type 2 diabetes mellitus with hyperglycemia: Secondary | ICD-10-CM

## 2023-05-21 DIAGNOSIS — Z7984 Long term (current) use of oral hypoglycemic drugs: Secondary | ICD-10-CM | POA: Diagnosis not present

## 2023-05-21 MED ORDER — RYBELSUS 7 MG PO TABS
7.0000 mg | ORAL_TABLET | Freq: Every day | ORAL | 2 refills | Status: DC
Start: 2023-05-21 — End: 2023-08-08

## 2023-05-21 NOTE — Progress Notes (Signed)
Established Patient Office Visit   Subjective:  Patient ID: Ian Ramos, male    DOB: 11-21-1945  Age: 77 y.o. MRN: [id number]  Chief Complaint  Patient presents with   Medical Management of Chronic Issues    Lab review. Pt A1c has increased.     HPI Encounter Diagnoses  Name Primary?   Poorly controlled type 2 diabetes mellitus (HCC) Yes   For follow-up of a rising trend seen in his A1c.  1 from 8.73 months ago to 9.1 11 days ago.  Had been 5.7 1-year ago.  Admits to eating more chips with salt to keep his blood pressure up.  Last GFR was 35.  Glucose was noted in his urinalysis as expected.   Review of Systems  Constitutional: Negative.   HENT: Negative.    Eyes:  Negative for blurred vision, discharge and redness.  Respiratory: Negative.    Cardiovascular: Negative.   Gastrointestinal:  Negative for abdominal pain.  Genitourinary: Negative.   Musculoskeletal: Negative.  Negative for myalgias.  Skin:  Negative for rash.  Neurological:  Negative for tingling, loss of consciousness and weakness.  Endo/Heme/Allergies:  Negative for polydipsia.     Current Outpatient Medications:    aspirin EC 81 MG tablet, Take 81 mg by mouth daily., Disp: , Rfl:    atorvastatin (LIPITOR) 80 MG tablet, TAKE 1 TABLET(80 MG) BY MOUTH DAILY, Disp: 90 tablet, Rfl: 2   B Complex Vitamins (B COMPLEX-B12) TABS, Take one tablet daily, Disp: 90 tablet, Rfl: 1   clopidogrel (PLAVIX) 75 MG tablet, Take 1 tablet (75 mg total) by mouth daily., Disp: 90 tablet, Rfl: 2   empagliflozin (JARDIANCE) 25 MG TABS tablet, Take 1 tablet (25 mg total) by mouth daily before breakfast., Disp: 30 tablet, Rfl: 5   FEROSUL 325 (65 Fe) MG tablet, TAKE 1 TABLET BY MOUTH EVERY OTHER DAY, Disp: 45 tablet, Rfl: 2   gabapentin (NEURONTIN) 100 MG capsule, TAKE 2 CAPSULES(200 MG) BY MOUTH AT BEDTIME, Disp: 180 capsule, Rfl: 1   metoprolol tartrate (LOPRESSOR) 25 MG tablet, Take 0.5 tablets (12.5 mg total) by mouth 2  (two) times daily., Disp: 90 tablet, Rfl: 3   Semaglutide (RYBELSUS) 7 MG TABS, Take 1 tablet (7 mg total) by mouth daily., Disp: 30 tablet, Rfl: 2   sildenafil (REVATIO) 20 MG tablet, TAKE 3-5 TABLETS BY MOUTH DAILY 45 MINUTES PRIOR, Disp: 25 tablet, Rfl: 2   TART CHERRY PO, Take by mouth., Disp: , Rfl:    venlafaxine XR (EFFEXOR-XR) 150 MG 24 hr capsule, TAKE 1 CAPSULE(150 MG) BY MOUTH DAILY WITH BREAKFAST, Disp: 90 capsule, Rfl: 1 No current facility-administered medications for this visit.  Facility-Administered Medications Ordered in Other Visits:    etomidate (AMIDATE) injection, , , Anesthesia Intra-op, Lucinda Dell, CRNA, 12 mg at 06/09/18 1445   succinylcholine (ANECTINE) injection, , , Anesthesia Intra-op, Lucinda Dell, CRNA, 100 mg at 06/09/18 1445   Objective:     BP 112/78   Pulse 61   Temp 97.8 F (36.6 C)   Ht 5\' 7"  (1.702 m)   Wt 188 lb 9.6 oz (85.5 kg)   SpO2 97%   BMI 29.54 kg/m  Wt Readings from Last 3 Encounters:  05/21/23 188 lb 9.6 oz (85.5 kg)  05/10/23 190 lb 6.4 oz (86.4 kg)  02/08/23 188 lb 6.4 oz (85.5 kg)      Physical Exam Constitutional:      General: He is not in acute distress.  Appearance: Normal appearance. He is not ill-appearing, toxic-appearing or diaphoretic.  HENT:     Head: Normocephalic and atraumatic.     Right Ear: External ear normal.     Left Ear: External ear normal.  Eyes:     General: No scleral icterus.       Right eye: No discharge.        Left eye: No discharge.     Extraocular Movements: Extraocular movements intact.     Conjunctiva/sclera: Conjunctivae normal.  Pulmonary:     Effort: Pulmonary effort is normal. No respiratory distress.  Skin:    General: Skin is warm and dry.  Neurological:     Mental Status: He is alert and oriented to person, place, and time.  Psychiatric:        Mood and Affect: Mood normal.        Behavior: Behavior normal.      No results found for any visits on  05/21/23.    The ASCVD Risk score (Arnett DK, et al., 2019) failed to calculate for the following reasons:   The patient has a prior MI or stroke diagnosis    Assessment & Plan:   Poorly controlled type 2 diabetes mellitus (HCC) -     Rybelsus; Take 1 tablet (7 mg total) by mouth daily.  Dispense: 30 tablet; Refill: 2    Return in about 3 months (around 08/21/2023).  He will continue empagliflozin.  Add Rybelsus.  Discussed lowering the carbohydrates in his diet by avoiding sugary drinks and simple carbohydrates such as sweets and desserts.  Information was given on carbohydrate counting for diabetes.  Mliss Sax, MD

## 2023-05-23 DIAGNOSIS — K080 Exfoliation of teeth due to systemic causes: Secondary | ICD-10-CM | POA: Diagnosis not present

## 2023-06-04 DIAGNOSIS — C44519 Basal cell carcinoma of skin of other part of trunk: Secondary | ICD-10-CM | POA: Diagnosis not present

## 2023-06-07 ENCOUNTER — Encounter: Payer: Self-pay | Admitting: Family Medicine

## 2023-06-11 ENCOUNTER — Telehealth: Payer: Self-pay | Admitting: Family Medicine

## 2023-06-11 DIAGNOSIS — L905 Scar conditions and fibrosis of skin: Secondary | ICD-10-CM | POA: Diagnosis not present

## 2023-06-11 NOTE — Telephone Encounter (Signed)
Pt walked in stating he thinks he is having a reaction to his medication. He stopped taking it once he threw up. He feels it's his prescription Semaglutide (RYBELSUS) 7 MG TABS [[id number]]. I called nurse triage, they  asked me if had thrown up blood or urinated today. He has not thrown up blood and he has urinated. They will call him back shortly and he is going to keep an eye on his phone. He knows to go to urgent care if it gets worse.

## 2023-06-13 ENCOUNTER — Encounter: Payer: Self-pay | Admitting: Family Medicine

## 2023-06-13 ENCOUNTER — Ambulatory Visit (INDEPENDENT_AMBULATORY_CARE_PROVIDER_SITE_OTHER): Payer: Medicare Other | Admitting: Family Medicine

## 2023-06-13 VITALS — BP 114/78 | HR 72 | Temp 97.2°F | Ht 67.0 in | Wt 182.4 lb

## 2023-06-13 DIAGNOSIS — E1165 Type 2 diabetes mellitus with hyperglycemia: Secondary | ICD-10-CM

## 2023-06-13 DIAGNOSIS — Z7984 Long term (current) use of oral hypoglycemic drugs: Secondary | ICD-10-CM | POA: Diagnosis not present

## 2023-06-13 MED ORDER — METFORMIN HCL ER 500 MG PO TB24
500.0000 mg | ORAL_TABLET | Freq: Every evening | ORAL | 2 refills | Status: DC
Start: 2023-06-13 — End: 2023-08-09

## 2023-06-13 MED ORDER — METFORMIN HCL ER 500 MG PO TB24: 500.0000 mg | ORAL_TABLET | Freq: Every evening | ORAL | 2 refills | Status: DC

## 2023-06-13 NOTE — Progress Notes (Signed)
Established Patient Office Visit   Subjective:  Patient ID: Ian Ramos, male    DOB: November 29, 1945  Age: 77 y.o. MRN: [id number]  Chief Complaint  Patient presents with   Medical Management of Chronic Issues    Pt states he stopped taking Rybelsus because it made him vomit every time her took it even if he switch the time he takes it.     HPI Encounter Diagnoses  Name Primary?   Poorly controlled type 2 diabetes mellitus (HCC) Yes   Experienced nausea and vomiting after taking Rybelsus.  He waited a few days and restarted it and had the same reaction.  He has reduced his carbohydrate intake.  He is no longer consuming chips or sweets.  He drinks 112 ounce can of Pepsi daily.  He had taken Rybelsus in the past and tolerated it well.  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   Review of Systems  Constitutional: Negative.   HENT: Negative.    Eyes:  Negative for blurred vision, discharge and redness.  Respiratory: Negative.    Cardiovascular: Negative.   Gastrointestinal:  Negative for abdominal pain.  Genitourinary: Negative.   Musculoskeletal: Negative.  Negative for myalgias.  Skin:  Negative for rash.  Neurological:  Negative for tingling, loss of consciousness and weakness.  Endo/Heme/Allergies:  Negative for polydipsia.     Current Outpatient Medications:    aspirin EC 81 MG tablet, Take 81 mg by mouth daily., Disp: , Rfl:    atorvastatin (LIPITOR) 80 MG tablet, TAKE 1 TABLET(80 MG) BY MOUTH DAILY, Disp: 90 tablet, Rfl: 2   B Complex Vitamins (B COMPLEX-B12) TABS, Take one tablet daily, Disp: 90 tablet, Rfl: 1   clopidogrel (PLAVIX) 75 MG tablet, Take 1 tablet (75 mg total) by mouth daily., Disp: 90 tablet, Rfl: 2   empagliflozin (JARDIANCE) 25 MG TABS tablet, Take 1 tablet (25 mg total) by mouth daily before breakfast., Disp: 30 tablet, Rfl: 5   FEROSUL 325 (65 Fe) MG tablet, TAKE 1 TABLET BY MOUTH EVERY OTHER DAY, Disp: 45 tablet, Rfl: 2   gabapentin (NEURONTIN) 100 MG  capsule, TAKE 2 CAPSULES(200 MG) BY MOUTH AT BEDTIME, Disp: 180 capsule, Rfl: 1   Semaglutide (RYBELSUS) 7 MG TABS, Take 1 tablet (7 mg total) by mouth daily., Disp: 30 tablet, Rfl: 2   sildenafil (REVATIO) 20 MG tablet, TAKE 3-5 TABLETS BY MOUTH DAILY 45 MINUTES PRIOR, Disp: 25 tablet, Rfl: 2   TART CHERRY PO, Take by mouth., Disp: , Rfl:    venlafaxine XR (EFFEXOR-XR) 150 MG 24 hr capsule, TAKE 1 CAPSULE(150 MG) BY MOUTH DAILY WITH BREAKFAST, Disp: 90 capsule, Rfl: 1   metFORMIN (GLUCOPHAGE-XR) 500 MG 24 hr tablet, Take 1 tablet (500 mg total) by mouth at bedtime., Disp: 30 tablet, Rfl: 2   metoprolol tartrate (LOPRESSOR) 25 MG tablet, Take 0.5 tablets (12.5 mg total) by mouth 2 (two) times daily., Disp: 90 tablet, Rfl: 3 No current facility-administered medications for this visit.  Facility-Administered Medications Ordered in Other Visits:    etomidate (AMIDATE) injection, , , Anesthesia Intra-op, Decarlo, Cristy Friedlander, CRNA, 12 mg at 06/09/18 1445   succinylcholine (ANECTINE) injection, , , Anesthesia Intra-op, Lucinda Dell, CRNA, 100 mg at 06/09/18 1445   Objective:     BP 114/78   Pulse 72   Temp (!) 97.2 F (36.2 C)   Ht 5\' 7"  (1.702 m)   Wt 182 lb 6.4 oz (82.7 kg)   SpO2 99%   BMI 28.57  kg/m    Physical Exam Constitutional:      General: He is not in acute distress.    Appearance: Normal appearance. He is not ill-appearing, toxic-appearing or diaphoretic.  HENT:     Head: Normocephalic and atraumatic.     Right Ear: External ear normal.     Left Ear: External ear normal.  Eyes:     General: No scleral icterus.       Right eye: No discharge.        Left eye: No discharge.     Extraocular Movements: Extraocular movements intact.     Conjunctiva/sclera: Conjunctivae normal.  Pulmonary:     Effort: Pulmonary effort is normal. No respiratory distress.  Skin:    General: Skin is warm and dry.  Neurological:     Mental Status: He is alert and oriented to person,  place, and time.  Psychiatric:        Mood and Affect: Mood normal.        Behavior: Behavior normal.      No results found for any visits on 06/13/23.    The ASCVD Risk score (Arnett DK, et al., 2019) failed to calculate for the following reasons:   The patient has a prior MI or stroke diagnosis    Assessment & Plan:   Poorly controlled type 2 diabetes mellitus (HCC) -     metFORMIN HCl ER; Take 1 tablet (500 mg total) by mouth at bedtime.  Dispense: 30 tablet; Refill: 2    Return in about 8 weeks (around 08/08/2023), or Start Glucophage and continue empagliflozin.  Has appointment on November 7..  Advised that he could experience abdominal cramping and loose stools.  He understands that for most people this passes after a few weeks.   Mliss Sax, MD

## 2023-07-11 DIAGNOSIS — L814 Other melanin hyperpigmentation: Secondary | ICD-10-CM | POA: Diagnosis not present

## 2023-07-11 DIAGNOSIS — L821 Other seborrheic keratosis: Secondary | ICD-10-CM | POA: Diagnosis not present

## 2023-07-11 DIAGNOSIS — D1801 Hemangioma of skin and subcutaneous tissue: Secondary | ICD-10-CM | POA: Diagnosis not present

## 2023-07-11 DIAGNOSIS — D692 Other nonthrombocytopenic purpura: Secondary | ICD-10-CM | POA: Diagnosis not present

## 2023-07-29 ENCOUNTER — Other Ambulatory Visit: Payer: Self-pay | Admitting: Family Medicine

## 2023-07-29 DIAGNOSIS — M542 Cervicalgia: Secondary | ICD-10-CM

## 2023-08-07 ENCOUNTER — Other Ambulatory Visit: Payer: Self-pay | Admitting: Family Medicine

## 2023-08-07 DIAGNOSIS — N1832 Chronic kidney disease, stage 3b: Secondary | ICD-10-CM

## 2023-08-07 DIAGNOSIS — R7303 Prediabetes: Secondary | ICD-10-CM

## 2023-08-08 ENCOUNTER — Ambulatory Visit (INDEPENDENT_AMBULATORY_CARE_PROVIDER_SITE_OTHER): Payer: Medicare Other | Admitting: Family Medicine

## 2023-08-08 ENCOUNTER — Encounter: Payer: Self-pay | Admitting: Family Medicine

## 2023-08-08 VITALS — BP 122/66 | HR 68 | Temp 97.9°F | Ht 66.5 in | Wt 186.2 lb

## 2023-08-08 DIAGNOSIS — I2583 Coronary atherosclerosis due to lipid rich plaque: Secondary | ICD-10-CM

## 2023-08-08 DIAGNOSIS — N1832 Chronic kidney disease, stage 3b: Secondary | ICD-10-CM | POA: Diagnosis not present

## 2023-08-08 DIAGNOSIS — F321 Major depressive disorder, single episode, moderate: Secondary | ICD-10-CM

## 2023-08-08 DIAGNOSIS — E1165 Type 2 diabetes mellitus with hyperglycemia: Secondary | ICD-10-CM | POA: Diagnosis not present

## 2023-08-08 DIAGNOSIS — Z7984 Long term (current) use of oral hypoglycemic drugs: Secondary | ICD-10-CM

## 2023-08-08 DIAGNOSIS — I2510 Atherosclerotic heart disease of native coronary artery without angina pectoris: Secondary | ICD-10-CM

## 2023-08-08 MED ORDER — METOPROLOL TARTRATE 25 MG PO TABS
12.5000 mg | ORAL_TABLET | Freq: Two times a day (BID) | ORAL | 3 refills | Status: DC
Start: 2023-08-08 — End: 2023-08-08

## 2023-08-08 MED ORDER — METOPROLOL TARTRATE 25 MG PO TABS
12.5000 mg | ORAL_TABLET | Freq: Two times a day (BID) | ORAL | 3 refills | Status: DC
Start: 2023-08-08 — End: 2024-03-09

## 2023-08-08 NOTE — Progress Notes (Addendum)
Established Patient Office Visit   Subjective:  Patient ID: Ian Ramos, male    DOB: 1945/10/15  Age: 77 y.o. MRN: [id number]  Chief Complaint  Patient presents with   Follow-up    HPI Encounter Diagnoses  Name Primary?   Poorly controlled type 2 diabetes mellitus (HCC) Yes   Stage 3b chronic kidney disease (HCC)    Depression, major, single episode, moderate (HCC)    Coronary artery disease due to lipid rich plaque    for follow-up of above.  Continues Jardiance without issue for diabetes and 3B CKD.  Having no problems taking metformin once daily in the evening.  Continues with atorvastatin for elevated cholesterol and CAD.  Continues venlafaxine for depression.   Review of Systems  Constitutional: Negative.   HENT: Negative.    Eyes:  Negative for blurred vision, discharge and redness.  Respiratory: Negative.    Cardiovascular: Negative.   Gastrointestinal:  Negative for abdominal pain.  Genitourinary: Negative.   Musculoskeletal: Negative.  Negative for myalgias.  Skin:  Negative for rash.  Neurological:  Negative for tingling, loss of consciousness and weakness.  Endo/Heme/Allergies:  Negative for polydipsia.     Current Outpatient Medications:    aspirin EC 81 MG tablet, Take 81 mg by mouth daily., Disp: , Rfl:    atorvastatin (LIPITOR) 80 MG tablet, TAKE 1 TABLET(80 MG) BY MOUTH DAILY, Disp: 90 tablet, Rfl: 2   B Complex Vitamins (B COMPLEX-B12) TABS, Take one tablet daily, Disp: 90 tablet, Rfl: 1   clopidogrel (PLAVIX) 75 MG tablet, Take 1 tablet (75 mg total) by mouth daily., Disp: 90 tablet, Rfl: 2   FEROSUL 325 (65 Fe) MG tablet, TAKE 1 TABLET BY MOUTH EVERY OTHER DAY, Disp: 45 tablet, Rfl: 2   gabapentin (NEURONTIN) 100 MG capsule, TAKE 2 CAPSULES(200 MG) BY MOUTH AT BEDTIME, Disp: 180 capsule, Rfl: 1   JARDIANCE 25 MG TABS tablet, TAKE 1 TABLET(25 MG) BY MOUTH DAILY BEFORE BREAKFAST, Disp: 30 tablet, Rfl: 5   metFORMIN (GLUCOPHAGE-XR) 500 MG 24 hr  tablet, Take 1 tablet (500 mg total) by mouth 2 (two) times daily with a meal., Disp: 180 tablet, Rfl: 1   venlafaxine XR (EFFEXOR-XR) 150 MG 24 hr capsule, TAKE 1 CAPSULE(150 MG) BY MOUTH DAILY WITH BREAKFAST, Disp: 90 capsule, Rfl: 1   metoprolol tartrate (LOPRESSOR) 25 MG tablet, Take 0.5 tablets (12.5 mg total) by mouth 2 (two) times daily., Disp: 90 tablet, Rfl: 3   sildenafil (REVATIO) 20 MG tablet, TAKE 3-5 TABLETS BY MOUTH DAILY 45 MINUTES PRIOR (Patient not taking: Reported on 08/08/2023), Disp: 25 tablet, Rfl: 2   Objective:     BP 122/66   Pulse 68   Temp 97.9 F (36.6 C) (Temporal)   Ht 5' 6.5" (1.689 m)   Wt 186 lb 3.2 oz (84.5 kg)   SpO2 98%   BMI 29.60 kg/m  Wt Readings from Last 3 Encounters:  08/08/23 186 lb 3.2 oz (84.5 kg)  06/13/23 182 lb 6.4 oz (82.7 kg)  05/21/23 188 lb 9.6 oz (85.5 kg)      Physical Exam Constitutional:      General: He is not in acute distress.    Appearance: Normal appearance. He is not ill-appearing, toxic-appearing or diaphoretic.  HENT:     Head: Normocephalic and atraumatic.     Right Ear: External ear normal.     Left Ear: External ear normal.  Eyes:     General: No scleral icterus.  Right eye: No discharge.        Left eye: No discharge.     Extraocular Movements: Extraocular movements intact.     Conjunctiva/sclera: Conjunctivae normal.  Pulmonary:     Effort: Pulmonary effort is normal. No respiratory distress.  Skin:    General: Skin is warm and dry.  Neurological:     Mental Status: He is alert and oriented to person, place, and time.  Psychiatric:        Mood and Affect: Mood normal.        Behavior: Behavior normal.      Results for orders placed or performed in visit on 08/08/23  Basic metabolic panel  Result Value Ref Range   Sodium 140 135 - 145 mEq/L   Potassium 4.3 3.5 - 5.1 mEq/L   Chloride 108 96 - 112 mEq/L   CO2 24 19 - 32 mEq/L   Glucose, Bld 157 (H) 70 - 99 mg/dL   BUN 23 6 - 23 mg/dL    Creatinine, Ser 5.36 (H) 0.40 - 1.50 mg/dL   GFR 64.40 (L) >34.74 mL/min   Calcium 9.4 8.4 - 10.5 mg/dL  Hemoglobin Q5Z  Result Value Ref Range   Hgb A1c MFr Bld 8.5 (H) 4.6 - 6.5 %      The ASCVD Risk score (Arnett DK, et al., 2019) failed to calculate for the following reasons:   The patient has a prior MI or stroke diagnosis    Assessment & Plan:   Poorly controlled type 2 diabetes mellitus (HCC) -     Basic metabolic panel -     Hemoglobin A1c -     metFORMIN HCl ER; Take 1 tablet (500 mg total) by mouth 2 (two) times daily with a meal.  Dispense: 180 tablet; Refill: 1  Stage 3b chronic kidney disease (HCC) -     Basic metabolic panel  Depression, major, single episode, moderate (HCC)  Coronary artery disease due to lipid rich plaque -     Metoprolol Tartrate; Take 0.5 tablets (12.5 mg total) by mouth 2 (two) times daily.  Dispense: 90 tablet; Refill: 3    Return in about 3 months (around 11/08/2023).  Advised that we may need to increase the metformin dosing.  A1c is pending.  Send a special note to pharmacy regarding correct dosing of metoprolol which is 12.5 twice daily.  Mliss Sax, MD  11/8 addendum: Have increased metformin to twice daily

## 2023-08-09 ENCOUNTER — Ambulatory Visit: Payer: Medicare Other | Admitting: Family Medicine

## 2023-08-09 LAB — BASIC METABOLIC PANEL
BUN: 23 mg/dL (ref 6–23)
CO2: 24 meq/L (ref 19–32)
Calcium: 9.4 mg/dL (ref 8.4–10.5)
Chloride: 108 meq/L (ref 96–112)
Creatinine, Ser: 1.83 mg/dL — ABNORMAL HIGH (ref 0.40–1.50)
GFR: 34.79 mL/min — ABNORMAL LOW (ref >60.00)
Glucose, Bld: 157 mg/dL — ABNORMAL HIGH (ref 70–99)
Potassium: 4.3 meq/L (ref 3.5–5.1)
Sodium: 140 meq/L (ref 135–145)

## 2023-08-09 LAB — HEMOGLOBIN A1C: Hgb A1c MFr Bld: 8.5 % — ABNORMAL HIGH (ref 4.6–6.5)

## 2023-08-09 MED ORDER — METFORMIN HCL ER 500 MG PO TB24: 500.0000 mg | ORAL_TABLET | Freq: Two times a day (BID) | ORAL | 1 refills | Status: DC

## 2023-08-09 NOTE — Addendum Note (Signed)
Addended by: Nadene Rubins A on: 08/09/2023 11:52 AM   Modules accepted: Orders

## 2023-09-27 ENCOUNTER — Other Ambulatory Visit: Payer: Self-pay | Admitting: Family Medicine

## 2023-09-27 DIAGNOSIS — D649 Anemia, unspecified: Secondary | ICD-10-CM

## 2023-10-07 DIAGNOSIS — N1832 Chronic kidney disease, stage 3b: Secondary | ICD-10-CM

## 2023-10-07 DIAGNOSIS — E1165 Type 2 diabetes mellitus with hyperglycemia: Secondary | ICD-10-CM

## 2023-10-08 DIAGNOSIS — K080 Exfoliation of teeth due to systemic causes: Secondary | ICD-10-CM | POA: Diagnosis not present

## 2023-10-09 NOTE — Telephone Encounter (Signed)
 Copied from CRM [id number]. Topic: Clinical - Medication Question >> Oct 08, 2023  5:01 PM Taleah C wrote: Reason for CRM: pt called and stated that his medication for Jardiance  has went over $420 for 30 days. Pt would like to discuss alternative options as he stated he cannot afford that price. He asked for a CB at [phone]. Please call and advise.

## 2023-10-10 NOTE — Addendum Note (Signed)
 Addended by: Andrez Grime on: 10/10/2023 02:24 PM   Modules accepted: Orders

## 2023-10-11 ENCOUNTER — Telehealth: Payer: Self-pay

## 2023-10-11 NOTE — Progress Notes (Signed)
 Care Guide Pharmacy Note  10/11/2023 Name: Ian Ramos MRN: [id number] DOB: [birth year]  Referred By: Berneta Elsie Sayre, MD Reason for referral: Care Coordination (Outreach to schedule with Pharm d )   Ian Ramos is a 78 y.o. year old male who is a primary care patient of Berneta Elsie Sayre, MD.  Ian Ramos was referred to the pharmacist for assistance related to: DMII and CKD Stage 3  An unsuccessful telephone outreach was attempted today to contact the patient who was referred to the pharmacy team for assistance with medication assistance. Additional attempts will be made to contact the patient.  Jeoffrey Buffalo , RMA     [facility]  [facility], [facility] Guide  Direct Dial: [phone]  Website: [email or web address]

## 2023-10-23 ENCOUNTER — Telehealth: Payer: Self-pay

## 2023-10-23 NOTE — Progress Notes (Signed)
Care Guide Pharmacy Note  10/23/2023 Name: [name] MRN: [id number] DOB: [birth year]  Referred By: Mliss Sax, MD Reason for referral: Care Coordination (Outreach to schedule with Pharm d )   Ian Ramos is a 78 y.o. year old male who is a primary care patient of Mliss Sax, MD.  Ian Ramos was referred to the pharmacist for assistance related to: CKD Stage 3  Successful contact was made with the patient to discuss pharmacy services including being ready for the pharmacist to call at least 5 minutes before the scheduled appointment time and to have medication bottles and any blood pressure readings ready for review. The patient agreed to meet with the pharmacist via telephone visit on (date/time).10/30/2023  Penne Lash , RMA     [place]  [facility], [facility] Guide  Direct Dial: [phone]  Website: [email or web address]

## 2023-10-23 NOTE — Telephone Encounter (Signed)
Copied from CRM [id number]. Topic: General - Call Back - No Documentation >> Oct 23, 2023 11:57 AM Florestine Avers wrote: Reason for CRM: Patient called in requesting to speak to a dietician. Patient is requesting a call back.

## 2023-10-27 ENCOUNTER — Other Ambulatory Visit: Payer: Self-pay | Admitting: Family Medicine

## 2023-10-27 DIAGNOSIS — R5382 Chronic fatigue, unspecified: Secondary | ICD-10-CM

## 2023-10-27 DIAGNOSIS — F321 Major depressive disorder, single episode, moderate: Secondary | ICD-10-CM

## 2023-10-28 ENCOUNTER — Other Ambulatory Visit: Payer: Self-pay | Admitting: Family Medicine

## 2023-10-28 DIAGNOSIS — I2510 Atherosclerotic heart disease of native coronary artery without angina pectoris: Secondary | ICD-10-CM

## 2023-10-28 DIAGNOSIS — I6523 Occlusion and stenosis of bilateral carotid arteries: Secondary | ICD-10-CM

## 2023-10-30 ENCOUNTER — Other Ambulatory Visit: Payer: Self-pay

## 2023-10-30 NOTE — Progress Notes (Deleted)
   10/30/2023 Name: Ian Ramos MRN: [id number] DOB: [birth year]  Chief Complaint  Patient presents with   Medication Assistance    {Visit HQIO:96295}   Subjective:  Care Team: Primary Care Provider: Mliss Sax, MD ; Next Scheduled Visit: *** {careteamprovider:27366}  Medication Access/Adherence  Current Pharmacy:  [id number] [place] Spice, [place] - [address] AT [address] [facility] [address] [place] [address] Phone: [phone] Fax: [phone]   Patient reports affordability concerns with their medications: {YES/NO:21197} Patient reports access/transportation concerns to their pharmacy: {YES/NO:21197} Patient reports adherence concerns with their medications:  {YES/NO:21197} ***   {Pharmacy S/O Choices:26420}   Objective:  Lab Results  Component Value Date   HGBA1C 8.5 (H) 08/08/2023    Lab Results  Component Value Date   CREATININE 1.83 (H) 08/08/2023   BUN 23 08/08/2023   NA 140 08/08/2023   K 4.3 08/08/2023   CL 108 08/08/2023   CO2 24 08/08/2023    Lab Results  Component Value Date   CHOL 117 08/10/2022   HDL 40 08/10/2022   LDLCALC 59 08/10/2022   LDLDIRECT 60 02/08/2023   TRIG 92 08/10/2022   CHOLHDL 2.9 08/10/2022    Medications Reviewed Today     Reviewed by Lenna Gilford, RPH (Pharmacist) on 10/30/23 at 1337  Med List Status: <None>   Medication Order Taking? Sig Documenting Provider Last Dose Status Informant  aspirin EC 81 MG tablet [id number] No Take 81 mg by mouth daily. [provider] Taking Active   atorvastatin (LIPITOR) 80 MG tablet [id number] No TAKE 1 TABLET(80 MG) BY MOUTH DAILY Mliss Sax, MD Taking Active   B Complex Vitamins (B COMPLEX-B12) TABS [id number] No Take one tablet daily Mliss Sax, MD Taking Active Spouse/Significant Other  clopidogrel (PLAVIX) 75 MG tablet [id number] No Take 1 tablet (75 mg total) by mouth daily. Mliss Sax, MD Taking Active   FEROSUL [phone] Fe) MG tablet [id number]  TAKE 1 TABLET BY MOUTH EVERY OTHER DAY Mliss Sax, MD  Active   gabapentin (NEURONTIN) 100 MG capsule [id number] No TAKE 2 CAPSULES(200 MG) BY MOUTH AT BEDTIME Mliss Sax, MD Taking Active   JARDIANCE 25 MG TABS tablet [id number] No TAKE 1 TABLET(25 MG) BY MOUTH DAILY BEFORE BREAKFAST Mliss Sax, MD Taking Active   metFORMIN (GLUCOPHAGE-XR) 500 MG 24 hr tablet [id number]  Take 1 tablet (500 mg total) by mouth 2 (two) times daily with a meal. Mliss Sax, MD  Active   metoprolol tartrate (LOPRESSOR) 25 MG tablet [id number]  Take 0.5 tablets (12.5 mg total) by mouth 2 (two) times daily. Mliss Sax, MD  Active   sildenafil (REVATIO) 20 MG tablet [id number] No TAKE 3-5 TABLETS BY MOUTH DAILY 45 MINUTES PRIOR  Patient not taking: Reported on 08/08/2023   Mliss Sax, MD Not Taking Active   venlafaxine XR (EFFEXOR-XR) 150 MG 24 hr capsule [id number] No TAKE 1 CAPSULE(150 MG) BY MOUTH DAILY WITH BREAKFAST Mliss Sax, MD Taking Active               Assessment/Plan:   {Pharmacy A/P Choices:26421}  Follow Up Plan: ***  ***

## 2023-10-30 NOTE — Progress Notes (Signed)
   10/30/2023  Patient ID: Ian Ramos, male   DOB: 09-16-1946, 78 y.o.   MRN: [id number]  Subjective/Objective Telephone visit to assist with affordability of Jardiance 25 mg in response to referral placed by patient's PCP, Dr. Doreene Burke.  Medication access/affordability -Patient with diagnoses including: Type 2 diabetes, CKD, and cardiomyopathy -Currently prescribed Jardiance 25 mg daily, but patient states his last refill of this medication was $450-this is not affordable moving forward -Patient has 2 tablets of Jardiance 25 mg remaining at home  Assessment/Plan  Medication access/affordability -Assessed for eligibility to enroll patient in health well foundation grant based on cardiomyopathy diagnosis -Based on Medicare coverage and patient's household income, he did qualify for this grant to cover his co-pay at the pharmacy -Contacted the pharmacy to provide processing information, and patient's Jardiance 25 mg is now going through for $0 co-pay (health will grant information can be found under patient's active FYI's on his profile)  Follow-up: Attempted to follow back up with patient to notify, but I was not able to reach him; so I left a HIPAA compliant voicemail with my direct phone number.  I will attempt to contact the patient again early next week if I do not hear back-also want to verify he has increased his metformin dosing based on last A1c and direction from Dr. Claudius Sis, PharmD, DPLA

## 2023-11-06 DIAGNOSIS — K080 Exfoliation of teeth due to systemic causes: Secondary | ICD-10-CM | POA: Diagnosis not present

## 2023-11-11 ENCOUNTER — Ambulatory Visit: Payer: Medicare Other | Admitting: Family Medicine

## 2023-11-11 ENCOUNTER — Encounter: Payer: Self-pay | Admitting: Family Medicine

## 2023-11-11 VITALS — BP 124/84 | HR 70 | Temp 98.2°F | Ht 66.0 in | Wt 188.6 lb

## 2023-11-11 DIAGNOSIS — N1832 Chronic kidney disease, stage 3b: Secondary | ICD-10-CM | POA: Diagnosis not present

## 2023-11-11 DIAGNOSIS — E1165 Type 2 diabetes mellitus with hyperglycemia: Secondary | ICD-10-CM | POA: Diagnosis not present

## 2023-11-11 DIAGNOSIS — Z7984 Long term (current) use of oral hypoglycemic drugs: Secondary | ICD-10-CM

## 2023-11-11 NOTE — Progress Notes (Signed)
 Established Patient Office Visit   Subjective:  Patient ID: Ian Ramos, male    DOB: 27-Mar-1946  Age: 78 y.o. MRN: [id number]  Chief Complaint  Patient presents with   Medical Management of Chronic Issues    3 month follow up.    HPI Encounter Diagnoses  Name Primary?   Stage 3b chronic kidney disease (HCC) Yes   Poorly controlled type 2 diabetes mellitus (HCC)    For follow-up of above.  Continues with Jardiance  25 mg and metformin  500 XL twice daily for diabetes.  Has been urinating every 3 hours with the Jardiance .   Review of Systems  Constitutional: Negative.   HENT: Negative.    Eyes:  Negative for blurred vision, discharge and redness.  Respiratory: Negative.    Cardiovascular: Negative.   Gastrointestinal:  Negative for abdominal pain.  Genitourinary: Negative.   Musculoskeletal: Negative.  Negative for myalgias.  Skin:  Negative for rash.  Neurological:  Negative for tingling, loss of consciousness and weakness.  Endo/Heme/Allergies:  Negative for polydipsia.     Current Outpatient Medications:    aspirin  EC 81 MG tablet, Take 81 mg by mouth daily., Disp: , Rfl:    atorvastatin  (LIPITOR ) 80 MG tablet, TAKE 1 TABLET(80 MG) BY MOUTH DAILY, Disp: 90 tablet, Rfl: 2   B Complex Vitamins (B COMPLEX-B12) TABS, Take one tablet daily, Disp: 90 tablet, Rfl: 1   clopidogrel  (PLAVIX ) 75 MG tablet, TAKE 1 TABLET(75 MG) BY MOUTH DAILY, Disp: 90 tablet, Rfl: 1   FEROSUL 325 (65 Fe) MG tablet, TAKE 1 TABLET BY MOUTH EVERY OTHER DAY, Disp: 45 tablet, Rfl: 2   gabapentin  (NEURONTIN ) 100 MG capsule, TAKE 2 CAPSULES(200 MG) BY MOUTH AT BEDTIME, Disp: 180 capsule, Rfl: 1   JARDIANCE  25 MG TABS tablet, TAKE 1 TABLET(25 MG) BY MOUTH DAILY BEFORE BREAKFAST, Disp: 30 tablet, Rfl: 5   metFORMIN  (GLUCOPHAGE -XR) 500 MG 24 hr tablet, Take 1 tablet (500 mg total) by mouth 2 (two) times daily with a meal., Disp: 180 tablet, Rfl: 1   metoprolol  tartrate (LOPRESSOR ) 25 MG tablet, Take  0.5 tablets (12.5 mg total) by mouth 2 (two) times daily., Disp: 90 tablet, Rfl: 3   sildenafil  (REVATIO ) 20 MG tablet, TAKE 3-5 TABLETS BY MOUTH DAILY 45 MINUTES PRIOR, Disp: 25 tablet, Rfl: 2   venlafaxine  XR (EFFEXOR -XR) 150 MG 24 hr capsule, TAKE 1 CAPSULE(150 MG) BY MOUTH DAILY WITH BREAKFAST, Disp: 90 capsule, Rfl: 0   Objective:     BP 124/84   Pulse 70   Temp 98.2 F (36.8 C)   Ht 5\' 6"  (1.676 m)   Wt 188 lb 9.6 oz (85.5 kg)   SpO2 97%   BMI 30.44 kg/m    Physical Exam Constitutional:      General: He is not in acute distress.    Appearance: Normal appearance. He is not ill-appearing, toxic-appearing or diaphoretic.  HENT:     Head: Normocephalic and atraumatic.     Right Ear: External ear normal.     Left Ear: External ear normal.  Eyes:     General: No scleral icterus.       Right eye: No discharge.        Left eye: No discharge.     Extraocular Movements: Extraocular movements intact.     Conjunctiva/sclera: Conjunctivae normal.  Pulmonary:     Effort: Pulmonary effort is normal. No respiratory distress.  Skin:    General: Skin is warm and dry.  Neurological:  Mental Status: He is alert and oriented to person, place, and time.  Psychiatric:        Mood and Affect: Mood normal.        Behavior: Behavior normal.      No results found for any visits on 11/11/23.    The ASCVD Risk score (Arnett DK, et al., 2019) failed to calculate for the following reasons:   Risk score cannot be calculated because patient has a medical history suggesting prior/existing ASCVD    Assessment & Plan:   Stage 3b chronic kidney disease (HCC) -     Basic metabolic panel -     AMB Referral [facility]BCI Care Management -     Basic metabolic panel  Poorly controlled type 2 diabetes mellitus (HCC) -     Hemoglobin A1c -     Basic metabolic panel -     AMB Referral [facility]BCI Care Management -     Basic metabolic panel -     Hemoglobin A1c    Return in about 3 months (around  02/08/2024).  Pharmacy consultation for assistance affording Jardiance .  Again discussed moderation of simple carbohydrates.  Advised fluid restricting a few hours before bedtime.  Discussed increasing metformin  if needed.  Tonna Frederic, MD

## 2023-11-12 LAB — BASIC METABOLIC PANEL
BUN: 27 mg/dL — ABNORMAL HIGH (ref 6–23)
CO2: 21 meq/L (ref 19–32)
Calcium: 8.9 mg/dL (ref 8.4–10.5)
Chloride: 106 meq/L (ref 96–112)
Creatinine, Ser: 1.83 mg/dL — ABNORMAL HIGH (ref 0.40–1.50)
GFR: 34.72 mL/min — ABNORMAL LOW (ref >60.00)
Glucose, Bld: 137 mg/dL — ABNORMAL HIGH (ref 70–99)
Potassium: 4.1 meq/L (ref 3.5–5.1)
Sodium: 137 meq/L (ref 135–145)

## 2023-11-12 LAB — HEMOGLOBIN A1C: Hgb A1c MFr Bld: 8.2 % — ABNORMAL HIGH (ref 4.6–6.5)

## 2023-11-12 NOTE — Addendum Note (Signed)
Addended by: Andrez Grime on: 11/12/2023 12:15 PM   Modules accepted: Orders

## 2023-11-20 ENCOUNTER — Telehealth: Payer: Self-pay

## 2023-11-20 NOTE — Progress Notes (Signed)
   11/20/2023  Patient ID: Ian Ramos, male   DOB: [birth year], 78 y.o.   MRN: [id number]  Referral placed to 07/20/2024 to assist with affordability of Jardiance.  I spoke with the patient previously and was able to get him enrolled in a health well foundation grant that fully covers his co-pay for this medication.  Contacted his [employer], and he picked up a 30-day supply at no cost on 2/3-patient can refill prescription again 2/17.  I attempted to contact the patient to check-in and verify he has his Jardiance 25 mg, but I was not able to reach him.  HIPAA compliant voicemail was left with my direct phone number.  Lenna Gilford, PharmD, DPLA

## 2023-11-27 ENCOUNTER — Telehealth: Payer: Self-pay

## 2023-11-27 NOTE — Telephone Encounter (Signed)
 Copied from CRM [id number]. Topic: Clinical - Medication Question >> Nov 27, 2023 10:59 AM Almira Coaster wrote: Reason for CRM: Patient is calling due to JARDIANCE 25 MG TABS tablet prescription, insurance informed patient that he would be responsible for this medication and the cost is $420. He would like to speak with Elnita Maxwell to see if there is anything they can do. Best call back number [phone]

## 2023-11-28 ENCOUNTER — Telehealth: Payer: Self-pay

## 2023-11-28 NOTE — Progress Notes (Signed)
   11/28/2023  Patient ID: Ian Ramos, male   DOB: Dec 23, 1945, 78 y.o.   MRN: [id number]  In basket message from patient's PCP office stating he has still not been able to get his Jardiance 25 mg from [facility].  Contacted the pharmacy, and they were able to process the refill to day on his insurance and healthwell grant; this is going through for $0 co-pay.  Attempted to contact the patient to let him know, but I was not able to reach him.  HIPAA compliant voicemail was left with my direct phone number for him to return my call.  Would also like to schedule a telephone follow-up visit based on recent A1c of 8.2%, with no additional diabetes pharmacotherapy on board.  I will try to contact the patient again next week if I do not hear back.  Lenna Gilford, PharmD, DPLA

## 2023-12-05 ENCOUNTER — Telehealth: Payer: Self-pay

## 2023-12-05 NOTE — Progress Notes (Signed)
   12/05/2023  Patient ID: Ian Ramos, male   DOB: April 14, 1946, 78 y.o.   MRN: [id number]  Out reach attempt to verify patient was able to pick up Jardiance.  I was not able to reach the patient, but a HIPAA compliant voicemail with my direct phone number was left.  If I do not hear back from the patient, I will attempt to call again next week to verify Jardiance was picked up and discuss control of diabetes based on recently elevated A1c reading.  Lenna Gilford, PharmD, DPLA

## 2023-12-11 ENCOUNTER — Other Ambulatory Visit: Payer: Self-pay

## 2023-12-11 NOTE — Progress Notes (Unsigned)
   12/11/2023  Patient ID: Ian Ramos, male   DOB: 07-31-1946, 78 y.o.   MRN: [id number]  Outreach attempt to follow-up on access/affordability of Jardiance and control of  diabetes unsuccessful, but I was able to leave HIPAA compliant voicemail with my direct phone number.    Lenna Gilford, PharmD, DPLA

## 2023-12-25 ENCOUNTER — Other Ambulatory Visit: Payer: Self-pay

## 2023-12-25 ENCOUNTER — Other Ambulatory Visit: Payer: Self-pay | Admitting: Family Medicine

## 2023-12-25 DIAGNOSIS — I10 Essential (primary) hypertension: Secondary | ICD-10-CM

## 2023-12-25 DIAGNOSIS — I2510 Atherosclerotic heart disease of native coronary artery without angina pectoris: Secondary | ICD-10-CM

## 2023-12-25 MED ORDER — ATORVASTATIN CALCIUM 80 MG PO TABS: 80.0000 mg | ORAL_TABLET | Freq: Every day | ORAL | 1 refills | Status: DC

## 2024-01-05 ENCOUNTER — Other Ambulatory Visit: Payer: Self-pay | Admitting: Family Medicine

## 2024-01-05 DIAGNOSIS — I10 Essential (primary) hypertension: Secondary | ICD-10-CM

## 2024-01-05 DIAGNOSIS — I2510 Atherosclerotic heart disease of native coronary artery without angina pectoris: Secondary | ICD-10-CM

## 2024-01-27 ENCOUNTER — Other Ambulatory Visit: Payer: Self-pay | Admitting: Family Medicine

## 2024-01-27 DIAGNOSIS — M542 Cervicalgia: Secondary | ICD-10-CM

## 2024-01-27 DIAGNOSIS — F321 Major depressive disorder, single episode, moderate: Secondary | ICD-10-CM

## 2024-01-27 DIAGNOSIS — R5382 Chronic fatigue, unspecified: Secondary | ICD-10-CM

## 2024-02-01 ENCOUNTER — Other Ambulatory Visit: Payer: Self-pay | Admitting: Family Medicine

## 2024-02-01 DIAGNOSIS — N1832 Chronic kidney disease, stage 3b: Secondary | ICD-10-CM

## 2024-02-01 DIAGNOSIS — R7303 Prediabetes: Secondary | ICD-10-CM

## 2024-02-06 ENCOUNTER — Encounter: Payer: Self-pay | Admitting: Family Medicine

## 2024-02-06 ENCOUNTER — Ambulatory Visit (INDEPENDENT_AMBULATORY_CARE_PROVIDER_SITE_OTHER): Payer: Medicare Other | Admitting: Family Medicine

## 2024-02-06 VITALS — BP 106/70 | HR 67 | Temp 98.0°F | Ht 66.0 in | Wt 183.4 lb

## 2024-02-06 DIAGNOSIS — E559 Vitamin D deficiency, unspecified: Secondary | ICD-10-CM | POA: Diagnosis not present

## 2024-02-06 DIAGNOSIS — G959 Disease of spinal cord, unspecified: Secondary | ICD-10-CM

## 2024-02-06 DIAGNOSIS — E1122 Type 2 diabetes mellitus with diabetic chronic kidney disease: Secondary | ICD-10-CM

## 2024-02-06 DIAGNOSIS — Z7984 Long term (current) use of oral hypoglycemic drugs: Secondary | ICD-10-CM

## 2024-02-06 DIAGNOSIS — N1832 Chronic kidney disease, stage 3b: Secondary | ICD-10-CM

## 2024-02-06 DIAGNOSIS — E7800 Pure hypercholesterolemia, unspecified: Secondary | ICD-10-CM | POA: Diagnosis not present

## 2024-02-06 DIAGNOSIS — E1165 Type 2 diabetes mellitus with hyperglycemia: Secondary | ICD-10-CM

## 2024-02-06 DIAGNOSIS — E611 Iron deficiency: Secondary | ICD-10-CM

## 2024-02-06 MED ORDER — METFORMIN HCL ER 500 MG PO TB24: 500.0000 mg | ORAL_TABLET | Freq: Two times a day (BID) | ORAL | 2 refills | Status: DC

## 2024-02-06 NOTE — Progress Notes (Signed)
 Established Patient Office Visit   Subjective:  Patient ID: Ian Ramos, male    DOB: Sep 26, 1946  Age: 78 y.o. MRN: [id number]  Chief Complaint  Patient presents with   Medical Management of Chronic Issues    3 month follow up. Pt states he had a fall 1 month ago. Pt is fasting.    Fall    Pt had a fall 1 month ago. Pt complains of left side pain near rib and numbness left arm.     Fall Associated symptoms include tingling. Pertinent negatives include no abdominal pain or loss of consciousness.   Encounter Diagnoses  Name Primary?   Poorly controlled type 2 diabetes mellitus (HCC) Yes   Stage 3b chronic kidney disease (HCC)    Type 2 diabetes mellitus with stage 3b chronic kidney disease, without long-term current use of insulin  (HCC)    Elevated LDL cholesterol level    Vitamin D  deficiency    Iron deficiency    Cervical myelopathy (HCC)    For follow-up of above.  Has been taking metformin  500 mg once daily.  Continues with Jardiance  25 mg.  Last GFR was 34.  A1c was 8.5.  About a month ago was at home trying to install a phone jack near the floor.  He fell over and landed on his left side and arm.  He is mostly recovered but tells of lingering paresthesias in his left upper arm.  There has been no pain in his neck.  Ongoing weakness in left arm and history of cervical myelopathy.  There is no new weakness in his left arm.  He continues with range of motion exercises when lying down in bed at night.   Review of Systems  Constitutional: Negative.   HENT: Negative.    Eyes:  Negative for blurred vision, discharge and redness.  Respiratory: Negative.    Cardiovascular: Negative.   Gastrointestinal:  Negative for abdominal pain.  Genitourinary: Negative.   Musculoskeletal: Negative.  Negative for myalgias.  Skin:  Negative for rash.  Neurological:  Positive for tingling. Negative for loss of consciousness and weakness.  Endo/Heme/Allergies:  Negative for polydipsia.      Current Outpatient Medications:    aspirin  EC 81 MG tablet, Take 81 mg by mouth daily., Disp: , Rfl:    atorvastatin  (LIPITOR ) 80 MG tablet, Take 1 tablet (80 mg total) by mouth daily., Disp: 90 tablet, Rfl: 1   B Complex Vitamins (B COMPLEX-B12) TABS, Take one tablet daily, Disp: 90 tablet, Rfl: 1   clopidogrel  (PLAVIX ) 75 MG tablet, TAKE 1 TABLET(75 MG) BY MOUTH DAILY, Disp: 90 tablet, Rfl: 1   FEROSUL 325 (65 Fe) MG tablet, TAKE 1 TABLET BY MOUTH EVERY OTHER DAY, Disp: 45 tablet, Rfl: 2   gabapentin  (NEURONTIN ) 100 MG capsule, TAKE 2 CAPSULES(200 MG) BY MOUTH AT BEDTIME, Disp: 180 capsule, Rfl: 1   JARDIANCE  25 MG TABS tablet, TAKE 1 TABLET(25 MG) BY MOUTH DAILY BEFORE BREAKFAST, Disp: 30 tablet, Rfl: 5   metFORMIN  (GLUCOPHAGE -XR) 500 MG 24 hr tablet, Take 1 tablet (500 mg total) by mouth 2 (two) times daily with a meal., Disp: 180 tablet, Rfl: 2   metoprolol  tartrate (LOPRESSOR ) 25 MG tablet, Take 0.5 tablets (12.5 mg total) by mouth 2 (two) times daily., Disp: 90 tablet, Rfl: 3   sildenafil  (REVATIO ) 20 MG tablet, TAKE 3-5 TABLETS BY MOUTH DAILY 45 MINUTES PRIOR, Disp: 25 tablet, Rfl: 2   venlafaxine  XR (EFFEXOR -XR) 150 MG 24 hr capsule, TAKE  1 CAPSULE(150 MG) BY MOUTH DAILY WITH BREAKFAST, Disp: 90 capsule, Rfl: 0   Objective:     BP 106/70 (Cuff Size: Normal)   Pulse 67   Temp 98 F (36.7 C) (Temporal)   Ht 5\' 6"  (1.676 m)   Wt 183 lb 6.4 oz (83.2 kg)   SpO2 98%   BMI 29.60 kg/m    Physical Exam Constitutional:      General: He is not in acute distress.    Appearance: Normal appearance. He is not ill-appearing, toxic-appearing or diaphoretic.  HENT:     Head: Normocephalic and atraumatic.     Right Ear: External ear normal.     Left Ear: External ear normal.  Eyes:     General: No scleral icterus.       Right eye: No discharge.        Left eye: No discharge.     Extraocular Movements: Extraocular movements intact.     Conjunctiva/sclera: Conjunctivae normal.   Cardiovascular:     Rate and Rhythm: Normal rate and regular rhythm.  Pulmonary:     Effort: Pulmonary effort is normal. No respiratory distress.     Breath sounds: No wheezing, rhonchi or rales.  Musculoskeletal:     Cervical back: No bony tenderness. No pain with movement. Decreased range of motion.  Skin:    General: Skin is warm and dry.  Neurological:     Mental Status: He is alert and oriented to person, place, and time.     Comments: Unable to abduct left arm greater than 30 degrees.  This is not a new finding.  Psychiatric:        Mood and Affect: Mood normal.        Behavior: Behavior normal.      No results found for any visits on 02/06/24.    The ASCVD Risk score (Arnett DK, et al., 2019) failed to calculate for the following reasons:   Risk score cannot be calculated because patient has a medical history suggesting prior/existing ASCVD    Assessment & Plan:   Poorly controlled type 2 diabetes mellitus (HCC) -     Comprehensive metabolic panel with GFR -     metFORMIN  HCl ER; Take 1 tablet (500 mg total) by mouth 2 (two) times daily with a meal.  Dispense: 180 tablet; Refill: 2  Stage 3b chronic kidney disease (HCC) -     Comprehensive metabolic panel with GFR -     Hemoglobin A1c  Type 2 diabetes mellitus with stage 3b chronic kidney disease, without long-term current use of insulin  (HCC) -     Comprehensive metabolic panel with GFR -     Hemoglobin A1c  Elevated LDL cholesterol level -     Comprehensive metabolic panel with GFR -     LDL cholesterol, direct  Vitamin D  deficiency -     VITAMIN D  25 Hydroxy (Vit-D Deficiency, Fractures)  Iron deficiency -     CBC -     Iron, TIBC and Ferritin Panel  Cervical myelopathy (HCC) -     MR CERVICAL SPINE WO CONTRAST; Future    Return in about 3 months (around 05/08/2024), or Continue Jardiance  and metformin ..  Pending results of A1c and GFR may need to change diabetes regimen.  MRI of cervical spine for  workup of paresthesias in the left upper arm.  Tonna Frederic, MD

## 2024-02-07 LAB — IRON,TIBC AND FERRITIN PANEL
%SAT: 24 % (ref 20–48)
Ferritin: 91 ng/mL (ref 24–380)
Iron: 73 ug/dL (ref 50–180)
TIBC: 301 ug/dL (ref 250–425)

## 2024-02-07 LAB — VITAMIN D 25 HYDROXY (VIT D DEFICIENCY, FRACTURES): VITD: 62.54 ng/mL (ref 30.00–100.00)

## 2024-02-07 LAB — COMPREHENSIVE METABOLIC PANEL WITH GFR
ALT: 27 U/L (ref 0–53)
AST: 22 U/L (ref 0–37)
Albumin: 4.3 g/dL (ref 3.5–5.2)
Alkaline Phosphatase: 113 U/L (ref 39–117)
BUN: 27 mg/dL — ABNORMAL HIGH (ref 6–23)
CO2: 25 meq/L (ref 19–32)
Calcium: 9.1 mg/dL (ref 8.4–10.5)
Chloride: 105 meq/L (ref 96–112)
Creatinine, Ser: 1.9 mg/dL — ABNORMAL HIGH (ref 0.40–1.50)
GFR: 33.14 mL/min — ABNORMAL LOW (ref >60.00)
Glucose, Bld: 82 mg/dL (ref 70–99)
Potassium: 5 meq/L (ref 3.5–5.1)
Sodium: 142 meq/L (ref 135–145)
Total Bilirubin: 0.6 mg/dL (ref 0.2–1.2)
Total Protein: 6.9 g/dL (ref 6.0–8.3)

## 2024-02-07 LAB — CBC
HCT: 46.1 % (ref 39.0–52.0)
Hemoglobin: 14.8 g/dL (ref 13.0–17.0)
MCHC: 32.2 g/dL (ref 30.0–36.0)
MCV: 93.1 fl (ref 78.0–100.0)
Platelets: 177 10*3/uL (ref 150.0–400.0)
RBC: 4.96 Mil/uL (ref 4.22–5.81)
RDW: 14.7 % (ref 11.5–15.5)
WBC: 8.8 10*3/uL (ref 4.0–10.5)

## 2024-02-07 LAB — LDL CHOLESTEROL, DIRECT: Direct LDL: 54 mg/dL

## 2024-02-07 LAB — HEMOGLOBIN A1C: Hgb A1c MFr Bld: 8.4 % — ABNORMAL HIGH (ref 4.6–6.5)

## 2024-02-11 ENCOUNTER — Telehealth: Payer: Self-pay | Admitting: Family Medicine

## 2024-02-11 NOTE — Telephone Encounter (Signed)
 Pts girlfriend would like a call to see how he is doing. She is on the [employer] [phone].

## 2024-02-12 NOTE — Telephone Encounter (Unsigned)
 Copied from CRM [id number]. Topic: Clinical - Medical Advice >> Feb 11, 2024  2:19 PM Deaijah H wrote: Reason for CRM: Lois Rink called to try and speak with someone to check on partner. Advised by CAL they will call , notified patient. >> Feb 12, 2024  8:11 AM Howard Macho wrote: Tanya Fantasia [facility]) called stating she sent a previous message regarding the patient. Tanya Fantasia stated she is in [place]  and she does not get all the information regarding the patient health and blood work. Tanya Fantasia stated she would like someone to call her so she can find out  CB [phone]

## 2024-02-13 DIAGNOSIS — K080 Exfoliation of teeth due to systemic causes: Secondary | ICD-10-CM | POA: Diagnosis not present

## 2024-02-13 NOTE — Telephone Encounter (Signed)
 Copied from CRM [id number]. Topic: Clinical - Medical Advice >> Feb 13, 2024 12:53 PM Baldo Levan wrote: Reason for CRM: Ian Ramos (patients POA) called in requesting a call back from a nurse regarding an update with Ian Ramos. Ian Ramos stated she has been calling for 3 days with no response or call back from anyone. Bonnie's number is [phone]

## 2024-02-17 NOTE — Telephone Encounter (Signed)
 Copied from CRM [id number]. Topic: Clinical - Lab/Test Results >> Feb 17, 2024  9:02 AM Jenna Moan wrote: Reason for Crm: Patient Friend would like to talk Dr. Tilmon Font Nurse about his lab results  at [phone]

## 2024-02-23 ENCOUNTER — Other Ambulatory Visit: Payer: Self-pay | Admitting: Family Medicine

## 2024-02-23 DIAGNOSIS — I2510 Atherosclerotic heart disease of native coronary artery without angina pectoris: Secondary | ICD-10-CM

## 2024-02-23 DIAGNOSIS — I6523 Occlusion and stenosis of bilateral carotid arteries: Secondary | ICD-10-CM

## 2024-03-09 ENCOUNTER — Other Ambulatory Visit: Payer: Self-pay | Admitting: Family Medicine

## 2024-03-09 DIAGNOSIS — I2510 Atherosclerotic heart disease of native coronary artery without angina pectoris: Secondary | ICD-10-CM

## 2024-03-12 ENCOUNTER — Encounter: Payer: Self-pay | Admitting: Family Medicine

## 2024-03-17 ENCOUNTER — Encounter: Payer: Self-pay | Admitting: Family Medicine

## 2024-03-23 ENCOUNTER — Encounter: Payer: Self-pay | Admitting: Family Medicine

## 2024-03-25 ENCOUNTER — Ambulatory Visit
Admission: RE | Admit: 2024-03-25 | Discharge: 2024-03-25 | Disposition: A | Discharge status: Home / Self Care | Source: Ambulatory Visit | Attending: Family Medicine | Admitting: Family Medicine

## 2024-03-25 DIAGNOSIS — M5001 Cervical disc disorder with myelopathy,  high cervical region: Secondary | ICD-10-CM | POA: Diagnosis not present

## 2024-03-25 DIAGNOSIS — M50021 Cervical disc disorder at C4-C5 level with myelopathy: Secondary | ICD-10-CM | POA: Diagnosis not present

## 2024-03-25 DIAGNOSIS — G959 Disease of spinal cord, unspecified: Secondary | ICD-10-CM

## 2024-04-01 ENCOUNTER — Other Ambulatory Visit: Payer: Self-pay | Admitting: Family Medicine

## 2024-04-01 DIAGNOSIS — R7303 Prediabetes: Secondary | ICD-10-CM

## 2024-04-01 DIAGNOSIS — N1832 Chronic kidney disease, stage 3b: Secondary | ICD-10-CM

## 2024-04-01 NOTE — Telephone Encounter (Unsigned)
 Copied from CRM [id number]. Topic: Clinical - Medication Refill >> Apr 01, 2024  5:08 PM Gennette ORN wrote: Medication: JARDIANCE  25 MG TABS tablet  Has the patient contacted their pharmacy? No (Agent: If no, request that the patient contact the pharmacy for the refill. If patient does not wish to contact the pharmacy document the reason why and proceed with request.) (Agent: If yes, when and what did the pharmacy advise?)  This is the patient's preferred pharmacy:  [id number] - [place], [place] - [address] [facility] [address] [place] [address] Phone: [phone] Fax: [phone]  Is this the correct pharmacy for this prescription? Yes If no, delete pharmacy and type the correct one.   Has the prescription been filled recently? Yes  Is the patient out of the medication? No  Has the patient been seen for an appointment in the last year OR does the patient have an upcoming appointment? Yes  Can we respond through MyChart? No  Agent: Please be advised that Rx refills may take up to 3 business days. We ask that you follow-up with your pharmacy.

## 2024-04-05 ENCOUNTER — Ambulatory Visit: Payer: Self-pay | Admitting: Family Medicine

## 2024-04-06 NOTE — Telephone Encounter (Signed)
 Copied from CRM [id number]. Topic: Clinical - Lab/Test Results >> Apr 06, 2024  1:04 PM Shereese L wrote: Reason for CRM: patient called in for lab results and wants a call back @ [phone]

## 2024-04-07 ENCOUNTER — Other Ambulatory Visit: Payer: Self-pay | Admitting: Family Medicine

## 2024-04-07 NOTE — Addendum Note (Signed)
 Addended by: BERNETA ELSIE LABOR on: 04/07/2024 08:48 AM   Modules accepted: Orders

## 2024-04-24 ENCOUNTER — Ambulatory Visit

## 2024-04-24 DIAGNOSIS — Z0000 Encounter for general adult medical examination without abnormal findings: Secondary | ICD-10-CM | POA: Diagnosis not present

## 2024-04-24 NOTE — Progress Notes (Signed)
 Subjective:   Ian Ramos is a 78 y.o. who presents for a Medicare Wellness preventive visit.  As a reminder, Annual Wellness Visits don't include a physical exam, and some assessments may be limited, especially if this visit is performed virtually. We may recommend an in-person follow-up visit with your provider if needed.  Visit Complete: Virtual I connected with  Clemon T Martelle on 04/24/24 by a audio enabled telemedicine application and verified that I am speaking with the correct person using two identifiers.  Patient Location: Home  Provider Location: Office/Clinic  I discussed the limitations of evaluation and management by telemedicine. The patient expressed understanding and agreed to proceed.  Vital Signs: Because this visit was a virtual/telehealth visit, some criteria may be missing or patient reported. Any vitals not documented were not able to be obtained and vitals that have been documented are patient reported.  VideoError- Librarian, academic were attempted between this provider and patient, however failed, due to patient having technical difficulties OR patient did not have access to video capability.  We continued and completed visit with audio only.   Persons Participating in Visit: Patient.  AWV Questionnaire: No: Patient Medicare AWV questionnaire was not completed prior to this visit.  Cardiac Risk Factors include: advanced age (>48men, >61 women);diabetes mellitus     Objective:    Today's Vitals   There is no height or weight on file to calculate BMI.     04/24/2024    3:42 PM 11/19/2022    1:48 PM 05/23/2021   12:22 PM 06/09/2019    9:43 PM 06/28/2018    5:00 PM 06/19/2018    6:28 PM 05/29/2018    2:00 AM  Advanced Directives  Does Patient Have a Medical Advance Directive? Yes Yes Yes No Yes  Yes  Yes   Type of Advance Directive Living will Healthcare Power of [place];Living will Healthcare Power of [employer] Power of [employer] Power of [employer] Power of Attorney  Does patient want to make changes to medical advance directive?     No - Patient declined  No - Patient declined  No - Patient declined   Copy of Healthcare Power of Attorney in Chart?  Yes - validated most recent copy scanned in chart (See row information) Yes - validated most recent copy scanned in chart (See row information)  Yes  Yes  No - copy requested   Would patient like information on creating a medical advance directive?    No - Guardian declined        Data saved with a previous flowsheet row definition    Current Medications (verified) Outpatient Encounter Medications as of 04/24/2024  Medication Sig   aspirin  EC 81 MG tablet Take 81 mg by mouth daily.   atorvastatin  (LIPITOR ) 80 MG tablet Take 1 tablet (80 mg total) by mouth daily.   B Complex Vitamins (B COMPLEX-B12) TABS Take one tablet daily   clopidogrel  (PLAVIX ) 75 MG tablet TAKE 1 TABLET(75 MG) BY MOUTH DAILY   FEROSUL 325 (65 Fe) MG tablet TAKE 1 TABLET BY MOUTH EVERY OTHER DAY   gabapentin  (NEURONTIN ) 100 MG capsule TAKE 2 CAPSULES(200 MG) BY MOUTH AT BEDTIME   JARDIANCE  25 MG TABS tablet TAKE 1 TABLET(25 MG) BY MOUTH DAILY BEFORE BREAKFAST   metFORMIN  (GLUCOPHAGE -XR) 500 MG 24 hr tablet Take 1 tablet (500 mg total) by mouth 2 (two) times daily with a meal.   metoprolol  tartrate (LOPRESSOR ) 25 MG tablet  TAKE 1 TABLET(25 MG) BY MOUTH TWICE DAILY   sildenafil  (REVATIO ) 20 MG tablet TAKE 3-5 TABLETS BY MOUTH DAILY 45 MINUTES PRIOR   venlafaxine  XR (EFFEXOR -XR) 150 MG 24 hr capsule TAKE 1 CAPSULE(150 MG) BY MOUTH DAILY WITH BREAKFAST   No facility-administered encounter medications on file as of 04/24/2024.    Allergies (verified) Cymbalta  [duloxetine  hcl] and Semaglutide    History: Past Medical History:  Diagnosis Date   Arthritis    CAD in native artery    a. cath 06/2018 - Eccentric 75% proximal LAD; 75% OM2; occluded RV with  collaterals   CKD (chronic kidney disease), stage III (HCC)    MI (myocardial infarction) (HCC)    Neuromuscular disorder (HCC)    Stroke [facility])    Past Surgical History:  Procedure Laterality Date   CAROTID PTA/STENT INTERVENTION N/A 06/27/2018   Procedure: CAROTID PTA/STENT INTERVENTION;  Surgeon: Harvey Carlin BRAVO, MD;  Location: [facility] INVASIVE CV LAB;  Service: Cardiovascular;  Laterality: N/A;   LEFT HEART CATH AND CORONARY ANGIOGRAPHY N/A 06/05/2018   Procedure: LEFT HEART CATH AND CORONARY ANGIOGRAPHY;  Surgeon: Swaz[place], Peter M, MD;  Location: [facility] INVASIVE CV LAB;  Service: Cardiovascular;  Laterality: N/A;   POSTERIOR CERVICAL FUSION/FORAMINOTOMY N/A 05/23/2018   Procedure: Posterior Cervical Fusion with lateral mass fixation - C1 - C6 with laminectomy;  Surgeon: Louis Shove, MD;  Location: [facility] OR;  Service: Neurosurgery;  Laterality: N/A;   Family History  Problem Relation Age of Onset   Hypertension Other    Heart failure Mother    CAD Father        s/p CABG in his 16s   Heart failure Father    Social History   Socioeconomic History   Marital status: Divorced    Spouse name: Not on file   Number of children: Not on file   Years of education: Not on file   Highest education level: Not on file  Occupational History   Not on file  Tobacco Use   Smoking status: Never   Smokeless tobacco: Current    Types: Chew  Vaping Use   Vaping status: Never Used  Substance and Sexual Activity   Alcohol use: No   Drug use: No   Sexual activity: Not on file  Other Topics Concern   Not on file  Social History Narrative   NA   Social Drivers of Health   Financial Resource Strain: Low Risk  (04/24/2024)   Overall Financial Resource Strain (CARDIA)    Difficulty of Paying Living Expenses: Not hard at all  Food Insecurity: No Food Insecurity (04/24/2024)   Hunger Vital Sign    Worried About Running Out of Food in the Last Year: Never true    Ran Out of Food in the Last Year: Never true   Transportation Needs: No Transportation Needs (04/24/2024)   PRAPARE - Administrator, Civil Service (Medical): No    Lack of Transportation (Non-Medical): No  Physical Activity: Inactive (04/24/2024)   Exercise Vital Sign    Days of Exercise per Week: 0 days    Minutes of Exercise per Session: 0 min  Stress: No Stress Concern Present (04/24/2024)   [employer] of Occupational Health - Occupational Stress Questionnaire    Feeling of Stress: Not at all  Social Connections: Socially Isolated (04/24/2024)   Social Connection and Isolation Panel    Frequency of Communication with Friends and Family: Never    Frequency of Social Gatherings with Friends and Family:  Never    Attends Religious Services: Never    Active Member of Clubs or Organizations: No    Attends Banker Meetings: Never    Marital Status: Divorced    Tobacco Counseling Ready to quit: Not Answered Counseling given: Not Answered    Clinical Intake:  Pre-visit preparation completed: Yes  Pain : No/denies pain     Nutritional Risks: None Diabetes: Yes CBG done?: No Did pt. bring in CBG monitor from home?: No  Lab Results  Component Value Date   HGBA1C 8.4 (H) 02/06/2024   HGBA1C 8.2 (H) 11/11/2023   HGBA1C 8.5 (H) 08/08/2023     How often do you need to have someone help you when you read instructions, pamphlets, or other written materials from your doctor or pharmacy?: 1 - Never  Interpreter Needed?: No  Information entered by :: NAllen LPN   Activities of Daily Living     04/24/2024    3:37 PM  In your present state of health, do you have any difficulty performing the following activities:  Hearing? 0  Vision? 0  Difficulty concentrating or making decisions? 0  Walking or climbing stairs? 0  Dressing or bathing? 0  Doing errands, shopping? 0  Preparing Food and eating ? N  Using the Toilet? N  In the past six months, have you accidently leaked urine? N  Do you  have problems with loss of bowel control? N  Managing your Medications? N  Managing your Finances? N  Housekeeping or managing your Housekeeping? N    Patient Care Team: Berneta Elsie Sayre, MD as PCP - General (Family Medicine) Shlomo Wilbert SAUNDERS, MD as PCP - Cardiology (Cardiology)  I have updated your Care Teams any recent Medical Services you may have received from other providers in the past year.     Assessment:   This is a routine wellness examination for Leith.  Hearing/Vision screen Hearing Screening - Comments:: Denies hearing issues Vision Screening - Comments:: No regular eye exams   Goals Addressed             This Visit's Progress    Patient Stated       04/24/2024, wants to get stronger       Depression Screen     04/24/2024    3:44 PM 02/08/2023    2:12 PM 01/23/2023   10:22 AM 11/19/2022    1:49 PM 08/10/2022    2:42 PM 05/11/2022    2:10 PM 03/30/2022    3:54 PM  PHQ 2/9 Scores  PHQ - 2 Score 0 0 2 0 0 0 4  PHQ- 9 Score   4    6    Fall Risk     04/24/2024    3:43 PM 02/08/2023    2:12 PM 01/23/2023   10:22 AM 11/19/2022    1:49 PM 08/10/2022    2:42 PM  Fall Risk   Falls in the past year? 0 0 0 0 0  Number falls in past yr: 0 0 0 0 0  Injury with Fall? 0 0 0 0   Risk for fall due to : Medication side effect No Fall Risks No Fall Risks Medication side effect   Follow up Falls evaluation completed;Falls prevention discussed Falls evaluation completed  Falls prevention discussed;Education provided;Falls evaluation completed     MEDICARE RISK AT HOME:  Medicare Risk at Home Any stairs in or around the home?: Yes If so, are there any without handrails?: Yes Home  free of loose throw rugs in walkways, pet beds, electrical cords, etc?: Yes Adequate lighting in your home to reduce risk of falls?: Yes Life alert?: No Use of a cane, walker or w/c?: No Grab bars in the bathroom?: No Shower chair or bench in shower?: No Elevated toilet seat or a  handicapped toilet?: No  TIMED UP AND GO:  Was the test performed?  No  Cognitive Function: 6CIT completed        04/24/2024    3:44 PM 11/19/2022    1:49 PM  6CIT Screen  What Year? 0 points 0 points  What month? 0 points 0 points  What time? 0 points 0 points  Count back from 20 0 points 0 points  Months in reverse 0 points 2 points  Repeat phrase 0 points 6 points  Total Score 0 points 8 points    Immunizations Immunization History  Administered Date(s) Administered   Fluad Quad(high Dose 65+) 06/26/2019, 07/07/2020   Influenza, High Dose Seasonal PF 09/16/2017, 06/22/2018, 08/10/2022   Influenza-Unspecified 08/07/2021   Janssen (J&J) SARS-COV-2 Vaccination 01/09/2020   Pneumococcal Polysaccharide-23 09/16/2017   Tdap 01/23/2023    Screening Tests Health Maintenance  Topic Date Due   FOOT EXAM  Never done   OPHTHALMOLOGY EXAM  Never done   Pneumococcal Vaccine: 50+ Years (2 of 2 - PCV) 09/16/2018   Diabetic kidney evaluation - Urine ACR  05/09/2024   INFLUENZA VACCINE  05/01/2024   HEMOGLOBIN A1C  08/08/2024   Diabetic kidney evaluation - eGFR measurement  02/05/2025   Medicare Annual Wellness (AWV)  04/24/2025   DTaP/Tdap/Td (2 - Td or Tdap) 01/22/2033   Hepatitis B Vaccines  Aged Out   HPV VACCINES  Aged Out   Meningococcal B Vaccine  Aged Out   COVID-19 Vaccine  Discontinued   Hepatitis C Screening  Discontinued   Zoster Vaccines- Shingrix  Discontinued    Health Maintenance  Health Maintenance Due  Topic Date Due   FOOT EXAM  Never done   OPHTHALMOLOGY EXAM  Never done   Pneumococcal Vaccine: 50+ Years (2 of 2 - PCV) 09/16/2018   Diabetic kidney evaluation - Urine ACR  05/09/2024   Health Maintenance Items Addressed: Diabetic Foot Exam recommended, Pneumonia vaccine due. Due for eye exam.  Additional Screening:  Vision Screening: Recommended annual ophthalmology exams for early detection of glaucoma and other disorders of the eye. Would you  like a referral to an eye doctor? No    Dental Screening: Recommended annual dental exams for proper oral hygiene  Community Resource Referral / Chronic Care Management: CRR required this visit?  No   CCM required this visit?  No   Plan:    I have personally reviewed and noted the following in the patient's chart:   Medical and social history Use of alcohol, tobacco or illicit drugs  Current medications and supplements including opioid prescriptions. Patient is not currently taking opioid prescriptions. Functional ability and status Nutritional status Physical activity Advanced directives List of other physicians Hospitalizations, surgeries, and ER visits in previous 12 months Vitals Screenings to include cognitive, depression, and falls Referrals and appointments  In addition, I have reviewed and discussed with patient certain preventive protocols, quality metrics, and best practice recommendations. A written personalized care plan for preventive services as well as general preventive health recommendations were provided to patient.   Ardella FORBES Dawn, LPN   2/74/7974   After Visit Summary: (Pick Up) Due to this being a telephonic visit, with  patients personalized plan was offered to patient and patient has requested to Pick up at office.  Notes: Nothing significant to report at this time.

## 2024-04-24 NOTE — Patient Instructions (Signed)
 Mr. Ian Ramos , Thank you for taking time out of your busy schedule to complete your Annual Wellness Visit with me. I enjoyed our conversation and look forward to speaking with you again next year. I, as well as your care team,  appreciate your ongoing commitment to your health goals. Please review the following plan we discussed and let me know if I can assist you in the future. Your Game plan/ To Do List    Referrals: If you haven't heard from the office you've been referred to, please reach out to them at the phone provided.  N/a Follow up Visits: Next Medicare AWV with our clinical staff: 04/26/2025 at 3:40   Have you seen your provider in the last 6 months (3 months if uncontrolled diabetes)? Yes Next Office Visit with your provider: 05/07/2024 at 3:20  Clinician Recommendations:  Aim for 30 minutes of exercise or brisk walking, 6-8 glasses of water, and 5 servings of fruits and vegetables each day.       This is a list of the screening recommended for you and due dates:  Health Maintenance  Topic Date Due   Complete foot exam   Never done   Eye exam for diabetics  Never done   Pneumococcal Vaccine for age over 71 (2 of 2 - PCV) 09/16/2018   Yearly kidney health urinalysis for diabetes  05/09/2024   Flu Shot  05/01/2024   Hemoglobin A1C  08/08/2024   Yearly kidney function blood test for diabetes  02/05/2025   Medicare Annual Wellness Visit  04/24/2025   DTaP/Tdap/Td vaccine (2 - Td or Tdap) 01/22/2033   Hepatitis B Vaccine  Aged Out   HPV Vaccine  Aged Out   Meningitis B Vaccine  Aged Out   COVID-19 Vaccine  Discontinued   Hepatitis C Screening  Discontinued   Zoster (Shingles) Vaccine  Discontinued    Advanced directives: (In Chart) A copy of your advanced directives are scanned into your chart should your provider ever need it. Advance Care Planning is important because it:  [x]  Makes sure you receive the medical care that is consistent with your values, goals, and  preferences  [x]  It provides guidance to your family and loved ones and reduces their decisional burden about whether or not they are making the right decisions based on your wishes.  Follow the link provided in your after visit summary or read over the paperwork we have mailed to you to help you started getting your Advance Directives in place. If you need assistance in completing these, please reach out to us  so that we can help you!  See attachments for Preventive Care and Fall Prevention Tips.

## 2024-04-29 ENCOUNTER — Other Ambulatory Visit: Payer: Self-pay | Admitting: Family Medicine

## 2024-04-29 DIAGNOSIS — F321 Major depressive disorder, single episode, moderate: Secondary | ICD-10-CM

## 2024-04-29 DIAGNOSIS — R5382 Chronic fatigue, unspecified: Secondary | ICD-10-CM

## 2024-05-07 ENCOUNTER — Encounter: Payer: Self-pay | Admitting: Family Medicine

## 2024-05-07 ENCOUNTER — Ambulatory Visit: Admitting: Family Medicine

## 2024-05-07 VITALS — BP 124/68 | HR 60 | Temp 97.4°F | Ht 66.0 in | Wt 181.0 lb

## 2024-05-07 DIAGNOSIS — N1832 Chronic kidney disease, stage 3b: Secondary | ICD-10-CM

## 2024-05-07 DIAGNOSIS — E7800 Pure hypercholesterolemia, unspecified: Secondary | ICD-10-CM

## 2024-05-07 DIAGNOSIS — E1165 Type 2 diabetes mellitus with hyperglycemia: Secondary | ICD-10-CM | POA: Diagnosis not present

## 2024-05-07 DIAGNOSIS — I6521 Occlusion and stenosis of right carotid artery: Secondary | ICD-10-CM

## 2024-05-07 DIAGNOSIS — E538 Deficiency of other specified B group vitamins: Secondary | ICD-10-CM | POA: Diagnosis not present

## 2024-05-07 DIAGNOSIS — I2510 Atherosclerotic heart disease of native coronary artery without angina pectoris: Secondary | ICD-10-CM | POA: Diagnosis not present

## 2024-05-07 DIAGNOSIS — I2583 Coronary atherosclerosis due to lipid rich plaque: Secondary | ICD-10-CM | POA: Diagnosis not present

## 2024-05-07 NOTE — Progress Notes (Signed)
 Established Patient Office Visit   Subjective:  Patient ID: Ian Ramos, male    DOB: 1946/08/21  Age: 78 y.o. MRN: [id number]  Chief Complaint  Patient presents with   Medical Management of Chronic Issues    3 month follow up. Pt ate at 10am today.     HPI Encounter Diagnoses  Name Primary?   Poorly controlled type 2 diabetes mellitus (HCC) Yes   Stage 3b chronic kidney disease (HCC)    Elevated LDL cholesterol level    Coronary artery disease due to lipid rich plaque    Right-sided extracranial carotid artery stenosis    B12 deficiency    For follow-up of above today, accompanied by his daughter, Lolita.  She has been working with patient to decrease intake of simple carbohydrates and especially sugary drinks.  He continues to live independently and has neighbors on either side.  Daughter lives about 10 minutes away.  I informed them both that her clinical pharmacist had been trying to contact him on multiple occasions.  Has upcoming appointment with neurosurgery.  Denies claudication pains.   Review of Systems  Constitutional: Negative.   HENT: Negative.    Eyes:  Negative for blurred vision, discharge and redness.  Respiratory: Negative.    Cardiovascular: Negative.   Gastrointestinal:  Negative for abdominal pain.  Genitourinary: Negative.   Musculoskeletal:  Positive for neck pain. Negative for myalgias.  Skin:  Negative for rash.  Neurological:  Negative for tingling, loss of consciousness and weakness.  Endo/Heme/Allergies:  Negative for polydipsia.     Current Outpatient Medications:    aspirin  EC 81 MG tablet, Take 81 mg by mouth daily., Disp: , Rfl:    atorvastatin  (LIPITOR ) 80 MG tablet, Take 1 tablet (80 mg total) by mouth daily., Disp: 90 tablet, Rfl: 1   B Complex Vitamins (B COMPLEX-B12) TABS, Take one tablet daily, Disp: 90 tablet, Rfl: 1   clopidogrel  (PLAVIX ) 75 MG tablet, TAKE 1 TABLET(75 MG) BY MOUTH DAILY, Disp: 90 tablet, Rfl: 1   FEROSUL  325 (65 Fe) MG tablet, TAKE 1 TABLET BY MOUTH EVERY OTHER DAY, Disp: 45 tablet, Rfl: 2   gabapentin  (NEURONTIN ) 100 MG capsule, TAKE 2 CAPSULES(200 MG) BY MOUTH AT BEDTIME, Disp: 180 capsule, Rfl: 1   JARDIANCE  25 MG TABS tablet, TAKE 1 TABLET(25 MG) BY MOUTH DAILY BEFORE BREAKFAST, Disp: 30 tablet, Rfl: 5   metFORMIN  (GLUCOPHAGE -XR) 500 MG 24 hr tablet, Take 1 tablet (500 mg total) by mouth 2 (two) times daily with a meal., Disp: 180 tablet, Rfl: 2   metoprolol  tartrate (LOPRESSOR ) 25 MG tablet, TAKE 1 TABLET(25 MG) BY MOUTH TWICE DAILY, Disp: 180 tablet, Rfl: 1   sildenafil  (REVATIO ) 20 MG tablet, TAKE 3-5 TABLETS BY MOUTH DAILY 45 MINUTES PRIOR, Disp: 25 tablet, Rfl: 2   venlafaxine  XR (EFFEXOR -XR) 150 MG 24 hr capsule, TAKE 1 CAPSULE(150 MG) BY MOUTH DAILY WITH BREAKFAST, Disp: 90 capsule, Rfl: 0   Objective:     BP 124/68 (BP Location: Right Arm, Patient Position: Sitting, Cuff Size: Normal)   Pulse 60   Temp (!) 97.4 F (36.3 C) (Temporal)   Ht 5' 6 (1.676 m)   Wt 181 lb (82.1 kg)   SpO2 98%   BMI 29.21 kg/m    Physical Exam Constitutional:      General: He is not in acute distress.    Appearance: Normal appearance. He is not ill-appearing, toxic-appearing or diaphoretic.  HENT:     Head: Normocephalic and  atraumatic.     Right Ear: External ear normal.     Left Ear: External ear normal.  Eyes:     General: No scleral icterus.       Right eye: No discharge.        Left eye: No discharge.     Extraocular Movements: Extraocular movements intact.     Conjunctiva/sclera: Conjunctivae normal.  Pulmonary:     Effort: Pulmonary effort is normal. No respiratory distress.  Skin:    General: Skin is warm and dry.  Neurological:     Mental Status: He is alert and oriented to person, place, and time.  Psychiatric:        Mood and Affect: Mood normal.        Behavior: Behavior normal.    Diabetic Foot Exam - Simple   Simple Foot Form Diabetic Foot exam was performed with  the following findings: Yes 05/07/2024  3:59 PM  Visual Inspection See comments: Yes Sensation Testing Intact to touch and monofilament testing bilaterally: Yes Pulse Check See comments: Yes Comments Feet are cavus bilaterally.  There are no lesions or ulcerations.  Pulses are diminished.  Capillary refill at 3 seconds.  Denies claudication pains.      No results found for any visits on 05/07/24.    The ASCVD Risk score (Arnett DK, et al., 2019) failed to calculate for the following reasons:   Risk score cannot be calculated because patient has a medical history suggesting prior/existing ASCVD    Assessment & Plan:   Poorly controlled type 2 diabetes mellitus (HCC) -     Comprehensive metabolic panel with GFR -     Hemoglobin A1c -     Urinalysis, Routine w reflex microscopic -     Microalbumin / creatinine urine ratio -     Amb Referral to Nutrition and Diabetic Education -     AMB Referral VBCI Care Management  Stage 3b chronic kidney disease (HCC) -     CBC with Differential/Platelet -     Comprehensive metabolic panel with GFR  Elevated LDL cholesterol level -     Comprehensive metabolic panel with GFR -     Lipid panel  Coronary artery disease due to lipid rich plaque -     Lipid panel  Right-sided extracranial carotid artery stenosis -     Lipid panel  B12 deficiency -     Vitamin B12    Return in about 3 months (around 08/07/2024).  Referred patient to clinical pharmacist for assistance in diabetes control and also to nutrition and diabetic teaching.  Gave both patient's daughter, Marcia's phone number.  Would like for [facility] to attend diabetic teaching with patient.  Sounds as though he is doing better with moderating intake of simple carbohydrates and has discontinued sugary drinks.  Discussed the possibility of starting a long-acting insulin .  Information on glargine was given.  Elsie Sim Lent, MD

## 2024-05-08 LAB — LIPID PANEL
Cholesterol: 98 mg/dL (ref 0–200)
HDL: 29 mg/dL — ABNORMAL LOW (ref >39.00)
LDL Cholesterol: 45 mg/dL (ref 0–99)
NonHDL: 69.15
Total CHOL/HDL Ratio: 3
Triglycerides: 123 mg/dL (ref 0.0–149.0)
VLDL: 24.6 mg/dL (ref 0.0–40.0)

## 2024-05-08 LAB — CBC WITH DIFFERENTIAL/PLATELET
Basophils Absolute: 0.1 K/uL (ref 0.0–0.1)
Basophils Relative: 0.9 % (ref 0.0–3.0)
Eosinophils Absolute: 0.3 K/uL (ref 0.0–0.7)
Eosinophils Relative: 3.2 % (ref 0.0–5.0)
HCT: 45.9 % (ref 39.0–52.0)
Hemoglobin: 14.9 g/dL (ref 13.0–17.0)
Lymphocytes Relative: 14 % (ref 12.0–46.0)
Lymphs Abs: 1.2 K/uL (ref 0.7–4.0)
MCHC: 32.5 g/dL (ref 30.0–36.0)
MCV: 90 fl (ref 78.0–100.0)
Monocytes Absolute: 0.7 K/uL (ref 0.1–1.0)
Monocytes Relative: 8.5 % (ref 3.0–12.0)
Neutro Abs: 6.2 K/uL (ref 1.4–7.7)
Neutrophils Relative %: 73.4 % (ref 43.0–77.0)
Platelets: 184 K/uL (ref 150.0–400.0)
RBC: 5.1 Mil/uL (ref 4.22–5.81)
RDW: 14.4 % (ref 11.5–15.5)
WBC: 8.5 K/uL (ref 4.0–10.5)

## 2024-05-08 LAB — URINALYSIS, ROUTINE W REFLEX MICROSCOPIC
Bilirubin Urine: NEGATIVE
Hgb urine dipstick: NEGATIVE
Ketones, ur: NEGATIVE
Leukocytes,Ua: NEGATIVE
Nitrite: NEGATIVE
Specific Gravity, Urine: 1.01 (ref 1.000–1.030)
Total Protein, Urine: NEGATIVE
Urine Glucose: >=1000 — AB
Urobilinogen, UA: 0.2 (ref 0.0–1.0)
pH: 6 (ref 5.0–8.0)

## 2024-05-08 LAB — COMPREHENSIVE METABOLIC PANEL WITH GFR
ALT: 26 U/L (ref 0–53)
AST: 25 U/L (ref 0–37)
Albumin: 4.1 g/dL (ref 3.5–5.2)
Alkaline Phosphatase: 92 U/L (ref 39–117)
BUN: 32 mg/dL — ABNORMAL HIGH (ref 6–23)
CO2: 27 meq/L (ref 19–32)
Calcium: 9 mg/dL (ref 8.4–10.5)
Chloride: 102 meq/L (ref 96–112)
Creatinine, Ser: 1.8 mg/dL — ABNORMAL HIGH (ref 0.40–1.50)
GFR: 35.3 mL/min — ABNORMAL LOW (ref >60.00)
Glucose, Bld: 112 mg/dL — ABNORMAL HIGH (ref 70–99)
Potassium: 4.8 meq/L (ref 3.5–5.1)
Sodium: 141 meq/L (ref 135–145)
Total Bilirubin: 0.5 mg/dL (ref 0.2–1.2)
Total Protein: 6.9 g/dL (ref 6.0–8.3)

## 2024-05-08 LAB — VITAMIN B12: Vitamin B-12: 1146 pg/mL — ABNORMAL HIGH (ref 211–911)

## 2024-05-08 LAB — MICROALBUMIN / CREATININE URINE RATIO
Creatinine,U: 52.6 mg/dL
Microalb Creat Ratio: 54.6 mg/g — ABNORMAL HIGH (ref 0.0–30.0)
Microalb, Ur: 2.9 mg/dL — ABNORMAL HIGH (ref 0.0–1.9)

## 2024-05-08 LAB — HEMOGLOBIN A1C: Hgb A1c MFr Bld: 7.7 % — ABNORMAL HIGH (ref 4.6–6.5)

## 2024-05-11 ENCOUNTER — Ambulatory Visit: Payer: Self-pay | Admitting: Family Medicine

## 2024-05-13 NOTE — Telephone Encounter (Signed)
 Copied from CRM [id number]. Topic: Clinical - Lab/Test Results >> May 13, 2024  3:17 PM Carlyon D wrote: Reason for CRM: Consuelo pt friend is reaching out in regards to pt blood work. She would like some to reach out to her to go over pt blood results.  Please  call Consuelo at [phone]

## 2024-05-13 NOTE — Progress Notes (Unsigned)
 Referring Physician:  Berneta Elsie Sayre, MD [address] [place],  [place] [address]  Primary Physician:  Berneta Elsie Sayre, MD  History of Present Illness: 05/18/2024 Mr. Tadeo Besecker has a history of LBBB, dilated cardiomyopathy, CHF, CAD, HTN, right middle cerebral artery stroke, ED, DM, CKD stage 3b, chronic fatigue, depression.   History of posterior Cervical Fusion with lateral mass fixation - C1 - C6 with laminectomy on 05/23/18 for cervical myelopathy.   He did okay after surgery- had some numbness/tingling in left arm that was tolerable. He had a fall back in April landing on left side and arm and numbness/tingling left shoulder and hand are worse.   He has no neck or arm pain, but has numbness in left shoulder with tingling/numbness in left hand. He has some popping in his left index finger as well. He has no new weakness in his arms. He notes some swelling in his left hand. No balance issues. Has limited ROM of shoulders, but no pain.   He is on PLAVIX .   Tobacco use: Does not smoke.   Bowel/Bladder Dysfunction: none  Conservative measures:  Physical therapy:  has not participated in Multimodal medical therapy including regular antiinflammatories: Gabapentin  Injections: no epidural steroid injections  Past Surgery:  05/23/2018 Posterior Cervical Fusion with lateral mass fixation - C1 - C6 with laminectomy Surgeon: Louis Shove, MD  Norleen ONEIDA Mclean has no current symptoms of cervical myelopathy.  The symptoms are causing a significant impact on the patient's life.   Review of Systems:  A 10 point review of systems is negative, except for the pertinent positives and negatives detailed in the HPI.  Past Medical History: Past Medical History:  Diagnosis Date   Arthritis    CAD in native artery    a. cath 06/2018 - Eccentric 75% proximal LAD; 75% OM2; occluded RV with collaterals   CKD (chronic kidney disease), stage III (HCC)    MI  (myocardial infarction) (HCC)    Neuromuscular disorder (HCC)    Stroke [facility])     Past Surgical History: Past Surgical History:  Procedure Laterality Date   CAROTID PTA/STENT INTERVENTION N/A 06/27/2018   Procedure: CAROTID PTA/STENT INTERVENTION;  Surgeon: Harvey Carlin BRAVO, MD;  Location: [facility] INVASIVE CV LAB;  Service: Cardiovascular;  Laterality: N/A;   LEFT HEART CATH AND CORONARY ANGIOGRAPHY N/A 06/05/2018   Procedure: LEFT HEART CATH AND CORONARY ANGIOGRAPHY;  Surgeon: [place]az[place], Peter M, MD;  Location: [facility] INVASIVE CV LAB;  Service: Cardiovascular;  Laterality: N/A;   POSTERIOR CERVICAL FUSION/FORAMINOTOMY N/A 05/23/2018   Procedure: Posterior Cervical Fusion with lateral mass fixation - C1 - C6 with laminectomy;  Surgeon: Louis Shove, MD;  Location: [facility] OR;  Service: Neurosurgery;  Laterality: N/A;    Allergies: Allergies as of 05/18/2024 - Review Complete 05/18/2024  Allergen Reaction Noted   Cymbalta  [duloxetine  hcl] Nausea And Vomiting 04/05/2020   Semaglutide  Nausea And Vomiting 06/10/2023    Medications: Outpatient Encounter Medications as of 05/18/2024  Medication Sig   aspirin  EC 81 MG tablet Take 81 mg by mouth daily.   atorvastatin  (LIPITOR ) 80 MG tablet Take 1 tablet (80 mg total) by mouth daily.   B Complex Vitamins (B COMPLEX-B12) TABS Take one tablet daily   clopidogrel  (PLAVIX ) 75 MG tablet TAKE 1 TABLET(75 MG) BY MOUTH DAILY   FEROSUL 325 (65 Fe) MG tablet TAKE 1 TABLET BY MOUTH EVERY OTHER DAY   gabapentin  (NEURONTIN ) 100 MG capsule TAKE 2 CAPSULES(200 MG) BY MOUTH AT BEDTIME  JARDIANCE  25 MG TABS tablet TAKE 1 TABLET(25 MG) BY MOUTH DAILY BEFORE BREAKFAST   metFORMIN  (GLUCOPHAGE -XR) 500 MG 24 hr tablet Take 1 tablet (500 mg total) by mouth 2 (two) times daily with a meal.   metoprolol  tartrate (LOPRESSOR ) 25 MG tablet TAKE 1 TABLET(25 MG) BY MOUTH TWICE DAILY   venlafaxine  XR (EFFEXOR -XR) 150 MG 24 hr capsule TAKE 1 CAPSULE(150 MG) BY MOUTH DAILY WITH BREAKFAST    sildenafil  (REVATIO ) 20 MG tablet TAKE 3-5 TABLETS BY MOUTH DAILY 45 MINUTES PRIOR (Patient not taking: Reported on 05/18/2024)   No facility-administered encounter medications on file as of 05/18/2024.    Social History: Social History   Tobacco Use   Smoking status: Never   Smokeless tobacco: Current    Types: Chew  Vaping Use   Vaping status: Never Used  Substance Use Topics   Alcohol use: No   Drug use: No    Family Medical History: Family History  Problem Relation Age of Onset   Hypertension Other    Heart failure Mother    CAD Father        s/p CABG in his 9s   Heart failure Father     Physical Examination: Vitals:   05/18/24 1412  BP: 110/70    General: Patient is well developed, well nourished, calm, collected, and in no apparent distress. Attention to examination is appropriate.  Respiratory: Patient is breathing without any difficulty.   NEUROLOGICAL:     Awake, alert, oriented to person, place, and time.  Speech is clear and fluent. Fund of knowledge is appropriate.   Cranial Nerves: Pupils equal round and reactive to light.  Facial tone is symmetric.    Well healed posterior cervical incision.   He has limited ROM of both shoulders to about 90 degrees of forward flexion/abduction with no pain.   No abnormal lesions on exposed skin.   Strength: Side Biceps Triceps Deltoid Interossei Grip Wrist Ext. Wrist Flex.  R 5 5 5 5 5 5 5   L 5 5 5 5 5 5 5    Side Iliopsoas Quads Hamstring PF DF EHL  R 5 5 5 5 5 5   L 5 5 5 5 5 5    Reflexes are 2+ and symmetric at the biceps, brachioradialis, patella.     Hoffman's is absent.  Clonus is not present.    Bilateral upper and lower extremity sensation is intact to light touch.     He has tenderness at PIP joint of left index finger with popping at that area on flexion. He can make a full fist. No gross swelling noted. No triggering of the finger. No tenderness over A1 pulley.   Gait is slow.    Medical  Decision Making  Imaging: MRI cervical spine dated 03/25/24:  FINDINGS: Alignment: Reversal of the normal cervical lordosis. No significant listhesis.   Vertebrae: Prior posterior decompression and fusion from C1-C6. Partial interbody ankylosis from C2-C7. No fracture or suspicious marrow lesion.   Cord: Unchanged T2 hyperintensity and volume loss at C2 and C4-5.   Posterior Fossa, vertebral arteries, paraspinal tissues: Chronically abnormal appearance of the left cervical ICA and common carotid artery suggestive of chronic occlusion.   Disc levels:   C2-3: Posterior decompression and fusion with widely patent spinal canal. Mild right and moderate left neural foraminal stenosis due to uncovertebral spurring and facet hypertrophy.   C3-4: Posterior decompression and fusion with widely patent spinal canal. Moderate to severe bilateral neural foraminal stenosis.   C4-5:  Posterior decompression and fusion with widely patent spinal canal. Moderate to severe right and severe left neural foraminal stenosis.   C5-6: Posterior decompression and fusion with widely patent spinal canal. Severe right and moderate left neural foraminal stenosis.   C6-7: A broad-based posterior disc osteophyte complex and facet hypertrophy result in moderate to severe bilateral neural foraminal stenosis without significant spinal stenosis.   C7-T1: A broad-based posterior disc osteophyte complex results in moderate to severe bilateral neural foraminal stenosis without significant spinal stenosis.   Widespread neural foraminal stenosis is similar to the comparison studies.   IMPRESSION: 1. Prior C1-C6 posterior decompression and fusion with widely patent spinal canal. 2. Unchanged myelomalacia at C2 and C4-5. 3. Similar appearance of widespread advanced neural foraminal stenosis as above.     Electronically Signed   By: Dasie Hamburg M.D.   On: 04/03/2024 13:21   Cervical xrays dated 05/02/22:    FINDINGS: Cervical spine posterior fusion hardware is again seen from the level of C1 through C7. Spinal alignment is normal. There is no evidence for hardware loosening or acute fracture. There is stable intervertebral disc space narrowing and endplate osteophytes throughout the cervical spine compatible with degenerative change. There is no prevertebral soft tissue swelling. Vascular stent is noted on the right.   IMPRESSION: 1. No acute bony abnormality of the cervical spine. 2. Stable posterior fusion hardware and multilevel degenerative changes.     Electronically Signed   By: Greig Pique M.D.   On: 05/02/2022 20:57  I have personally reviewed the images and agree with the above interpretation.  Assessment and Plan: Mr. Gaunt has a history of posterior Cervical Fusion with lateral mass fixation - C1 - C6 with laminectomy on 05/23/18 for cervical myelopathy.   He did okay after surgery- had some numbness/tingling in left arm that was tolerable. He had a fall back in April landing on left side and arm-  numbness/tingling left shoulder and hand are worse.   He has no neck or arm pain, but has numbness in left shoulder with tingling/numbness in left hand. He has some popping in his left index finger as well. He has no new weakness in his arms. No balance issues.   Fusion looks good on his xrays. He has no central stenosis. He has multilevel foraminal stenosis.   He has limited ROM of both shoulders with no pain. Has popping at PIP joint of left index finger.   Treatment options discussed with patient and following plan made:   - Recommend EMG/NCS of bilateral upper extremities at [facility] to further evaluate numbness and tingling.  - Recommend referral to [facility] in [place] for popping in right index finger.  - He wants to think about above options and will let me know. Will call him in 2 weeks to see if wants to proceed with either or both of them.   I  spent a total of 30 minutes in face-to-face and non-face-to-face activities related to this patient's care today including review of outside records, review of imaging, review of symptoms, physical exam, discussion of differential diagnosis, discussion of treatment options, and documentation.   Thank you for involving me in the care of this patient.   Glade Boys PA-C Dept. of Neurosurgery

## 2024-05-13 NOTE — Telephone Encounter (Signed)
 Called number left and spoke to [place], advised of results/recommendations and gave her the number to diabetic/nutrition education [phone]. Dm/cma

## 2024-05-14 NOTE — Telephone Encounter (Unsigned)
 Copied from CRM [id number]. Topic: General - Other >> May 13, 2024  4:25 PM Ian Ramos wrote: Reason for CRM: patient called in stated he saw Dr. Berneta last Thursday where he was told he needed Ramos diet change. He is requesting more info regarding the diet change

## 2024-05-18 ENCOUNTER — Encounter: Payer: Self-pay | Admitting: Orthopedic Surgery

## 2024-05-18 ENCOUNTER — Ambulatory Visit: Admitting: Orthopedic Surgery

## 2024-05-18 VITALS — BP 110/70 | Ht 67.0 in | Wt 180.1 lb

## 2024-05-18 DIAGNOSIS — Z981 Arthrodesis status: Secondary | ICD-10-CM

## 2024-05-18 DIAGNOSIS — W19XXXD Unspecified fall, subsequent encounter: Secondary | ICD-10-CM

## 2024-05-18 DIAGNOSIS — M79645 Pain in left finger(s): Secondary | ICD-10-CM | POA: Diagnosis not present

## 2024-05-18 DIAGNOSIS — R200 Anesthesia of skin: Secondary | ICD-10-CM | POA: Diagnosis not present

## 2024-05-18 DIAGNOSIS — R202 Paresthesia of skin: Secondary | ICD-10-CM | POA: Diagnosis not present

## 2024-05-18 NOTE — Patient Instructions (Signed)
 It was so nice to see you today. Thank you so much for coming in.    Your neck xrays and MRI look okay. Your spinal cord has good room and your surgery looks good. You have some wear and tear in your neck, but I don't think this is causing the numbness/tingling in your left arm and hand.   I want you to see ortho at the [facility] in [facility] for evaluation of the pain and popping on your left hand. Let me know if you want to see them and I will put in a referral. I don't think this has anything to do with your neck.   I want to get an EMG (nerve conduction test) to look into the numbness and tingling in your left shoulder and arm. If you decide to do this, we can get it done at [facility] in [place].   Think about these options and we will call you in 2 weeks to check on you.   Please do not hesitate to call if you have any questions or concerns. You can also message me in MyChart.   Glade Boys PA-C [phone]     The physicians and staff at [facility] at [facility] are committed to providing excellent care. You may receive a survey asking for feedback about your experience at our office. We value you your feedback and appreciate you taking the time to to fill it out. The [facility] leadership team is also available to discuss your experience in person, feel free to contact us  [phone].

## 2024-05-28 ENCOUNTER — Telehealth: Payer: Self-pay

## 2024-05-28 NOTE — Progress Notes (Signed)
 Complex Care Management Note  Care Guide Note 05/28/2024 Name: Ian Ramos MRN: [id number] DOB: 05/08/1946  Ian Ramos is a 78 y.o. year old male who sees Berneta Elsie Sayre, MD for primary care. I reached out to Ian Ramos by phone today to offer complex care management services.  [name] was given information about Complex Care Management services today including:   The Complex Care Management services include support from the care team which includes your Nurse Care Manager, Clinical Social Worker, or Pharmacist.  The Complex Care Management team is here to help remove barriers to the health concerns and goals most important to you. Complex Care Management services are voluntary, and the patient may decline or stop services at any time by request to their care team member.   Complex Care Management Consent Status: Patient agreed to services and verbal consent obtained.   Follow up plan:  Face to Face appointment with complex care management team member scheduled for:  06/16/24 at 4:00 p.m.   Encounter Outcome:  Patient Scheduled  Dreama [facility]  [facility], [facility] VBCI Assistant Direct Dial: [phone]  Fax: [phone]

## 2024-06-01 ENCOUNTER — Telehealth: Payer: Self-pay | Admitting: Orthopedic Surgery

## 2024-06-01 DIAGNOSIS — R200 Anesthesia of skin: Secondary | ICD-10-CM

## 2024-06-01 DIAGNOSIS — Z981 Arthrodesis status: Secondary | ICD-10-CM

## 2024-06-01 NOTE — Telephone Encounter (Addendum)
 I saw him a few weeks ago and discussed EMG/NCS of his arms to evaluate left arm numbness/tingling.   Can do test at [facility] in [place].  I also wanted him to see [facility] in [facility] for his right index finger.   Please call to see if he wants to schedule EMG and/or ortho referral.   Thanks!

## 2024-06-02 NOTE — Telephone Encounter (Signed)
 Both request have been sent

## 2024-06-02 NOTE — Telephone Encounter (Signed)
 Referral sent to [facility] for EMG. They should call him to schedule. Will schedule f/u with me once I have the results back.   Referral to [facility] in [place] for his left shoulder. They should call him.   Please let him know.   Thanks.

## 2024-06-02 NOTE — Telephone Encounter (Signed)
 Left voicemail for patient to call back.

## 2024-06-02 NOTE — Addendum Note (Signed)
 Addended byBETHA HILMA HASTINGS on: 06/02/2024 11:28 AM   Modules accepted: Orders

## 2024-06-03 ENCOUNTER — Ambulatory Visit: Admitting: Dietician

## 2024-06-10 ENCOUNTER — Other Ambulatory Visit

## 2024-06-15 ENCOUNTER — Encounter: Payer: Self-pay | Admitting: Skilled Nursing Facility1

## 2024-06-15 ENCOUNTER — Encounter: Attending: Family Medicine | Admitting: Skilled Nursing Facility1

## 2024-06-15 DIAGNOSIS — E119 Type 2 diabetes mellitus without complications: Secondary | ICD-10-CM | POA: Diagnosis not present

## 2024-06-15 NOTE — Progress Notes (Unsigned)
 Urinating every 2-3 hours  DM medications: Jardiance  Metformin 

## 2024-06-16 ENCOUNTER — Ambulatory Visit

## 2024-06-16 DIAGNOSIS — F321 Major depressive disorder, single episode, moderate: Secondary | ICD-10-CM

## 2024-06-16 DIAGNOSIS — E1165 Type 2 diabetes mellitus with hyperglycemia: Secondary | ICD-10-CM | POA: Diagnosis not present

## 2024-06-16 DIAGNOSIS — E861 Hypovolemia: Secondary | ICD-10-CM | POA: Diagnosis not present

## 2024-06-17 NOTE — Progress Notes (Unsigned)
   06/17/2024 Name: Ian Ramos MRN: [id number] DOB: 09-Feb-1946  No chief complaint on file.   {Visit Ubez:73349}   Subjective:  Care Team: Primary Care Provider: Berneta Elsie Sayre, MD ; Next Scheduled Visit: *** {careteamprovider:27366}  Medication Access/Adherence  Current Pharmacy:  [id number] [place], [place] - [address] AT [facility] [address] [place] [address] Phone: [phone] Fax: [phone]   Patient reports affordability concerns with their medications: {YES/NO:21197} Patient reports access/transportation concerns to their pharmacy: {YES/NO:21197} Patient reports adherence concerns with their medications:  {YES/NO:21197} ***   {Pharmacy S/O Choices:26420}   Objective:  Lab Results  Component Value Date   HGBA1C 7.7 (H) 05/07/2024    Lab Results  Component Value Date   CREATININE 1.80 (H) 05/07/2024   BUN 32 (H) 05/07/2024   NA 141 05/07/2024   K 4.8 05/07/2024   CL 102 05/07/2024   CO2 27 05/07/2024    Lab Results  Component Value Date   CHOL 98 05/07/2024   HDL 29.00 (L) 05/07/2024   LDLCALC 45 05/07/2024   LDLDIRECT 54.0 02/06/2024   TRIG 123.0 05/07/2024   CHOLHDL 3 05/07/2024    Medications Reviewed Today     Reviewed by Deanna Channing LABOR, RPH (Pharmacist) on 06/16/24 at 1614  Med List Status: <None>   Medication Order Taking? Sig Documenting Provider Last Dose Status Informant  aspirin  EC 81 MG tablet [id number] Yes Take 81 mg by mouth daily. [provider]  Active   atorvastatin  (LIPITOR ) 80 MG tablet [id number] Yes Take 1 tablet (80 mg total) by mouth daily. Berneta Elsie Sayre, MD  Active   B Complex Vitamins (B COMPLEX-B12) TABS [id number] Yes Take one tablet daily Berneta Elsie Sayre, MD  Active Spouse/Significant Other  clopidogrel  (PLAVIX ) 75 MG tablet [id number] Yes TAKE 1 TABLET(75 MG) BY MOUTH DAILY Berneta Elsie Sayre, MD  Active   FEROSUL 325  (65 Fe) MG tablet [id number] Yes TAKE 1 TABLET BY MOUTH EVERY OTHER DAY Berneta Elsie Sayre, MD  Active   gabapentin  (NEURONTIN ) 100 MG capsule [id number] Yes TAKE 2 CAPSULES(200 MG) BY MOUTH AT BEDTIME Berneta Elsie Sayre, MD  Active   JARDIANCE  25 MG TABS tablet [id number] Yes TAKE 1 TABLET(25 MG) BY MOUTH DAILY BEFORE BREAKFAST Berneta Elsie Sayre, MD  Active   metFORMIN  (GLUCOPHAGE -XR) 500 MG 24 hr tablet [id number] Yes Take 1 tablet (500 mg total) by mouth 2 (two) times daily with a meal. Berneta Elsie Sayre, MD  Active   metoprolol  tartrate (LOPRESSOR ) 25 MG tablet [id number] Yes TAKE 1 TABLET(25 MG) BY MOUTH TWICE DAILY Berneta Elsie Sayre, MD  Active   sildenafil  (REVATIO ) 20 MG tablet [id number]  TAKE 3-5 TABLETS BY MOUTH DAILY 45 MINUTES PRIOR  Patient not taking: Reported on 05/18/2024   Berneta Elsie Sayre, MD  Active   venlafaxine  XR (EFFEXOR -XR) 150 MG 24 hr capsule [id number] Yes TAKE 1 CAPSULE(150 MG) BY MOUTH DAILY WITH BREAKFAST Berneta Elsie Sayre, MD  Active               Assessment/Plan:   {Pharmacy A/P Choices:26421}  Follow Up Plan: ***  ***

## 2024-06-22 ENCOUNTER — Other Ambulatory Visit: Payer: Self-pay | Admitting: Family Medicine

## 2024-06-23 DIAGNOSIS — K080 Exfoliation of teeth due to systemic causes: Secondary | ICD-10-CM | POA: Diagnosis not present

## 2024-06-30 ENCOUNTER — Other Ambulatory Visit: Payer: Self-pay | Admitting: Family Medicine

## 2024-06-30 DIAGNOSIS — D649 Anemia, unspecified: Secondary | ICD-10-CM

## 2024-07-01 NOTE — Progress Notes (Signed)
 Ian Ramos                                          MRN: [id number]   07/01/2024   The [facility] Quality Team Specialist reviewed this patient medical record for the purposes of chart review for care gap closure. The following were reviewed: abstraction for care gap closure-kidney health evaluation for diabetes:eGFR  and uACR.    [facility] Quality Team

## 2024-07-06 ENCOUNTER — Other Ambulatory Visit: Payer: Self-pay | Admitting: Family Medicine

## 2024-07-06 DIAGNOSIS — R5382 Chronic fatigue, unspecified: Secondary | ICD-10-CM

## 2024-07-06 DIAGNOSIS — F321 Major depressive disorder, single episode, moderate: Secondary | ICD-10-CM

## 2024-07-12 ENCOUNTER — Other Ambulatory Visit: Payer: Self-pay | Admitting: Family Medicine

## 2024-07-12 DIAGNOSIS — I2510 Atherosclerotic heart disease of native coronary artery without angina pectoris: Secondary | ICD-10-CM

## 2024-07-12 DIAGNOSIS — I6523 Occlusion and stenosis of bilateral carotid arteries: Secondary | ICD-10-CM

## 2024-07-14 DIAGNOSIS — K080 Exfoliation of teeth due to systemic causes: Secondary | ICD-10-CM | POA: Diagnosis not present

## 2024-07-27 ENCOUNTER — Other Ambulatory Visit: Payer: Self-pay | Admitting: Family Medicine

## 2024-07-27 DIAGNOSIS — M542 Cervicalgia: Secondary | ICD-10-CM

## 2024-07-31 ENCOUNTER — Other Ambulatory Visit: Payer: Self-pay | Admitting: Family Medicine

## 2024-07-31 DIAGNOSIS — N1832 Chronic kidney disease, stage 3b: Secondary | ICD-10-CM

## 2024-07-31 DIAGNOSIS — R7303 Prediabetes: Secondary | ICD-10-CM

## 2024-08-05 ENCOUNTER — Encounter: Payer: Self-pay | Admitting: Family Medicine

## 2024-08-06 ENCOUNTER — Encounter: Payer: Self-pay | Admitting: Family Medicine

## 2024-08-06 ENCOUNTER — Ambulatory Visit: Admitting: Family Medicine

## 2024-08-06 VITALS — BP 103/63 | HR 66 | Temp 98.6°F | Ht 67.0 in | Wt 178.0 lb

## 2024-08-06 DIAGNOSIS — E611 Iron deficiency: Secondary | ICD-10-CM | POA: Diagnosis not present

## 2024-08-06 DIAGNOSIS — E1122 Type 2 diabetes mellitus with diabetic chronic kidney disease: Secondary | ICD-10-CM | POA: Diagnosis not present

## 2024-08-06 DIAGNOSIS — R4582 Worries: Secondary | ICD-10-CM | POA: Diagnosis not present

## 2024-08-06 DIAGNOSIS — Z23 Encounter for immunization: Secondary | ICD-10-CM | POA: Diagnosis not present

## 2024-08-06 DIAGNOSIS — N1832 Chronic kidney disease, stage 3b: Secondary | ICD-10-CM | POA: Diagnosis not present

## 2024-08-06 MED ORDER — IRON (FERROUS SULFATE) 325 (65 FE) MG PO TABS
325.0000 mg | ORAL_TABLET | ORAL | 2 refills | Status: AC
Start: 2024-08-06 — End: ?

## 2024-08-06 NOTE — Progress Notes (Signed)
 Established Patient Office Visit   Subjective:  Patient ID: Ian Ramos, male    DOB: [birth year]  Age: 78 y.o. MRN: [id number]  Chief Complaint  Patient presents with   Medical Management of Chronic Issues    3 month follow up. Pt is fasting.     HPI Encounter Diagnoses  Name Primary?   Type 2 diabetes mellitus with stage 3b chronic kidney disease, without long-term current use of insulin  (HCC) Yes   Stage 3b chronic kidney disease (HCC)    Iron deficiency    Worries    Immunization due    Follow-up of above.  Compliant with his medications.  Status post nutritional consult for diabetes.  He has improved his diet.  His daughter, Ludivina could not be with him today.  She is not feeling well.  Has not had an appointment with nephrology yet.  Continues to complain of fatigue.  Says that prescription for iron was denied.   Review of Systems  Constitutional:  Positive for malaise/fatigue.  HENT: Negative.    Eyes:  Negative for blurred vision, discharge and redness.  Respiratory: Negative.    Cardiovascular: Negative.   Gastrointestinal:  Negative for abdominal pain.  Genitourinary: Negative.   Musculoskeletal: Negative.  Negative for myalgias.  Skin:  Negative for rash.  Neurological:  Negative for tingling, loss of consciousness and weakness.  Endo/Heme/Allergies:  Negative for polydipsia.     Current Outpatient Medications:    aspirin  EC 81 MG tablet, Take 81 mg by mouth daily., Disp: , Rfl:    atorvastatin  (LIPITOR ) 80 MG tablet, TAKE 1 TABLET(80 MG) BY MOUTH DAILY, Disp: 90 tablet, Rfl: 1   B Complex Vitamins (B COMPLEX-B12) TABS, Take one tablet daily, Disp: 90 tablet, Rfl: 1   clopidogrel  (PLAVIX ) 75 MG tablet, TAKE 1 TABLET(75 MG) BY MOUTH DAILY, Disp: 90 tablet, Rfl: 1   gabapentin  (NEURONTIN ) 100 MG capsule, TAKE 2 CAPSULES(200 MG) BY MOUTH AT BEDTIME, Disp: 180 capsule, Rfl: 1   Iron, Ferrous Sulfate , 325 (65 Fe) MG TABS, Take 325 mg by mouth every other day.,  Disp: 45 tablet, Rfl: 2   JARDIANCE  25 MG TABS tablet, TAKE 1 TABLET(25 MG) BY MOUTH DAILY BEFORE BREAKFAST, Disp: 30 tablet, Rfl: 5   metFORMIN  (GLUCOPHAGE -XR) 500 MG 24 hr tablet, Take 1 tablet (500 mg total) by mouth 2 (two) times daily with a meal., Disp: 180 tablet, Rfl: 2   metoprolol  tartrate (LOPRESSOR ) 25 MG tablet, TAKE 1 TABLET(25 MG) BY MOUTH TWICE DAILY, Disp: 180 tablet, Rfl: 1   venlafaxine  XR (EFFEXOR -XR) 150 MG 24 hr capsule, TAKE 1 CAPSULE(150 MG) BY MOUTH DAILY WITH BREAKFAST, Disp: 90 capsule, Rfl: 0   Objective:     BP 103/63 (BP Location: Right Arm, Patient Position: Sitting, Cuff Size: Normal)   Pulse 66   Temp 98.6 F (37 C) (Temporal)   Ht 5' 7 (1.702 m)   Wt 178 lb (80.7 kg)   SpO2 94%   BMI 27.88 kg/m  Wt Readings from Last 3 Encounters:  08/06/24 178 lb (80.7 kg)  05/18/24 180 lb 2 oz (81.7 kg)  05/07/24 181 lb (82.1 kg)      Physical Exam   No results found for any visits on 08/06/24.    The ASCVD Risk score (Arnett DK, et al., 2019) failed to calculate for the following reasons:   The 2019 ASCVD risk score is only valid for ages 68 to 75   Risk score cannot be calculated because  patient has a medical history suggesting prior/existing ASCVD    Assessment & Plan:   Type 2 diabetes mellitus with stage 3b chronic kidney disease, without long-term current use of insulin  (HCC) -     Basic metabolic panel with GFR -     Hemoglobin A1c  Stage 3b chronic kidney disease (HCC) -     Basic metabolic panel with GFR -     Multiple Myeloma Panel (SPEP&IFE w/QIG) -     Ambulatory referral to Nephrology  Iron deficiency -     CBC with Differential/Platelet -     Iron, TIBC and Ferritin Panel -     Iron (Ferrous Sulfate ); Take 325 mg by mouth every other day.  Dispense: 45 tablet; Refill: 2  Worries -     TSH  Immunization due -     Pneumococcal conjugate vaccine 20-valent -     Flu vaccine HIGH DOSE PF(Fluzone Trivalent)    Return in about 3  months (around 11/06/2024).  Referral again to nephrology.  Regarding fatigue, well check TSH and iron levels today.  B12 and vitamin D  levels look good.  Diabetes is under better control.  Elsie Sim Lent, MD

## 2024-08-07 ENCOUNTER — Ambulatory Visit: Payer: Self-pay | Admitting: Family Medicine

## 2024-08-07 LAB — BASIC METABOLIC PANEL WITH GFR
BUN: 35 mg/dL — ABNORMAL HIGH (ref 6–23)
CO2: 25 meq/L (ref 19–32)
Calcium: 9.3 mg/dL (ref 8.4–10.5)
Chloride: 104 meq/L (ref 96–112)
Creatinine, Ser: 1.73 mg/dL — ABNORMAL HIGH (ref 0.40–1.50)
GFR: 36.95 mL/min — ABNORMAL LOW (ref >60.00)
Glucose, Bld: 115 mg/dL — ABNORMAL HIGH (ref 70–99)
Potassium: 5.2 meq/L — ABNORMAL HIGH (ref 3.5–5.1)
Sodium: 141 meq/L (ref 135–145)

## 2024-08-07 LAB — CBC WITH DIFFERENTIAL/PLATELET
Basophils Absolute: 0.1 K/uL (ref 0.0–0.1)
Basophils Relative: 0.6 % (ref 0.0–3.0)
Eosinophils Absolute: 0.3 K/uL (ref 0.0–0.7)
Eosinophils Relative: 3.8 % (ref 0.0–5.0)
HCT: 44.5 % (ref 39.0–52.0)
Hemoglobin: 14.8 g/dL (ref 13.0–17.0)
Lymphocytes Relative: 15 % (ref 12.0–46.0)
Lymphs Abs: 1.2 K/uL (ref 0.7–4.0)
MCHC: 33.3 g/dL (ref 30.0–36.0)
MCV: 90.2 fl (ref 78.0–100.0)
Monocytes Absolute: 0.7 K/uL (ref 0.1–1.0)
Monocytes Relative: 9.2 % (ref 3.0–12.0)
Neutro Abs: 5.7 K/uL (ref 1.4–7.7)
Neutrophils Relative %: 71.4 % (ref 43.0–77.0)
Platelets: 187 K/uL (ref 150.0–400.0)
RBC: 4.94 Mil/uL (ref 4.22–5.81)
RDW: 14.4 % (ref 11.5–15.5)
WBC: 8 K/uL (ref 4.0–10.5)

## 2024-08-07 LAB — IRON,TIBC AND FERRITIN PANEL
%SAT: 22 % (ref 20–48)
Ferritin: 77 ng/mL (ref 24–380)
Iron: 66 ug/dL (ref 50–180)
TIBC: 297 ug/dL (ref 250–425)

## 2024-08-07 LAB — HEMOGLOBIN A1C: Hgb A1c MFr Bld: 7.8 % — ABNORMAL HIGH (ref 4.6–6.5)

## 2024-08-07 LAB — TSH: TSH: 1.87 u[IU]/mL (ref 0.35–5.50)

## 2024-08-13 LAB — MULTIPLE MYELOMA PANEL, SERUM
Albumin SerPl Elph-Mcnc: 3.3 g/dL (ref 2.9–4.4)
Albumin/Glob SerPl: 1.1 (ref 0.7–1.7)
Alpha 1: 0.3 g/dL (ref 0.0–0.4)
Alpha2 Glob SerPl Elph-Mcnc: 1 g/dL (ref 0.4–1.0)
B-Globulin SerPl Elph-Mcnc: 1 g/dL (ref 0.7–1.3)
Gamma Glob SerPl Elph-Mcnc: 0.9 g/dL (ref 0.4–1.8)
Globulin, Total: 3.1 g/dL (ref 2.2–3.9)
IgG (Immunoglobin G), Serum: 923 mg/dL (ref 603–1613)
IgM (Immunoglobulin M), Srm: 89 mg/dL (ref 15–143)
Immunoglobulin A, (IgA) QN, Serum: 178 mg/dL (ref 61–437)
Total Protein: 6.4 g/dL (ref 6.0–8.5)

## 2024-08-24 DIAGNOSIS — E1122 Type 2 diabetes mellitus with diabetic chronic kidney disease: Secondary | ICD-10-CM | POA: Diagnosis not present

## 2024-08-24 DIAGNOSIS — N1832 Chronic kidney disease, stage 3b: Secondary | ICD-10-CM | POA: Diagnosis not present

## 2024-08-24 DIAGNOSIS — E7800 Pure hypercholesterolemia, unspecified: Secondary | ICD-10-CM | POA: Diagnosis not present

## 2024-08-24 DIAGNOSIS — E875 Hyperkalemia: Secondary | ICD-10-CM | POA: Diagnosis not present

## 2024-08-25 ENCOUNTER — Other Ambulatory Visit: Payer: Self-pay | Admitting: Internal Medicine

## 2024-08-25 DIAGNOSIS — N1832 Chronic kidney disease, stage 3b: Secondary | ICD-10-CM

## 2024-09-04 ENCOUNTER — Ambulatory Visit
Admission: RE | Admit: 2024-09-04 | Discharge: 2024-09-04 | Disposition: A | Source: Ambulatory Visit | Attending: Internal Medicine | Admitting: Internal Medicine

## 2024-09-04 DIAGNOSIS — N1832 Chronic kidney disease, stage 3b: Secondary | ICD-10-CM | POA: Diagnosis not present

## 2024-09-16 ENCOUNTER — Telehealth: Payer: Self-pay | Admitting: Family Medicine

## 2024-09-16 NOTE — Telephone Encounter (Signed)
 Copied from CRM #[id number]. Topic: Clinical - Lab/Test Results >> Sep 16, 2024 12:20 PM Drema MATSU wrote: Reason for CRM: Patient daughter stated that the kidney doctor ordered an ultrasound and she has not heard anything from them. She is not sure what's going on or who is suppose to review them.

## 2024-09-16 NOTE — Telephone Encounter (Signed)
 Called patients daughter and she will reach back out to urology to get the results. Dm/cma

## 2024-10-24 ENCOUNTER — Other Ambulatory Visit: Payer: Self-pay | Admitting: Family Medicine

## 2024-10-24 DIAGNOSIS — E1165 Type 2 diabetes mellitus with hyperglycemia: Secondary | ICD-10-CM

## 2024-10-25 ENCOUNTER — Other Ambulatory Visit: Payer: Self-pay | Admitting: Family Medicine

## 2024-10-25 DIAGNOSIS — F321 Major depressive disorder, single episode, moderate: Secondary | ICD-10-CM

## 2024-10-25 DIAGNOSIS — R5382 Chronic fatigue, unspecified: Secondary | ICD-10-CM

## 2024-11-12 ENCOUNTER — Ambulatory Visit: Admitting: Family Medicine

## 2025-04-26 ENCOUNTER — Ambulatory Visit
